# Patient Record
Sex: Female | Born: 1950
Health system: Southern US, Community
[De-identification: ages and names within clinical notes are randomized; demographics above are authoritative.]

## PROBLEM LIST (undated history)

## (undated) DIAGNOSIS — J449 Chronic obstructive pulmonary disease, unspecified: Secondary | ICD-10-CM

## (undated) DIAGNOSIS — I10 Essential (primary) hypertension: Secondary | ICD-10-CM

## (undated) DIAGNOSIS — K759 Inflammatory liver disease, unspecified: Secondary | ICD-10-CM

## (undated) DIAGNOSIS — Z860100 Personal history of colon polyps, unspecified: Secondary | ICD-10-CM

## (undated) DIAGNOSIS — Z9889 Other specified postprocedural states: Secondary | ICD-10-CM

## (undated) DIAGNOSIS — R112 Nausea with vomiting, unspecified: Secondary | ICD-10-CM

## (undated) DIAGNOSIS — E782 Mixed hyperlipidemia: Secondary | ICD-10-CM

## (undated) DIAGNOSIS — F419 Anxiety disorder, unspecified: Secondary | ICD-10-CM

## (undated) DIAGNOSIS — T7840XA Allergy, unspecified, initial encounter: Secondary | ICD-10-CM

## (undated) DIAGNOSIS — R3915 Urgency of urination: Secondary | ICD-10-CM

## (undated) DIAGNOSIS — Z9109 Other allergy status, other than to drugs and biological substances: Secondary | ICD-10-CM

## (undated) DIAGNOSIS — K589 Irritable bowel syndrome without diarrhea: Secondary | ICD-10-CM

## (undated) DIAGNOSIS — E119 Type 2 diabetes mellitus without complications: Secondary | ICD-10-CM

## (undated) DIAGNOSIS — M1990 Unspecified osteoarthritis, unspecified site: Secondary | ICD-10-CM

## (undated) DIAGNOSIS — E785 Hyperlipidemia, unspecified: Secondary | ICD-10-CM

## (undated) DIAGNOSIS — K219 Gastro-esophageal reflux disease without esophagitis: Secondary | ICD-10-CM

## (undated) HISTORY — PX: APPENDECTOMY: SHX54

## (undated) HISTORY — PX: DILATION AND CURETTAGE OF UTERUS: SHX78

## (undated) HISTORY — DX: Irritable bowel syndrome, unspecified: K58.9

## (undated) HISTORY — PX: PARTIAL HYSTERECTOMY: SHX80

## (undated) HISTORY — DX: Mixed hyperlipidemia: E78.2

## (undated) HISTORY — PX: COLONOSCOPY: SHX174

## (undated) HISTORY — DX: Personal history of colonic polyps: Z86.010

## (undated) HISTORY — DX: Gastro-esophageal reflux disease without esophagitis: K21.9

## (undated) HISTORY — DX: Personal history of colon polyps, unspecified: Z86.0100

## (undated) HISTORY — PX: BACK SURGERY: SHX140

## (undated) HISTORY — DX: Allergy, unspecified, initial encounter: T78.40XA

## (undated) HISTORY — PX: POLYPECTOMY: SHX149

---

## 1999-11-07 ENCOUNTER — Encounter: Payer: Self-pay | Admitting: Otolaryngology

## 1999-11-07 ENCOUNTER — Encounter: Admission: RE | Admit: 1999-11-07 | Discharge: 1999-11-07 | Payer: Self-pay | Admitting: Otolaryngology

## 2000-02-13 ENCOUNTER — Other Ambulatory Visit: Admission: RE | Admit: 2000-02-13 | Discharge: 2000-02-13 | Payer: Self-pay | Admitting: Gynecology

## 2000-10-09 ENCOUNTER — Encounter: Payer: Self-pay | Admitting: Internal Medicine

## 2000-10-09 ENCOUNTER — Encounter: Admission: RE | Admit: 2000-10-09 | Discharge: 2000-10-09 | Payer: Self-pay | Admitting: Internal Medicine

## 2001-01-23 ENCOUNTER — Ambulatory Visit (HOSPITAL_COMMUNITY): Admission: RE | Admit: 2001-01-23 | Discharge: 2001-01-23 | Payer: Self-pay | Admitting: Gastroenterology

## 2009-06-01 ENCOUNTER — Encounter (INDEPENDENT_AMBULATORY_CARE_PROVIDER_SITE_OTHER): Payer: Self-pay | Admitting: *Deleted

## 2009-06-01 ENCOUNTER — Other Ambulatory Visit: Admission: RE | Admit: 2009-06-01 | Discharge: 2009-06-01 | Payer: Self-pay | Admitting: Internal Medicine

## 2009-06-07 ENCOUNTER — Encounter: Admission: RE | Admit: 2009-06-07 | Discharge: 2009-06-07 | Payer: Self-pay | Admitting: Internal Medicine

## 2009-06-16 ENCOUNTER — Encounter: Admission: RE | Admit: 2009-06-16 | Discharge: 2009-06-16 | Payer: Self-pay | Admitting: Internal Medicine

## 2009-08-29 ENCOUNTER — Ambulatory Visit: Payer: Self-pay | Admitting: Gastroenterology

## 2010-01-27 ENCOUNTER — Encounter (INDEPENDENT_AMBULATORY_CARE_PROVIDER_SITE_OTHER): Payer: Self-pay | Admitting: *Deleted

## 2010-04-07 ENCOUNTER — Encounter (INDEPENDENT_AMBULATORY_CARE_PROVIDER_SITE_OTHER): Payer: Self-pay | Admitting: *Deleted

## 2010-05-30 ENCOUNTER — Encounter (INDEPENDENT_AMBULATORY_CARE_PROVIDER_SITE_OTHER): Payer: Self-pay | Admitting: *Deleted

## 2010-05-31 ENCOUNTER — Ambulatory Visit: Payer: Self-pay | Admitting: Gastroenterology

## 2010-06-12 ENCOUNTER — Ambulatory Visit: Payer: Self-pay | Admitting: Gastroenterology

## 2010-06-14 ENCOUNTER — Encounter: Payer: Self-pay | Admitting: Gastroenterology

## 2010-08-15 NOTE — Letter (Signed)
Summary: [facility] Instructions  [facility]  [address] [place], [place] [address]   Phone: [phone]  Fax: [phone]       [name]    29-Oct-1950    MRN: [id number]        Procedure Day Dorna Bloom:  Duanne Limerick 06/12/10     Arrival Time: 10:30am     Procedure Time: 11:30am     Location of Procedure:                    _X _  [facility] (4th Floor)                        PREPARATION FOR COLONOSCOPY WITH MOVIPREP   Starting 5 days prior to your procedure  [facility] 06/07/10  do not eat nuts, seeds, popcorn, corn, beans, peas,  salads, or any raw vegetables.  Do not take any fiber supplements (e.g. Metamucil, Citrucel, and Benefiber).  THE DAY BEFORE YOUR PROCEDURE         DATE: SUNDAY 06/11/10  1.  Drink clear liquids the entire day-NO SOLID FOOD  2.  Do not drink anything colored red or purple.  Avoid juices with pulp.  No orange juice.  3.  Drink at least 64 oz. (8 glasses) of fluid/clear liquids during the day to prevent dehydration and help the prep work efficiently.  CLEAR LIQUIDS INCLUDE: Water Jello Ice Popsicles Tea (sugar ok, no milk/cream) Powdered fruit flavored drinks Coffee (sugar ok, no milk/cream) Gatorade Juice: apple, white grape, white cranberry  Lemonade Clear bullion, consomm, broth Carbonated beverages (any kind) Strained chicken noodle soup Hard Candy                             4.  In the morning, mix first dose of MoviPrep solution:    Empty 1 Pouch A and 1 Pouch B into the disposable container    Add lukewarm drinking water to the top line of the container. Mix to dissolve    Refrigerate (mixed solution should be used within 24 hrs)  5.  Begin drinking the prep at 5:00 p.m. The MoviPrep container is divided by 4 marks.   Every 15 minutes drink the solution down to the next mark (approximately 8 oz) until the full liter is complete.   6.  Follow completed prep with 16 oz of clear liquid of your choice  (Nothing red or purple).  Continue to drink clear liquids until bedtime.  7.  Before going to bed, mix second dose of MoviPrep solution:    Empty 1 Pouch A and 1 Pouch B into the disposable container    Add lukewarm drinking water to the top line of the container. Mix to dissolve    Refrigerate  THE DAY OF YOUR PROCEDURE      DATE: MONDAY 06/12/10  Beginning at  6:30a.m. (5 hours before procedure):         1. Every 15 minutes, drink the solution down to the next mark (approx 8 oz) until the full liter is complete.  2. Follow completed prep with 16 oz. of clear liquid of your choice.    3. You may drink clear liquids until 9:30am  (2 HOURS BEFORE PROCEDURE).   MEDICATION INSTRUCTIONS  Unless otherwise instructed, you should take regular prescription medications with a small sip of water   as early as possible the morning of your  procedure.  Additional medication instructions: Hold blood pressure medicine the morning of procedure if it contains a diuretic.         OTHER INSTRUCTIONS  You will need a responsible adult at least 60 years of age to accompany you and drive you home.   This person must remain in the waiting room during your procedure.  Wear loose fitting clothing that is easily removed.  Leave jewelry and other valuables at home.  However, you may wish to bring a book to read or  an iPod/MP3 player to listen to music as you wait for your procedure to start.  Remove all body piercing jewelry and leave at home.  Total time from sign-in until discharge is approximately 2-3 hours.  You should go home directly after your procedure and rest.  You can resume normal activities the  day after your procedure.  The day of your procedure you should not:   Drive   Make legal decisions   Operate machinery   Drink alcohol   Return to work  You will receive specific instructions about eating, activities and medications before you leave.    The above  instructions have been reviewed and explained to me by  Wyona Almas RN  May 31, 2010 5:09 PM     I fully understand and can verbalize these instructions _____________________________ Date _________

## 2010-08-15 NOTE — Letter (Signed)
Summary: Pre Visit Letter Revised  [facility]  [address] [place], [place] [address]   Phone: [phone]  Fax: [phone]        04/07/2010 MRN: [id number] [facility] [address] [place], [place]  [address]             Procedure Date:  05-23-10   Welcome to the Gastroenterology Division at [facility].    You are scheduled to see a nurse for your pre-procedure visit on 05-09-10 at 10:00A.M. on the 3rd floor at [facility], [address]  We ask that you try to arrive at our office 15 minutes prior to your appointment time to allow for check-in.  Please take a minute to review the attached form.  If you answer "Yes" to one or more of the questions on the first page, we ask that you call the person listed at your earliest opportunity.  If you answer "No" to all of the questions, please complete the rest of the form and bring it to your appointment.    Your nurse visit will consist of discussing your medical and surgical history, your immediate family medical history, and your medications.   If you are unable to list all of your medications on the form, please bring the medication bottles to your appointment and we will list them.  We will need to be aware of both prescribed and over the counter drugs.  We will need to know exact dosage information as well.    Please be prepared to read and sign documents such as consent forms, a financial agreement, and acknowledgement forms.  If necessary, and with your consent, a friend or relative is welcome to sit-in on the nurse visit with you.  Please bring your insurance card so that we may make a copy of it.  If your insurance requires a referral to see a specialist, please bring your referral form from your primary care physician.  No co-pay is required for this nurse visit.     If you cannot keep your appointment, please call [phone] to cancel or reschedule prior to your appointment date.  This allows  [place] the opportunity to schedule an appointment for another patient in need of care.    Thank you for choosing [facility] for your medical needs.  We appreciate the opportunity to care for you.  Please visit [place] at our website  to learn more about our practice.  Sincerely, The Gastroenterology Division

## 2010-08-15 NOTE — Miscellaneous (Signed)
Summary: [facility] PV  Clinical Lists Changes   pt not sure if she is supposed to have colon now. She is self referral.  Last colonoscopy was 4 years ago in [facility].  According to pt  did not have polpys.  She will contact facility that performed last colonoscopy and have a report of procedure sent to PCP and have that office call to schedule colon if it is time for recall.

## 2010-08-15 NOTE — Miscellaneous (Signed)
Summary: [facility] Previsit/prep  Clinical Lists Changes  Medications: Added new medication of MOVIPREP 100 GM  SOLR (PEG-KCL-NACL-NASULF-NA ASC-C) As per prep instructions. - Signed Rx of MOVIPREP 100 GM  SOLR (PEG-KCL-NACL-NASULF-NA ASC-C) As per prep instructions.;  #1 x 0;  Signed;  Entered by: Wyona Almas RN;  Authorized by: Louis Meckel MD;  Method used: Electronically to [facility] [place] [facility] [address] [phone]*, [address], [place], [place]  [address], Ph: [phone], Fax: [phone] Observations: Added new observation of NKA: T (05/31/2010 16:16)    Prescriptions: MOVIPREP 100 GM  SOLR (PEG-KCL-NACL-NASULF-NA ASC-C) As per prep instructions.  #1 x 0   Entered by:   Wyona Almas RN   Authorized by:   Louis Meckel MD   Signed by:   Wyona Almas RN on 05/31/2010   Method used:   Electronically to        [facility] [address] [phone]* (retail)       [address]       [address]       [place], [place]  [address]       Ph: [phone]       Fax: [phone]   RxID:   [id number]

## 2010-08-15 NOTE — Procedures (Signed)
Summary: Colonoscopy  Patient: [name] Note: All result statuses are Final unless otherwise noted.  Tests: (1) Colonoscopy (COL)   COL Colonoscopy           DONE     [facility]     [address]     [place], [place]  [address]           OPERATIVE PROCEDURE REPORT           PATIENT:  [name], [name]  MR#:  [id number]     BIRTHDATE:  08/28/1950, 59 yrs. old  GENDER:  female           ENDOSCOPIST:  Barbette Hair. Arlyce Dice, MD     ASSISTANT:           PROCEDURE DATE:  06/12/2010     PROCEDURE:  Colonoscopy with snare polypectomy     ASA CLASS:  Class II     INDICATIONS:  Routine Risk Screening           MEDICATIONS:   Fentanyl 100 mcg IV, Versed 10 mg IV, Benadryl 12.5     mg IV           DESCRIPTION OF PROCEDURE:   After the risks benefits and     alternatives of the procedure were thoroughly explained, informed     consent was obtained.  Digital rectal exam was performed and     revealed no abnormalities.   The LB CF-[id number] endoscope     was introduced through the anus and advanced to the cecum, which     was identified by both the appendix and ileocecal valve, without     limitations.  The quality of the prep was good, using MoviPrep.     The instrument was then slowly withdrawn as the colon was fully     examined.     <<PROCEDUREIMAGES>>           FINDINGS:  Two polyps were found in the cecum (see image1 and     image4). 2 3mm sessile polyps - removed with cold polypectomy     snare and submitted to pathology  A sessile polyp was found in the     ascending colon. It was 3 mm in size. Polyp was snared without     cautery. Retrieval was successful (see image5). snare polyp  A     sessile polyp was found in the descending colon. It was 7 mm in     size. Polyp was snared without cautery. Retrieval was successful     (see image12). snare polyp  There were multiple polyps identified     and removed. in the sigmoid colon (see image16). Multiple (at     least  3) 1-61mm hyperplastic appearing polyps (normal overlying     mucosa) at 21-23cm from anus  This was otherwise a normal     examination of the colon (see image2, image8, image10, image15,     image18, and image19).   Retroflexed views in the rectum revealed     no abnormalities.    The scope was then withdrawn from the patient     and the procedure terminated.           COMPLICATIONS:  None           ENDOSCOPIC IMPRESSION:     1) Two polyps in the cecum     2) 3 mm sessile polyp in the ascending colon     3) 7  mm sessile polyp in the descending colon     4) Polyps, multiple hyperplastic in the sigmoid colon     5) Otherwise normal examination     RECOMMENDATIONS:     1) Colonoscopy     2) Sedation with MAC for future procedures           REPEAT EXAM:  In 3 year(s) for Colonoscopy (due to multiplicity of     polyps)           ______________________________     Barbette Hair. Arlyce Dice, MD           cc: Dr. Lucky Cowboy           n.     eSIGNED:   Barbette Hair. Karlissa Aron at 06/12/2010 12:29 PM           Page 2 of 3   [place], [place], [id number]  Note: An exclamation mark (!) indicates a result that was not dispersed into the flowsheet. Document Creation Date: 06/12/2010 12:30 PM _______________________________________________________________________  (1) Order result status: Final Collection or observation date-time: 06/12/2010 12:21 Requested date-time:  Receipt date-time:  Reported date-time:  Referring Physician:   Ordering Physician: Melvia Heaps [phone]) Specimen Source:  Source: Launa Grill Order Number: [id number] Lab site:   Appended Document: Colonoscopy     Procedures Next Due Date:    Colonoscopy: 05/2013

## 2010-08-15 NOTE — Letter (Signed)
Summary: Patient Notice- Polyp Results  [facility]  [address] [place], [place] [address]   Phone: [phone]  Fax: [phone]        June 14, 2010 MRN: [id number]    [facility] [address] [place], [place]  [address]    Dear Ms. Busbee,  I am pleased to inform you that the colon polyp(s) removed during your recent colonoscopy was (were) found to be benign (no cancer detected) upon pathologic examination.  I recommend you have a repeat colonoscopy examination in 3_ years to look for recurrent polyps, as having colon polyps increases your risk for having recurrent polyps or even colon cancer in the future.  Should you develop new or worsening symptoms of abdominal pain, bowel habit changes or bleeding from the rectum or bowels, please schedule an evaluation with either your primary care physician or with me.  Additional information/recommendations:  __ No further action with gastroenterology is needed at this time. Please      follow-up with your primary care physician for your other healthcare      needs.  __ Please call [phone] to schedule a return visit to review your      situation.  __ Please keep your follow-up visit as already scheduled.  _x_ Continue treatment plan as outlined the day of your exam.  Please call us if you are having persistent problems or have questions about your condition that have not been fully answered at this time.  Sincerely,  Louis Meckel MD  This letter has been electronically signed by your physician.  Appended Document: Patient Notice- Polyp Results Letter mailed

## 2010-08-15 NOTE — Letter (Signed)
Summary: [facility] Referral (unable to schedule) Notification  [facility]  [address] [place] [address] [place], [place] [address]   Phone: [phone]  Fax: [phone]      January 27, 2010 [name] [birth year] MRN: [id number]   [facility] [address] [place], [place]  [address]   Dear [name]:   Thank you for your kind referral of the above patient. We have attempted to schedule the recommended COLONOSCOPY but have been unable to schedule because:  _X_ The patient was not available by phone and/or has not returned our calls.  __ The patient declined to schedule the procedure at this time.  We appreciate the referral and hope that we will have the opportunity to treat this patient in the future.    Sincerely,   [facility]  Vania Rea. Jarold Motto M.D. Hedwig Morton. Juanda Chance M.D. Venita Lick. Russella Dar M.D. Wilhemina Bonito. Marina Goodell M.D. Barbette Hair. Arlyce Dice M.D. Iva Boop M.D. Cheron Every.D.

## 2010-11-09 ENCOUNTER — Other Ambulatory Visit (HOSPITAL_COMMUNITY): Payer: Self-pay | Admitting: Internal Medicine

## 2010-11-09 ENCOUNTER — Ambulatory Visit (HOSPITAL_COMMUNITY)
Admission: RE | Admit: 2010-11-09 | Discharge: 2010-11-09 | Disposition: A | Discharge status: Home / Self Care | Payer: 59 | Source: Ambulatory Visit | Attending: Internal Medicine | Admitting: Internal Medicine

## 2010-11-09 DIAGNOSIS — M549 Dorsalgia, unspecified: Secondary | ICD-10-CM

## 2010-11-09 DIAGNOSIS — M545 Low back pain, unspecified: Secondary | ICD-10-CM | POA: Insufficient documentation

## 2010-11-09 DIAGNOSIS — R209 Unspecified disturbances of skin sensation: Secondary | ICD-10-CM | POA: Insufficient documentation

## 2010-12-01 NOTE — Procedures (Signed)
[place]  Patient:    [name], [name]                     MRN: [id number] Proc. Date: 01/23/01 Adm. Date:  [id number] Attending:  Charna Elizabeth CC:         Velna Hatchet, M.D.   Procedure Report  REFERRING PHYSICIAN:  Velna Hatchet, M.D.  PROCEDURE PERFORMED:  Esophagogastroduodenoscopy.  ENDOSCOPIST:  Anselmo Rod, M.D.  INSTRUMENT USED:  Olympus video panendoscope.  INDICATION FOR PROCEDURE: Epigastric pain in a 60 year old female; rule out peptic ulcer disease, esophagitis, gastritis.  PREPROCEDURE PREPARATION:  Informed consent was procured from the patient. The patient was fasted for eight hours prior to the procedure.  PREPROCEDURE PHYSICAL:  VITAL SIGNS:  Stable.  NECK:  Supple.  CHEST:  Clear to auscultation.  S1, S2 regular.  No murmurs, rubs or gallops.  ABDOMEN:  Soft with normal bowel sounds.  DESCRIPTION OF PROCEDURE:  The patient was placed in the left lateral decubitus position and sedated with 70 mg of Demerol and 6 mg of Versed intravenously.  Once the patient was adequately sedated and maintained on low-flow oxygen and continuous cardiac monitoring, the Olympus video panendoscope was advanced through the mouthpiece, over the tongue, into the esophagus under direct vision.  The patients entire esophagus appeared normal, without evidence of ring, stricture, masses, lesions, esophagitis or Barretts mucosa.  The scope was then advanced in to the stomach.  There was mild, diffuse gastritis.  At the gastric mucosa no erosions, ulcerations, masses or polyps were seen.  The proximal small bowel appeared normal.  IMPRESSION: 1. Normal-appearing esophagus and proximal small bowel. 2. Moderate, diffuse gastritis.  RECOMMENDATIONS: DD:  01/23/01 TD:  01/24/01 Job: [id number]

## 2010-12-01 NOTE — Procedures (Signed)
[place]  Patient:    [name], RADZIEWICZ                     MRN: [id number] Proc. Date: 01/23/01 Adm. Date:  [id number] Attending:  Charna Elizabeth CC:         Velna Hatchet, M.D.   Procedure Report  DATE OF BIRTH:  07/29/1950  PROCEDURE PERFORMED:  Esophagogastroduodenoscopy.  ENDOSCOPIST:  Anselmo Rod, M.D.  INSTRUMENT USED:  Olympus video panendoscope.  INDICATIONS FOR PROCEDURE:  Epigastric pain in a 60 year old female, rule out peptic ulcer disease, esophagitis, gastritis, etc.  PREPROCEDURE PREPARATION:  Informed consent was procured from the patient. The patient was fasted for eight hours prior to the procedure.  PREPROCEDURE PHYSICAL:  VITAL SIGNS:  The patient had stable vital signs.  NECK:  Supple.  CHEST:  Clear to auscultation, S1 and S2 regular.  ABDOMEN:  Soft with normal bowel sounds.  DESCRIPTION OF PROCEDURE:  The patient was placed in the left lateral decubitus position and sedated with 75 mg of Demerol and 6 mg of Versed intravenously.  Once the patient was adequately sedated, maintained on low flow oxygen and continuous cardiac monitoring, the Olympus video panendoscope was advanced through the mouth piece over the tongue into the esophagus under direct vision.  The entire esophagus appeared normal without evidence of ring stricture, masses, lesions, esophagitis or Barretts mucosa.  The scope was then advanced into the stomach.  There was mild diffuse gastritis throughout the gastric mucosa.  The proximal small bowel appeared normal.  No ulcers, erosions, masses, or polyps were seen.  IMPRESSION: 1. Mild diffuse gastritis, no ulcers or masses seen. 2. Normal-appearing esophagus and proximal small bowel.  RECOMMENDATIONS: 1. Protonix is to be continued.  This has been called into her local pharmacy. 2. Avoid all nonsteroidals. 3. Outpatient follow up in the next 2-4 weeks. DD:  01/23/01 TD:   01/24/01 Job: [id number]

## 2011-05-01 ENCOUNTER — Ambulatory Visit (HOSPITAL_COMMUNITY)
Admission: RE | Admit: 2011-05-01 | Discharge: 2011-05-01 | Disposition: A | Discharge status: Home / Self Care | Payer: 59 | Source: Ambulatory Visit | Attending: Internal Medicine | Admitting: Internal Medicine

## 2011-05-01 ENCOUNTER — Other Ambulatory Visit (HOSPITAL_COMMUNITY): Payer: Self-pay | Admitting: Internal Medicine

## 2011-05-01 DIAGNOSIS — R079 Chest pain, unspecified: Secondary | ICD-10-CM | POA: Insufficient documentation

## 2011-05-01 DIAGNOSIS — J449 Chronic obstructive pulmonary disease, unspecified: Secondary | ICD-10-CM | POA: Insufficient documentation

## 2011-05-01 DIAGNOSIS — R05 Cough: Secondary | ICD-10-CM

## 2011-05-01 DIAGNOSIS — R059 Cough, unspecified: Secondary | ICD-10-CM | POA: Insufficient documentation

## 2011-05-01 DIAGNOSIS — F17200 Nicotine dependence, unspecified, uncomplicated: Secondary | ICD-10-CM | POA: Insufficient documentation

## 2011-05-01 DIAGNOSIS — I10 Essential (primary) hypertension: Secondary | ICD-10-CM | POA: Insufficient documentation

## 2011-05-01 DIAGNOSIS — J4489 Other specified chronic obstructive pulmonary disease: Secondary | ICD-10-CM | POA: Insufficient documentation

## 2012-01-15 ENCOUNTER — Other Ambulatory Visit: Payer: Self-pay | Admitting: Orthopedic Surgery

## 2012-01-22 ENCOUNTER — Other Ambulatory Visit: Payer: Self-pay | Admitting: Internal Medicine

## 2012-01-22 DIAGNOSIS — E782 Mixed hyperlipidemia: Secondary | ICD-10-CM

## 2012-01-22 DIAGNOSIS — I10 Essential (primary) hypertension: Secondary | ICD-10-CM

## 2012-01-23 ENCOUNTER — Encounter (HOSPITAL_COMMUNITY): Payer: Self-pay | Admitting: Respiratory Therapy

## 2012-01-25 ENCOUNTER — Ambulatory Visit
Admission: RE | Admit: 2012-01-25 | Discharge: 2012-01-25 | Disposition: A | Discharge status: Home / Self Care | Payer: 59 | Source: Ambulatory Visit | Attending: Internal Medicine | Admitting: Internal Medicine

## 2012-01-25 DIAGNOSIS — I10 Essential (primary) hypertension: Secondary | ICD-10-CM

## 2012-01-25 DIAGNOSIS — E782 Mixed hyperlipidemia: Secondary | ICD-10-CM

## 2012-02-01 ENCOUNTER — Encounter (HOSPITAL_COMMUNITY)
Admission: RE | Admit: 2012-02-01 | Discharge: 2012-02-01 | Disposition: A | Discharge status: Home / Self Care | Payer: 59 | Source: Ambulatory Visit | Attending: Orthopedic Surgery | Admitting: Orthopedic Surgery

## 2012-02-01 ENCOUNTER — Encounter (HOSPITAL_COMMUNITY): Payer: Self-pay

## 2012-02-01 HISTORY — DX: Hyperlipidemia, unspecified: E78.5

## 2012-02-01 HISTORY — DX: Anxiety disorder, unspecified: F41.9

## 2012-02-01 HISTORY — DX: Essential (primary) hypertension: I10

## 2012-02-01 HISTORY — DX: Unspecified osteoarthritis, unspecified site: M19.90

## 2012-02-01 LAB — CBC
HCT: 37 % (ref 36.0–46.0)
Hemoglobin: 12.8 g/dL (ref 12.0–15.0)
RBC: 4.73 MIL/uL (ref 3.87–5.11)
RDW: 14.3 % (ref 11.5–15.5)
WBC: 5.8 10*3/uL (ref 4.0–10.5)

## 2012-02-01 LAB — DIFFERENTIAL
Basophils Relative: 0 % (ref 0–1)
Eosinophils Absolute: 0.3 10*3/uL (ref 0.0–0.7)
Monocytes Relative: 8 % (ref 3–12)
Neutrophils Relative %: 43 % (ref 43–77)

## 2012-02-01 LAB — COMPREHENSIVE METABOLIC PANEL
Albumin: 4 g/dL (ref 3.5–5.2)
Alkaline Phosphatase: 71 U/L (ref 39–117)
BUN: 14 mg/dL (ref 6–23)
CO2: 29 mEq/L (ref 19–32)
Chloride: 100 mEq/L (ref 96–112)
Glucose, Bld: 83 mg/dL (ref 70–99)
Potassium: 4.2 mEq/L (ref 3.5–5.1)
Total Bilirubin: 0.3 mg/dL (ref 0.3–1.2)

## 2012-02-01 LAB — URINALYSIS, ROUTINE W REFLEX MICROSCOPIC
Leukocytes, UA: NEGATIVE
Nitrite: NEGATIVE
Specific Gravity, Urine: 1.013 (ref 1.005–1.030)
pH: 7 (ref 5.0–8.0)

## 2012-02-01 LAB — APTT: aPTT: 28 seconds (ref 24–37)

## 2012-02-01 LAB — ABO/RH: ABO/RH(D): O POS

## 2012-02-01 LAB — PROTIME-INR: INR: 0.94 (ref 0.00–1.49)

## 2012-02-01 NOTE — Pre-Procedure Instructions (Addendum)
20 [name]  02/01/2012   Your procedure is scheduled on:  02/06/2012 [facility]  Report to [facility] at 0630  AM.  Call this number if you have problems the morning of surgery: [phone]   Remember:   Do not eat food:After Midnight.  May have  liquids:until Midnight .    Take these medicines the morning of surgery with A SIP OF WATER: BISOPROLOL  WELLBUTRIN        STOP Aspirin, Vitamin D, Mobic, Multiple vitamins after today  Do not wear jewelry, make-up or nail polish.  Do not wear lotions, powders, or perfumes. You may wear deodorant.  Do not shave 48 hours prior to surgery. Men may shave face and neck.  Do not bring valuables to the hospital.  Contacts, dentures or bridgework may not be worn into surgery.  Leave suitcase in the car. After surgery it may be brought to your room.  For patients admitted to the hospital, checkout time is 11:00 AM the day of discharge.   Patients discharged the day of surgery will not be allowed to drive home.  Name and phone number of your driver:   Special Instructions: CHG Shower Use Special Wash: 1/2 bottle night before surgery and 1/2 bottle morning of surgery.   Please read over the following fact sheets that you were given: Pain Booklet, Coughing and Deep Breathing, Blood Transfusion Information, Lab Information, MRSA Information and Surgical Site Infection Prevention

## 2012-02-05 MED ORDER — CLINDAMYCIN PHOSPHATE 900 MG/50ML IV SOLN
900.0000 mg | INTRAVENOUS | Status: AC
  Administered 2012-02-06: 900 mg via INTRAVENOUS
  Filled 2012-02-05: qty 50

## 2012-02-06 ENCOUNTER — Encounter (HOSPITAL_COMMUNITY)
Admission: RE | Disposition: A | Discharge status: Home / Self Care | Payer: Self-pay | Source: Ambulatory Visit | Attending: Orthopedic Surgery

## 2012-02-06 ENCOUNTER — Inpatient Hospital Stay (HOSPITAL_COMMUNITY)
Admission: RE | Admit: 2012-02-06 | Discharge: 2012-02-08 | DRG: 460 | Disposition: A | Discharge status: Home / Self Care | Payer: 59 | Source: Ambulatory Visit | Attending: Orthopedic Surgery | Admitting: Orthopedic Surgery

## 2012-02-06 ENCOUNTER — Ambulatory Visit (HOSPITAL_COMMUNITY): Payer: 59

## 2012-02-06 ENCOUNTER — Encounter (HOSPITAL_COMMUNITY): Payer: Self-pay | Admitting: Anesthesiology

## 2012-02-06 ENCOUNTER — Ambulatory Visit (HOSPITAL_COMMUNITY): Payer: 59 | Admitting: Anesthesiology

## 2012-02-06 DIAGNOSIS — Z01812 Encounter for preprocedural laboratory examination: Secondary | ICD-10-CM

## 2012-02-06 DIAGNOSIS — F411 Generalized anxiety disorder: Secondary | ICD-10-CM | POA: Diagnosis present

## 2012-02-06 DIAGNOSIS — E785 Hyperlipidemia, unspecified: Secondary | ICD-10-CM | POA: Diagnosis present

## 2012-02-06 DIAGNOSIS — F17200 Nicotine dependence, unspecified, uncomplicated: Secondary | ICD-10-CM | POA: Diagnosis present

## 2012-02-06 DIAGNOSIS — Z9089 Acquired absence of other organs: Secondary | ICD-10-CM

## 2012-02-06 DIAGNOSIS — M5410 Radiculopathy, site unspecified: Secondary | ICD-10-CM

## 2012-02-06 DIAGNOSIS — I10 Essential (primary) hypertension: Secondary | ICD-10-CM | POA: Diagnosis present

## 2012-02-06 DIAGNOSIS — IMO0002 Reserved for concepts with insufficient information to code with codable children: Principal | ICD-10-CM | POA: Diagnosis present

## 2012-02-06 HISTORY — PX: LUMBAR FUSION: SHX111

## 2012-02-06 LAB — CBC
HCT: 38.5 % (ref 36.0–46.0)
Hemoglobin: 13.5 g/dL (ref 12.0–15.0)
MCH: 27.9 pg (ref 26.0–34.0)
MCHC: 35.1 g/dL (ref 30.0–36.0)
MCV: 79.5 fL (ref 78.0–100.0)
RDW: 15.4 % (ref 11.5–15.5)

## 2012-02-06 SURGERY — POSTERIOR LUMBAR FUSION 1 LEVEL
Anesthesia: General | Site: Spine Lumbar | Laterality: Right | Wound class: Clean

## 2012-02-06 MED ORDER — ACETAMINOPHEN 650 MG RE SUPP: 650.0000 mg | RECTAL | Status: DC | PRN

## 2012-02-06 MED ORDER — CEFAZOLIN SODIUM 1-5 GM-% IV SOLN
1.0000 g | Freq: Three times a day (TID) | INTRAVENOUS | Status: AC
  Administered 2012-02-06 – 2012-02-07 (×2): 1 g via INTRAVENOUS
  Filled 2012-02-06 (×2): qty 50

## 2012-02-06 MED ORDER — SODIUM CHLORIDE 0.9 % IV SOLN: 250.0000 mL | INTRAVENOUS | Status: DC

## 2012-02-06 MED ORDER — POVIDONE-IODINE 7.5 % EX SOLN: Freq: Once | CUTANEOUS | Status: DC

## 2012-02-06 MED ORDER — NALOXONE HCL 0.4 MG/ML IJ SOLN: 0.4000 mg | INTRAMUSCULAR | Status: DC | PRN

## 2012-02-06 MED ORDER — DIPHENHYDRAMINE HCL 12.5 MG/5ML PO ELIX: 12.5000 mg | ORAL_SOLUTION | Freq: Four times a day (QID) | ORAL | Status: DC | PRN

## 2012-02-06 MED ORDER — DIAZEPAM 5 MG PO TABS: 5.0000 mg | ORAL_TABLET | Freq: Four times a day (QID) | ORAL | Status: DC | PRN

## 2012-02-06 MED ORDER — HYDROMORPHONE HCL PF 1 MG/ML IJ SOLN: 0.2500 mg | INTRAMUSCULAR | Status: DC | PRN

## 2012-02-06 MED ORDER — MENTHOL 3 MG MT LOZG: 1.0000 | LOZENGE | OROMUCOSAL | Status: DC | PRN

## 2012-02-06 MED ORDER — SODIUM CHLORIDE 0.9 % IJ SOLN: 3.0000 mL | INTRAMUSCULAR | Status: DC | PRN

## 2012-02-06 MED ORDER — ROCURONIUM BROMIDE 100 MG/10ML IV SOLN
INTRAVENOUS | Status: DC | PRN
  Administered 2012-02-06: 5 mg via INTRAVENOUS
  Administered 2012-02-06: 10 mg via INTRAVENOUS
  Administered 2012-02-06: 15 mg via INTRAVENOUS
  Administered 2012-02-06: 50 mg via INTRAVENOUS

## 2012-02-06 MED ORDER — ENALAPRIL MALEATE 20 MG PO TABS
20.0000 mg | ORAL_TABLET | Freq: Every day | ORAL | Status: DC
  Administered 2012-02-08: 20 mg via ORAL
  Filled 2012-02-06 (×2): qty 1

## 2012-02-06 MED ORDER — ZOLPIDEM TARTRATE 5 MG PO TABS: 5.0000 mg | ORAL_TABLET | Freq: Every evening | ORAL | Status: DC | PRN

## 2012-02-06 MED ORDER — MORPHINE SULFATE (PF) 1 MG/ML IV SOLN
INTRAVENOUS | Status: DC
  Administered 2012-02-06: 16:00:00 via INTRAVENOUS
  Administered 2012-02-06: 4.5 mg via INTRAVENOUS
  Administered 2012-02-06: 1.5 mg via INTRAVENOUS
  Administered 2012-02-07: 2.4 mg via INTRAVENOUS
  Administered 2012-02-07: 3 mg via INTRAVENOUS
  Administered 2012-02-07: 06:00:00 via INTRAVENOUS
  Filled 2012-02-06: qty 25
  Filled 2012-02-06: qty 125

## 2012-02-06 MED ORDER — POTASSIUM CHLORIDE IN NACL 20-0.9 MEQ/L-% IV SOLN
INTRAVENOUS | Status: DC
  Administered 2012-02-06: 19:00:00 via INTRAVENOUS
  Filled 2012-02-06 (×6): qty 1000

## 2012-02-06 MED ORDER — LIDOCAINE HCL (CARDIAC) 20 MG/ML IV SOLN
INTRAVENOUS | Status: DC | PRN
  Administered 2012-02-06: 100 mg via INTRAVENOUS

## 2012-02-06 MED ORDER — 0.9 % SODIUM CHLORIDE (POUR BTL) OPTIME
TOPICAL | Status: DC | PRN
  Administered 2012-02-06: 1000 mL

## 2012-02-06 MED ORDER — LACTATED RINGERS IV SOLN
INTRAVENOUS | Status: DC | PRN
  Administered 2012-02-06 (×3): via INTRAVENOUS

## 2012-02-06 MED ORDER — PHENOL 1.4 % MT LIQD: 1.0000 | OROMUCOSAL | Status: DC | PRN

## 2012-02-06 MED ORDER — ONDANSETRON HCL 4 MG/2ML IJ SOLN: 4.0000 mg | Freq: Once | INTRAMUSCULAR | Status: DC | PRN

## 2012-02-06 MED ORDER — PROPOFOL 10 MG/ML IV EMUL
INTRAVENOUS | Status: DC | PRN
  Administered 2012-02-06: 120 mg via INTRAVENOUS

## 2012-02-06 MED ORDER — THROMBIN 20000 UNITS EX SOLR
CUTANEOUS | Status: AC
  Filled 2012-02-06: qty 20000

## 2012-02-06 MED ORDER — OXYCODONE-ACETAMINOPHEN 5-325 MG PO TABS
1.0000 | ORAL_TABLET | ORAL | Status: DC | PRN
  Administered 2012-02-07 (×3): 1 via ORAL
  Administered 2012-02-08 (×2): 2 via ORAL
  Filled 2012-02-06 (×2): qty 2
  Filled 2012-02-06 (×3): qty 1

## 2012-02-06 MED ORDER — ADULT MULTIVITAMIN W/MINERALS CH
1.0000 | ORAL_TABLET | Freq: Every day | ORAL | Status: DC
  Administered 2012-02-07 – 2012-02-08 (×2): 1 via ORAL
  Filled 2012-02-06 (×2): qty 1

## 2012-02-06 MED ORDER — EPHEDRINE SULFATE 50 MG/ML IJ SOLN
INTRAMUSCULAR | Status: DC | PRN
  Administered 2012-02-06 (×2): 10 mg via INTRAVENOUS

## 2012-02-06 MED ORDER — OXYCODONE HCL 10 MG PO TB12: 20.0000 mg | ORAL_TABLET | Freq: Two times a day (BID) | ORAL | Status: DC

## 2012-02-06 MED ORDER — DARIFENACIN HYDROBROMIDE ER 15 MG PO TB24
15.0000 mg | ORAL_TABLET | Freq: Every day | ORAL | Status: DC
  Administered 2012-02-06 – 2012-02-08 (×3): 15 mg via ORAL
  Filled 2012-02-06 (×3): qty 1

## 2012-02-06 MED ORDER — SODIUM CHLORIDE 0.9 % IJ SOLN
3.0000 mL | Freq: Two times a day (BID) | INTRAMUSCULAR | Status: DC
Start: 2012-02-06 — End: 2012-02-08

## 2012-02-06 MED ORDER — BUPIVACAINE-EPINEPHRINE PF 0.25-1:200000 % IJ SOLN
INTRAMUSCULAR | Status: DC | PRN
  Administered 2012-02-06: 7 mL

## 2012-02-06 MED ORDER — SODIUM CHLORIDE 0.9 % IJ SOLN: 9.0000 mL | INTRAMUSCULAR | Status: DC | PRN

## 2012-02-06 MED ORDER — DOCUSATE SODIUM 100 MG PO CAPS
100.0000 mg | ORAL_CAPSULE | Freq: Two times a day (BID) | ORAL | Status: DC
  Administered 2012-02-06 – 2012-02-08 (×4): 100 mg via ORAL
  Filled 2012-02-06 (×5): qty 1

## 2012-02-06 MED ORDER — MIDAZOLAM HCL 5 MG/5ML IJ SOLN
INTRAMUSCULAR | Status: DC | PRN
  Administered 2012-02-06: 2 mg via INTRAVENOUS

## 2012-02-06 MED ORDER — BUPROPION HCL ER (XL) 300 MG PO TB24
300.0000 mg | ORAL_TABLET | Freq: Every day | ORAL | Status: DC
  Administered 2012-02-07 – 2012-02-08 (×2): 300 mg via ORAL
  Filled 2012-02-06 (×2): qty 1

## 2012-02-06 MED ORDER — SENNA 8.6 MG PO TABS
1.0000 | ORAL_TABLET | Freq: Two times a day (BID) | ORAL | Status: DC
  Administered 2012-02-06 – 2012-02-08 (×4): 8.6 mg via ORAL
  Filled 2012-02-06 (×5): qty 1

## 2012-02-06 MED ORDER — DIPHENHYDRAMINE HCL 50 MG/ML IJ SOLN: 12.5000 mg | Freq: Four times a day (QID) | INTRAMUSCULAR | Status: DC | PRN

## 2012-02-06 MED ORDER — COLESEVELAM HCL 3.75 G PO PACK
3.7500 g | PACK | Freq: Every day | ORAL | Status: DC
  Administered 2012-02-07: 3.75 g via ORAL

## 2012-02-06 MED ORDER — BUPIVACAINE-EPINEPHRINE PF 0.25-1:200000 % IJ SOLN
INTRAMUSCULAR | Status: AC
  Filled 2012-02-06: qty 30

## 2012-02-06 MED ORDER — FENTANYL CITRATE 0.05 MG/ML IJ SOLN
INTRAMUSCULAR | Status: DC | PRN
  Administered 2012-02-06 (×3): 50 ug via INTRAVENOUS
  Administered 2012-02-06: 100 ug via INTRAVENOUS
  Administered 2012-02-06: 50 ug via INTRAVENOUS
  Administered 2012-02-06: 150 ug via INTRAVENOUS

## 2012-02-06 MED ORDER — ACETAMINOPHEN 325 MG PO TABS: 650.0000 mg | ORAL_TABLET | ORAL | Status: DC | PRN

## 2012-02-06 MED ORDER — SODIUM CHLORIDE 0.9 % IV SOLN
INTRAVENOUS | Status: DC | PRN
  Administered 2012-02-06: 12:00:00 via INTRAVENOUS

## 2012-02-06 MED ORDER — HYDROMORPHONE HCL PF 1 MG/ML IJ SOLN
INTRAMUSCULAR | Status: AC
  Filled 2012-02-06: qty 1

## 2012-02-06 MED ORDER — ONDANSETRON HCL 4 MG/2ML IJ SOLN
4.0000 mg | INTRAMUSCULAR | Status: DC | PRN
  Administered 2012-02-07: 4 mg via INTRAVENOUS
  Filled 2012-02-06: qty 2

## 2012-02-06 MED ORDER — PROPOFOL 10 MG/ML IV EMUL
INTRAVENOUS | Status: DC | PRN
  Administered 2012-02-06: 100 ug/kg/min via INTRAVENOUS

## 2012-02-06 MED ORDER — MORPHINE SULFATE 2 MG/ML IJ SOLN: 2.0000 mg | INTRAMUSCULAR | Status: DC | PRN

## 2012-02-06 MED ORDER — ALUM & MAG HYDROXIDE-SIMETH 200-200-20 MG/5ML PO SUSP
30.0000 mL | Freq: Four times a day (QID) | ORAL | Status: DC | PRN
  Administered 2012-02-07: 30 mL via ORAL
  Filled 2012-02-06: qty 30

## 2012-02-06 MED ORDER — BISOPROLOL FUMARATE 10 MG PO TABS
10.0000 mg | ORAL_TABLET | Freq: Every day | ORAL | Status: DC
  Administered 2012-02-08: 10 mg via ORAL
  Filled 2012-02-06 (×2): qty 1

## 2012-02-06 MED ORDER — DEXAMETHASONE SODIUM PHOSPHATE 4 MG/ML IJ SOLN
INTRAMUSCULAR | Status: DC | PRN
  Administered 2012-02-06: 10 mg via INTRAVENOUS

## 2012-02-06 MED ORDER — ONDANSETRON HCL 4 MG/2ML IJ SOLN
INTRAMUSCULAR | Status: DC | PRN
  Administered 2012-02-06: 4 mg via INTRAVENOUS

## 2012-02-06 MED ORDER — ONDANSETRON HCL 4 MG/2ML IJ SOLN
4.0000 mg | Freq: Four times a day (QID) | INTRAMUSCULAR | Status: DC | PRN
Start: 2012-02-06 — End: 2012-02-07

## 2012-02-06 MED ORDER — THROMBIN 20000 UNITS EX SOLR
CUTANEOUS | Status: AC
  Filled 2012-02-06: qty 40000

## 2012-02-06 MED ORDER — THROMBIN 20000 UNITS EX KIT
PACK | CUTANEOUS | Status: DC | PRN
  Administered 2012-02-06: 10:00:00 via TOPICAL

## 2012-02-06 MED ORDER — MORPHINE SULFATE (PF) 1 MG/ML IV SOLN
INTRAVENOUS | Status: AC
  Filled 2012-02-06: qty 25

## 2012-02-06 SURGICAL SUPPLY — 75 items
APL SKNCLS STERI-STRIP NONHPOA (GAUZE/BANDAGES/DRESSINGS) ×1
BENZOIN TINCTURE PRP APPL 2/3 (GAUZE/BANDAGES/DRESSINGS) ×2 IMPLANT
BLADE SURG ROTATE 9660 (MISCELLANEOUS) IMPLANT
BUR ROUND PRECISION 4.0 (BURR) ×2 IMPLANT
CAGE CONCORDE BULLET 9X8X27 (Cage) ×2 IMPLANT
CAGE SPNL PRLL BLT NOSE 27X9X8 (Cage) ×1 IMPLANT
CARTRIDGE OIL MAESTRO DRILL (MISCELLANEOUS) ×1 IMPLANT
CLOTH BEACON ORANGE TIMEOUT ST (SAFETY) ×2 IMPLANT
CLSR STERI-STRIP ANTIMIC 1/2X4 (GAUZE/BANDAGES/DRESSINGS) ×2 IMPLANT
CONT SPEC STER OR (MISCELLANEOUS) ×2 IMPLANT
CORDS BIPOLAR (ELECTRODE) ×2 IMPLANT
COVER SURGICAL LIGHT HANDLE (MISCELLANEOUS) ×2 IMPLANT
DIFFUSER DRILL AIR PNEUMATIC (MISCELLANEOUS) ×2 IMPLANT
DRAIN CHANNEL 15F RND FF W/TCR (WOUND CARE) ×2 IMPLANT
DRAPE C-ARM 42X72 X-RAY (DRAPES) ×2 IMPLANT
DRAPE ORTHO SPLIT 77X108 STRL (DRAPES) ×2
DRAPE POUCH INSTRU U-SHP 10X18 (DRAPES) ×2 IMPLANT
DRAPE SURG 17X23 STRL (DRAPES) ×6 IMPLANT
DRAPE SURG ORHT 6 SPLT 77X108 (DRAPES) ×1 IMPLANT
DURAPREP 26ML APPLICATOR (WOUND CARE) ×2 IMPLANT
ELECT BLADE 4.0 EZ CLEAN MEGAD (MISCELLANEOUS) ×2
ELECT CAUTERY BLADE 6.4 (BLADE) ×2 IMPLANT
ELECT REM PT RETURN 9FT ADLT (ELECTROSURGICAL) ×2
ELECTRODE BLDE 4.0 EZ CLN MEGD (MISCELLANEOUS) ×1 IMPLANT
ELECTRODE REM PT RTRN 9FT ADLT (ELECTROSURGICAL) ×1 IMPLANT
EVACUATOR SILICONE 100CC (DRAIN) ×2 IMPLANT
GAUZE SPONGE 4X4 16PLY XRAY LF (GAUZE/BANDAGES/DRESSINGS) ×8 IMPLANT
GLOVE BIO SURGEON STRL SZ8 (GLOVE) ×4 IMPLANT
GLOVE BIOGEL PI IND STRL 8 (GLOVE) ×1 IMPLANT
GLOVE BIOGEL PI INDICATOR 8 (GLOVE) ×1
GOWN PREVENTION PLUS XLARGE (GOWN DISPOSABLE) ×2 IMPLANT
GOWN STRL NON-REIN LRG LVL3 (GOWN DISPOSABLE) ×4 IMPLANT
IV CATH 14GX2 1/4 (CATHETERS) ×2 IMPLANT
KIT BASIN OR (CUSTOM PROCEDURE TRAY) ×2 IMPLANT
KIT POSITION SURG JACKSON T1 (MISCELLANEOUS) IMPLANT
KIT ROOM TURNOVER OR (KITS) ×2 IMPLANT
MARKER SKIN DUAL TIP RULER LAB (MISCELLANEOUS) ×2 IMPLANT
MIX DBX 20CC MTF (Putty) ×2 IMPLANT
NEEDLE BONE MARROW 8GX6 FENEST (NEEDLE) IMPLANT
NEEDLE HYPO 25GX1X1/2 BEV (NEEDLE) ×2 IMPLANT
NEEDLE SPNL 18GX3.5 QUINCKE PK (NEEDLE) ×4 IMPLANT
NS IRRIG 1000ML POUR BTL (IV SOLUTION) ×2 IMPLANT
OIL CARTRIDGE MAESTRO DRILL (MISCELLANEOUS) ×2
PACK LAMINECTOMY ORTHO (CUSTOM PROCEDURE TRAY) ×2 IMPLANT
PACK UNIVERSAL I (CUSTOM PROCEDURE TRAY) ×2 IMPLANT
PAD ARMBOARD 7.5X6 YLW CONV (MISCELLANEOUS) ×4 IMPLANT
PATTIES SURGICAL .5 X.5 (GAUZE/BANDAGES/DRESSINGS) ×6 IMPLANT
PATTIES SURGICAL .5 X1 (DISPOSABLE) ×2 IMPLANT
PATTIES SURGICAL .5X1.5 (GAUZE/BANDAGES/DRESSINGS) ×2 IMPLANT
ROD EXPEDIUM PREBENT 5.5X35MM (Rod) ×4 IMPLANT
SCREW POLYAXIAL 7X45MM (Screw) ×8 IMPLANT
SCREW SET SINGLE INNER (Screw) ×8 IMPLANT
SPONGE GAUZE 4X4 12PLY (GAUZE/BANDAGES/DRESSINGS) ×2 IMPLANT
SPONGE INTESTINAL PEANUT (DISPOSABLE) ×2 IMPLANT
SPONGE SURGIFOAM ABS GEL 100 (HEMOSTASIS) ×2 IMPLANT
STRIP CLOSURE SKIN 1/2X4 (GAUZE/BANDAGES/DRESSINGS) IMPLANT
SURGIFLO TRUKIT (HEMOSTASIS) ×4 IMPLANT
SUT BONE WAX W31G (SUTURE) ×2 IMPLANT
SUT MNCRL AB 3-0 PS2 18 (SUTURE) ×2 IMPLANT
SUT MNCRL AB 4-0 PS2 18 (SUTURE) ×2 IMPLANT
SUT VIC AB 0 CT1 18XCR BRD 8 (SUTURE) ×1 IMPLANT
SUT VIC AB 0 CT1 8-18 (SUTURE) ×1
SUT VIC AB 1 CT1 18XCR BRD 8 (SUTURE) ×2 IMPLANT
SUT VIC AB 1 CT1 8-18 (SUTURE) ×4
SUT VIC AB 2-0 CT2 18 VCP726D (SUTURE) ×2 IMPLANT
SYR 20CC LL (SYRINGE) ×2 IMPLANT
SYR BULB IRRIGATION 50ML (SYRINGE) ×2 IMPLANT
SYR CONTROL 10ML LL (SYRINGE) ×2 IMPLANT
TAPE CLOTH SURG 4X10 WHT LF (GAUZE/BANDAGES/DRESSINGS) ×2 IMPLANT
TAPE CLOTH SURG 6X10 WHT LF (GAUZE/BANDAGES/DRESSINGS) ×2 IMPLANT
TOWEL OR 17X24 6PK STRL BLUE (TOWEL DISPOSABLE) ×2 IMPLANT
TOWEL OR 17X26 10 PK STRL BLUE (TOWEL DISPOSABLE) ×2 IMPLANT
TRAY FOLEY CATH 14FR (SET/KITS/TRAYS/PACK) ×2 IMPLANT
WATER STERILE IRR 1000ML POUR (IV SOLUTION) IMPLANT
YANKAUER SUCT BULB TIP NO VENT (SUCTIONS) ×2 IMPLANT

## 2012-02-06 NOTE — Progress Notes (Signed)
PRBC initiated at this time at 75cc/hr, pt educated regarding transfusion, will cont to assess vital signs for any adverse reactions

## 2012-02-06 NOTE — Anesthesia Procedure Notes (Addendum)
Date/Time: 02/06/2012 8:46 AM Performed by: Jeani Hawking    Procedure Name: Intubation Date/Time: 02/06/2012 8:46 AM Performed by: Jeani Hawking Pre-anesthesia Checklist: Patient identified, Emergency Drugs available, Suction available, Patient being monitored and Timeout performed Patient Re-evaluated:Patient Re-evaluated prior to inductionOxygen Delivery Method: Circle system utilized Preoxygenation: Pre-oxygenation with 100% oxygen Intubation Type: IV induction Ventilation: Mask ventilation without difficulty Laryngoscope Size: Mac and 3 Grade View: Grade III Tube type: Oral Tube size: 7.0 mm Number of attempts: 2 (slightly anterior, large caps/crown on front teeth) Airway Equipment and Method: Stylet Placement Confirmation: ETT inserted through vocal cords under direct vision,  positive ETCO2,  CO2 detector and breath sounds checked- equal and bilateral Secured at: 22 cm Tube secured with: Tape Dental Injury: Teeth and Oropharynx as per pre-operative assessment

## 2012-02-06 NOTE — Transfer of Care (Signed)
Immediate Anesthesia Transfer of Care Note  Patient: [name]  Procedure(s) Performed: Procedure(s) (LRB): POSTERIOR LUMBAR FUSION 1 LEVEL (Right)  Patient Location: PACU  Anesthesia Type: General  Level of Consciousness: awake, alert  and oriented  Airway & Oxygen Therapy: Patient Spontanous Breathing and Patient connected to nasal cannula oxygen  Post-op Assessment: Report given to PACU RN and Post -op Vital signs reviewed and stable  Post vital signs: Reviewed and stable  Complications: No apparent anesthesia complications

## 2012-02-06 NOTE — Anesthesia Postprocedure Evaluation (Signed)
Anesthesia Post Note  Patient: [name]  Procedure(s) Performed: Procedure(s) (LRB): POSTERIOR LUMBAR FUSION 1 LEVEL (Right)  Anesthesia type: general  Patient location: PACU  Post pain: Pain level controlled  Post assessment: Patient's Cardiovascular Status Stable  Last Vitals:  Filed Vitals:   02/06/12 1500  BP: 119/56  Pulse: 68  Temp:   Resp: 18    Post vital signs: Reviewed and stable  Level of consciousness: sedated  Complications: No apparent anesthesia complications

## 2012-02-06 NOTE — Progress Notes (Signed)
Blood infusion administered,350 cc infused, pt tol/well, no s/s of adverse reactions, VSS, awaiting CBC as per md orders, will cont to assess.
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
## 2012-02-06 NOTE — H&P (Signed)
PREOPERATIVE H&P  Chief Complaint: right leg pain  HPI: [name] is a 61 y.o. female who presents with right leg pain  Past Medical History  Diagnosis Date  . Hypertension   . Hyperlipidemia   . Anxiety   . Arthritis    Past Surgical History  Procedure Date  . Appendectomy     removed as child  . Partial hysterectomy    History   Social History  . Marital Status: Married    Spouse Name: N/A    Number of Children: N/A  . Years of Education: N/A   Social History Main Topics  . Smoking status: Current Everyday Smoker -- 0.2 packs/day for 20 years  . Smokeless tobacco: Never Used  . Alcohol Use: Yes     either a glass of wine a night or 2 beers  . Drug Use: Yes    Special: Marijuana     uses to help with pain 2-3 times a week  . Sexually Active:    Other Topics Concern  . Not on file   Social History Narrative  . No narrative on file   No family history on file. No Known Allergies Prior to Admission medications   Medication Sig Start Date End Date Taking? Authorizing Provider  Ascorbic Acid (VITAMIN C) 1000 MG tablet Take 1,000 mg by mouth daily.   Yes Historical Provider, MD  aspirin 81 MG tablet Take 81 mg by mouth daily.   Yes Historical Provider, MD  bisoprolol (ZEBETA) 10 MG tablet Take 10 mg by mouth daily.   Yes Historical Provider, MD  buPROPion (WELLBUTRIN XL) 300 MG 24 hr tablet Take 300 mg by mouth daily.   Yes Historical Provider, MD  Cholecalciferol (VITAMIN D) 2000 UNITS tablet Take 2,000 Units by mouth daily.   Yes Historical Provider, MD  Colesevelam HCl [facility]) 3.75 G PACK Take 3.75 g by mouth daily.   Yes Historical Provider, MD  enalapril (VASOTEC) 20 MG tablet Take 20 mg by mouth daily.   Yes Historical Provider, MD  meloxicam (MOBIC) 15 MG tablet Take 15 mg by mouth daily.   Yes Historical Provider, MD  mirabegron ER (MYRBETRIQ) 25 MG TB24 Take 25 mg by mouth daily.   Yes Historical Provider, MD  Multiple Vitamin (MULTIVITAMIN WITH  MINERALS) TABS Take 1 tablet by mouth daily.   Yes Historical Provider, MD     All other systems have been reviewed and were otherwise negative with the exception of those mentioned in the HPI and as above.  Physical Exam: Filed Vitals:   02/06/12 0629  BP: 144/75  Pulse: 64  Temp: 98.1 F (36.7 C)  Resp: 18    General: Alert, no acute distress Cardiovascular: No pedal edema Respiratory: No cyanosis, no use of accessory musculature GI: No organomegaly, abdomen is soft and non-tender Skin: No lesions in the area of chief complaint Neurologic: Sensation intact distally Psychiatric: Patient is competent for consent with normal mood and affect Lymphatic: No axillary or cervical lymphadenopathy  MUSCULOSKELETAL: + TTP low back  Assessment/Plan: Low back pain with right leg pain Plan for Procedure(s): POSTERIOR LUMBAR FUSION 1 LEVEL   Emilee Hero, MD 02/06/2012 7:05 AM

## 2012-02-06 NOTE — Anesthesia Preprocedure Evaluation (Addendum)
Anesthesia Evaluation  Patient identified by MRN, date of birth, ID band Patient awake    Reviewed: Allergy & Precautions, H&P , NPO status , Patient's Chart, lab work & pertinent test results, reviewed documented beta blocker date and time   History of Anesthesia Complications Negative for: history of anesthetic complications  Airway Mallampati: II TM Distance: >3 FB Neck ROM: Full    Dental  (+) Caps, Teeth Intact and Dental Advisory Given,    Pulmonary Current Smoker,          Cardiovascular hypertension, Pt. on medications     Neuro/Psych Anxiety negative neurological ROS     GI/Hepatic negative GI ROS, Neg liver ROS,   Endo/Other  negative endocrine ROS  Renal/GU negative Renal ROS     Musculoskeletal negative musculoskeletal ROS (+)   Abdominal   Peds  Hematology negative hematology ROS (+)   Anesthesia Other Findings   Reproductive/Obstetrics negative OB ROS                          Anesthesia Physical Anesthesia Plan  ASA: II  Anesthesia Plan: General   Post-op Pain Management:    Induction: Intravenous  Airway Management Planned: Oral ETT  Additional Equipment:   Intra-op Plan:   Post-operative Plan: Extubation in OR  Informed Consent: I have reviewed the patients History and Physical, chart, labs and discussed the procedure including the risks, benefits and alternatives for the proposed anesthesia with the patient or authorized representative who has indicated his/her understanding and acceptance.     Plan Discussed with: CRNA and Surgeon  Anesthesia Plan Comments:         Anesthesia Quick Evaluation

## 2012-02-06 NOTE — Preoperative (Signed)
Beta Blockers   Reason not to administer Beta Blockers:Not Applicable 

## 2012-02-07 ENCOUNTER — Encounter (HOSPITAL_COMMUNITY): Payer: Self-pay | Admitting: General Practice

## 2012-02-07 LAB — CBC
Hemoglobin: 10.2 g/dL — ABNORMAL LOW (ref 12.0–15.0)
MCH: 27.3 pg (ref 26.0–34.0)
MCV: 79.1 fL (ref 78.0–100.0)
Platelets: 159 10*3/uL (ref 150–400)
RBC: 3.73 MIL/uL — ABNORMAL LOW (ref 3.87–5.11)
WBC: 8.6 10*3/uL (ref 4.0–10.5)

## 2012-02-07 MED ORDER — OXYCODONE HCL 10 MG PO TB12
10.0000 mg | ORAL_TABLET | Freq: Two times a day (BID) | ORAL | Status: DC
  Administered 2012-02-07 – 2012-02-08 (×3): 10 mg via ORAL
  Filled 2012-02-07 (×3): qty 1

## 2012-02-07 MED ORDER — FERROUS SULFATE 325 (65 FE) MG PO TABS
325.0000 mg | ORAL_TABLET | Freq: Three times a day (TID) | ORAL | Status: DC
  Administered 2012-02-07 – 2012-02-08 (×3): 325 mg via ORAL
  Filled 2012-02-07 (×7): qty 1

## 2012-02-07 MED FILL — Sodium Chloride Irrigation Soln 0.9%: Qty: 3000 | Status: AC

## 2012-02-07 MED FILL — Heparin Sodium (Porcine) Inj 1000 Unit/ML: INTRAMUSCULAR | Qty: 30 | Status: AC

## 2012-02-07 MED FILL — Sodium Chloride IV Soln 0.9%: INTRAVENOUS | Qty: 1000 | Status: AC

## 2012-02-07 NOTE — Progress Notes (Signed)
Patient reports minimal pain.  Leg pain resolved.  BP 96/45  Pulse 69  Temp 97.8 F (36.6 C) (Oral)  Resp 14  SpO2 97%  NVI Dressing CDI Pt looks very comfortable   Hg 10.2 today  POD #1 after L4/5 TLIF  - maintain drain for at least 24 more hours - will monitor BP - d/c PCA and foley - up with PT today - likely d/c home tomorrow vs. Saturday

## 2012-02-07 NOTE — Evaluation (Signed)
Occupational Therapy Evaluation Patient Details Name: [name] MRN: [id number] DOB: [birth year] Today's Date: 02/07/2012 Time: 4098-1191 OT Time Calculation (min): 34 min  OT Assessment / Plan / Recommendation Clinical Impression  Pt s/p L4-5 TLIF and presents with decreased I with ADL due to pain and weakness. Pt will benefit from skilled OT in the acute setting to maximize I with ADL and ADL mobility prior to d/c     OT Assessment  Patient needs continued OT Services    Follow Up Recommendations  No OT follow up;Supervision/Assistance - 24 hour    Barriers to Discharge      Equipment Recommendations  None recommended by OT    Recommendations for Other Services    Frequency  Min 2X/week    Precautions / Restrictions Precautions Precautions: Back;Fall Restrictions Weight Bearing Restrictions: No   Pertinent Vitals/Pain Pt with 0/10 pain supine in bed; 3/10 with activity in low back    ADL  Grooming: Performed;Wash/dry hands;Wash/dry face;Min guard Where Assessed - Grooming: Unsupported standing Upper Body Bathing: Simulated;Supervision/safety;Set up Where Assessed - Upper Body Bathing: Unsupported sitting Lower Body Bathing: Simulated;Minimal assistance Where Assessed - Lower Body Bathing: Unsupported sit to stand Lower Body Dressing: Simulated;Min guard Where Assessed - Lower Body Dressing: Unsupported sit to stand Toilet Transfer: Performed;Supervision/safety Toilet Transfer Method: Sit to Barista: Regular height toilet;Grab bars Toileting - Clothing Manipulation and Hygiene: Performed;Min guard Where Assessed - Engineer, mining and Hygiene: Sit on 3-in-1 or toilet Equipment Used: Gait belt;Rolling walker Transfers/Ambulation Related to ADLs: Pt Min Guard A with ambulation throughout room for safety; VC to maintain back precautions (esp no twisting) ADL Comments: Pt doing well- limited near end of session by nausea. Pt  able to cross bil ankles over knees to perform LB dsg    OT Diagnosis: Generalized weakness;Acute pain  OT Problem List: Decreased activity tolerance;Impaired balance (sitting and/or standing);Decreased knowledge of use of DME or AE;Pain;Decreased knowledge of precautions OT Treatment Interventions: Self-care/ADL training;DME and/or AE instruction;Therapeutic activities;Patient/family education;Balance training   OT Goals Acute Rehab OT Goals OT Goal Formulation: With patient Time For Goal Achievement: 02/14/12 Potential to Achieve Goals: Good ADL Goals Pt Will Perform Grooming: Independently;Standing at sink ADL Goal: Grooming - Progress: Goal set today Pt Will Perform Lower Body Bathing: with modified independence;Independently;Standing at sink ADL Goal: Lower Body Bathing - Progress: Goal set today Pt Will Perform Lower Body Dressing: with modified independence;Sit to stand from bed;Sit to stand from chair ADL Goal: Lower Body Dressing - Progress: Goal set today Pt Will Transfer to Toilet: with modified independence;Ambulation;with DME ADL Goal: Toilet Transfer - Progress: Goal set today Pt Will Perform Toileting - Hygiene: with modified independence;Standing at 3-in-1/toilet;Sitting on 3-in-1 or toilet ADL Goal: Toileting - Hygiene - Progress: Goal set today Pt Will Perform Tub/Shower Transfer: Tub transfer;with supervision;Ambulation ADL Goal: Tub/Shower Transfer - Progress: Goal set today Additional ADL Goal #1: Pt will verbalize/generalize 3/3 back precautions for use with functional activities ADL Goal: Additional Goal #1 - Progress: Goal set today  Visit Information  Last OT Received On: 02/07/12 Assistance Needed: +1    Subjective Data  Subjective: I'm starting to feel a little nauseous Patient Stated Goal: Return home   Prior Functioning  Vision/Perception  Home Living Lives With: Spouse Available Help at Discharge: Family;Available 24 hours/day Type of Home:  House Home Access: Stairs to enter [employer] of Steps: 2 Entrance Stairs-Rails: None Home Layout: Two level;Able to live on main level with bedroom/bathroom  Alternate Level Stairs-Number of Steps: 14 Alternate Level Stairs-Rails: Right Bathroom Shower/Tub: Tub/shower unit;Door [employer] Toilet: Handicapped height Bathroom Accessibility: Yes How Accessible: Accessible via walker Home Adaptive Equipment: Straight cane Prior Function Level of Independence: Independent Able to Take Stairs?: Reciprically Driving: Yes Vocation: Full time employment Comments: caregiver/companion Communication Communication: No difficulties Dominant Hand: Right      Cognition  Overall Cognitive Status: Appears within functional limits for tasks assessed/performed Arousal/Alertness: Awake/alert Orientation Level: Appears intact for tasks assessed Behavior During Session: [facility] for tasks performed    Extremity/Trunk Assessment Right Upper Extremity Assessment RUE ROM/Strength/Tone: WFL for tasks assessed RUE Sensation: WFL - Light Touch RUE Coordination: WFL - gross/fine motor Left Upper Extremity Assessment LUE ROM/Strength/Tone: WFL for tasks assessed LUE Sensation: WFL - Light Touch LUE Coordination: WFL - gross/fine motor   Mobility Bed Mobility Bed Mobility: Left Sidelying to Sit;Rolling Left;Sitting - Scoot to Edge of Bed Rolling Left: 5: Supervision;With rail Left Sidelying to Sit: 5: Supervision;With rails Sitting - Scoot to Edge of Bed: 5: Supervision Details for Bed Mobility Assistance: VC for sequencing to maintain back precautions Transfers Transfers: Sit to Stand;Stand to Sit Sit to Stand: 5: Supervision;From bed;From toilet;With armrests Stand to Sit: 5: Supervision;With armrests;To chair/3-in-1;To toilet Details for Transfer Assistance: VC for hand placement and supervision for safety   Exercise    Balance    End of Session OT - End of Session Equipment Utilized  During Treatment: Gait belt Activity Tolerance:  (limited by nausea) Patient left: in chair;with call bell/phone within reach Nurse Communication: Mobility status  GO     [name] 02/07/2012, 8:54 AM

## 2012-02-07 NOTE — Evaluation (Signed)
Physical Therapy Evaluation Patient Details Name: [name] MRN: [id number] DOB: April 30, 1951 Today's Date: 02/07/2012 Time: 4098-1191 PT Time Calculation (min): 29 min  PT Assessment / Plan / Recommendation Clinical Impression  Pt is 61 y/o female admitted for s/p lumbar fusion.  Pt moving well and will benefit from acute PT services to improve overall mobility.      PT Assessment  Patient needs continued PT services    Follow Up Recommendations  Home health PT    Barriers to Discharge        Equipment Recommendations  Rolling walker with 5" wheels    Recommendations for Other Services     Frequency Min 5X/week    Precautions / Restrictions Precautions Precautions: Back;Fall Restrictions Weight Bearing Restrictions: No   Pertinent Vitals/Pain 4/10 back pain      Mobility  Bed Mobility Bed Mobility: Left Sidelying to Sit;Rolling Left;Sitting - Scoot to Edge of Bed Rolling Left: 5: Supervision;With rail Left Sidelying to Sit: 5: Supervision;With rails Sitting - Scoot to Edge of Bed: 5: Supervision Details for Bed Mobility Assistance: VC for sequencing to maintain back precautions Transfers Transfers: Sit to Stand;Stand to Sit Sit to Stand: From bed;From toilet;With armrests;4: Min guard Stand to Sit: With armrests;To chair/3-in-1;To toilet;4: Min guard Details for Transfer Assistance: VC for hand placement and supervision for safety Ambulation/Gait Ambulation/Gait Assistance: 4: Min guard Ambulation Distance (Feet): 125 Feet Assistive device: Rolling walker Ambulation/Gait Assistance Details: Minguard for safety with cues for proper RW placement.  Max cues to prevent twisting during ambulation.  Noticeable right foot drop during ambulation. Gait Pattern: Step-through pattern;Decreased stride length;Right foot flat;Shuffle;Antalgic    Exercises     PT Diagnosis: Difficulty walking;Generalized weakness;Acute pain;Abnormality of gait  PT Problem List: Decreased  activity tolerance;Decreased mobility;Decreased knowledge of use of DME;Pain PT Treatment Interventions: DME instruction;Gait training;Stair training;Functional mobility training;Therapeutic activities;Therapeutic exercise;Patient/family education   PT Goals Acute Rehab PT Goals PT Goal Formulation: With patient Time For Goal Achievement: 02/14/12 Potential to Achieve Goals: Good Pt will Roll Supine to Left Side: with modified independence PT Goal: Rolling Supine to Left Side - Progress: Goal set today Pt will go Sit to Supine/Side: with modified independence PT Goal: Sit to Supine/Side - Progress: Goal set today Pt will go Sit to Stand: with modified independence PT Goal: Sit to Stand - Progress: Goal set today Pt will go Stand to Sit: with modified independence PT Goal: Stand to Sit - Progress: Goal set today Pt will Ambulate: >150 feet;with modified independence;with rolling walker PT Goal: Ambulate - Progress: Goal set today Pt will Go Up / Down Stairs: 1-2 stairs;with min assist;with least restrictive assistive device PT Goal: Up/Down Stairs - Progress: Goal set today Additional Goals Additional Goal #1: Pt able to recall and adhere to 3/3 back precautions. PT Goal: Additional Goal #1 - Progress: Goal set today  Visit Information  Last PT Received On: 02/07/12 Assistance Needed: +1    Subjective Data  Subjective: "I'm feeling much better than this morning." Patient Stated Goal: To go home.   Prior Functioning  Home Living Lives With: Spouse Available Help at Discharge: Family;Available 24 hours/day Type of Home: House Home Access: Stairs to enter [employer] of Steps: 2 Entrance Stairs-Rails: None Home Layout: Two level;Able to live on main level with bedroom/bathroom Alternate Level Stairs-Number of Steps: 14 Alternate Level Stairs-Rails: Right Bathroom Shower/Tub: Tub/shower unit;Door [employer] Toilet: Handicapped height Bathroom Accessibility: Yes How  Accessible: Accessible via walker Home Adaptive Equipment: Straight cane Prior  Function Level of Independence: Independent Able to Take Stairs?: Reciprically Driving: Yes Vocation: Full time employment Communication Communication: No difficulties Dominant Hand: Right    Cognition  Overall Cognitive Status: Appears within functional limits for tasks assessed/performed Arousal/Alertness: Awake/alert Orientation Level: Appears intact for tasks assessed Behavior During Session: [facility] for tasks performed    Extremity/Trunk Assessment Right Upper Extremity Assessment RUE ROM/Strength/Tone: WFL for tasks assessed RUE Sensation: WFL - Light Touch RUE Coordination: WFL - gross/fine motor Left Upper Extremity Assessment LUE ROM/Strength/Tone: WFL for tasks assessed LUE Sensation: WFL - Light Touch LUE Coordination: WFL - gross/fine motor Right Lower Extremity Assessment RLE ROM/Strength/Tone: Deficits RLE ROM/Strength/Tone Deficits: Overall Strength WFL however noticeable foot drop with ambulation. RLE Sensation: WFL - Light Touch Left Lower Extremity Assessment LLE ROM/Strength/Tone: Within functional levels LLE Sensation: WFL - Light Touch   Balance    End of Session PT - End of Session Equipment Utilized During Treatment: Gait belt Activity Tolerance: Patient tolerated treatment well Patient left: in chair;with call bell/phone within reach Nurse Communication: Mobility status  GP     Aleem Elza 02/07/2012, 12:01 PM Jake Shark, PT DPT [phone]

## 2012-02-08 MED ORDER — OXYCODONE HCL 10 MG PO TB12: 10.0000 mg | ORAL_TABLET | Freq: Two times a day (BID) | ORAL | Status: DC

## 2012-02-08 NOTE — Progress Notes (Addendum)
Pt repts that preop she was having low back pain with R leg pain, tingling and numbness. Pt repts that preop s/sx are "gone" postop. Neuro check is negative. Plan is to d/c to home today per MD order.

## 2012-02-08 NOTE — Progress Notes (Signed)
Physical Therapy Treatment Patient Details Name: [name] MRN: [id number] DOB: 1950/08/26 Today's Date: 02/08/2012 Time: 0803-0820 PT Time Calculation (min): 17 min  PT Assessment / Plan / Recommendation Comments on Treatment Session  Pt continues to show improvement and able to improve overall mobility.  Pt able to perform stair negotiation.    Follow Up Recommendations  No PT follow up    Barriers to Discharge        Equipment Recommendations  Rolling walker with 5" wheels    Recommendations for Other Services    Frequency Min 5X/week   Plan Discharge plan remains appropriate;Frequency remains appropriate    Precautions / Restrictions Precautions Precautions: Back;Fall Precaution Comments: Reviewed back precautions and handout Restrictions Weight Bearing Restrictions: No   Pertinent Vitals/Pain 3/10 back pain    Mobility  Transfers Transfers: Sit to Stand;Stand to Sit Sit to Stand: 6: Modified independent (Device/Increase time) Stand to Sit: 6: Modified independent (Device/Increase time) Ambulation/Gait Ambulation/Gait Assistance: 5: Supervision Ambulation Distance (Feet): 125 Feet Assistive device: Rolling walker;None (Ambulated 50' without RW) Ambulation/Gait Assistance Details: Pt moving well but needs cues to prevent twisting at times Stairs: Yes Stairs Assistance: 4: Min assist Stairs Assistance Details (indicate cue type and reason): HHA to ascend/descend to two stairs with no rails.  Max cues for step sequence. Stair Management Technique: No rails;Backwards Number of Stairs: 2     Exercises     PT Diagnosis:    PT Problem List:   PT Treatment Interventions:     PT Goals Acute Rehab PT Goals PT Goal Formulation: With patient Time For Goal Achievement: 02/14/12 Potential to Achieve Goals: Good Pt will go Sit to Stand: with modified independence PT Goal: Sit to Stand - Progress: Met Pt will go Stand to Sit: with modified independence PT Goal:  Stand to Sit - Progress: Met Pt will Ambulate: >150 feet;with modified independence;with rolling walker PT Goal: Ambulate - Progress: Progressing toward goal Pt will Go Up / Down Stairs: 1-2 stairs;with min assist;with least restrictive assistive device PT Goal: Up/Down Stairs - Progress: Met Additional Goals Additional Goal #1: Pt able to recall and adhere to 3/3 back precautions. PT Goal: Additional Goal #1 - Progress: Progressing toward goal  Visit Information  Last PT Received On: 02/08/12 Assistance Needed: +1    Subjective Data  Subjective: "I'm feeling good and ready to go home." Patient Stated Goal: To go home.   Cognition  Overall Cognitive Status: Appears within functional limits for tasks assessed/performed Arousal/Alertness: Awake/alert Orientation Level: Appears intact for tasks assessed Behavior During Session: [facility] for tasks performed    Balance     End of Session PT - End of Session Equipment Utilized During Treatment: Gait belt Activity Tolerance: Patient tolerated treatment well Patient left: in chair;with call bell/phone within reach (Pt sitting EOB waiting for RN for dressing change) Nurse Communication: Mobility status   GP     Renee Erb 02/08/2012, 8:47 AM  Jake Shark, PT DPT [phone]

## 2012-02-08 NOTE — Progress Notes (Signed)
Occupational Therapy Treatment Patient Details Name: APPHIA CROPLEY MRN: [id number] DOB: July 23, 1950 Today's Date: 02/08/2012 Time: 1191-4782 OT Time Calculation (min): 19 min  OT Assessment / Plan / Recommendation Comments on Treatment Session Pt progressing well. Educated on AE.      Follow Up Recommendations  No OT follow up;Supervision/Assistance - 24 hour    Barriers to Discharge       Equipment Recommendations  Rolling walker with 5" wheels    Recommendations for Other Services    Frequency     Plan Discharge plan remains appropriate    Precautions / Restrictions Precautions Precautions: Back;Fall Precaution Comments: Pt able to verbalize 1/3 back precautions, but able to generalize other 2 in session. Re-educated on all back precautions. No order for back brace, but pt states she was given one in office and has it at home Restrictions Weight Bearing Restrictions: No   Pertinent Vitals/Pain Pt reports 3/10 back pain; pre-medicated    ADL  Grooming: Performed;Modified independent Where Assessed - Grooming: Unsupported standing Upper Body Bathing: Simulated;Supervision/safety Where Assessed - Upper Body Bathing: Unsupported standing Lower Body Bathing: Simulated;Minimal assistance Where Assessed - Lower Body Bathing: Unsupported standing Lower Body Dressing: Simulated;Supervision/safety Where Assessed - Lower Body Dressing: Unsupported sit to stand Toilet Transfer: Performed;Modified independent Toilet Transfer Method: Sit to Barista: Regular height toilet;Grab bars Toileting - Clothing Manipulation and Hygiene: Simulated;Minimal assistance Where Assessed - Engineer, mining and Hygiene: Sit to stand from 3-in-1 or toilet Tub/Shower Transfer: Engineer, manufacturing Method: Science writer: Walk in shower (and tub/shower) Equipment Used: Gait belt;Rolling  walker Transfers/Ambulation Related to ADLs: Pt Mod I with RW ambulation  ADL Comments: Pt progressing well    OT Diagnosis:    OT Problem List:   OT Treatment Interventions:     OT Goals ADL Goals ADL Goal: Grooming - Progress: Progressing toward goals ADL Goal: Lower Body Dressing - Progress: Met ADL Goal: Statistician - Progress: Met ADL Goal: Toileting - Hygiene - Progress: Progressing toward goals ADL Goal: Tub/Shower Transfer - Progress: Met ADL Goal: Additional Goal #1 - Progress: Progressing toward goals  Visit Information  Last OT Received On: 02/08/12 Assistance Needed: +1    Subjective Data      Prior Functioning       Cognition  Overall Cognitive Status: Appears within functional limits for tasks assessed/performed Arousal/Alertness: Awake/alert Orientation Level: Appears intact for tasks assessed Behavior During Session: [facility] for tasks performed    Mobility Transfers Sit to Stand: 6: Modified independent (Device/Increase time);With armrests;From chair/3-in-1 Stand to Sit: 6: Modified independent (Device/Increase time);With armrests;To chair/3-in-1   Exercises    Balance    End of Session OT - End of Session Equipment Utilized During Treatment: Gait belt Activity Tolerance: Patient tolerated treatment well Patient left:  (with PT in gym)  GO     Danielle Mink 02/08/2012, 10:11 AM

## 2012-02-08 NOTE — Progress Notes (Signed)
Patient reports minimal pain. Leg pain resolved.   BP 148/64  Pulse 71  Temp 97.9 F (36.6 C) (Oral)  Resp 18  SpO2 98%  NVI  Dressing CDI  Pt looks very comfortable  Hg 10.2 yesterday  POD #2 after L4/5 TLIF  - d/c drain   - up with PT/OT  - likely d/c home today

## 2012-02-10 LAB — TYPE AND SCREEN

## 2012-02-14 NOTE — Discharge Summary (Signed)
NAMESHAMYAH, [name]:  [id number]  MEDICAL RECORD NO.:  [id number]  LOCATION:  [id number]                        FACILITY:  [facility]  PHYSICIAN:  Estill Bamberg, MD      DATE OF BIRTH:  [birth year]  DATE OF ADMISSION:  02/06/2012 DATE OF DISCHARGE:  02/08/2012                              DISCHARGE SUMMARY   ADMISSION DIAGNOSIS:  Large extruded L4-5 disk fragment causing severe lateral recess compression on the right side and severe right-sided L4- L5 radiculopathy.  DISCHARGE DIAGNOSIS:  Large extruded L4-5 disk fragment causing severe lateral recess compression on the right side and severe right-sided L4- L5 radiculopathy.  In addition a complex right-sided epidural venous plexus, status post decompression and fusion.  ADMISSION HISTORY:  Briefly, Ms. Holster is an extremely pleasant 61- year-old female who I have been following for severe pain in the right leg.  An MRI was notable for a complex mass involving the lateral recess on the right side.  The patient fulfilled multiple forms of conservative care and a decision was ultimately made to bring the patient to surgery to go forward with a right-sided L4-5 decompression and transforaminal lumbar interbody fusion.  HOSPITAL COURSE:  On February 06, 2012, the patient was brought to surgery and general endotracheal anesthesia was administered.  The patient tolerated the procedure well and was transferred to recovery in stable condition.  Of note, there was a complex significant venous plexus identified on the lateral recess.  This was very difficult to control. The patient did ultimately lose 1200 mL of blood throughout the procedure.  However, control was ultimately obtained and the procedure did proceed.  Deep drain was placed.  The patient was evaluated by me in the morning of postoperative day #1 and in the morning of postoperative day #2.  The drainage to this did decrease over time.  On the morning  of postoperative day #2, the drain was discontinued.  The patient's pain was well controlled.  On the morning of postoperative day #2, the patient was uneventfully discharged home.  Of note, the patient was progressively mobilized throughout her hospital stay.  It was on the morning of postoperative day #2, it was felt that her pain was well controlled, and the patient was amenable for discharge.  DISCHARGE INSTRUCTIONS:  The patient will take Percocet for pain and Valium for spasms.  She will adhere to back precautions at all times. She will follow up with me in approximately 2 weeks after procedure.     Estill Bamberg, MD    MD/MEDQ  D:  02/13/2012  T:  02/13/2012  Job:  [id number]

## 2012-05-26 ENCOUNTER — Other Ambulatory Visit: Payer: Self-pay | Admitting: Orthopedic Surgery

## 2012-05-28 ENCOUNTER — Encounter (HOSPITAL_COMMUNITY)
Admission: RE | Admit: 2012-05-28 | Discharge: 2012-05-28 | Disposition: A | Discharge status: Home / Self Care | Payer: 59 | Source: Ambulatory Visit | Attending: Orthopedic Surgery | Admitting: Orthopedic Surgery

## 2012-05-28 ENCOUNTER — Encounter (HOSPITAL_COMMUNITY): Payer: Self-pay

## 2012-05-28 ENCOUNTER — Ambulatory Visit (HOSPITAL_COMMUNITY)
Admission: RE | Admit: 2012-05-28 | Discharge: 2012-05-28 | Disposition: A | Discharge status: Home / Self Care | Payer: 59 | Source: Ambulatory Visit | Attending: Orthopedic Surgery | Admitting: Orthopedic Surgery

## 2012-05-28 DIAGNOSIS — I10 Essential (primary) hypertension: Secondary | ICD-10-CM | POA: Insufficient documentation

## 2012-05-28 DIAGNOSIS — Z87891 Personal history of nicotine dependence: Secondary | ICD-10-CM | POA: Insufficient documentation

## 2012-05-28 DIAGNOSIS — Z01811 Encounter for preprocedural respiratory examination: Secondary | ICD-10-CM | POA: Insufficient documentation

## 2012-05-28 DIAGNOSIS — E119 Type 2 diabetes mellitus without complications: Secondary | ICD-10-CM | POA: Insufficient documentation

## 2012-05-28 HISTORY — DX: Type 2 diabetes mellitus without complications: E11.9

## 2012-05-28 LAB — URINALYSIS, ROUTINE W REFLEX MICROSCOPIC
Glucose, UA: NEGATIVE mg/dL
Hgb urine dipstick: NEGATIVE
Leukocytes, UA: NEGATIVE
Protein, ur: NEGATIVE mg/dL
Specific Gravity, Urine: 1.005 (ref 1.005–1.030)
pH: 6 (ref 5.0–8.0)

## 2012-05-28 LAB — CBC WITH DIFFERENTIAL/PLATELET
Basophils Absolute: 0 10*3/uL (ref 0.0–0.1)
Basophils Relative: 0 % (ref 0–1)
Eosinophils Absolute: 0.2 10*3/uL (ref 0.0–0.7)
HCT: 36.8 % (ref 36.0–46.0)
MCH: 26.2 pg (ref 26.0–34.0)
MCHC: 34.2 g/dL (ref 30.0–36.0)
Monocytes Absolute: 0.6 10*3/uL (ref 0.1–1.0)
Neutro Abs: 3.2 10*3/uL (ref 1.7–7.7)
RDW: 15.8 % — ABNORMAL HIGH (ref 11.5–15.5)

## 2012-05-28 LAB — SURGICAL PCR SCREEN
MRSA, PCR: NEGATIVE
Staphylococcus aureus: NEGATIVE

## 2012-05-28 LAB — BASIC METABOLIC PANEL
BUN: 9 mg/dL (ref 6–23)
Chloride: 99 mEq/L (ref 96–112)
Creatinine, Ser: 0.61 mg/dL (ref 0.50–1.10)
GFR calc Af Amer: >90 mL/min (ref >90)
GFR calc non Af Amer: >90 mL/min (ref >90)
Glucose, Bld: 82 mg/dL (ref 70–99)

## 2012-05-28 LAB — TYPE AND SCREEN: ABO/RH(D): O POS

## 2012-05-28 MED ORDER — CHLORHEXIDINE GLUCONATE 4 % EX LIQD: 60.0000 mL | Freq: Once | CUTANEOUS | Status: DC

## 2012-05-28 NOTE — Pre-Procedure Instructions (Signed)
20 [name]  05/28/2012   Your procedure is scheduled on:  Monday June 02, 2012  Report to [facility] at 10:30 AM.  Call this number if you have problems the morning of surgery: [phone]   Remember:   Do not eat food or drink After Midnight.      Take these medicines the morning of surgery with A SIP OF WATER: albuterol (bring day of surgery), bisoprolol (Ziac), wellbutrin, hydrocodone   Do not wear jewelry, make-up or nail polish.  Do not wear lotions, powders, or perfumes.  Do not shave 48 hours prior to surgery. Men may shave face and neck.  Do not bring valuables to the hospital.  Contacts, dentures or bridgework may not be worn into surgery.  Leave suitcase in the car. After surgery it may be brought to your room.  For patients admitted to the hospital, checkout time is 11:00 AM the day of discharge.   Patients discharged the day of surgery will not be allowed to drive home.  Name and phone number of your driver:family / friend  Special Instructions: Incentive Spirometry - Practice and bring it with you on the day of surgery. Shower using CHG 2 nights before surgery and the night before surgery.  If you shower the day of surgery use CHG.  Use special wash - you have one bottle of CHG for all showers.  You should use approximately 1/3 of the bottle for each shower.   Please read over the following fact sheets that you were given: Pain Booklet, Coughing and Deep Breathing, Blood Transfusion Information, Total Joint Packet, MRSA Information and Surgical Site Infection Prevention

## 2012-05-29 NOTE — H&P (Signed)
TOTAL HIP ADMISSION H&P  Patient is admitted for right total hip arthroplasty.  Subjective:  Chief Complaint: right hip pain  HPI: [name], 61 y.o. female, has a history of pain and functional disability in the right hip(s) due to arthritis and patient has failed non-surgical conservative treatments for greater than 12 weeks to include NSAID's and/or analgesics, use of assistive devices and activity modification.  Onset of symptoms was gradual starting 5 years ago with gradually worsening course since that time.The patient noted no past surgery on the right hip(s).  Patient currently rates pain in the right hip at 7 out of 10 with activity. Patient has night pain, worsening of pain with activity and weight bearing and pain that interfers with activities of daily living. Patient has evidence of subchondral cysts, periarticular osteophytes and joint space narrowing by imaging studies. This condition presents safety issues increasing the risk of falls.  There is no current active infection.  There are no active problems to display for this patient.  Past Medical History  Diagnosis Date  . Hypertension   . Hyperlipidemia   . Anxiety   . Diabetes mellitus without complication     controlled by diet  . Arthritis     osteoarthritis    Past Surgical History  Procedure Date  . Appendectomy     removed as child  . Partial hysterectomy   . Lumbar fusion 02/06/2012    L 4  L5  . Back surgery     lumbar fusion  . Dilation and curettage of uterus     No prescriptions prior to admission   No Known Allergies  History  Substance Use Topics  . Smoking status: Current Every Day Smoker -- 0.2 packs/day for 20 years  . Smokeless tobacco: Never Used  . Alcohol Use: Yes     Comment: either a glass of wine a night or 2 beers    No family history on file.   Review of Systems  Constitutional: Negative.   HENT: Negative.   Eyes: Negative.   Respiratory: Negative.   Cardiovascular:  Negative.   Gastrointestinal: Negative.   Musculoskeletal: Negative.   Skin: Negative.   Neurological: Negative.   Psychiatric/Behavioral: Negative.     Objective:  Physical Exam  Constitutional: She is oriented to person, place, and time. She appears well-developed and well-nourished.  HENT:  Head: Normocephalic.  Eyes: Pupils are equal, round, and reactive to light.  Cardiovascular: Normal heart sounds.   Respiratory: Breath sounds normal.  GI: Bowel sounds are normal.  Musculoskeletal:       Right hip: She exhibits decreased range of motion and tenderness.  Neurological: She is alert and oriented to person, place, and time.  Psychiatric: She has a normal mood and affect.    Vital signs in last 24 hours:    Labs:   Estimated Body mass index is 28.48 kg/(m^2) as calculated from the following:   Height as of 02/01/12: 5\' 5" (1.651 m).   Weight as of 02/01/12: 171 lb 2 oz(77.622 kg).   Imaging Review Plain radiographs demonstrate severe degenerative joint disease of the bilateral hip(s). The bone quality appears to be adequate for age and reported activity level.  Assessment/Plan:  End stage arthritis, right hip(s)  The patient history, physical examination, clinical judgement of the provider and imaging studies are consistent with end stage degenerative joint disease of the right hip(s) and total hip arthroplasty is deemed medically necessary. The treatment options including medical management, injection therapy, arthroscopy  and arthroplasty were discussed at length. The risks and benefits of total hip arthroplasty were presented and reviewed. The risks due to aseptic loosening, infection, stiffness, dislocation/subluxation,  thromboembolic complications and other imponderables were discussed.  The patient acknowledged the explanation, agreed to proceed with the plan and consent was signed. Patient is being admitted for inpatient treatment for surgery, pain control, PT, OT,  prophylactic antibiotics, VTE prophylaxis, progressive ambulation and ADL's and discharge planning.The patient is planning to be discharged home with home health services

## 2012-05-29 NOTE — Consult Note (Signed)
Patient is a 61 year old female scheduled for right hip arthroplasty by Dr. Turner Daniels on 06/02/2012. She is status post right L4-5 fusion on 02/06/2012. Other history includes smoking, hypertension, hyperlipidemia, diet controlled diabetes mellitus type 2, anxiety, arthritis.  PCP is Dr. Lucky Cowboy.  EKG on 02/01/2012 showed normal sinus rhythm with first-degree A-V block.  Chest x-ray on 05/28/2012 showed stable hyperaeration with borderline cardiomegaly, no active lung disease.  Labs noted.  If no significant change in patient's status then anticipate she can proceed as planned.  Shonna Chock, PA-C

## 2012-05-30 NOTE — Progress Notes (Signed)
Notified of time change to be here at 0945 Monday for surgery.

## 2012-06-01 MED ORDER — CEFAZOLIN SODIUM-DEXTROSE 2-3 GM-% IV SOLR
2.0000 g | INTRAVENOUS | Status: AC
  Administered 2012-06-02: 2 g via INTRAVENOUS
  Filled 2012-06-01: qty 50

## 2012-06-01 MED ORDER — DEXTROSE-NACL 5-0.45 % IV SOLN: INTRAVENOUS | Status: DC

## 2012-06-02 ENCOUNTER — Encounter (HOSPITAL_COMMUNITY): Payer: Self-pay | Admitting: Vascular Surgery

## 2012-06-02 ENCOUNTER — Encounter (HOSPITAL_COMMUNITY): Payer: Self-pay | Admitting: *Deleted

## 2012-06-02 ENCOUNTER — Encounter (HOSPITAL_COMMUNITY)
Admission: RE | Disposition: A | Discharge status: Home Health | Payer: Self-pay | Source: Ambulatory Visit | Attending: Orthopedic Surgery

## 2012-06-02 ENCOUNTER — Inpatient Hospital Stay (HOSPITAL_COMMUNITY): Payer: 59

## 2012-06-02 ENCOUNTER — Inpatient Hospital Stay (HOSPITAL_COMMUNITY)
Admission: RE | Admit: 2012-06-02 | Discharge: 2012-06-04 | DRG: 470 | Disposition: A | Discharge status: Home Health | Payer: 59 | Source: Ambulatory Visit | Attending: Orthopedic Surgery | Admitting: Orthopedic Surgery

## 2012-06-02 ENCOUNTER — Ambulatory Visit (HOSPITAL_COMMUNITY): Payer: 59 | Admitting: Vascular Surgery

## 2012-06-02 DIAGNOSIS — Z23 Encounter for immunization: Secondary | ICD-10-CM

## 2012-06-02 DIAGNOSIS — M1611 Unilateral primary osteoarthritis, right hip: Secondary | ICD-10-CM

## 2012-06-02 DIAGNOSIS — E119 Type 2 diabetes mellitus without complications: Secondary | ICD-10-CM | POA: Diagnosis present

## 2012-06-02 DIAGNOSIS — I10 Essential (primary) hypertension: Secondary | ICD-10-CM | POA: Diagnosis present

## 2012-06-02 DIAGNOSIS — M169 Osteoarthritis of hip, unspecified: Principal | ICD-10-CM | POA: Diagnosis present

## 2012-06-02 DIAGNOSIS — F17200 Nicotine dependence, unspecified, uncomplicated: Secondary | ICD-10-CM | POA: Diagnosis present

## 2012-06-02 DIAGNOSIS — F411 Generalized anxiety disorder: Secondary | ICD-10-CM | POA: Diagnosis present

## 2012-06-02 DIAGNOSIS — M1610 Unilateral primary osteoarthritis, unspecified hip: Principal | ICD-10-CM | POA: Diagnosis present

## 2012-06-02 DIAGNOSIS — E785 Hyperlipidemia, unspecified: Secondary | ICD-10-CM | POA: Diagnosis present

## 2012-06-02 HISTORY — PX: TOTAL HIP ARTHROPLASTY: SHX124

## 2012-06-02 SURGERY — ARTHROPLASTY, HIP, TOTAL,POSTERIOR APPROACH
Anesthesia: General | Site: Hip | Laterality: Right | Wound class: Clean

## 2012-06-02 MED ORDER — PROPOFOL 10 MG/ML IV BOLUS
INTRAVENOUS | Status: DC | PRN
  Administered 2012-06-02: 200 mg via INTRAVENOUS

## 2012-06-02 MED ORDER — OXYCODONE HCL 5 MG PO TABS
5.0000 mg | ORAL_TABLET | ORAL | Status: DC | PRN
  Administered 2012-06-02 – 2012-06-04 (×10): 10 mg via ORAL
  Filled 2012-06-02 (×9): qty 2

## 2012-06-02 MED ORDER — SODIUM CHLORIDE 0.9 % IR SOLN
Status: DC | PRN
  Administered 2012-06-02: 1000 mL

## 2012-06-02 MED ORDER — MENTHOL 3 MG MT LOZG: 1.0000 | LOZENGE | OROMUCOSAL | Status: DC | PRN

## 2012-06-02 MED ORDER — BISOPROLOL-HYDROCHLOROTHIAZIDE 10-6.25 MG PO TABS: 1.0000 | ORAL_TABLET | Freq: Every day | ORAL | Status: DC

## 2012-06-02 MED ORDER — DARIFENACIN HYDROBROMIDE ER 15 MG PO TB24
15.0000 mg | ORAL_TABLET | Freq: Every day | ORAL | Status: DC
  Administered 2012-06-02 – 2012-06-04 (×3): 15 mg via ORAL
  Filled 2012-06-02 (×3): qty 1

## 2012-06-02 MED ORDER — ONDANSETRON HCL 4 MG/2ML IJ SOLN
4.0000 mg | Freq: Four times a day (QID) | INTRAMUSCULAR | Status: DC | PRN
  Administered 2012-06-03: 4 mg via INTRAVENOUS
  Filled 2012-06-02: qty 2

## 2012-06-02 MED ORDER — MIDAZOLAM HCL 2 MG/2ML IJ SOLN: 0.5000 mg | Freq: Once | INTRAMUSCULAR | Status: DC | PRN

## 2012-06-02 MED ORDER — KCL IN DEXTROSE-NACL 20-5-0.45 MEQ/L-%-% IV SOLN
INTRAVENOUS | Status: DC
  Administered 2012-06-02 (×2): via INTRAVENOUS
  Filled 2012-06-02 (×7): qty 1000

## 2012-06-02 MED ORDER — MIDAZOLAM HCL 5 MG/5ML IJ SOLN
INTRAMUSCULAR | Status: DC | PRN
  Administered 2012-06-02: 2 mg via INTRAVENOUS

## 2012-06-02 MED ORDER — ACETAMINOPHEN 650 MG RE SUPP: 650.0000 mg | Freq: Four times a day (QID) | RECTAL | Status: DC | PRN

## 2012-06-02 MED ORDER — ACETAMINOPHEN 10 MG/ML IV SOLN
INTRAVENOUS | Status: DC | PRN
  Administered 2012-06-02: 1000 mg via INTRAVENOUS

## 2012-06-02 MED ORDER — CYCLOBENZAPRINE HCL 10 MG PO TABS
10.0000 mg | ORAL_TABLET | Freq: Two times a day (BID) | ORAL | Status: DC | PRN
  Administered 2012-06-03: 10 mg via ORAL
  Filled 2012-06-02: qty 1

## 2012-06-02 MED ORDER — METOCLOPRAMIDE HCL 10 MG PO TABS: 5.0000 mg | ORAL_TABLET | Freq: Three times a day (TID) | ORAL | Status: DC | PRN

## 2012-06-02 MED ORDER — HYDROMORPHONE HCL PF 1 MG/ML IJ SOLN
INTRAMUSCULAR | Status: AC
  Filled 2012-06-02: qty 1

## 2012-06-02 MED ORDER — PROMETHAZINE HCL 25 MG/ML IJ SOLN: 6.2500 mg | INTRAMUSCULAR | Status: DC | PRN

## 2012-06-02 MED ORDER — HYDROMORPHONE HCL PF 1 MG/ML IJ SOLN
0.2500 mg | INTRAMUSCULAR | Status: DC | PRN
  Administered 2012-06-02 (×2): 0.5 mg via INTRAVENOUS

## 2012-06-02 MED ORDER — FLEET ENEMA 7-19 GM/118ML RE ENEM: 1.0000 | ENEMA | Freq: Once | RECTAL | Status: AC | PRN

## 2012-06-02 MED ORDER — ROCURONIUM BROMIDE 100 MG/10ML IV SOLN
INTRAVENOUS | Status: DC | PRN
  Administered 2012-06-02: 50 mg via INTRAVENOUS

## 2012-06-02 MED ORDER — ACETAMINOPHEN 10 MG/ML IV SOLN
INTRAVENOUS | Status: AC
  Filled 2012-06-02: qty 100

## 2012-06-02 MED ORDER — KCL IN DEXTROSE-NACL 20-5-0.45 MEQ/L-%-% IV SOLN
INTRAVENOUS | Status: AC
  Filled 2012-06-02: qty 1000

## 2012-06-02 MED ORDER — ASPIRIN EC 325 MG PO TBEC
325.0000 mg | DELAYED_RELEASE_TABLET | Freq: Two times a day (BID) | ORAL | Status: DC
  Administered 2012-06-02 – 2012-06-04 (×4): 325 mg via ORAL
  Filled 2012-06-02 (×5): qty 1

## 2012-06-02 MED ORDER — ENALAPRIL MALEATE 20 MG PO TABS
20.0000 mg | ORAL_TABLET | Freq: Every day | ORAL | Status: DC
  Administered 2012-06-03 – 2012-06-04 (×2): 20 mg via ORAL
  Filled 2012-06-02 (×2): qty 1

## 2012-06-02 MED ORDER — METOCLOPRAMIDE HCL 5 MG/ML IJ SOLN: 5.0000 mg | Freq: Three times a day (TID) | INTRAMUSCULAR | Status: DC | PRN

## 2012-06-02 MED ORDER — COLESEVELAM HCL 3.75 G PO PACK
3.7500 g | PACK | Freq: Every day | ORAL | Status: DC
  Filled 2012-06-02 (×2): qty 1

## 2012-06-02 MED ORDER — ONDANSETRON HCL 4 MG PO TABS: 4.0000 mg | ORAL_TABLET | Freq: Four times a day (QID) | ORAL | Status: DC | PRN

## 2012-06-02 MED ORDER — HYDROMORPHONE HCL PF 1 MG/ML IJ SOLN
0.5000 mg | INTRAMUSCULAR | Status: DC | PRN
  Administered 2012-06-02: 0.5 mg via INTRAVENOUS

## 2012-06-02 MED ORDER — COLESEVELAM HCL 3.75 G PO PACK: 3.7500 g | PACK | Freq: Every day | ORAL | Status: DC

## 2012-06-02 MED ORDER — ACETAMINOPHEN 10 MG/ML IV SOLN
1000.0000 mg | Freq: Four times a day (QID) | INTRAVENOUS | Status: AC
  Administered 2012-06-02 – 2012-06-03 (×4): 1000 mg via INTRAVENOUS
  Filled 2012-06-02 (×4): qty 100

## 2012-06-02 MED ORDER — CELECOXIB 200 MG PO CAPS
200.0000 mg | ORAL_CAPSULE | Freq: Two times a day (BID) | ORAL | Status: DC
  Administered 2012-06-02 – 2012-06-04 (×4): 200 mg via ORAL
  Filled 2012-06-02 (×5): qty 1

## 2012-06-02 MED ORDER — BISACODYL 5 MG PO TBEC: 5.0000 mg | DELAYED_RELEASE_TABLET | Freq: Every day | ORAL | Status: DC | PRN

## 2012-06-02 MED ORDER — BUPIVACAINE-EPINEPHRINE PF 0.25-1:200000 % IJ SOLN
INTRAMUSCULAR | Status: AC
  Filled 2012-06-02: qty 30

## 2012-06-02 MED ORDER — DIPHENHYDRAMINE HCL 12.5 MG/5ML PO ELIX: 12.5000 mg | ORAL_SOLUTION | ORAL | Status: DC | PRN

## 2012-06-02 MED ORDER — LACTATED RINGERS IV SOLN
INTRAVENOUS | Status: DC | PRN
  Administered 2012-06-02 (×3): via INTRAVENOUS

## 2012-06-02 MED ORDER — GLYCOPYRROLATE 0.2 MG/ML IJ SOLN
INTRAMUSCULAR | Status: DC | PRN
  Administered 2012-06-02: .6 mg via INTRAVENOUS

## 2012-06-02 MED ORDER — ZOLPIDEM TARTRATE 5 MG PO TABS: 5.0000 mg | ORAL_TABLET | Freq: Every evening | ORAL | Status: DC | PRN

## 2012-06-02 MED ORDER — OXYCODONE HCL 5 MG PO TABS: 5.0000 mg | ORAL_TABLET | Freq: Once | ORAL | Status: DC | PRN

## 2012-06-02 MED ORDER — FENTANYL CITRATE 0.05 MG/ML IJ SOLN
INTRAMUSCULAR | Status: DC | PRN
  Administered 2012-06-02 (×2): 25 ug via INTRAVENOUS
  Administered 2012-06-02 (×2): 50 ug via INTRAVENOUS
  Administered 2012-06-02: 100 ug via INTRAVENOUS
  Administered 2012-06-02: 200 ug via INTRAVENOUS
  Administered 2012-06-02 (×2): 50 ug via INTRAVENOUS
  Administered 2012-06-02 (×2): 100 ug via INTRAVENOUS

## 2012-06-02 MED ORDER — ONDANSETRON HCL 4 MG/2ML IJ SOLN
INTRAMUSCULAR | Status: DC | PRN
  Administered 2012-06-02: 4 mg via INTRAVENOUS

## 2012-06-02 MED ORDER — VITAMIN D 50 MCG (2000 UT) PO TABS: 2000.0000 [IU] | ORAL_TABLET | Freq: Every day | ORAL | Status: DC

## 2012-06-02 MED ORDER — COLESEVELAM HCL 625 MG PO TABS
3750.0000 mg | ORAL_TABLET | Freq: Every day | ORAL | Status: DC
  Filled 2012-06-02: qty 6

## 2012-06-02 MED ORDER — PHENOL 1.4 % MT LIQD: 1.0000 | OROMUCOSAL | Status: DC | PRN

## 2012-06-02 MED ORDER — VITAMIN D3 25 MCG (1000 UNIT) PO TABS
2000.0000 [IU] | ORAL_TABLET | Freq: Every day | ORAL | Status: DC
  Administered 2012-06-02 – 2012-06-04 (×3): 2000 [IU] via ORAL
  Filled 2012-06-02 (×3): qty 2

## 2012-06-02 MED ORDER — MEPERIDINE HCL 25 MG/ML IJ SOLN: 6.2500 mg | INTRAMUSCULAR | Status: DC | PRN

## 2012-06-02 MED ORDER — BUPROPION HCL ER (XL) 300 MG PO TB24
300.0000 mg | ORAL_TABLET | Freq: Every day | ORAL | Status: DC
  Administered 2012-06-02 – 2012-06-04 (×3): 300 mg via ORAL
  Filled 2012-06-02 (×3): qty 1

## 2012-06-02 MED ORDER — HYDROMORPHONE HCL PF 1 MG/ML IJ SOLN
1.0000 mg | INTRAMUSCULAR | Status: DC | PRN
  Administered 2012-06-03 (×2): 1 mg via INTRAVENOUS
  Filled 2012-06-02 (×2): qty 1

## 2012-06-02 MED ORDER — LACTATED RINGERS IV SOLN
INTRAVENOUS | Status: DC
  Administered 2012-06-02: 11:00:00 via INTRAVENOUS

## 2012-06-02 MED ORDER — ACETAMINOPHEN 325 MG PO TABS
650.0000 mg | ORAL_TABLET | Freq: Four times a day (QID) | ORAL | Status: DC | PRN
  Filled 2012-06-02: qty 2

## 2012-06-02 MED ORDER — BISOPROLOL-HYDROCHLOROTHIAZIDE 10-6.25 MG PO TABS
1.0000 | ORAL_TABLET | Freq: Every day | ORAL | Status: DC
  Filled 2012-06-02 (×2): qty 1

## 2012-06-02 MED ORDER — BUPIVACAINE-EPINEPHRINE PF 0.25-1:200000 % IJ SOLN
INTRAMUSCULAR | Status: DC | PRN
  Administered 2012-06-02: 20 mL

## 2012-06-02 MED ORDER — OXYCODONE HCL 5 MG/5ML PO SOLN: 5.0000 mg | Freq: Once | ORAL | Status: DC | PRN

## 2012-06-02 MED ORDER — NEOSTIGMINE METHYLSULFATE 1 MG/ML IJ SOLN
INTRAMUSCULAR | Status: DC | PRN
  Administered 2012-06-02: 5 mg via INTRAVENOUS

## 2012-06-02 MED ORDER — ARTIFICIAL TEARS OP OINT
TOPICAL_OINTMENT | OPHTHALMIC | Status: DC | PRN
  Administered 2012-06-02: 1 via OPHTHALMIC

## 2012-06-02 MED ORDER — ALUM & MAG HYDROXIDE-SIMETH 200-200-20 MG/5ML PO SUSP
30.0000 mL | ORAL | Status: DC | PRN
  Administered 2012-06-03: 30 mL via ORAL
  Filled 2012-06-02: qty 30

## 2012-06-02 MED ORDER — EPHEDRINE SULFATE 50 MG/ML IJ SOLN
INTRAMUSCULAR | Status: DC | PRN
  Administered 2012-06-02: 10 mg via INTRAVENOUS

## 2012-06-02 MED ORDER — ALBUTEROL SULFATE HFA 108 (90 BASE) MCG/ACT IN AERS
2.0000 | INHALATION_SPRAY | Freq: Four times a day (QID) | RESPIRATORY_TRACT | Status: DC | PRN
Start: 2012-06-02 — End: 2012-06-04
  Filled 2012-06-02: qty 6.7

## 2012-06-02 MED ORDER — OXYCODONE HCL 5 MG PO TABS
ORAL_TABLET | ORAL | Status: AC
  Filled 2012-06-02: qty 2

## 2012-06-02 MED ORDER — MAGNESIUM HYDROXIDE 400 MG/5ML PO SUSP: 30.0000 mL | Freq: Every day | ORAL | Status: DC | PRN

## 2012-06-02 MED ORDER — LIDOCAINE HCL (CARDIAC) 20 MG/ML IV SOLN
INTRAVENOUS | Status: DC | PRN
  Administered 2012-06-02: 60 mg via INTRAVENOUS

## 2012-06-02 MED ORDER — CELECOXIB 200 MG PO CAPS
ORAL_CAPSULE | ORAL | Status: AC
  Filled 2012-06-02: qty 1

## 2012-06-02 SURGICAL SUPPLY — 53 items
BLADE SAW SAG 73X25 THK (BLADE) ×1
BLADE SAW SGTL 18X1.27X75 (BLADE) IMPLANT
BLADE SAW SGTL 73X25 THK (BLADE) ×1 IMPLANT
BLADE SAW SGTL MED 73X18.5 STR (BLADE) IMPLANT
BRUSH FEMORAL CANAL (MISCELLANEOUS) IMPLANT
CLOTH BEACON ORANGE TIMEOUT ST (SAFETY) ×2 IMPLANT
COVER BACK TABLE 24X17X13 BIG (DRAPES) IMPLANT
COVER SURGICAL LIGHT HANDLE (MISCELLANEOUS) ×4 IMPLANT
DRAPE ORTHO SPLIT 77X108 STRL (DRAPES) ×2
DRAPE PROXIMA HALF (DRAPES) ×2 IMPLANT
DRAPE SURG ORHT 6 SPLT 77X108 (DRAPES) ×1 IMPLANT
DRAPE U-SHAPE 47X51 STRL (DRAPES) ×2 IMPLANT
DRILL BIT 7/64X5 (BIT) ×2 IMPLANT
DRSG MEPILEX BORDER 4X12 (GAUZE/BANDAGES/DRESSINGS) ×2 IMPLANT
DRSG MEPILEX BORDER 4X8 (GAUZE/BANDAGES/DRESSINGS) ×2 IMPLANT
DURAPREP 26ML APPLICATOR (WOUND CARE) ×2 IMPLANT
ELECT BLADE 4.0 EZ CLEAN MEGAD (MISCELLANEOUS)
ELECT REM PT RETURN 9FT ADLT (ELECTROSURGICAL) ×2
ELECTRODE BLDE 4.0 EZ CLN MEGD (MISCELLANEOUS) IMPLANT
ELECTRODE REM PT RTRN 9FT ADLT (ELECTROSURGICAL) ×1 IMPLANT
GAUZE XEROFORM 1X8 LF (GAUZE/BANDAGES/DRESSINGS) ×2 IMPLANT
GLOVE BIO SURGEON STRL SZ7 (GLOVE) ×2 IMPLANT
GLOVE BIO SURGEON STRL SZ7.5 (GLOVE) ×2 IMPLANT
GLOVE BIOGEL PI IND STRL 7.0 (GLOVE) ×1 IMPLANT
GLOVE BIOGEL PI IND STRL 8 (GLOVE) ×1 IMPLANT
GLOVE BIOGEL PI INDICATOR 7.0 (GLOVE) ×1
GLOVE BIOGEL PI INDICATOR 8 (GLOVE) ×1
GOWN PREVENTION PLUS XLARGE (GOWN DISPOSABLE) ×2 IMPLANT
GOWN STRL NON-REIN LRG LVL3 (GOWN DISPOSABLE) ×4 IMPLANT
HANDPIECE INTERPULSE COAX TIP (DISPOSABLE)
HOOD PEEL AWAY FACE SHEILD DIS (HOOD) ×4 IMPLANT
KIT BASIN OR (CUSTOM PROCEDURE TRAY) ×2 IMPLANT
KIT ROOM TURNOVER OR (KITS) ×2 IMPLANT
MANIFOLD NEPTUNE II (INSTRUMENTS) ×2 IMPLANT
NEEDLE 22X1 1/2 (OR ONLY) (NEEDLE) ×2 IMPLANT
NS IRRIG 1000ML POUR BTL (IV SOLUTION) ×2 IMPLANT
PACK TOTAL JOINT (CUSTOM PROCEDURE TRAY) ×2 IMPLANT
PAD ARMBOARD 7.5X6 YLW CONV (MISCELLANEOUS) ×4 IMPLANT
PASSER SUT SWANSON 36MM LOOP (INSTRUMENTS) ×2 IMPLANT
PRESSURIZER FEMORAL UNIV (MISCELLANEOUS) IMPLANT
SET HNDPC FAN SPRY TIP SCT (DISPOSABLE) IMPLANT
SUT ETHIBOND 2 V 37 (SUTURE) ×2 IMPLANT
SUT ETHILON 3 0 FSL (SUTURE) ×2 IMPLANT
SUT VIC AB 0 CTB1 27 (SUTURE) ×2 IMPLANT
SUT VIC AB 1 CTX 36 (SUTURE) ×2
SUT VIC AB 1 CTX36XBRD ANBCTR (SUTURE) ×1 IMPLANT
SUT VIC AB 2-0 CTB1 (SUTURE) ×2 IMPLANT
SYR CONTROL 10ML LL (SYRINGE) ×2 IMPLANT
TOWEL OR 17X24 6PK STRL BLUE (TOWEL DISPOSABLE) ×2 IMPLANT
TOWEL OR 17X26 10 PK STRL BLUE (TOWEL DISPOSABLE) ×2 IMPLANT
TOWER CARTRIDGE SMART MIX (DISPOSABLE) IMPLANT
TRAY FOLEY CATH 14FR (SET/KITS/TRAYS/PACK) IMPLANT
WATER STERILE IRR 1000ML POUR (IV SOLUTION) ×8 IMPLANT

## 2012-06-02 NOTE — Preoperative (Signed)
Beta Blockers   Reason not to administer Beta Blockers:beta blocker taken this am

## 2012-06-02 NOTE — Anesthesia Preprocedure Evaluation (Addendum)
Anesthesia Evaluation  Patient identified by MRN, date of birth, ID band Patient awake    Reviewed: Allergy & Precautions, H&P , NPO status , Patient's Chart, lab work & pertinent test results, reviewed documented beta blocker date and time   History of Anesthesia Complications Negative for: history of anesthetic complications  Airway Mallampati: II TM Distance: >3 FB Neck ROM: Full    Dental  (+) Caps and Dental Advisory Given   Pulmonary Current Smoker,  breath sounds clear to auscultation  Pulmonary exam normal       Cardiovascular hypertension, Pt. on medications and Pt. on home beta blockers Rhythm:Regular Rate:Normal     Neuro/Psych PSYCHIATRIC DISORDERS Anxiety Chronic back pain: narcotics daily    GI/Hepatic negative GI ROS, Neg liver ROS,   Endo/Other  diabetes (diet management only, glu 82), Well Controlled  Renal/GU negative Renal ROS     Musculoskeletal  (+) Arthritis -, Osteoarthritis,    Abdominal (+) + obese,   Peds  Hematology negative hematology ROS (+)   Anesthesia Other Findings   Reproductive/Obstetrics                           Anesthesia Physical Anesthesia Plan  ASA: II  Anesthesia Plan: General   Post-op Pain Management:    Induction: Intravenous  Airway Management Planned: Oral ETT  Additional Equipment:   Intra-op Plan:   Post-operative Plan: Extubation in OR  Informed Consent: I have reviewed the patients History and Physical, chart, labs and discussed the procedure including the risks, benefits and alternatives for the proposed anesthesia with the patient or authorized representative who has indicated his/her understanding and acceptance.   Dental advisory given  Plan Discussed with: CRNA and Surgeon  Anesthesia Plan Comments: (Plan routine monitors, GETA)        Anesthesia Quick Evaluation

## 2012-06-02 NOTE — Transfer of Care (Signed)
Immediate Anesthesia Transfer of Care Note  Patient: [name]  Procedure(s) Performed: Procedure(s) (LRB) with comments: TOTAL HIP ARTHROPLASTY (Right)  Patient Location: PACU  Anesthesia Type:General  Level of Consciousness: sedated and patient cooperative  Airway & Oxygen Therapy: Patient Spontanous Breathing and Patient connected to face mask oxygen  Post-op Assessment: Report given to PACU RN, Post -op Vital signs reviewed and stable and Patient moving all extremities X 4  Post vital signs: Reviewed and stable  Complications: No apparent anesthesia complications

## 2012-06-02 NOTE — Anesthesia Postprocedure Evaluation (Signed)
Anesthesia Post Note  Patient: [name]  Procedure(s) Performed: Procedure(s) (LRB): TOTAL HIP ARTHROPLASTY (Right)  Anesthesia type: general  Patient location: PACU  Post pain: Pain level controlled  Post assessment: Patient's Cardiovascular Status Stable  Last Vitals:  Filed Vitals:   06/02/12 1612  BP: 155/64  Pulse:   Temp:   Resp:     Post vital signs: Reviewed and stable  Level of consciousness: sedated  Complications: No apparent anesthesia complications

## 2012-06-02 NOTE — Op Note (Signed)
OPERATIVE REPORT    DATE OF PROCEDURE:  06/02/2012       PREOPERATIVE DIAGNOSIS:  OSTEOARTHRITIS RIGHT HIP                                                          POSTOPERATIVE DIAGNOSIS:  OSTEOARTHRITIS RIGHT HIP                                                           PROCEDURE:  R total hip arthroplasty using a 52 mm DePuy Pinnacle  Cup, [employer], 10-degree polyethylene liner index superior  and posterior, a +3 36 mm ceramic head, a 18x13x160x36 SROM stem, 18DL Cone   SURGEON: Ming Mcmannis J    ASSISTANT:   Mauricia Area, PA-C  (present throughout entire procedure and necessary for timely completion of the procedure)   ANESTHESIA: General BLOOD LOSS: 300 FLUID REPLACEMENT: 1500 crystalloid DRAINS: Foley Catheter URINE OUTPUT: 300cc COMPLICATIONS: none    INDICATIONS FOR PROCEDURE: A 61 y.o. year-old With  OSTEOARTHRITIS RIGHT HIP   for 3 years, x-rays show bone-on-bone arthritic changes. Despite conservative measures with observation, anti-inflammatory medicine, narcotics, use of a cane, has severe unremitting pain and can ambulate only a few blocks before resting.  Patient desires elective R total hip arthroplasty to decrease pain and increase function. The risks, benefits, and alternatives were discussed at length including but not limited to the risks of infection, bleeding, nerve injury, stiffness, blood clots, the need for revision surgery, cardiopulmonary complications, among others, and they were willing to proceed.y have been discussed. Questions answered.     PROCEDURE IN DETAIL: The patient was identified by armband,  received preoperative IV antibiotics in the holding area at [facility], taken to the operating room , appropriate anesthetic monitors  were attached and general endotracheal anesthesia induced. Foley catheter was inserted. Pt was rolled into the L lateral decubitus position and fixed there with a Stulberg Mark II pelvic clamp and the  R lower extremity was then prepped and draped  in the usual sterile fashion from the ankle to the hemipelvis. A time-out  procedure was performed. The skin along the lateral hip and thigh  infiltrated with 10 mL of 0.5% Marcaine and epinephrine solution. We  then made a posterolateral approach to the hip. With a #10 blade, 14 cm  incision through skin and subcutaneous tissue down to the level of the  IT band. Small bleeders were identified and cauterized. IT band cut in  line with skin incision exposing the greater trochanter. A Cobra retractor was placed between the gluteus minimus and the superior hip joint capsule, and a spiked Cobra between the quadratus femoris and the inferior hip joint capsule. This isolated the short  external rotators and piriformis tendons. These were tagged with a #2 Ethibond  suture and cut off their insertion on the intertrochanteric crest. The posterior  capsule was then developed into an acetabular-based flap from Posterior Superior off of the acetabulum out over the femoral neck and back posterior inferior to the acetabular rim. This flap was tagged with two #2 Ethibond sutures and retracted protecting  the sciatic nerve. This exposed the arthritic femoral head and osteophytes. The hip was then flexed and internally rotated, dislocating the femoral head and a standard neck cut performed 1 fingerbreadth above the lesser trochanter.  A spiked Cobra was placed in the cotyloid notch and a Hohmann retractor was then used to lever the femur anteriorly off of the anterior pelvic column. A posterior-inferior wing retractor was placed at the junction of the acetabulum and the ischium completing the acetabular exposure.We then removed the peripheral osteophytes and labrum from the acetabulum. We then reamed the acetabulum up to 51 mm with basket reamers obtaining good coverage in all quadrants, irrigated out with normal  saline solution and hammered into place a 52 mm pinnacle cup  in 45  degrees of abduction and about 20 degrees of anteversion. More  peripheral osteophytes removed and a trial 10-degree liner placed with the  index superior-posterior. The hip was then flexed and internally rotated exposing the  proximal femur, which was entered with the initiating reamer followed by  the axial reamers up to a 13.5 mm full depth and 14mm partial depth. We then conically reamed to 18DL to the correct depth for a 42 base neck. The calcar was milled to 18DL. A trial cone and stem was inserted in the 25 degrees anteversion, with a +0 36mm trial head. Trial reduction was then performed and excellent stability was noted with at 90 of flexion with 75 of internal rotation and then full extension with maximal external rotation. The hip could not be dislocated in full extension, but felt slightly tight when he attempted to flex the knee. We then inserted a -336 mm trial head and. the knee could easily flex  to about 120 degrees. We also stretched the abductors at this point,  because of the preexisting adductor contractures. All trial components  were then removed. The acetabulum was irrigated out with normal saline  solution. A titanium Apex [facility] was then screwed into place  followed by a 10-degree polyethylene liner index superior-posterior. On  the femoral side a 18DL ZTT1 cone was hammered into place, followed by a 18x13x160x36 SROM stem in 25 degrees of anteversion. At this point, a +336 mm ceramic head was  hammered on the stem, getting the equivalent of a nk -3 36 mm head with a 42 base neck. The hip was reduced. We checked our stability  one more time and found to be excellent. The wound was once again  thoroughly irrigated out with normal saline solution pulse lavage. The  capsular flap and short external rotators were repaired back to the  intertrochanteric crest through drill holes with a #2 Ethibond suture.  The IT band was closed with running 1 Vicryl suture. The  subcutaneous  tissue with 0 and 2-0 undyed Vicryl suture and the skin with running  interlocking 3-0 nylon suture. Dressing of Xeroform and Mepilex was  then applied. The patient was then unclamped, rolled supine, awaken extubated and taken to recovery room without difficulty in stable condition.   Gean Birchwood J 06/02/2012, 2:25 PM

## 2012-06-02 NOTE — Interval H&P Note (Signed)
History and Physical Interval Note:  06/02/2012 12:51 PM  [name]  has presented today for surgery, with the diagnosis of OSTEOARTHRITIS RIGHT HIP  The various methods of treatment have been discussed with the patient and family. After consideration of risks, benefits and other options for treatment, the patient has consented to  Procedure(s) (LRB) with comments: TOTAL HIP ARTHROPLASTY (Right) as a surgical intervention .  The patient's history has been reviewed, patient examined, no change in status, stable for surgery.  I have reviewed the patient's chart and labs.  Questions were answered to the patient's satisfaction.     Nestor Lewandowsky

## 2012-06-02 NOTE — Progress Notes (Signed)
Orthopedic Tech Progress Note Patient Details:  [name] 05-15-1951 [id number] Applied overhead frame and trapeze bar to patient bed. Patient ID: [name], female   DOB: 1950-12-31, 61 y.o.   MRN: [id number]   Jennye Moccasin 06/02/2012, 8:11 PM

## 2012-06-03 ENCOUNTER — Encounter (HOSPITAL_COMMUNITY): Payer: Self-pay | Admitting: Orthopedic Surgery

## 2012-06-03 LAB — BASIC METABOLIC PANEL
BUN: 8 mg/dL (ref 6–23)
GFR calc non Af Amer: >90 mL/min (ref >90)
Glucose, Bld: 130 mg/dL — ABNORMAL HIGH (ref 70–99)
Potassium: 4.5 mEq/L (ref 3.5–5.1)

## 2012-06-03 LAB — CBC
HCT: 30.9 % — ABNORMAL LOW (ref 36.0–46.0)
Hemoglobin: 10.7 g/dL — ABNORMAL LOW (ref 12.0–15.0)
MCHC: 34.6 g/dL (ref 30.0–36.0)
MCV: 76.9 fL — ABNORMAL LOW (ref 78.0–100.0)

## 2012-06-03 MED ORDER — COLESEVELAM HCL 625 MG PO TABS
312.5000 mg | ORAL_TABLET | Freq: Every day | ORAL | Status: DC
  Administered 2012-06-04: 312.5 mg via ORAL
  Filled 2012-06-03 (×5): qty 0.5

## 2012-06-03 NOTE — Progress Notes (Signed)
Physical Therapy Treatment Patient Details Name: [name] MRN: [id number] DOB: [birth year] Today's Date: 06/03/2012 Time: 4098-1191 PT Time Calculation (min): 26 min  PT Assessment / Plan / Recommendation Comments on Treatment Session  Pt with better pain control this pm. Required cues to state 3/3 hip precautions and cues during mobility to adhere to precautions. Anticipate will be ready for stair training in a.m.     Follow Up Recommendations  Home health PT;Supervision for mobility/OOB     Does the patient have the potential to tolerate intense rehabilitation     Barriers to Discharge        Equipment Recommendations  None recommended by PT    Recommendations for Other Services OT consult  Frequency 7X/week   Plan Discharge plan remains appropriate;Frequency remains appropriate    Precautions / Restrictions Precautions Precautions: Posterior Hip Restrictions RLE Weight Bearing: Weight bearing as tolerated   Pertinent Vitals/Pain 5/10 at hip pre-activity; 9/10 at hip after activity; RN in to provide pain meds    Mobility  Bed Mobility Bed Mobility: Supine to Sit;Sitting - Scoot to [employer] of Bed;Sit to Supine;Scooting to [facility] Supine to Sit: 5: Supervision;With rails;HOB elevated (HOB 15) Sitting - Scoot to Edge of Bed: 5: Supervision Sit to Supine: 4: Min assist;HOB flat Scooting to [facility]: 5: Supervision;With trapeze Details for Bed Mobility Assistance: vc for technique to maintain hip precautions; assist to raise leg onto bed and instruction not to cross Lt foot under Rt to assist (as pt was attempting to do) Transfers Transfers: Sit to Stand;Stand to Sit Sit to Stand: 4: Min guard;With upper extremity assist;From bed Stand to Sit: 4: Min guard;With upper extremity assist;To bed Details for Transfer Assistance: vc for safe use of RW and to maintain hip precautions Ambulation/Gait Ambulation/Gait Assistance: 4: Min guard Ambulation Distance (Feet): 80  Feet Assistive device: Rolling walker Ambulation/Gait Assistance Details: vc for proper, safe use of RW and to incr weight bearing on RLE Gait Pattern: Step-through pattern;Decreased step length - right;Decreased stance time - right;Decreased hip/knee flexion - right;Antalgic    Exercises Total Joint Exercises Ankle Circles/Pumps: AROM;Both;10 reps;Supine Quad Sets: AROM;Right;10 reps;Supine Heel Slides: AROM;AAROM;Right;10 reps;Supine   PT Diagnosis:    PT Problem List:   PT Treatment Interventions:     PT Goals Acute Rehab PT Goals Pt will go Supine/Side to Sit: with HOB 0 degrees;with supervision PT Goal: Supine/Side to Sit - Progress: Progressing toward goal Pt will go Sit to Supine/Side: with supervision;with HOB 0 degrees PT Goal: Sit to Supine/Side - Progress: Progressing toward goal Pt will go Sit to Stand: with upper extremity assist;with supervision PT Goal: Sit to Stand - Progress: Progressing toward goal Pt will go Stand to Sit: with upper extremity assist;with supervision PT Goal: Stand to Sit - Progress: Progressing toward goal Pt will Ambulate: >150 feet;with supervision;with least restrictive assistive device PT Goal: Ambulate - Progress: Progressing toward goal Pt will Perform Home Exercise Program: with supervision, verbal cues required/provided PT Goal: Perform Home Exercise Program - Progress: Progressing toward goal Additional Goals Additional Goal #1: Pt will verbalize and adhere to posterior hip precautions PT Goal: Additional Goal #1 - Progress: Progressing toward goal  Visit Information  Last PT Received On: 06/03/12 Assistance Needed: +1    Subjective Data  Subjective: Reports she had a muscle relaxer and feels much better Patient Stated Goal: decr pain and return home in "next couple of days"   Cognition  Overall Cognitive Status: Appears within functional limits for tasks assessed/performed  Arousal/Alertness: Awake/alert Orientation Level:  Oriented X4 / Intact Behavior During Session: WFL for tasks performed    Balance     End of Session PT - End of Session Equipment Utilized During Treatment: Gait belt Activity Tolerance: Patient tolerated treatment well Patient left: in bed;with call bell/phone within reach;with family/visitor present   GP     Lummie Montijo 06/03/2012, 3:08 PM Pager [phone]

## 2012-06-03 NOTE — Progress Notes (Signed)
Patient ID: [name], female   DOB: 1951/01/28, 61 y.o.   MRN: [id number] PATIENT ID: [name]  MRN: [id number]  DOB/AGE:  Aug 28, 1950 / 61 y.o.  61 y.o. 1 Day Post-Op Procedure(s) (LRB): TOTAL HIP ARTHROPLASTY (Right)    PROGRESS NOTE Subjective: Patient is alert, oriented,x2 Nausea, X1 Vomiting, yes passing gas, no Bowel Movement. Taking PO sips. Denies SOB, Chest or Calf Pain. Using Incentive Spirometer, PAS in place. Ambulate WBAT today Patient reports pain as 9 on 0-10 scale  .    Objective: Vital signs in last 24 hours: Filed Vitals:   06/02/12 1700 06/02/12 2000 06/02/12 2342 06/03/12 0659  BP: 145/82  142/46 145/62  Pulse: 78  65 60  Temp: 98.2 F (36.8 C)  97.9 F (36.6 C) 98.9 F (37.2 C)  TempSrc:      Resp: 14 16 18 18   SpO2: 95% 98% 98% 99%      Intake/Output from previous day: I/O last 3 completed shifts: In: 2750 [I.V.:2750] Out: 650 [Urine:500; Blood:150]   Intake/Output this shift:     LABORATORY DATA:  Basename 06/03/12 0555  WBC 5.7  HGB 10.7*  HCT 30.9*  PLT 211  NA --  K --  CL --  CO2 --  BUN --  CREATININE --  GLUCOSE --  GLUCAP --  INR --  CALCIUM --    Examination: Neurologically intact ABD soft Neurovascular intact Sensation intact distally Intact pulses distally Dorsiflexion/Plantar flexion intact Incision: moderate drainage No cellulitis present Compartment soft, dressing changed yesterday XR AP&Lat of hip shows well placed\fixed THA  Assessment:   61 y.o. 1 Day Post-Op Procedure(s) (LRB): TOTAL HIP ARTHROPLASTY (Right) ADDITIONAL DIAGNOSIS:    Plan: PT/OT WBAT, THA  posterior precautions  DVT Prophylaxis: SCDx72 hrs, ASA 325 mg BID x 2 weeks  DISCHARGE PLAN: Home  DISCHARGE NEEDS: HHPT, HHRN, CPM, Walker and 3-in-1 comode seat

## 2012-06-03 NOTE — Progress Notes (Signed)
CARE MANAGEMENT NOTE 06/03/2012  Patient:  [name], [name]   Account Number:  [id number]  Date Initiated:  06/03/2012  Documentation initiated by:  Vance Peper  Subjective/Objective Assessment:   61 yr old female s/p right total hip arthroplasty.     Action/Plan:   CM spoke with patient concerning home health and DME needs. Preoperatively setup with [facility], no changes. Patient has rolling walker, states she has elevated toilet and doesnt need 3in1.   Anticipated DC Date:  06/05/2012   Anticipated DC Plan:  HOME W HOME HEALTH SERVICES      DC Planning Services  CM consult      [facility] Choice  HOME HEALTH   Choice offered to / List presented to:  C-1 Patient        HH arranged  HH-2 PT      [facility] agency  [facility]   Status of service:  Completed, signed off Medicare Important Message given?   (If response is "NO", the following Medicare IM given date fields will be blank) Date Medicare IM given:   Date Additional Medicare IM given:    Discharge Disposition:  HOME W HOME HEALTH SERVICES  Per UR Regulation:    If discussed at Long Length of Stay Meetings, dates discussed:    Comments:

## 2012-06-03 NOTE — Evaluation (Signed)
Physical Therapy Evaluation Patient Details Name: [name] MRN: [id number] DOB: 10/10/1950 Today's Date: 06/03/2012 Time: 2440-1027 PT Time Calculation (min): 31 min  PT Assessment / Plan / Recommendation Clinical Impression  61 yo s/p Rt THR with dependencies in mobility due to pain and adhering to hip precautions. Pt will benefit from PT to incr safety and independence with mobility priror to d/c home.    PT Assessment  Patient needs continued PT services    Follow Up Recommendations  Home health PT;Supervision for mobility/OOB    Does the patient have the potential to tolerate intense rehabilitation      Barriers to Discharge None      Equipment Recommendations  None recommended by PT    Recommendations for Other Services OT consult   Frequency 7X/week    Precautions / Restrictions Precautions Precautions: Posterior Hip Restrictions Weight Bearing Restrictions: Yes RLE Weight Bearing: Weight bearing as tolerated   Pertinent Vitals/Pain 10/10 Rt hip with movement, although states it feels better with getting up and walking.      Mobility  Bed Mobility Bed Mobility: Supine to Sit;Sitting - Scoot to Edge of Bed Supine to Sit: 4: Min assist;HOB flat Sitting - Scoot to [place] of Bed: 5: Supervision Details for Bed Mobility Assistance: vc for technique to maintain precautions; assist to raise torso Transfers Transfers: Sit to Stand;Stand to Sit Sit to Stand: 4: Min assist;With upper extremity assist;From bed Stand to Sit: 4: Min assist;With upper extremity assist;With armrests;To chair/3-in-1 Details for Transfer Assistance: assist and cues to maintain hip precautions Ambulation/Gait Ambulation/Gait Assistance: 4: Min guard Ambulation Distance (Feet): 80 Feet Assistive device: Rolling walker Ambulation/Gait Assistance Details: vc for sequencing and to maintain hip precautions when turning Gait Pattern: Step-to pattern;Decreased hip/knee flexion -  right;Decreased weight shift to right;Right foot flat;Antalgic    Shoulder Instructions     Exercises Total Joint Exercises Ankle Circles/Pumps: AROM;Both;20 reps;Seated Quad Sets: AROM;Right;10 reps;Supine Heel Slides: AROM;AAROM;Right;10 reps;Supine   PT Diagnosis: Difficulty walking;Acute pain  PT Problem List: Decreased range of motion;Decreased mobility;Decreased knowledge of use of DME;Decreased knowledge of precautions;Pain PT Treatment Interventions: DME instruction;Gait training;Stair training;Functional mobility training;Therapeutic activities;Therapeutic exercise;Patient/family education   PT Goals Acute Rehab PT Goals PT Goal Formulation: With patient Time For Goal Achievement: 06/06/12 Potential to Achieve Goals: Good Pt will go Supine/Side to Sit: with HOB 0 degrees;with supervision PT Goal: Supine/Side to Sit - Progress: Goal set today Pt will go Sit to Supine/Side: with supervision;with HOB 0 degrees PT Goal: Sit to Supine/Side - Progress: Goal set today Pt will go Sit to Stand: with upper extremity assist;with supervision PT Goal: Sit to Stand - Progress: Goal set today Pt will go Stand to Sit: with upper extremity assist;with supervision PT Goal: Stand to Sit - Progress: Goal set today Pt will Ambulate: >150 feet;with supervision;with least restrictive assistive device PT Goal: Ambulate - Progress: Goal set today Pt will Go Up / Down Stairs: 1-2 stairs;with min assist;with least restrictive assistive device PT Goal: Up/Down Stairs - Progress: Goal set today Pt will Perform Home Exercise Program: with supervision, verbal cues required/provided PT Goal: Perform Home Exercise Program - Progress: Goal set today Additional Goals Additional Goal #1: Pt will verbalize and adhere to posterior hip precautions PT Goal: Additional Goal #1 - Progress: Goal set today  Visit Information  Last PT Received On: 06/03/12 Assistance Needed: +1    Subjective Data  Subjective:  Pt reports she was hurting too much to get up with PT yesterday (  06/02/12) Patient Stated Goal: decr pain and return home in "next couple of days"   Prior Functioning  Home Living Lives With: Spouse;Other (Comment) (brother) Available Help at Discharge: Family;Available 24 hours/day Type of Home: House Home Access: Stairs to enter [employer] of Steps: 2 Entrance Stairs-Rails: None Home Layout: Two level;Able to live on main level with bedroom/bathroom Bathroom Shower/Tub: Tub/shower unit;Door [employer] Toilet: Standard Bathroom Accessibility: Yes How Accessible: Accessible via walker Home Adaptive Equipment: Walker - rolling;Straight cane Prior Function Level of Independence: Independent with assistive device(s) Able to Take Stairs?: Yes Driving: Yes Vocation: Other (comment) (home first 6 months of year caring for husband-now better) Communication Communication: No difficulties Dominant Hand: Right    Cognition  Overall Cognitive Status: Appears within functional limits for tasks assessed/performed Arousal/Alertness: Awake/alert Orientation Level: Oriented X4 / Intact Behavior During Session: [facility] for tasks performed    Extremity/Trunk Assessment Right Lower Extremity Assessment RLE ROM/Strength/Tone: Deficits;Due to pain;Due to precautions RLE ROM/Strength/Tone Deficits: AAROM supine within hip precautions limitations; grossly 3-/5 due to pain RLE Sensation: WFL - Light Touch RLE Coordination: WFL - gross motor Left Lower Extremity Assessment LLE ROM/Strength/Tone: WFL for tasks assessed LLE Sensation: WFL - Light Touch LLE Coordination: WFL - gross motor Trunk Assessment Trunk Assessment: Normal   Balance    End of Session PT - End of Session Equipment Utilized During Treatment: Gait belt Activity Tolerance: Patient tolerated treatment well Patient left: in chair;with call bell/phone within reach Nurse Communication: Mobility status  GP      [name] 06/03/2012, 11:12 AM  Pager [phone]

## 2012-06-03 NOTE — Progress Notes (Signed)
UR COMPLETED  

## 2012-06-04 LAB — CBC
HCT: 29.9 % — ABNORMAL LOW (ref 36.0–46.0)
Hemoglobin: 10.2 g/dL — ABNORMAL LOW (ref 12.0–15.0)
MCH: 26.1 pg (ref 26.0–34.0)
MCHC: 34.1 g/dL (ref 30.0–36.0)

## 2012-06-04 MED ORDER — ASPIRIN 325 MG PO TBEC: 325.0000 mg | DELAYED_RELEASE_TABLET | Freq: Two times a day (BID) | ORAL | Status: DC

## 2012-06-04 MED ORDER — OXYCODONE-ACETAMINOPHEN 7.5-325 MG PO TABS: 1.0000 | ORAL_TABLET | ORAL | Status: DC | PRN

## 2012-06-04 NOTE — Discharge Summary (Signed)
Patient ID: [name] MRN: [id number] DOB/AGE: 61-Oct-1952 61 y.o.  Admit date: 06/02/2012 Discharge date: 06/04/2012  Admission Diagnoses:  Active Problems:  * No active hospital problems. *    Discharge Diagnoses:  Same  Past Medical History  Diagnosis Date  . Hypertension   . Hyperlipidemia   . Anxiety   . Diabetes mellitus without complication     controlled by diet  . Arthritis     osteoarthritis    Surgeries: Procedure(s): TOTAL HIP ARTHROPLASTY on 06/02/2012   Consultants:    Discharged Condition: Improved  Hospital Course: [name] is an 61 y.o. female who was admitted 06/02/2012 for operative treatment of<principal problem not specified>. Patient has severe unremitting pain that affects sleep, daily activities, and work/hobbies. After pre-op clearance the patient was taken to the operating room on 06/02/2012 and underwent  Procedure(s): TOTAL HIP ARTHROPLASTY.    Patient was given perioperative antibiotics: Anti-infectives     Start     Dose/Rate Route Frequency Ordered Stop   06/01/12 1526   ceFAZolin (ANCEF) IVPB 2 g/50 mL premix        2 g 100 mL/hr over 30 Minutes Intravenous 60 min pre-op 06/01/12 1526 06/02/12 1310           Patient was given sequential compression devices, early ambulation, and chemoprophylaxis to prevent DVT.  Patient benefited maximally from hospital stay and there were no complications.    Recent vital signs: Patient Vitals for the past 24 hrs:  BP Temp Pulse Resp SpO2  06/04/12 0550 110/55 mmHg 98.3 F (36.8 C) 78  18  94 %  06/06/2012 2121 119/47 mmHg 99 F (37.2 C) 84  16  97 %  2012/06/06 1400 141/66 mmHg 98.1 F (36.7 C) 73  17  100 %     Recent laboratory studies:  Basename 06/04/12 0420 06/06/12 0555  WBC 8.1 5.7  HGB 10.2* 10.7*  HCT 29.9* 30.9*  PLT 218 211  NA -- 140  K -- 4.5  CL -- 104  CO2 -- 28  BUN -- 8  CREATININE -- 0.51  GLUCOSE -- 130*  INR -- --  CALCIUM -- 8.9      Discharge Medications:     Medication List     As of 06/04/2012  8:04 AM    STOP taking these medications         HYDROcodone-acetaminophen 5-325 MG per tablet   Commonly known as: NORCO/VICODIN      TAKE these medications         albuterol 108 (90 BASE) MCG/ACT inhaler   Commonly known as: PROVENTIL HFA;VENTOLIN HFA   Inhale 2 puffs into the lungs every 6 (six) hours as needed. For shortness of breath      aspirin 325 MG EC tablet   Take 1 tablet (325 mg total) by mouth 2 (two) times daily.      bisoprolol-hydrochlorothiazide 10-6.25 MG per tablet   Commonly known as: ZIAC   Take 1 tablet by mouth daily.      buPROPion 300 MG 24 hr tablet   Commonly known as: WELLBUTRIN XL   Take 300 mg by mouth daily.      cyclobenzaprine 10 MG tablet   Commonly known as: FLEXERIL   Take 10 mg by mouth 2 (two) times daily as needed.      enalapril 20 MG tablet   Commonly known as: VASOTEC   Take 20 mg by mouth daily.  oxyCODONE-acetaminophen 7.5-325 MG per tablet   Commonly known as: PERCOCET   Take 1-2 tablets by mouth every 4 (four) hours as needed for pain.      solifenacin 10 MG tablet   Commonly known as: VESICARE   Take 10 mg by mouth daily.      vitamin C 1000 MG tablet   Take 1,000 mg by mouth daily.      Vitamin D 2000 UNITS tablet   Take 2,000 Units by mouth daily.      WELCHOL 3.75 G Pack   Generic drug: Colesevelam HCl   Take 3.75 g by mouth daily.        Diagnostic Studies: Dg Chest 2 View  05/28/2012  *RADIOLOGY REPORT*  Clinical Data: Hypertension, diabetes, smoking history, for right hip replacement  CHEST - 2 VIEW  Comparison: Chest x-ray of 05/01/2011  Findings: The lungs remain clear but somewhat hyperaerated. Mediastinal contours are stable.  The heart is borderline enlarged. There are mild degenerative changes in the lower thoracic spine.  IMPRESSION: Stable chest x-ray with hyperaeration and borderline cardiomegaly. No active lung  disease.   Original Report Authenticated By: Dwyane Dee, M.D.    Dg Pelvis Portable  06/02/2012  *RADIOLOGY REPORT*  Clinical Data: Status post right total hip arthroplasty.  PORTABLE PELVIS  Comparison: Cross-table lateral view from the same day.  Findings: The patient is now status post right total hip arthroplasty.  The acetabular and femoral components are well seated.  The tip is located.  Gas and fluid in the joint are within normal limits.  Moderate degenerative changes are noted in the left hip.  IMPRESSION:  1.  Status post right total hip arthroplasty without radiographic evidence for complication. 2.  Moderate degenerative changes in the left hip.   Original Report Authenticated By: Marin Roberts, M.D.    Dg Hip Portable 1 View Right  06/02/2012  *RADIOLOGY REPORT*  Clinical Data: Postop hip arthroplasty.  PORTABLE RIGHT HIP - 1 VIEW  Comparison: None.  Findings: Single cross-table lateral view of a right hip arthroplasty is submitted.  Femoral stem is well seated. Subcutaneous air and fluid are noted.  IMPRESSION: Right total hip arthroplasty.  Femoral stem appears well seated.   Original Report Authenticated By: Leanna Battles, M.D.     Disposition: 01-Home or Self Care      Discharge Orders    Future Orders Please Complete By Expires   Increase activity slowly      Walker       May shower / Bathe      Driving Restrictions      Comments:   No driving for 2 weeks.   Change dressing (specify)      Comments:   Dressing change as needed.   Call MD for:  temperature >100.4      Call MD for:  severe uncontrolled pain      Call MD for:  redness, tenderness, or signs of infection (pain, swelling, redness, odor or green/yellow discharge around incision site)      Discharge instructions      Comments:   F/U with Dr. Turner Daniels as scheduled.         SignedHazle Nordmann. 06/04/2012, 8:04 AM

## 2012-06-04 NOTE — Progress Notes (Signed)
Pt discharged to home accompanied by friend. Discharge instructions and rx given and explained and patient stated understanding. IV removed. Pt left unit in stable condition via wheelchair.

## 2012-06-04 NOTE — Progress Notes (Signed)
Physical Therapy Treatment Patient Details Name: [name] MRN: [id number] DOB: 1951/04/12 Today's Date: 06/04/2012 Time: 0810-0857 PT Time Calculation (min): 47 min  PT Assessment / Plan / Recommendation Comments on Treatment Session  Reviewed car transfers with pt    Follow Up Recommendations  Home health PT;Supervision for mobility/OOB     Does the patient have the potential to tolerate intense rehabilitation     Barriers to Discharge        Equipment Recommendations  Tub/shower seat    Recommendations for Other Services OT consult  Frequency 7X/week   Plan Discharge plan remains appropriate;Frequency remains appropriate    Precautions / Restrictions Precautions Precautions: Posterior Hip Precaution Comments: Pt recalls 2/3 THP without cues Restrictions Weight Bearing Restrictions: Yes RLE Weight Bearing: Weight bearing as tolerated   Pertinent Vitals/Pain 3/10; premedicated,     Mobility  Bed Mobility Bed Mobility: Not assessed Supine to Sit: 5: Supervision;With rails;HOB elevated Sitting - Scoot to Edge of Bed: 5: Supervision Details for Bed Mobility Assistance: vc for technique to maintain hip precautions; assist to raise leg onto bed and instruction not to cross Lt foot under Rt to assist (as pt was attempting to do) Transfers Transfers: Sit to Stand;Stand to Sit Sit to Stand: 5: Supervision Stand to Sit: 5: Supervision Details for Transfer Assistance: wfl Ambulation/Gait Ambulation/Gait Assistance: 4: Min guard;5: Supervision Ambulation Distance (Feet): 150 Feet Assistive device: Rolling walker Ambulation/Gait Assistance Details: cues for posture, position from RW and ER on R Gait Pattern: Step-to pattern;Step-through pattern Stairs: Yes Stairs Assistance: 4: Min assist Stair Management Technique: One rail Right;Step to pattern;Forwards;With cane Number of Stairs: 3  (twice)    Exercises Total Joint Exercises Ankle Circles/Pumps:  AROM;Both;Supine;15 reps Quad Sets: AROM;Right;10 reps;Supine Gluteal Sets: AROM;Both;10 reps;Supine Heel Slides: AROM;AAROM;Right;10 reps;Supine Hip ABduction/ADduction: AROM;Right;10 reps;Supine   PT Diagnosis:    PT Problem List:   PT Treatment Interventions:     PT Goals Acute Rehab PT Goals PT Goal Formulation: With patient Time For Goal Achievement: 06/06/12 Potential to Achieve Goals: Good Pt will go Supine/Side to Sit: with HOB 0 degrees;with supervision PT Goal: Supine/Side to Sit - Progress: Progressing toward goal Pt will go Sit to Supine/Side: with supervision;with HOB 0 degrees PT Goal: Sit to Supine/Side - Progress: Progressing toward goal Pt will go Sit to Stand: with upper extremity assist;with supervision PT Goal: Sit to Stand - Progress: Met Pt will go Stand to Sit: with upper extremity assist;with supervision PT Goal: Stand to Sit - Progress: Met Pt will Ambulate: >150 feet;with supervision;with least restrictive assistive device PT Goal: Ambulate - Progress: Met Pt will Go Up / Down Stairs: 1-2 stairs;with min assist;with least restrictive assistive device PT Goal: Up/Down Stairs - Progress: Met Pt will Perform Home Exercise Program: with supervision, verbal cues required/provided PT Goal: Perform Home Exercise Program - Progress: Progressing toward goal  Visit Information  Last PT Received On: 06/04/12 Assistance Needed: +1    Subjective Data  Subjective: I'm ready, I'm going home today Patient Stated Goal: decr pain and return home in "next couple of days"   Cognition  Overall Cognitive Status: Appears within functional limits for tasks assessed/performed Arousal/Alertness: Awake/alert Orientation Level: Oriented X4 / Intact Behavior During Session: [facility] for tasks performed    Balance     End of Session PT - End of Session Equipment Utilized During Treatment: Gait belt Activity Tolerance: Patient tolerated treatment well Patient left: in  chair;with call bell/phone within reach Nurse Communication: Mobility status  GP     [name] 06/04/2012, 11:57 AM

## 2012-06-04 NOTE — Evaluation (Signed)
Occupational Therapy Evaluation Patient Details Name: [name] MRN: [id number] DOB: 1951-03-18 Today's Date: 06/04/2012 Time: 2841-3244 OT Time Calculation (min): 22 min  OT Assessment / Plan / Recommendation Clinical Impression  61 yo female s/p Rt THA that does not require acute OT but could benefit from [facility] at d/c with tub seat. OT to sign off    OT Assessment  Patient does not need any further OT services    Follow Up Recommendations  Home health OT    Barriers to Discharge      Equipment Recommendations  Tub/shower seat    Recommendations for Other Services    Frequency       Precautions / Restrictions Precautions Precautions: Posterior Hip Restrictions RLE Weight Bearing: Weight bearing as tolerated   Pertinent Vitals/Pain Minimal pain No medication needs at this time per pt    ADL  Lower Body Dressing: Modified independent Where Assessed - Lower Body Dressing: Unsupported sit to stand (AE required, pt plans pt purchase hip kit in gift shop) Toilet Transfer: Supervision/safety Toilet Transfer Method: Sit to Barista: Raised toilet seat with arms (or 3-in-1 over toilet) Tub/Shower Transfer: Minimal assistance Tub/Shower Transfer Method: Science writer: Counsellor Used: Gait belt;Rolling walker Transfers/Ambulation Related to ADLs: Pt ambulating at supervision level from room to ortho gym ADL Comments: pt educated on hip precautions with adls. Pt demonstrated and educated on AE for LB adls. pt plans to purchase AE. pt educated on tub transfer with tub shower doors. Pt could benefit from shower seat. Pt will have brother assistance for shower transfers. Pt educated on water temperature and hand placement for safety    OT Diagnosis:    OT Problem List:   OT Treatment Interventions:     OT Goals    Visit Information  Last OT Received On: 06/04/12 Assistance Needed: +1    Subjective  Data  Subjective: "I could just have them take down the doors"- pt response to tub transfer simulating glass sliding doors Patient Stated Goal: to return home today with brother assistance   Prior Functioning     Home Living Lives With: Spouse;Other (Comment) Available Help at Discharge: Family;Available 24 hours/day Type of Home: House Home Access: Stairs to enter [employer] of Steps: 2 Entrance Stairs-Rails: None Home Layout: Two level;Able to live on main level with bedroom/bathroom Bathroom Shower/Tub: Tub/shower unit;Door [employer] Toilet: Standard Bathroom Accessibility: Yes How Accessible: Accessible via walker Home Adaptive Equipment: Walker - rolling;Straight cane Prior Function Level of Independence: Independent with assistive device(s) Able to Take Stairs?: Yes Driving: Yes Vocation: Other (comment) Communication Communication: No difficulties Dominant Hand: Right         Vision/Perception     Cognition  Overall Cognitive Status: Appears within functional limits for tasks assessed/performed Arousal/Alertness: Awake/alert Orientation Level: Oriented X4 / Intact Behavior During Session: [facility] for tasks performed    Extremity/Trunk Assessment Right Upper Extremity Assessment RUE ROM/Strength/Tone: Within functional levels Left Upper Extremity Assessment LUE ROM/Strength/Tone: Within functional levels     Mobility Bed Mobility Bed Mobility: Not assessed Transfers Sit to Stand: 5: Supervision Stand to Sit: 5: Supervision Details for Transfer Assistance: wfl     Shoulder Instructions     Exercise     Balance     End of Session OT - End of Session Activity Tolerance: Patient tolerated treatment well Patient left: in chair;with call bell/phone within reach Nurse Communication: Mobility status;Precautions  GO     Sherri Carina  Solomon 06/04/2012, 9:56 AM Pager: [phone]

## 2012-06-04 NOTE — Progress Notes (Signed)
PATIENT ID: [name]  MRN: [id number]  DOB/AGE:  Apr 25, 1951 / 61 y.o.  2 Days Post-Op Procedure(s) (LRB): TOTAL HIP ARTHROPLASTY (Right)    PROGRESS NOTE Subjective: Patient is alert, oriented,no Nausea, no Vomiting, yes passing gas, no Bowel Movement. Taking PO well. Denies SOB, Chest or Calf Pain. Using Incentive Spirometer, PAS in place. Ambulating well with PT. Patient reports pain as moderate  .    Objective: Vital signs in last 24 hours: Filed Vitals:   06/03/12 0659 06/03/12 1400 06/03/12 2121 06/04/12 0550  BP: 145/62 141/66 119/47 110/55  Pulse: 60 73 84 78  Temp: 98.9 F (37.2 C) 98.1 F (36.7 C) 99 F (37.2 C) 98.3 F (36.8 C)  TempSrc:      Resp: 18 17 16 18   SpO2: 99% 100% 97% 94%      Intake/Output from previous day: I/O last 3 completed shifts: In: 2010 [P.O.:510; I.V.:1500] Out: -    Intake/Output this shift:     LABORATORY DATA:  Basename 06/04/12 0420 06/03/12 0555  WBC 8.1 5.7  HGB 10.2* 10.7*  HCT 29.9* 30.9*  PLT 218 211  NA -- 140  K -- 4.5  CL -- 104  CO2 -- 28  BUN -- 8  CREATININE -- 0.51  GLUCOSE -- 130*  GLUCAP -- --  INR -- --  CALCIUM -- 8.9    Examination: Neurologically intact ABD soft Neurovascular intact Sensation intact distally Intact pulses distally Dorsiflexion/Plantar flexion intact Incision: dressing C/D/I} XR AP&Lat of hip shows well placed\fixed THA  Assessment:   2 Days Post-Op Procedure(s) (LRB): TOTAL HIP ARTHROPLASTY (Right) ADDITIONAL DIAGNOSIS:  none  Plan: PT/OT WBAT, THA  posterior precautions  DVT Prophylaxis: SCDx72 hrs, ASA 325 mg BID x 2 weeks  DISCHARGE PLAN: Home today  DISCHARGE NEEDS: HHPT, HHRN, Walker and 3-in-1 comode seat

## 2012-11-24 ENCOUNTER — Other Ambulatory Visit: Payer: Self-pay | Admitting: Orthopedic Surgery

## 2012-12-05 NOTE — Pre-Procedure Instructions (Signed)
[name]  12/05/2012   Your procedure is scheduled on:  Monday December 15, 2012.  Report to [facility] at 10:45 AM.  Call this number if you have problems the morning of surgery: [phone]   Remember:   Do not eat food or drink liquids after midnight.   Take these medicines the morning of surgery with A SIP OF WATER: Albuterol inhaler if needed for shortness of breath, Bisoprolol (Ziac), Bupropion (Wellbutrin), Oxycodone (Percocet) if needed for pain, and Oxybutynin   Do not wear jewelry, make-up or nail polish.  Do not wear lotions, powders, or perfumes.   Do not shave 48 hours prior to surgery.   Do not bring valuables to the hospital.  Contacts, dentures or bridgework may not be worn into surgery.  Leave suitcase in the car. After surgery it may be brought to your room.  For patients admitted to the hospital, checkout time is 11:00 AM the day of discharge.   Patients discharged the day of surgery will not be allowed to drive home.  Name and phone number of your driver: Family/Friend  Special Instructions: Shower using CHG 2 nights before surgery and the night before surgery.  If you shower the day of surgery use CHG.  Use special wash - you have one bottle of CHG for all showers.  You should use approximately 1/3 of the bottle for each shower.   Please read over the following fact sheets that you were given: Pain Booklet, Coughing and Deep Breathing, Blood Transfusion Information, MRSA Information and Surgical Site Infection Prevention

## 2012-12-09 ENCOUNTER — Encounter (HOSPITAL_COMMUNITY)
Admission: RE | Admit: 2012-12-09 | Discharge: 2012-12-09 | Disposition: A | Discharge status: Home / Self Care | Payer: 59 | Source: Ambulatory Visit | Attending: Orthopedic Surgery | Admitting: Orthopedic Surgery

## 2012-12-09 ENCOUNTER — Encounter (HOSPITAL_COMMUNITY): Payer: Self-pay

## 2012-12-09 ENCOUNTER — Encounter (HOSPITAL_COMMUNITY): Payer: Self-pay | Admitting: Pharmacy Technician

## 2012-12-09 DIAGNOSIS — Z01812 Encounter for preprocedural laboratory examination: Secondary | ICD-10-CM | POA: Insufficient documentation

## 2012-12-09 DIAGNOSIS — Z01818 Encounter for other preprocedural examination: Secondary | ICD-10-CM | POA: Insufficient documentation

## 2012-12-09 HISTORY — DX: Nausea with vomiting, unspecified: R11.2

## 2012-12-09 HISTORY — DX: Urgency of urination: R39.15

## 2012-12-09 HISTORY — DX: Nausea with vomiting, unspecified: Z98.890

## 2012-12-09 HISTORY — DX: Other allergy status, other than to drugs and biological substances: Z91.09

## 2012-12-09 LAB — URINALYSIS, ROUTINE W REFLEX MICROSCOPIC
Bilirubin Urine: NEGATIVE
Ketones, ur: NEGATIVE mg/dL
Leukocytes, UA: NEGATIVE
Nitrite: NEGATIVE
Specific Gravity, Urine: 1.013 (ref 1.005–1.030)
Urobilinogen, UA: 1 mg/dL (ref 0.0–1.0)
pH: 6 (ref 5.0–8.0)

## 2012-12-09 LAB — CBC WITH DIFFERENTIAL/PLATELET
Eosinophils Absolute: 0.2 10*3/uL (ref 0.0–0.7)
Eosinophils Relative: 3 % (ref 0–5)
HCT: 37.9 % (ref 36.0–46.0)
Hemoglobin: 13.6 g/dL (ref 12.0–15.0)
Lymphocytes Relative: 31 % (ref 12–46)
Lymphs Abs: 2.3 10*3/uL (ref 0.7–4.0)
MCH: 27.3 pg (ref 26.0–34.0)
MCV: 76 fL — ABNORMAL LOW (ref 78.0–100.0)
Monocytes Relative: 6 % (ref 3–12)
RBC: 4.99 MIL/uL (ref 3.87–5.11)
WBC: 7.6 10*3/uL (ref 4.0–10.5)

## 2012-12-09 LAB — COMPREHENSIVE METABOLIC PANEL
AST: 15 U/L (ref 0–37)
Albumin: 3.8 g/dL (ref 3.5–5.2)
BUN: 19 mg/dL (ref 6–23)
Chloride: 98 mEq/L (ref 96–112)
Creatinine, Ser: 0.83 mg/dL (ref 0.50–1.10)
Potassium: 4.5 mEq/L (ref 3.5–5.1)
Total Protein: 7.5 g/dL (ref 6.0–8.3)

## 2012-12-09 LAB — TYPE AND SCREEN: Antibody Screen: NEGATIVE

## 2012-12-09 LAB — PROTIME-INR: Prothrombin Time: 11.9 seconds (ref 11.6–15.2)

## 2012-12-12 NOTE — H&P (Signed)
TOTAL KNEE ADMISSION H&P  Patient is being admitted for right total knee arthroplasty.  Subjective:  Chief Complaint:right knee pain.  HPI: [name], 62 y.o. female, has a history of pain and functional disability in the right knee due to arthritis and has failed non-surgical conservative treatments for greater than 12 weeks to includeNSAID's and/or analgesics, corticosteriod injections and use of assistive devices.  Onset of symptoms was gradual, starting 2 years ago with rapidlly worsening course since that time. The patient noted no past surgery on the right knee(s).  Patient currently rates pain in the right knee(s) at 8 out of 10 with activity. Patient has night pain and worsening of pain with activity and weight bearing.  Patient has evidence of subchondral cysts, periarticular osteophytes and joint space narrowing by imaging studies. There is no active infection.  There are no active problems to display for this patient.  Past Medical History  Diagnosis Date  . Hypertension   . Hyperlipidemia   . Anxiety   . Diabetes mellitus without complication     controlled by diet  . Arthritis     osteoarthritis  . PONV (postoperative nausea and vomiting)   . Environmental allergies   . Urgency of urination     Takes oxybutynin; under control at this time    Past Surgical History  Procedure Laterality Date  . Appendectomy      removed as child  . Partial hysterectomy    . Lumbar fusion  02/06/2012    L 4  L5  . Back surgery      lumbar fusion  . Dilation and curettage of uterus    . Total hip arthroplasty  06/02/2012    Procedure: TOTAL HIP ARTHROPLASTY;  Surgeon: Nestor Lewandowsky, MD;  Location: [facility] OR;  Service: Orthopedics;  Laterality: Right;  . Colonoscopy      No prescriptions prior to admission   No Known Allergies  History  Substance Use Topics  . Smoking status: Current Every Day Smoker -- 0.50 packs/day for 30 years  . Smokeless tobacco: Never Used  . Alcohol  Use: Yes     Comment: either a glass of wine a night or 2 beers    No family history on file.   Review of Systems  Constitutional: Negative.   HENT: Negative.   Eyes: Negative.   Respiratory: Negative.   Cardiovascular: Negative.   Gastrointestinal: Negative.   Genitourinary: Negative.   Musculoskeletal: Positive for back pain and joint pain.  Skin: Negative.   Neurological: Negative.   Endo/Heme/Allergies: Negative.   Psychiatric/Behavioral: Negative.     Objective:  Physical Exam  Constitutional: She is oriented to person, place, and time. She appears well-developed and well-nourished.  HENT:  Head: Normocephalic.  Eyes: Pupils are equal, round, and reactive to light.  Cardiovascular: Intact distal pulses.   Respiratory: Effort normal.  Musculoskeletal:       Right knee: She exhibits decreased range of motion and deformity. Tenderness found. Lateral joint line tenderness noted.  Neurological: She is alert and oriented to person, place, and time.    Vital signs in last 24 hours:    Labs:   Estimated body mass index is 28.99 kg/(m^2) as calculated from the following:   Height as of 05/28/12: 5\' 5"  (1.651 m).   Weight as of 05/28/12: 79.017 kg (174 lb 3.2 oz).   Imaging Review Plain radiographs demonstrate severe degenerative joint disease of the right knee(s). The overall alignment ismild valgus. The bone quality  appears to be adequate for age and reported activity level.  Assessment/Plan:  End stage arthritis, right knee   The patient history, physical examination, clinical judgment of the provider and imaging studies are consistent with end stage degenerative joint disease of the right knee(s) and total knee arthroplasty is deemed medically necessary. The treatment options including medical management, injection therapy arthroscopy and arthroplasty were discussed at length. The risks and benefits of total knee arthroplasty were presented and reviewed. The risks  due to aseptic loosening, infection, stiffness, patella tracking problems, thromboembolic complications and other imponderables were discussed. The patient acknowledged the explanation, agreed to proceed with the plan and consent was signed. Patient is being admitted for inpatient treatment for surgery, pain control, PT, OT, prophylactic antibiotics, VTE prophylaxis, progressive ambulation and ADL's and discharge planning. The patient is planning to be discharged home with home health services

## 2012-12-12 NOTE — Progress Notes (Signed)
Pt. Called concerning time change for surgery,to arrive at 0900.  Pt.voices understanding.

## 2012-12-14 MED ORDER — CEFAZOLIN SODIUM-DEXTROSE 2-3 GM-% IV SOLR
2.0000 g | INTRAVENOUS | Status: AC
  Administered 2012-12-15: 2 g via INTRAVENOUS
  Filled 2012-12-14: qty 50

## 2012-12-15 ENCOUNTER — Inpatient Hospital Stay (HOSPITAL_COMMUNITY)
Admission: RE | Admit: 2012-12-15 | Discharge: 2012-12-16 | DRG: 470 | Disposition: A | Discharge status: Home / Self Care | Payer: 59 | Source: Ambulatory Visit | Attending: Orthopedic Surgery | Admitting: Orthopedic Surgery

## 2012-12-15 ENCOUNTER — Encounter (HOSPITAL_COMMUNITY): Payer: Self-pay | Admitting: *Deleted

## 2012-12-15 ENCOUNTER — Encounter (HOSPITAL_COMMUNITY): Payer: Self-pay | Admitting: Anesthesiology

## 2012-12-15 ENCOUNTER — Encounter (HOSPITAL_COMMUNITY)
Admission: RE | Disposition: A | Discharge status: Home / Self Care | Payer: Self-pay | Source: Ambulatory Visit | Attending: Orthopedic Surgery

## 2012-12-15 ENCOUNTER — Inpatient Hospital Stay (HOSPITAL_COMMUNITY): Payer: 59 | Admitting: Anesthesiology

## 2012-12-15 DIAGNOSIS — F411 Generalized anxiety disorder: Secondary | ICD-10-CM | POA: Diagnosis present

## 2012-12-15 DIAGNOSIS — M1710 Unilateral primary osteoarthritis, unspecified knee: Principal | ICD-10-CM | POA: Diagnosis present

## 2012-12-15 DIAGNOSIS — I10 Essential (primary) hypertension: Secondary | ICD-10-CM | POA: Diagnosis present

## 2012-12-15 DIAGNOSIS — E785 Hyperlipidemia, unspecified: Secondary | ICD-10-CM | POA: Diagnosis present

## 2012-12-15 DIAGNOSIS — IMO0002 Reserved for concepts with insufficient information to code with codable children: Secondary | ICD-10-CM

## 2012-12-15 DIAGNOSIS — Z7982 Long term (current) use of aspirin: Secondary | ICD-10-CM

## 2012-12-15 DIAGNOSIS — E119 Type 2 diabetes mellitus without complications: Secondary | ICD-10-CM | POA: Diagnosis present

## 2012-12-15 DIAGNOSIS — Z96649 Presence of unspecified artificial hip joint: Secondary | ICD-10-CM

## 2012-12-15 DIAGNOSIS — F17200 Nicotine dependence, unspecified, uncomplicated: Secondary | ICD-10-CM | POA: Diagnosis present

## 2012-12-15 DIAGNOSIS — M1711 Unilateral primary osteoarthritis, right knee: Secondary | ICD-10-CM

## 2012-12-15 HISTORY — PX: TOTAL KNEE ARTHROPLASTY: SHX125

## 2012-12-15 LAB — GLUCOSE, CAPILLARY: Glucose-Capillary: 98 mg/dL (ref 70–99)

## 2012-12-15 SURGERY — ARTHROPLASTY, KNEE, TOTAL
Anesthesia: General | Site: Knee | Laterality: Right | Wound class: Clean

## 2012-12-15 MED ORDER — ENALAPRIL MALEATE 20 MG PO TABS
20.0000 mg | ORAL_TABLET | Freq: Every day | ORAL | Status: DC
Start: 2012-12-15 — End: 2012-12-16
  Administered 2012-12-15 – 2012-12-16 (×2): 20 mg via ORAL
  Filled 2012-12-15 (×2): qty 1

## 2012-12-15 MED ORDER — LIDOCAINE HCL (CARDIAC) 20 MG/ML IV SOLN
INTRAVENOUS | Status: DC | PRN
  Administered 2012-12-15: 70 mg via INTRAVENOUS

## 2012-12-15 MED ORDER — HYDROMORPHONE HCL PF 1 MG/ML IJ SOLN
1.0000 mg | INTRAMUSCULAR | Status: DC | PRN
  Administered 2012-12-15: 1 mg via INTRAVENOUS
  Filled 2012-12-15: qty 1

## 2012-12-15 MED ORDER — CEFUROXIME SODIUM 1.5 G IJ SOLR
INTRAMUSCULAR | Status: DC | PRN
  Administered 2012-12-15: 1.5 g

## 2012-12-15 MED ORDER — ALBUTEROL SULFATE HFA 108 (90 BASE) MCG/ACT IN AERS
2.0000 | INHALATION_SPRAY | Freq: Four times a day (QID) | RESPIRATORY_TRACT | Status: DC | PRN
  Filled 2012-12-15: qty 6.7

## 2012-12-15 MED ORDER — DIPHENHYDRAMINE HCL 12.5 MG/5ML PO ELIX: 12.5000 mg | ORAL_SOLUTION | ORAL | Status: DC | PRN

## 2012-12-15 MED ORDER — BUPIVACAINE HCL (PF) 0.5 % IJ SOLN
INTRAMUSCULAR | Status: AC
  Filled 2012-12-15: qty 30

## 2012-12-15 MED ORDER — COLESEVELAM HCL 3.75 G PO PACK: 3.7500 g | PACK | Freq: Every day | ORAL | Status: DC

## 2012-12-15 MED ORDER — OXYCODONE-ACETAMINOPHEN 7.5-325 MG PO TABS: 1.0000 | ORAL_TABLET | ORAL | Status: DC | PRN

## 2012-12-15 MED ORDER — METOCLOPRAMIDE HCL 5 MG/ML IJ SOLN: 5.0000 mg | Freq: Three times a day (TID) | INTRAMUSCULAR | Status: DC | PRN

## 2012-12-15 MED ORDER — HYDROMORPHONE HCL PF 1 MG/ML IJ SOLN
0.2500 mg | INTRAMUSCULAR | Status: DC | PRN
  Administered 2012-12-15: 0.5 mg via INTRAVENOUS

## 2012-12-15 MED ORDER — VITAMIN C 500 MG PO TABS: 1000.0000 mg | ORAL_TABLET | Freq: Every day | ORAL | Status: DC

## 2012-12-15 MED ORDER — OXYCODONE HCL 5 MG PO TABS
5.0000 mg | ORAL_TABLET | ORAL | Status: DC | PRN
  Administered 2012-12-15 – 2012-12-16 (×2): 10 mg via ORAL
  Filled 2012-12-15 (×2): qty 2

## 2012-12-15 MED ORDER — MEPERIDINE HCL 25 MG/ML IJ SOLN: 6.2500 mg | INTRAMUSCULAR | Status: DC | PRN

## 2012-12-15 MED ORDER — OXYCODONE-ACETAMINOPHEN 5-325 MG PO TABS
1.5000 | ORAL_TABLET | ORAL | Status: DC | PRN
  Administered 2012-12-16 (×2): 2 via ORAL
  Filled 2012-12-15 (×2): qty 2

## 2012-12-15 MED ORDER — ONDANSETRON HCL 4 MG/2ML IJ SOLN
INTRAMUSCULAR | Status: DC | PRN
  Administered 2012-12-15: 4 mg via INTRAVENOUS

## 2012-12-15 MED ORDER — METOCLOPRAMIDE HCL 5 MG PO TABS
5.0000 mg | ORAL_TABLET | Freq: Three times a day (TID) | ORAL | Status: DC | PRN
  Filled 2012-12-15: qty 2

## 2012-12-15 MED ORDER — PHENOL 1.4 % MT LIQD: 1.0000 | OROMUCOSAL | Status: DC | PRN

## 2012-12-15 MED ORDER — PROPOFOL 10 MG/ML IV BOLUS
INTRAVENOUS | Status: DC | PRN
  Administered 2012-12-15: 150 mg via INTRAVENOUS

## 2012-12-15 MED ORDER — VITAMIN C 500 MG PO TABS
1000.0000 mg | ORAL_TABLET | Freq: Every day | ORAL | Status: DC
  Administered 2012-12-15: 1000 mg via ORAL
  Filled 2012-12-15 (×2): qty 2

## 2012-12-15 MED ORDER — NEOSTIGMINE METHYLSULFATE 1 MG/ML IJ SOLN
INTRAMUSCULAR | Status: DC | PRN
  Administered 2012-12-15: 3 mg via INTRAVENOUS

## 2012-12-15 MED ORDER — CEFUROXIME SODIUM 1.5 G IJ SOLR
INTRAMUSCULAR | Status: AC
  Filled 2012-12-15: qty 1.5

## 2012-12-15 MED ORDER — MIDAZOLAM HCL 5 MG/5ML IJ SOLN
INTRAMUSCULAR | Status: DC | PRN
  Administered 2012-12-15: 2 mg via INTRAVENOUS

## 2012-12-15 MED ORDER — SODIUM CHLORIDE 0.9 % IR SOLN
Status: DC | PRN
  Administered 2012-12-15: 3000 mL

## 2012-12-15 MED ORDER — OXYCODONE HCL 5 MG PO TABS: 5.0000 mg | ORAL_TABLET | Freq: Once | ORAL | Status: DC | PRN

## 2012-12-15 MED ORDER — METHOCARBAMOL 500 MG PO TABS: 500.0000 mg | ORAL_TABLET | Freq: Four times a day (QID) | ORAL | Status: DC | PRN

## 2012-12-15 MED ORDER — ACETAMINOPHEN 325 MG PO TABS
650.0000 mg | ORAL_TABLET | Freq: Four times a day (QID) | ORAL | Status: DC | PRN
  Administered 2012-12-15: 650 mg via ORAL
  Filled 2012-12-15: qty 2

## 2012-12-15 MED ORDER — BISACODYL 5 MG PO TBEC: 5.0000 mg | DELAYED_RELEASE_TABLET | Freq: Every day | ORAL | Status: DC | PRN

## 2012-12-15 MED ORDER — KCL IN DEXTROSE-NACL 20-5-0.45 MEQ/L-%-% IV SOLN
INTRAVENOUS | Status: DC
  Administered 2012-12-16: 02:00:00 via INTRAVENOUS
  Filled 2012-12-15 (×4): qty 1000

## 2012-12-15 MED ORDER — BUPROPION HCL ER (XL) 300 MG PO TB24
300.0000 mg | ORAL_TABLET | Freq: Every day | ORAL | Status: DC
  Administered 2012-12-15 – 2012-12-16 (×2): 300 mg via ORAL
  Filled 2012-12-15 (×3): qty 1

## 2012-12-15 MED ORDER — SODIUM CHLORIDE 0.9 % IJ SOLN
INTRAMUSCULAR | Status: DC | PRN
  Administered 2012-12-15: 12:00:00

## 2012-12-15 MED ORDER — VITAMIN D3 25 MCG (1000 UNIT) PO TABS
2000.0000 [IU] | ORAL_TABLET | Freq: Every day | ORAL | Status: DC
  Administered 2012-12-15 – 2012-12-16 (×2): 2000 [IU] via ORAL
  Filled 2012-12-15 (×2): qty 2

## 2012-12-15 MED ORDER — LACTATED RINGERS IV SOLN
INTRAVENOUS | Status: DC
  Administered 2012-12-15: 10:00:00 via INTRAVENOUS

## 2012-12-15 MED ORDER — CYCLOBENZAPRINE HCL 10 MG PO TABS: 10.0000 mg | ORAL_TABLET | Freq: Two times a day (BID) | ORAL | Status: DC | PRN

## 2012-12-15 MED ORDER — BISOPROLOL-HYDROCHLOROTHIAZIDE 10-6.25 MG PO TABS
1.0000 | ORAL_TABLET | Freq: Every day | ORAL | Status: DC
  Administered 2012-12-15 – 2012-12-16 (×2): 1 via ORAL
  Filled 2012-12-15 (×2): qty 1

## 2012-12-15 MED ORDER — HYDROMORPHONE HCL PF 1 MG/ML IJ SOLN
INTRAMUSCULAR | Status: DC | PRN
  Administered 2012-12-15 (×2): 0.5 mg via INTRAVENOUS

## 2012-12-15 MED ORDER — CHLORHEXIDINE GLUCONATE 4 % EX LIQD: 60.0000 mL | Freq: Once | CUTANEOUS | Status: DC

## 2012-12-15 MED ORDER — ACETAMINOPHEN 650 MG RE SUPP: 650.0000 mg | Freq: Four times a day (QID) | RECTAL | Status: DC | PRN

## 2012-12-15 MED ORDER — MAGNESIUM HYDROXIDE 400 MG/5ML PO SUSP: 30.0000 mL | Freq: Every day | ORAL | Status: DC | PRN

## 2012-12-15 MED ORDER — ONDANSETRON HCL 4 MG PO TABS: 4.0000 mg | ORAL_TABLET | Freq: Four times a day (QID) | ORAL | Status: DC | PRN

## 2012-12-15 MED ORDER — ALUM & MAG HYDROXIDE-SIMETH 200-200-20 MG/5ML PO SUSP: 30.0000 mL | ORAL | Status: DC | PRN

## 2012-12-15 MED ORDER — CELECOXIB 200 MG PO CAPS
200.0000 mg | ORAL_CAPSULE | Freq: Two times a day (BID) | ORAL | Status: DC
  Administered 2012-12-15 – 2012-12-16 (×2): 200 mg via ORAL
  Filled 2012-12-15 (×3): qty 1

## 2012-12-15 MED ORDER — ASPIRIN EC 325 MG PO TBEC
325.0000 mg | DELAYED_RELEASE_TABLET | Freq: Two times a day (BID) | ORAL | Status: DC
  Administered 2012-12-15 – 2012-12-16 (×2): 325 mg via ORAL
  Filled 2012-12-15 (×3): qty 1

## 2012-12-15 MED ORDER — OXYBUTYNIN CHLORIDE 5 MG PO TABS
2.5000 mg | ORAL_TABLET | Freq: Every day | ORAL | Status: DC
  Administered 2012-12-15: 2.5 mg via ORAL
  Administered 2012-12-15: 0.5 mg via ORAL
  Administered 2012-12-16: 2.5 mg via ORAL
  Filled 2012-12-15 (×2): qty 0.5

## 2012-12-15 MED ORDER — LACTATED RINGERS IV SOLN
INTRAVENOUS | Status: DC | PRN
  Administered 2012-12-15 (×2): via INTRAVENOUS

## 2012-12-15 MED ORDER — COLESEVELAM HCL 3.75 G PO PACK
3.7500 g | PACK | Freq: Every day | ORAL | Status: DC
  Administered 2012-12-16: 3.8 g via ORAL
  Filled 2012-12-15 (×2): qty 1

## 2012-12-15 MED ORDER — PHENYLEPHRINE HCL 10 MG/ML IJ SOLN
INTRAMUSCULAR | Status: DC | PRN
  Administered 2012-12-15 (×2): 80 ug via INTRAVENOUS

## 2012-12-15 MED ORDER — FENTANYL CITRATE 0.05 MG/ML IJ SOLN
INTRAMUSCULAR | Status: DC | PRN
  Administered 2012-12-15: 50 ug via INTRAVENOUS
  Administered 2012-12-15: 150 ug via INTRAVENOUS
  Administered 2012-12-15: 50 ug via INTRAVENOUS

## 2012-12-15 MED ORDER — GLYCOPYRROLATE 0.2 MG/ML IJ SOLN
INTRAMUSCULAR | Status: DC | PRN
  Administered 2012-12-15: 0.4 mg via INTRAVENOUS

## 2012-12-15 MED ORDER — DEXTROSE-NACL 5-0.45 % IV SOLN: INTRAVENOUS | Status: DC

## 2012-12-15 MED ORDER — METHOCARBAMOL 100 MG/ML IJ SOLN
500.0000 mg | Freq: Four times a day (QID) | INTRAVENOUS | Status: DC | PRN
  Filled 2012-12-15: qty 5

## 2012-12-15 MED ORDER — DEXAMETHASONE SODIUM PHOSPHATE 4 MG/ML IJ SOLN
INTRAMUSCULAR | Status: DC | PRN
  Administered 2012-12-15: 8 mg via INTRAVENOUS

## 2012-12-15 MED ORDER — BUPIVACAINE LIPOSOME 1.3 % IJ SUSP
20.0000 mL | INTRAMUSCULAR | Status: DC
  Filled 2012-12-15: qty 20

## 2012-12-15 MED ORDER — ROCURONIUM BROMIDE 100 MG/10ML IV SOLN
INTRAVENOUS | Status: DC | PRN
  Administered 2012-12-15: 40 mg via INTRAVENOUS

## 2012-12-15 MED ORDER — OXYCODONE HCL 5 MG/5ML PO SOLN: 5.0000 mg | Freq: Once | ORAL | Status: DC | PRN

## 2012-12-15 MED ORDER — HYDROMORPHONE HCL PF 1 MG/ML IJ SOLN
INTRAMUSCULAR | Status: AC
  Filled 2012-12-15: qty 1

## 2012-12-15 MED ORDER — ONDANSETRON HCL 4 MG/2ML IJ SOLN: 4.0000 mg | Freq: Four times a day (QID) | INTRAMUSCULAR | Status: DC | PRN

## 2012-12-15 MED ORDER — PROMETHAZINE HCL 25 MG/ML IJ SOLN: 6.2500 mg | INTRAMUSCULAR | Status: DC | PRN

## 2012-12-15 MED ORDER — MENTHOL 3 MG MT LOZG: 1.0000 | LOZENGE | OROMUCOSAL | Status: DC | PRN

## 2012-12-15 MED ORDER — VITAMIN D 50 MCG (2000 UT) PO TABS: 2000.0000 [IU] | ORAL_TABLET | Freq: Every day | ORAL | Status: DC

## 2012-12-15 MED ORDER — FLEET ENEMA 7-19 GM/118ML RE ENEM: 1.0000 | ENEMA | Freq: Once | RECTAL | Status: AC | PRN

## 2012-12-15 SURGICAL SUPPLY — 60 items
BANDAGE ELASTIC 6 VELCRO ST LF (GAUZE/BANDAGES/DRESSINGS) ×2 IMPLANT
BANDAGE ESMARK 6X9 LF (GAUZE/BANDAGES/DRESSINGS) ×1 IMPLANT
BLADE SAG 18X100X1.27 (BLADE) ×2 IMPLANT
BLADE SAW SGTL 13X75X1.27 (BLADE) ×2 IMPLANT
BLADE SURG ROTATE 9660 (MISCELLANEOUS) IMPLANT
BNDG CMPR 9X6 STRL LF SNTH (GAUZE/BANDAGES/DRESSINGS) ×1
BNDG CMPR MED 10X6 ELC LF (GAUZE/BANDAGES/DRESSINGS) ×1
BNDG ELASTIC 6X10 VLCR STRL LF (GAUZE/BANDAGES/DRESSINGS) ×2 IMPLANT
BNDG ESMARK 6X9 LF (GAUZE/BANDAGES/DRESSINGS) ×2
BOWL SMART MIX CTS (DISPOSABLE) ×2 IMPLANT
CEMENT HV SMART SET (Cement) ×4 IMPLANT
CLOTH BEACON ORANGE TIMEOUT ST (SAFETY) ×2 IMPLANT
COVER SURGICAL LIGHT HANDLE (MISCELLANEOUS) ×2 IMPLANT
CUFF TOURNIQUET SINGLE 34IN LL (TOURNIQUET CUFF) ×2 IMPLANT
CUFF TOURNIQUET SINGLE 44IN (TOURNIQUET CUFF) IMPLANT
DRAPE EXTREMITY T 121X128X90 (DRAPE) ×2 IMPLANT
DRAPE U-SHAPE 47X51 STRL (DRAPES) ×2 IMPLANT
DRSG PAD ABDOMINAL 8X10 ST (GAUZE/BANDAGES/DRESSINGS) ×2 IMPLANT
DURAPREP 26ML APPLICATOR (WOUND CARE) ×2 IMPLANT
ELECT REM PT RETURN 9FT ADLT (ELECTROSURGICAL) ×2
ELECTRODE REM PT RTRN 9FT ADLT (ELECTROSURGICAL) ×1 IMPLANT
EVACUATOR 1/8 PVC DRAIN (DRAIN) ×2 IMPLANT
GAUZE XEROFORM 1X8 LF (GAUZE/BANDAGES/DRESSINGS) ×2 IMPLANT
GLOVE BIO SURGEON STRL SZ 6.5 (GLOVE) ×2 IMPLANT
GLOVE BIO SURGEON STRL SZ7 (GLOVE) ×4 IMPLANT
GLOVE BIO SURGEON STRL SZ7.5 (GLOVE) ×2 IMPLANT
GLOVE BIOGEL PI IND STRL 6.5 (GLOVE) ×1 IMPLANT
GLOVE BIOGEL PI IND STRL 7.0 (GLOVE) ×1 IMPLANT
GLOVE BIOGEL PI IND STRL 8 (GLOVE) ×1 IMPLANT
GLOVE BIOGEL PI INDICATOR 6.5 (GLOVE) ×1
GLOVE BIOGEL PI INDICATOR 7.0 (GLOVE) ×1
GLOVE BIOGEL PI INDICATOR 8 (GLOVE) ×1
GLOVE SURG SS PI 7.0 STRL IVOR (GLOVE) ×6 IMPLANT
GOWN PREVENTION PLUS XLARGE (GOWN DISPOSABLE) ×2 IMPLANT
GOWN STRL NON-REIN LRG LVL3 (GOWN DISPOSABLE) ×4 IMPLANT
HANDPIECE INTERPULSE COAX TIP (DISPOSABLE) ×2
HOOD PEEL AWAY FACE SHEILD DIS (HOOD) ×6 IMPLANT
KIT BASIN OR (CUSTOM PROCEDURE TRAY) ×2 IMPLANT
KIT ROOM TURNOVER OR (KITS) ×2 IMPLANT
MANIFOLD NEPTUNE II (INSTRUMENTS) ×2 IMPLANT
NS IRRIG 1000ML POUR BTL (IV SOLUTION) ×2 IMPLANT
PACK TOTAL JOINT (CUSTOM PROCEDURE TRAY) ×2 IMPLANT
PAD ARMBOARD 7.5X6 YLW CONV (MISCELLANEOUS) ×4 IMPLANT
PAD CAST 4YDX4 CTTN HI CHSV (CAST SUPPLIES) ×1 IMPLANT
PADDING CAST COTTON 4X4 STRL (CAST SUPPLIES) ×2
PADDING CAST COTTON 6X4 STRL (CAST SUPPLIES) ×2 IMPLANT
SET HNDPC FAN SPRY TIP SCT (DISPOSABLE) ×1 IMPLANT
SPONGE GAUZE 4X4 12PLY (GAUZE/BANDAGES/DRESSINGS) ×2 IMPLANT
STAPLER VISISTAT 35W (STAPLE) ×2 IMPLANT
SUCTION FRAZIER TIP 10 FR DISP (SUCTIONS) ×2 IMPLANT
SUT VIC AB 0 CTX 36 (SUTURE) ×2
SUT VIC AB 0 CTX36XBRD ANTBCTR (SUTURE) ×1 IMPLANT
SUT VIC AB 1 CTX 36 (SUTURE) ×1
SUT VIC AB 1 CTX36XBRD ANBCTR (SUTURE) ×1 IMPLANT
SUT VIC AB 2-0 CT1 27 (SUTURE) ×2
SUT VIC AB 2-0 CT1 TAPERPNT 27 (SUTURE) ×1 IMPLANT
TOWEL OR 17X24 6PK STRL BLUE (TOWEL DISPOSABLE) ×2 IMPLANT
TOWEL OR 17X26 10 PK STRL BLUE (TOWEL DISPOSABLE) ×2 IMPLANT
TRAY FOLEY CATH 14FR (SET/KITS/TRAYS/PACK) ×2 IMPLANT
WATER STERILE IRR 1000ML POUR (IV SOLUTION) ×4 IMPLANT

## 2012-12-15 NOTE — Interval H&P Note (Signed)
History and Physical Interval Note:  12/15/2012 9:44 AM  [name]  has presented today for surgery, with the diagnosis of RIGHT KNEE OSTEOARTHRITIS  The various methods of treatment have been discussed with the patient and family. After consideration of risks, benefits and other options for treatment, the patient has consented to  Procedure(s): TOTAL KNEE ARTHROPLASTY (Right) as a surgical intervention .  The patient's history has been reviewed, patient examined, no change in status, stable for surgery.  I have reviewed the patient's chart and labs.  Questions were answered to the patient's satisfaction.     Nestor Lewandowsky

## 2012-12-15 NOTE — Progress Notes (Signed)
Orthopedic Tech Progress Note Patient Details:  [name] 1951-04-09 [id number] Applied CPM to RLE.  Left Footsie Roll with pt.'s nurse. CPM Right Knee CPM Right Knee: On Right Knee Flexion (Degrees): 60 Right Knee Extension (Degrees): 0   [name] 12/15/2012, 2:15 PM

## 2012-12-15 NOTE — Anesthesia Procedure Notes (Signed)
Procedure Name: Intubation Date/Time: 12/15/2012 11:08 AM Performed by: Orlinda Blalock, Forney Kleinpeter L Pre-anesthesia Checklist: Patient identified, Emergency Drugs available, Suction available, Patient being monitored and Timeout performed Patient Re-evaluated:Patient Re-evaluated prior to inductionOxygen Delivery Method: Circle system utilized Preoxygenation: Pre-oxygenation with 100% oxygen Intubation Type: IV induction Ventilation: Mask ventilation without difficulty Laryngoscope Size: Mac and 3 Grade View: Grade III Tube type: Oral Tube size: 7.0 mm Number of attempts: 2 Airway Equipment and Method: Stylet Placement Confirmation: ETT inserted through vocal cords under direct vision,  positive ETCO2 and breath sounds checked- equal and bilateral Secured at: 20 cm Tube secured with: Tape Dental Injury: Teeth and Oropharynx as per pre-operative assessment  Difficulty Due To: Difficulty was unanticipated Future Recommendations: Recommend- induction with short-acting agent, and alternative techniques readily available Comments: Pt anterior with large epiglottis grade 3 view on second DL

## 2012-12-15 NOTE — Anesthesia Preprocedure Evaluation (Signed)
Anesthesia Evaluation  Patient identified by MRN, date of birth, ID band Patient awake    Reviewed: Allergy & Precautions, H&P , NPO status , Patient's Chart, lab work & pertinent test results  Airway Mallampati: II  Neck ROM: Full    Dental   Pulmonary  breath sounds clear to auscultation        Cardiovascular hypertension, Rhythm:Regular Rate:Normal     Neuro/Psych    GI/Hepatic   Endo/Other  diabetes  Renal/GU      Musculoskeletal  (+) Arthritis -,   Abdominal   Peds  Hematology   Anesthesia Other Findings   Reproductive/Obstetrics                           Anesthesia Physical Anesthesia Plan  ASA: II  Anesthesia Plan: General   Post-op Pain Management:    Induction:   Airway Management Planned: Oral ETT  Additional Equipment:   Intra-op Plan:   Post-operative Plan: Extubation in OR  Informed Consent: I have reviewed the patients History and Physical, chart, labs and discussed the procedure including the risks, benefits and alternatives for the proposed anesthesia with the patient or authorized representative who has indicated his/her understanding and acceptance.   Dental advisory given  Plan Discussed with: CRNA and Surgeon  Anesthesia Plan Comments:         Anesthesia Quick Evaluation

## 2012-12-15 NOTE — Op Note (Signed)
PATIENT ID:      [name]  MRN:     [id number] DOB/AGE:    [birth year] / 62 y.o.       OPERATIVE REPORT    DATE OF PROCEDURE:  12/15/2012       PREOPERATIVE DIAGNOSIS:   RIGHT KNEE OSTEOARTHRITIS, Valgus     Estimated body mass index is 28.99 kg/(m^2) as calculated from the following:   Height as of 12/09/12: 5\' 5"  (1.651 m).   Weight as of 05/28/12: 79.017 kg (174 lb 3.2 oz).                                                        POSTOPERATIVE DIAGNOSIS:   Right knee Osteoarthritis, Valgus                                                                    PROCEDURE:  Procedure(s): TOTAL KNEE ARTHROPLASTY Using Depuy Sigma RP implants #4Nr Femur, #3Tibia, 10mm sigma RP bearing, 38 Patella     SURGEON: Donald Memoli J    ASSISTANT:   Shirl Harris PA-C   (Present and scrubbed throughout the case, critical for assistance with exposure, retraction, instrumentation, and closure.)         ANESTHESIA: GET , Exparel, Marcaine  DRAINS: foley, 2 medium hemovac in knee   TOURNIQUET TIME:   COMPLICATIONS:  None     SPECIMENS: None   INDICATIONS FOR PROCEDURE: The patient has  RIGHT KNEE OSTEOARTHRITIS, varus deformities, XR shows bone on bone arthritis. Patient has failed all conservative measures including anti-inflammatory medicines, narcotics, attempts at  exercise and weight loss, cortisone injections and viscosupplementation.  Risks and benefits of surgery have been discussed, questions answered.   DESCRIPTION OF PROCEDURE: The patient identified by armband, received  IV antibiotics, in the holding area at [facility]. Patient taken to the operating room, appropriate anesthetic  monitors were attached, and general endotracheal anesthesia induced with  the patient in supine position, Foley catheter was inserted. Tourniquet  applied high to the operative thigh. Lateral post and foot positioner  applied to the table, the lower extremity was then prepped and draped  in  usual sterile fashion from the ankle to the tourniquet. Time-out procedure was performed. The limb was wrapped with an Esmarch bandage and the tourniquet inflated to 350 mmHg. We began the operation by making the anterior midline incision starting at handbreadth above the patella going over the patella 1 cm medial to and  4 cm distal to the tibial tubercle. Small bleeders in the skin and the  subcutaneous tissue identified and cauterized. Transverse retinaculum was incised and reflected medially and a medial parapatellar arthrotomy was accomplished. the patella was everted and theprepatellar fat pad resected. The superficial medial collateral  ligament was then elevated from anterior to posterior along the proximal  flare of the tibia and anterior half of the menisci resected. The knee was hyperflexed exposing bone on bone arthritis. Peripheral and notch osteophytes as well as the cruciate ligaments were then resected. We continued to  work our way around posteriorly  along the proximal tibia, and externally  rotated the tibia subluxing it out from underneath the femur. A McHale  retractor was placed through the notch and a lateral Hohmann retractor  placed, and we then drilled through the proximal tibia in line with the  axis of the tibia followed by an intramedullary guide rod and 2-degree  posterior slope cutting guide. The tibial cutting guide was pinned into place  allowing resection of 8 mm of bone medially and about 8 mm of bone  laterally because of her varus deformity. Satisfied with the tibial resection, we then  entered the distal femur 2 mm anterior to the PCL origin with the  intramedullary guide rod and applied the distal femoral cutting guide  set at 11mm, with 5 degrees of valgus. This was pinned along the  epicondylar axis. At this point, the distal femoral cut was accomplished without difficulty. We then sized for a #4N femoral component and pinned the guide in 0 degrees of external  rotation.The chamfer cutting guide was pinned into place. The anterior, posterior, and chamfer cuts were accomplished without difficulty followed by  the Sigma RP box cutting guide and the box cut. We also removed posterior osteophytes from the posterior femoral condyles. At this  time, the knee was brought into full extension. We checked our  extension and flexion gaps and found them symmetric at 10mm.  The patella thickness measured at 25 mm. We set the cutting guide at 15 and removed the posterior 10 mm  of the patella, sized for a 38 button and drilled the lollipop. The knee  was then once again hyperflexed exposing the proximal tibia. We sized for a #3 tibial base plate, applied the smokestack and the conical reamer followed by the the Delta fin keel punch. We then hammered into place the Sigma RP trial femoral component, inserted a 10-mm trial bearing, trial patellar button, and took the knee through range of motion from 0-130 degrees. No thumb pressure was required for patellar  tracking. At this point, all trial components were removed, a double batch of DePuy HV cement with 1500 mg of Zinacef was mixed and applied to all bony metallic mating surfaces except for the posterior condyles of the femur itself. In order, we  hammered into place the tibial tray and removed excess cement, the femoral component and removed excess cement, a 10-mm Sigma RP bearing  was inserted, and the knee brought to full extension with compression.  The patellar button was clamped into place, and excess cement  removed. While the cement cured the wound was irrigated out with normal saline solution pulse lavage, and medium Hemovac drains were placed from an anterolateral  approach. Ligament stability and patellar tracking were checked and found to be excellent. The parapatellar arthrotomy was closed with  running #1 Vicryl suture. The subcutaneous tissue with 0 and 2-0 undyed  Vicryl suture, and the skin with skin  staples. A dressing of Xeroform,  4 x 4, dressing sponges, Webril, and Ace wrap applied. The patient  awakened, extubated, and taken to recovery room without difficulty.   Nestor Lewandowsky 12/15/2012, 12:36 PM

## 2012-12-15 NOTE — Transfer of Care (Signed)
Immediate Anesthesia Transfer of Care Note  Patient: [name]  Procedure(s) Performed: Procedure(s): TOTAL KNEE ARTHROPLASTY (Right)  Patient Location: PACU  Anesthesia Type:General  Level of Consciousness: awake, alert , oriented and patient cooperative  Airway & Oxygen Therapy: Patient Spontanous Breathing and Patient connected to nasal cannula oxygen  Post-op Assessment: Report given to PACU RN, Post -op Vital signs reviewed and stable and Patient moving all extremities  Post vital signs: Reviewed and stable  Complications: No apparent anesthesia complications

## 2012-12-15 NOTE — Anesthesia Postprocedure Evaluation (Signed)
  Anesthesia Post-op Note  Patient: [name]  Procedure(s) Performed: Procedure(s): TOTAL KNEE ARTHROPLASTY (Right)  Patient Location: PACU  Anesthesia Type:General  Level of Consciousness: awake, alert  and oriented  Airway and Oxygen Therapy: Patient Spontanous Breathing and Patient connected to nasal cannula oxygen  Post-op Pain: mild  Post-op Assessment: Post-op Vital signs reviewed, Patient's Cardiovascular Status Stable, Respiratory Function Stable, Patent Airway and No signs of Nausea or vomiting  Post-op Vital Signs: Reviewed and stable  Complications: No apparent anesthesia complications

## 2012-12-15 NOTE — Evaluation (Signed)
Physical Therapy Evaluation Patient Details Name: [name] MRN: [id number] DOB: 10/15/1950 Today's Date: 12/15/2012 Time: 9562-1308 PT Time Calculation (min): 28 min  PT Assessment / Plan / Recommendation Clinical Impression  Patient is a 62 yo female s/p Rt TKA.  Will benefit from acute PT to maximize independence prior to return home.  Recommend HHPT at discharge.    PT Assessment  Patient needs continued PT services    Follow Up Recommendations  Home health PT;Supervision/Assistance - 24 hour    Does the patient have the potential to tolerate intense rehabilitation      Barriers to Discharge None      Equipment Recommendations  None recommended by PT    Recommendations for Other Services     Frequency 7X/week    Precautions / Restrictions Precautions Precautions: Knee Precaution Booklet Issued: Yes (comment) Precaution Comments: Reviewed precautions with patient Restrictions Weight Bearing Restrictions: Yes RLE Weight Bearing: Weight bearing as tolerated   Pertinent Vitals/Pain       Mobility  Bed Mobility Bed Mobility: Supine to Sit;Sitting - Scoot to Edge of Bed Supine to Sit: 4: Min assist Sitting - Scoot to Edge of Bed: 4: Min guard Details for Bed Mobility Assistance: Verbal cues for technique. Transfers Transfers: Sit to Stand;Stand to [employer] Transfers Sit to Stand: 4: Min assist;With upper extremity assist;From bed Stand to Sit: 4: Min assist;With upper extremity assist;With armrests;To chair/3-in-1 Stand Pivot Transfers: 4: Min assist Details for Transfer Assistance: Verbal cues for hand placement and technique. Able to take a few steps to pivot to chair.  Ambulation/Gait Ambulation/Gait Assistance: Not tested (comment)    Exercises Total Joint Exercises Ankle Circles/Pumps: AROM;Both;10 reps;Seated   PT Diagnosis: Difficulty walking;Acute pain  PT Problem List: Decreased strength;Decreased range of motion;Decreased activity  tolerance;Decreased mobility;Decreased knowledge of use of DME;Decreased knowledge of precautions;Pain PT Treatment Interventions: DME instruction;Gait training;Stair training;Functional mobility training;Therapeutic exercise;Patient/family education   PT Goals Acute Rehab PT Goals PT Goal Formulation: With patient Time For Goal Achievement: 12/22/12 Potential to Achieve Goals: Good Pt will go Supine/Side to Sit: Independently;with HOB 0 degrees PT Goal: Supine/Side to Sit - Progress: Goal set today Pt will go Sit to Supine/Side: Independently;with HOB 0 degrees PT Goal: Sit to Supine/Side - Progress: Goal set today Pt will go Sit to Stand: with supervision;with upper extremity assist PT Goal: Sit to Stand - Progress: Goal set today Pt will Ambulate: 51 - 150 feet;with supervision;with rolling walker PT Goal: Ambulate - Progress: Goal set today Pt will Go Up / Down Stairs: 1-2 stairs;with supervision;with least restrictive assistive device PT Goal: Up/Down Stairs - Progress: Goal set today Pt will Perform Home Exercise Program: with supervision, verbal cues required/provided PT Goal: Perform Home Exercise Program - Progress: Goal set today  Visit Information  Last PT Received On: 12/15/12 Assistance Needed: +1    Subjective Data  Subjective: "I want to get up and move around" Patient Stated Goal: To go home tomorrow   Prior Functioning  Home Living Lives With: Spouse;Other (Comment) (Brother) Available Help at Discharge: Family;Available 24 hours/day Type of Home: House Home Access: Stairs to enter [employer] of Steps: 2 Entrance Stairs-Rails: None Home Layout: Two level;Able to live on main level with bedroom/bathroom Bathroom Shower/Tub: Tub/shower unit;Curtain Bathroom Toilet: Standard Bathroom Accessibility: Yes How Accessible: Accessible via walker Home Adaptive Equipment: Walker - rolling;Shower chair with back Prior Function Level of Independence:  Independent Able to Take Stairs?: Yes Driving: Yes Vocation: Retired Musician: No difficulties  Cognition  Cognition Arousal/Alertness: Awake/alert Behavior During Therapy: WFL for tasks assessed/performed Overall Cognitive Status: Within Functional Limits for tasks assessed    Extremity/Trunk Assessment Right Upper Extremity Assessment RUE ROM/Strength/Tone: WFL for tasks assessed RUE Sensation: WFL - Light Touch Left Upper Extremity Assessment LUE ROM/Strength/Tone: WFL for tasks assessed LUE Sensation: WFL - Light Touch Right Lower Extremity Assessment RLE ROM/Strength/Tone: Deficits;Unable to fully assess;Due to pain RLE ROM/Strength/Tone Deficits: Able to move LE against gravity Left Lower Extremity Assessment LLE ROM/Strength/Tone: WFL for tasks assessed LLE Sensation: WFL - Light Touch Trunk Assessment Trunk Assessment: Normal   Balance Balance Balance Assessed: Yes Static Sitting Balance Static Sitting - Balance Support: No upper extremity supported;Feet supported Static Sitting - Level of Assistance: 7: Independent Static Sitting - Comment/# of Minutes: 8  End of Session PT - End of Session Equipment Utilized During Treatment: Gait belt Activity Tolerance: Patient tolerated treatment well Patient left: in chair;with call bell/phone within reach;with family/visitor present Nurse Communication: Mobility status CPM Right Knee CPM Right Knee: Off  GP     Vena Austria 12/15/2012, 5:44 PM  Durenda Hurt. Renaldo Fiddler, [facility] Acute Rehab Services Pager [phone]

## 2012-12-16 ENCOUNTER — Encounter (HOSPITAL_COMMUNITY): Payer: Self-pay | Admitting: Orthopedic Surgery

## 2012-12-16 LAB — BASIC METABOLIC PANEL
Calcium: 9.2 mg/dL (ref 8.4–10.5)
Creatinine, Ser: 0.65 mg/dL (ref 0.50–1.10)
GFR calc Af Amer: >90 mL/min (ref >90)
GFR calc non Af Amer: >90 mL/min (ref >90)

## 2012-12-16 LAB — CBC
MCH: 26.3 pg (ref 26.0–34.0)
MCV: 76.1 fL — ABNORMAL LOW (ref 78.0–100.0)
Platelets: 215 10*3/uL (ref 150–400)
RDW: 14 % (ref 11.5–15.5)

## 2012-12-16 NOTE — Progress Notes (Signed)
Patient ID: [name], female   DOB: [birth year], 62 y.o.   MRN: [id number] PATIENT ID: [name]  MRN: [id number]  DOB/AGE:  Dec 02, 1950 / 62 y.o.  1 Day Post-Op Procedure(s) (LRB): TOTAL KNEE ARTHROPLASTY (Right)    PROGRESS NOTE Subjective: Patient is alert, oriented, no Nausea, no Vomiting, yes passing gas, no Bowel Movement. Taking PO well, had vomiting x1 yesterday right after surgery but feels good this morning.. Denies SOB, Chest or Calf Pain. Using Incentive Spirometer, PAS in place. Ambulate a few steps with PT yesterday afternoon on date of surgery., CPM 0-50. Patient reports pain as 3 on 0-10 scale  .    Objective: Vital signs in last 24 hours: Filed Vitals:   12/15/12 1709 12/15/12 1900 12/15/12 2203 12/16/12 0200  BP: 165/66 166/70 143/83 130/66  Pulse: 66  80 77  Temp: 98.5 F (36.9 C)  97.9 F (36.6 C) 98.8 F (37.1 C)  TempSrc:      Resp: 14  18 16   SpO2: 97%  97% 94%      Intake/Output from previous day: I/O last 3 completed shifts: In: 1850 [I.V.:1850] Out: 625 [Urine:550; Drains:75]   Intake/Output this shift: Total I/O In: -  Out: 500 [Urine:250; Drains:250]   LABORATORY DATA:  Recent Labs  12/15/12 0920 12/16/12 0514  WBC  --  6.8  HGB  --  9.8*  HCT  --  28.4*  PLT  --  215  NA  --  136  K  --  4.0  CL  --  99  CO2  --  28  BUN  --  12  CREATININE  --  0.65  GLUCOSE  --  147*  GLUCAP 98  --   CALCIUM  --  9.2    Examination: Neurologically intact ABD soft Neurovascular intact Sensation intact distally Intact pulses distally Dorsiflexion/Plantar flexion intact Incision: no drainage No cellulitis present Compartment soft} Blood and plasma separated in drain indicating minimal recent drainage, drain pulled without difficulty.  Assessment:   1 Day Post-Op Procedure(s) (LRB): TOTAL KNEE ARTHROPLASTY (Right) ADDITIONAL DIAGNOSIS:  Hypertension  Plan: PT/OT WBAT, CPM 5/hrs day until ROM 0-90 degrees, then D/C CPM DVT  Prophylaxis:  SCDx72hrs, ASA 325 mg BID x 2 weeks DISCHARGE PLAN: Home, patient would like to try to go home today if she passes physical therapy and is aware of the goals. Prescriptions are on the chart. DISCHARGE NEEDS: HHPT, HHRN, CPM, Walker and 3-in-1 comode seat     Almina Schul J 12/16/2012, 6:31 AM

## 2012-12-16 NOTE — Evaluation (Signed)
Occupational Therapy Evaluation Patient Details Name: [name] MRN: [id number] DOB: Mar 22, 1951 Today's Date: 12/16/2012 Time: 2130-8657 OT Time Calculation (min): 11 min  OT Assessment / Plan / Recommendation Clinical Impression    Patient is a 62 yo female s/p Rt TKA. Practiced tub transfer and pt at [facility] assist for LB dressing. Feel pt is safe to d/c home with 24/7 assist from family.      OT Assessment  Patient does not need any further OT services    Follow Up Recommendations  No OT follow up;Supervision/Assistance - 24 hour    Barriers to Discharge      Equipment Recommendations  None recommended by OT    Recommendations for Other Services    Frequency       Precautions / Restrictions Precautions Precautions: Knee Precaution Booklet Issued: Yes (comment) Precaution Comments: Reviewed precautions with patient Restrictions Weight Bearing Restrictions: Yes RLE Weight Bearing: Weight bearing as tolerated   Pertinent Vitals/Pain 4/10 knee pain in footsie roll. Repositioned.     ADL  Eating/Feeding: Independent Where Assessed - Eating/Feeding: Chair Grooming: Set up Where Assessed - Grooming: Unsupported sitting Upper Body Bathing: Set up Where Assessed - Upper Body Bathing: Unsupported sitting Lower Body Bathing: Min guard Where Assessed - Lower Body Bathing: Supported sit to stand Upper Body Dressing: Set up Where Assessed - Upper Body Dressing: Unsupported sitting Lower Body Dressing: Min guard Where Assessed - Lower Body Dressing: Supported sit to Pharmacist, hospital: Buyer, retail Method: Sit to Barista: Other (comment) (from recliner chair) Tub/Shower Transfer: Performed;Min guard Tub/Shower Transfer Method: Science writer: Information systems manager with back Equipment Used: Gait belt;Rolling walker Transfers/Ambulation Related to ADLs: [place] for ambulation. Supervision for  transfers ADL Comments: Pt practiced tub transfer with shower chair at [place]ingu[place] assist. Pt able to don/doff sock while sitting in chair. Pt at minguard assist for LB dressing with sit to stand transfer.     OT Diagnosis:    OT Problem List:   OT Treatment Interventions:     OT Goals    Visit Information  Last OT Received On: 12/16/12 Assistance Needed: +1    Subjective Data      Prior Functioning     Home Living Lives With: Spouse;Other (Comment) (brother) Available Help at Discharge: Family;Available 24 hours/day Type of Home: House Home Access: Stairs to enter [employer] of Steps: 2 Entrance Stairs-Rails: None Home Layout: Two level;Able to live on main level with bedroom/bathroom Bathroom Shower/Tub: Tub/shower unit;Curtain Bathroom Toilet: Standard Bathroom Accessibility: Yes How Accessible: Accessible via walker Home Adaptive Equipment: Walker - rolling;Shower chair with back Prior Function Level of Independence: Independent Able to Take Stairs?: Yes Driving: Yes Vocation: Retired Musician: No difficulties         Vision/Perception     Copywriter, advertising Arousal/Alertness: Awake/alert Behavior During Therapy: WFL for tasks assessed/performed Overall Cognitive Status: Within Functional Limits for tasks assessed    Extremity/Trunk Assessment Right Upper Extremity Assessment RUE ROM/Strength/Tone: WFL for tasks assessed Left Upper Extremity Assessment LUE ROM/Strength/Tone: WFL for tasks assessed     Mobility Bed Mobility Bed Mobility: Not assessed Transfers Transfers: Sit to Stand;Stand to Sit Sit to Stand: 5: Supervision;From chair/3-in-1 Stand to Sit: 5: Supervision;To chair/3-in-1 Details for Transfer Assistance: supervision for safety        Balance     End of Session OT - End of Session Equipment Utilized During Treatment: Gait belt Activity Tolerance: Patient tolerated treatment well Patient left:  in  chair;with call bell/phone within reach  [employer] OTR/L [phone] 12/16/2012, 1:14 PM

## 2012-12-16 NOTE — Progress Notes (Signed)
Physical Therapy Treatment Patient Details Name: CHAYE MISCH MRN: [id number] DOB: [birth year] Today's Date: 12/16/2012 Time: 8657-8469 PT Time Calculation (min): 31 min  PT Assessment / Plan / Recommendation Comments on Treatment Session  Pt progressing well and able to increase ambulation distance.  Pt performed stair negotiation and hangout given.  Pt safe to d/c home from PT standpoint and continue to follow up with HHPT.    Follow Up Recommendations  Home health PT;Supervision/Assistance - 24 hour     Does the patient have the potential to tolerate intense rehabilitation     Barriers to Discharge        Equipment Recommendations  None recommended by PT    Recommendations for Other Services    Frequency 7X/week   Plan Discharge plan remains appropriate;Frequency remains appropriate    Precautions / Restrictions Precautions Precautions: Knee Precaution Booklet Issued: Yes (comment) Precaution Comments: Reviewed precautions with patient Restrictions Weight Bearing Restrictions: Yes RLE Weight Bearing: Weight bearing as tolerated   Pertinent Vitals/Pain 3/10 right knee pain    Mobility  Bed Mobility Bed Mobility: Not assessed Supine to Sit: 5: Supervision;HOB elevated Details for Bed Mobility Assistance: supervision for safety Transfers Transfers: Sit to Stand;Stand to Sit Sit to Stand: 5: Supervision;From bed Stand to Sit: 5: Supervision;To chair/3-in-1 Details for Transfer Assistance: supervision for safety with cues for hand placement Ambulation/Gait Ambulation/Gait Assistance: 4: Min guard Ambulation Distance (Feet): 150 Feet Assistive device: Rolling walker Ambulation/Gait Assistance Details: Cues for step sequence and encourage step through gait with upright posture. Gait Pattern: Step-to pattern;Decreased step length - right;Shuffle;Antalgic Gait velocity:   Stairs: Yes Stairs Assistance: 4: Min assist Stairs Assistance Details (indicate cue type and  reason): (A) to manage RW with cues for RW placement Stair Management Technique: Backwards;With walker Number of Stairs: 3    Exercises Total Joint Exercises Ankle Circles/Pumps: AROM;Both;10 reps;Seated Quad Sets: AAROM;Strengthening;Right;10 reps Heel Slides: AAROM;Strengthening;Right;10 reps;Seated   PT Diagnosis:    PT Problem List:   PT Treatment Interventions:     PT Goals Acute Rehab PT Goals PT Goal Formulation: With patient Time For Goal Achievement: 12/22/12 Potential to Achieve Goals: Good Pt will go Supine/Side to Sit: Independently;with HOB 0 degrees PT Goal: Supine/Side to Sit - Progress: Progressing toward goal Pt will go Sit to Supine/Side: Independently;with HOB 0 degrees PT Goal: Sit to Supine/Side - Progress: Progressing toward goal Pt will go Sit to Stand: with supervision;with upper extremity assist PT Goal: Sit to Stand - Progress: Met Pt will Ambulate: 51 - 150 feet;with supervision;with rolling walker PT Goal: Ambulate - Progress: Progressing toward goal Pt will Go Up / Down Stairs: 1-2 stairs;with supervision;with least restrictive assistive device PT Goal: Up/Down Stairs - Progress: Progressing toward goal Pt will Perform Home Exercise Program: with supervision, verbal cues required/provided PT Goal: Perform Home Exercise Program - Progress: Progressing toward goal  Visit Information  Last PT Received On: 12/16/12 Assistance Needed: +1    Subjective Data  Subjective: "I'm planning on going home today." Patient Stated Goal: To go home   Cognition  Cognition Arousal/Alertness: Awake/alert Behavior During Therapy: WFL for tasks assessed/performed Overall Cognitive Status: Within Functional Limits for tasks assessed    Balance     End of Session PT - End of Session Equipment Utilized During Treatment: Gait belt Activity Tolerance: Patient tolerated treatment well Patient left: in chair;with call bell/phone within reach;with family/visitor  present Nurse Communication: Mobility status   GP     Tashonda Pinkus 12/16/2012, 1:07 PM Toniann Fail  [place], [place] DPT [phone]

## 2012-12-16 NOTE — Care Management Note (Signed)
CARE MANAGEMENT NOTE 12/16/2012  Patient:  [name], [name]   Account Number:  [id number]  Date Initiated:  12/16/2012  Documentation initiated by:  Vance Peper  Subjective/Objective Assessment:   62 yr old female s/p right total knee arthroplasty.     Action/Plan:   CM spoke with patient concerning home health and DME needs at discharge. Patient preoperatively setup with [facility], no changes. Has rolling walke, CPM to be delivered to patient's home.   Anticipated DC Date:  12/17/2012   Anticipated DC Plan:  HOME W HOME HEALTH SERVICES      DC Planning Services  CM consult      [facility] Choice  HOME HEALTH  DURABLE MEDICAL EQUIPMENT   Choice offered to / List presented to:  C-1 Patient   DME arranged  CPM      DME agency  [employer]     HH arranged  HH-2 PT      HH agency  [facility]   Status of service:  Completed, signed off Medicare Important Message given?   (If response is "NO", the following Medicare IM given date fields will be blank) Date Medicare IM given:   Date Additional Medicare IM given:    Discharge Disposition:  HOME W HOME HEALTH SERVICES  Per UR Regulation:    If discussed at Long Length of Stay Meetings, dates discussed:    Comments:

## 2012-12-17 NOTE — Discharge Summary (Signed)
Patient ID: [name] MRN: [id number] DOB/AGE: 11-02-1950 62 y.o.  Admit date: 12/15/2012 Discharge date: 12/16/12  Admission Diagnoses:  Principal Problem:   Osteoarthritis of right knee   Discharge Diagnoses:  Same  Past Medical History  Diagnosis Date  . Hypertension   . Hyperlipidemia   . Anxiety   . Diabetes mellitus without complication     controlled by diet  . Arthritis     osteoarthritis  . PONV (postoperative nausea and vomiting)   . Environmental allergies   . Urgency of urination     Takes oxybutynin; under control at this time    Surgeries: Procedure(s): TOTAL KNEE ARTHROPLASTY on 12/15/2012   Consultants:    Discharged Condition: Improved  Hospital Course: [name] is an 62 y.o. female who was admitted 12/15/2012 for operative treatment ofOsteoarthritis of right knee. Patient has severe unremitting pain that affects sleep, daily activities, and work/hobbies. After pre-op clearance the patient was taken to the operating room on 12/15/2012 and underwent  Procedure(s): TOTAL KNEE ARTHROPLASTY.    Patient was given perioperative antibiotics: Anti-infectives   Start     Dose/Rate Route Frequency Ordered Stop   12/15/12 1152  cefUROXime (ZINACEF) injection  Status:  Discontinued       As needed 12/15/12 1152 12/15/12 1315   12/15/12 0600  ceFAZolin (ANCEF) IVPB 2 g/50 mL premix     2 g 100 mL/hr over 30 Minutes Intravenous On call to O.R. 12/14/12 1345 12/15/12 1115       Patient was given sequential compression devices, early ambulation, and chemoprophylaxis to prevent DVT.  Patient benefited maximally from hospital stay and there were no complications.    Recent vital signs: Patient Vitals for the past 24 hrs:  BP  12/16/12 1117 130/66 mmHg     Recent laboratory studies:  Recent Labs  12/16/12 0514  WBC 6.8  HGB 9.8*  HCT 28.4*  PLT 215  NA 136  K 4.0  CL 99  CO2 28  BUN 12  CREATININE 0.65  GLUCOSE 147*  CALCIUM 9.2      Discharge Medications:     Medication List    TAKE these medications       albuterol 108 (90 BASE) MCG/ACT inhaler  Commonly known as:  PROVENTIL HFA;VENTOLIN HFA  Inhale 2 puffs into the lungs every 6 (six) hours as needed. For shortness of breath     aspirin 325 MG EC tablet  Take 1 tablet (325 mg total) by mouth 2 (two) times daily.     bisoprolol-hydrochlorothiazide 10-6.25 MG per tablet  Commonly known as:  ZIAC  Take 1 tablet by mouth daily.     buPROPion 300 MG 24 hr tablet  Commonly known as:  WELLBUTRIN XL  Take 300 mg by mouth daily.     cyclobenzaprine 10 MG tablet  Commonly known as:  FLEXERIL  Take 10 mg by mouth 2 (two) times daily as needed.     enalapril 20 MG tablet  Commonly known as:  VASOTEC  Take 20 mg by mouth daily.     oxybutynin 5 MG tablet  Commonly known as:  DITROPAN  Take 2.5 mg by mouth daily.     oxyCODONE-acetaminophen 7.5-325 MG per tablet  Commonly known as:  PERCOCET  Take 1-2 tablets by mouth every 4 (four) hours as needed for pain.     vitamin C 1000 MG tablet  Take 1,000 mg by mouth daily.     Vitamin D 2000 UNITS  tablet  Take 2,000 Units by mouth daily.     WELCHOL 3.75 G Pack  Generic drug:  Colesevelam HCl  Take 3.75 g by mouth daily.        Diagnostic Studies: No results found.  Disposition: 01-Home or Self Care      Discharge Orders   Future Orders Complete By Expires     Call MD for:  redness, tenderness, or signs of infection (pain, swelling, redness, odor or green/yellow discharge around incision site)  As directed     Call MD for:  severe uncontrolled pain  As directed     Call MD for:  temperature >100.4  As directed     Change dressing (specify)  As directed     Comments:      Dressing change as needed.    Discharge instructions  As directed     Comments:      Follow up with Dr. Turner Daniels as scheduled (post-op day #14)    Driving Restrictions  As directed     Comments:      No driving for 2  weeks.    Increase activity slowly  As directed     May shower / Bathe  As directed     Walker   As directed           Signed: Jodine Muchmore M. 12/17/2012, 9:13 AM

## 2013-03-15 IMAGING — RF DG LUMBAR SPINE 2-3V
1 series · 2 of 2 positions shown · non-contrast
Comparison: MRI lumbar spine 12/05/2010.

CLINICAL DATA: Back pain

DG C-ARM 1-60 MIN,LUMBAR SPINE - 2-3 VIEW

[Series 1: run · 2 of 2 slices shown]
[im 1/2]
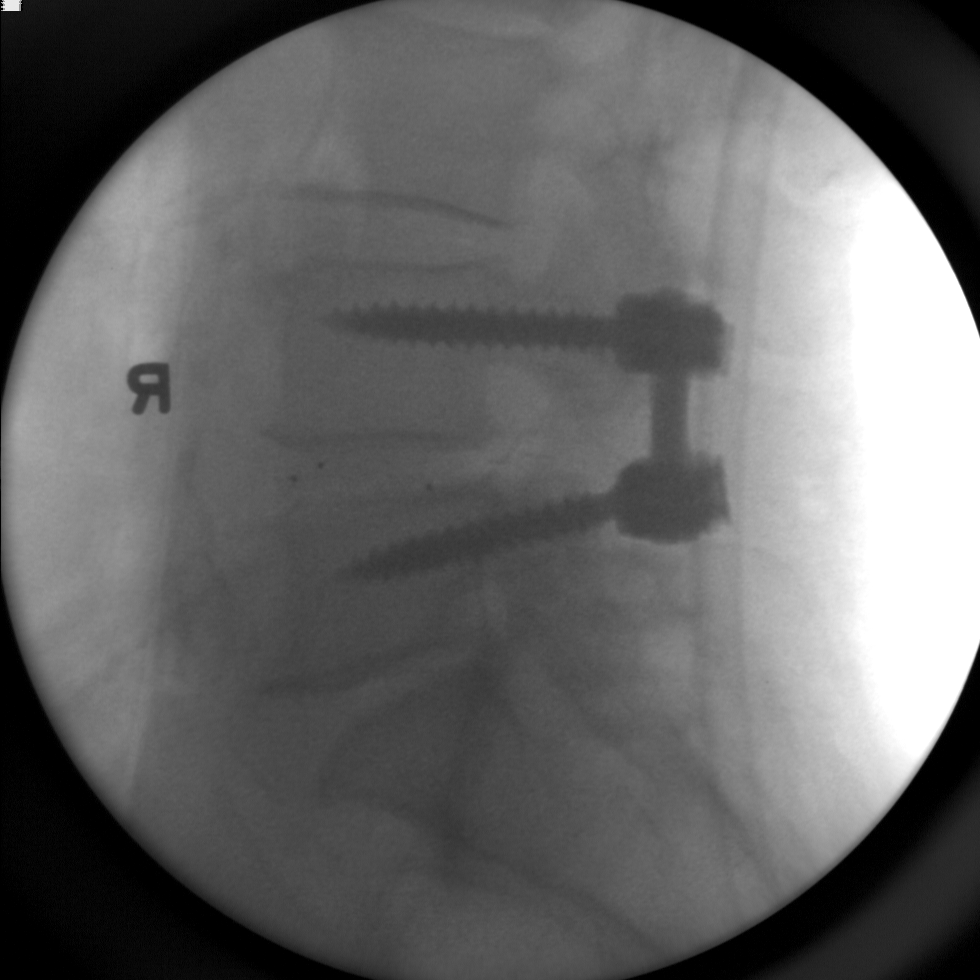
[im 2/2]
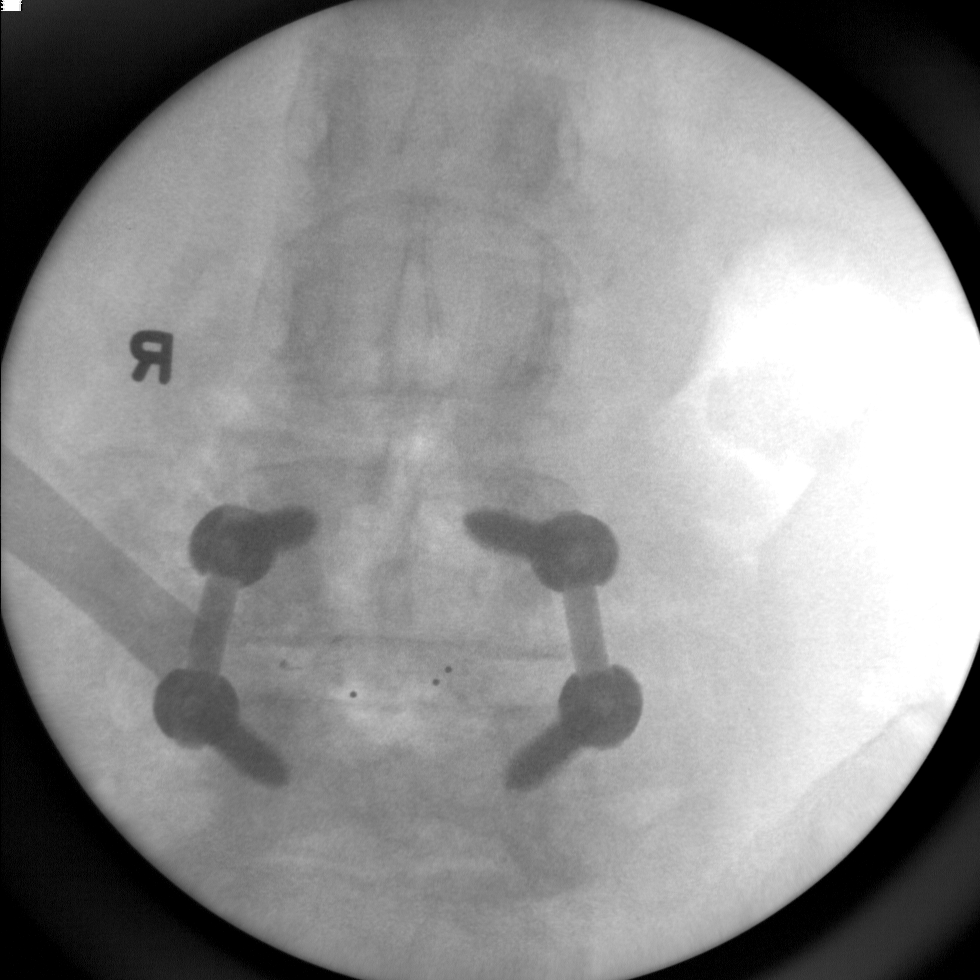

[2 of 2 positions shown; findings below may reference images not displayed]

FINDINGS: Status post posterolateral and interbody L4-5 fusion.  No
adverse features.
IMPRESSION: As above.

## 2013-03-15 IMAGING — CR DG LUMBAR SPINE 1V
1 series · 1 of 1 positions shown · non-contrast
Comparison: MRI 12/05/2010

CLINICAL DATA: L4-5 fusion

LUMBAR SPINE - 1 VIEW

[lateral]
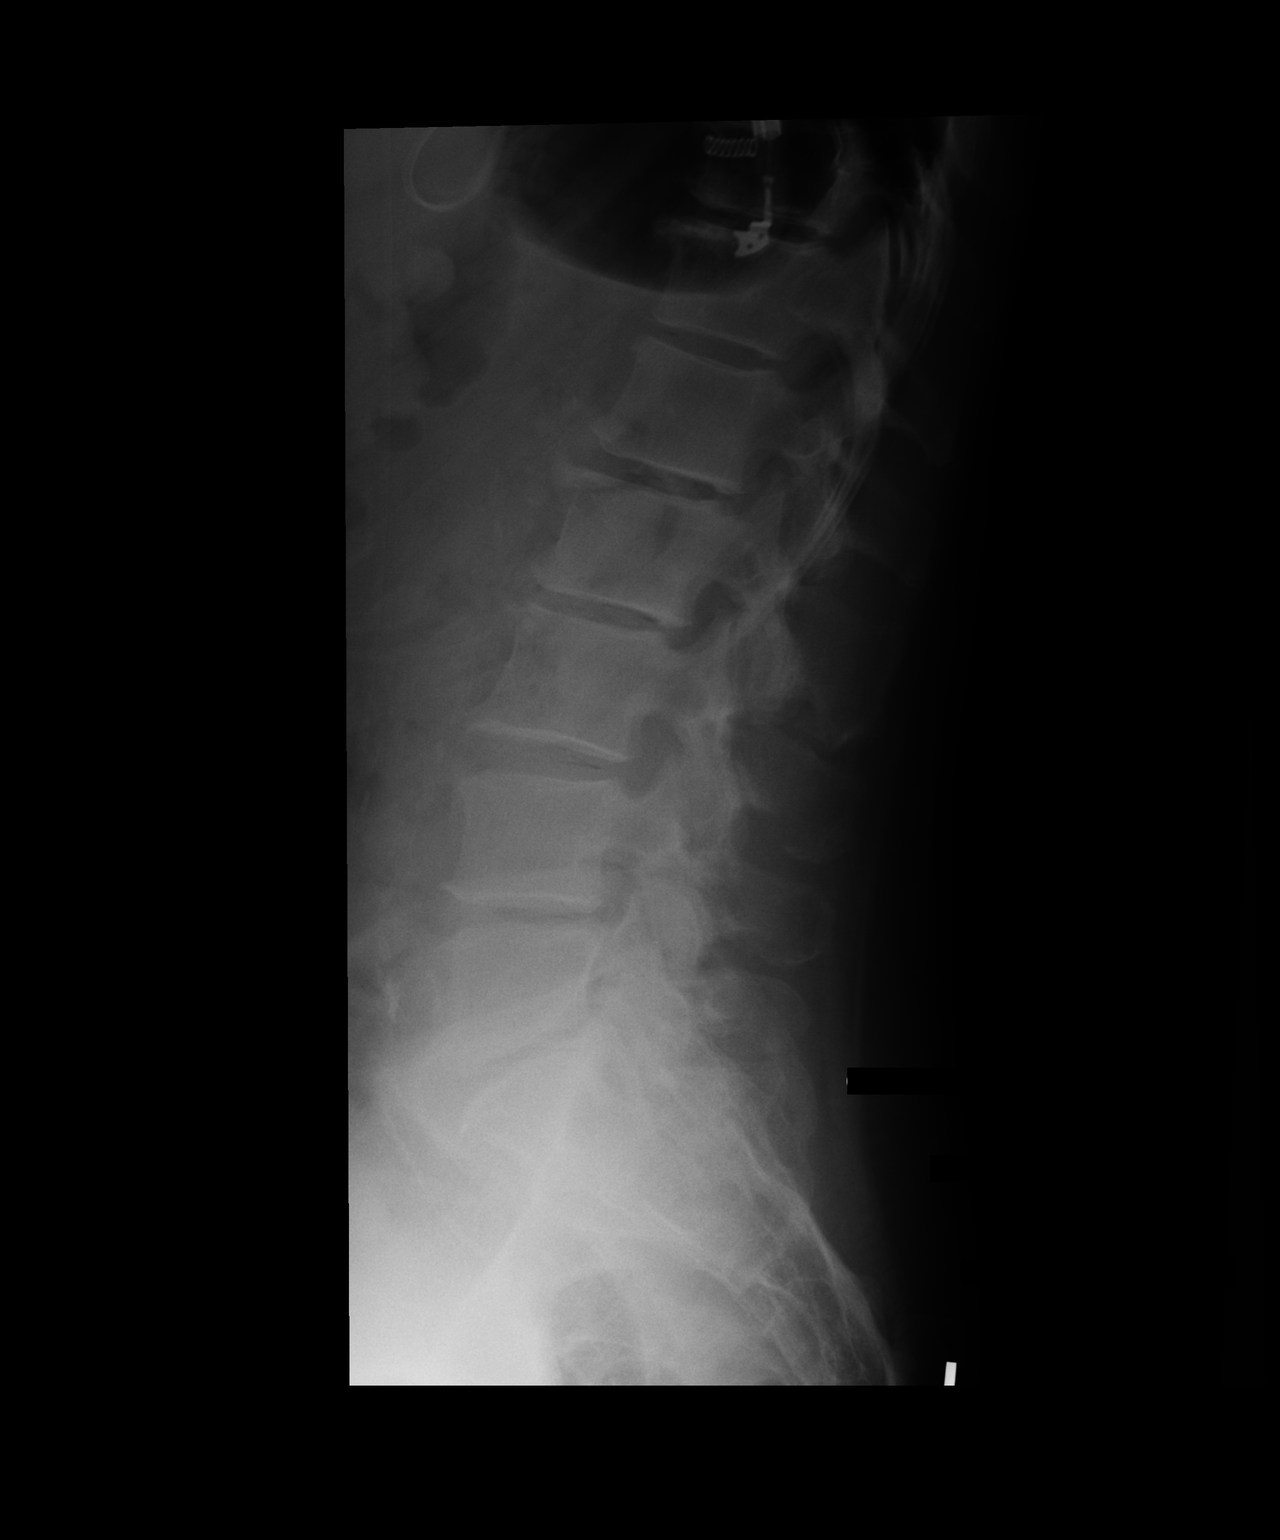

[1 of 1 positions shown; findings below may reference images not displayed]

FINDINGS: Single view shows five lumbar-type vertebral bodies with
disc space narrowing throughout the lumbar region most pronounced
L4-5 and L5-S1.  Levels are numbered for operative correlation.
IMPRESSION: Operative localization film.  Lumbar degenerative disc disease.
Levels marked for correlation.

## 2013-03-26 ENCOUNTER — Encounter: Payer: Self-pay | Admitting: Gastroenterology

## 2013-05-06 ENCOUNTER — Other Ambulatory Visit: Payer: Self-pay | Admitting: Otolaryngology

## 2013-05-06 DIAGNOSIS — R1310 Dysphagia, unspecified: Secondary | ICD-10-CM

## 2013-05-06 DIAGNOSIS — K219 Gastro-esophageal reflux disease without esophagitis: Secondary | ICD-10-CM

## 2013-05-18 ENCOUNTER — Ambulatory Visit
Admission: RE | Admit: 2013-05-18 | Discharge: 2013-05-18 | Disposition: A | Discharge status: Home / Self Care | Payer: 59 | Source: Ambulatory Visit | Attending: Otolaryngology | Admitting: Otolaryngology

## 2013-05-18 DIAGNOSIS — R1310 Dysphagia, unspecified: Secondary | ICD-10-CM

## 2013-05-18 DIAGNOSIS — K219 Gastro-esophageal reflux disease without esophagitis: Secondary | ICD-10-CM

## 2013-06-15 ENCOUNTER — Encounter: Payer: Self-pay | Admitting: Internal Medicine

## 2013-06-15 ENCOUNTER — Encounter (INDEPENDENT_AMBULATORY_CARE_PROVIDER_SITE_OTHER): Payer: 59 | Admitting: Internal Medicine

## 2013-06-15 DIAGNOSIS — Z1212 Encounter for screening for malignant neoplasm of rectum: Secondary | ICD-10-CM

## 2013-06-15 DIAGNOSIS — I10 Essential (primary) hypertension: Secondary | ICD-10-CM

## 2013-06-15 DIAGNOSIS — E559 Vitamin D deficiency, unspecified: Secondary | ICD-10-CM | POA: Insufficient documentation

## 2013-06-15 DIAGNOSIS — E782 Mixed hyperlipidemia: Secondary | ICD-10-CM | POA: Insufficient documentation

## 2013-06-15 HISTORY — DX: Mixed hyperlipidemia: E78.2

## 2013-06-15 NOTE — Progress Notes (Signed)
Patient ID: [name], female   DOB: 29-Aug-1950, 62 y.o.   MRN: [id number]   This encounter was created in error - please disregard.

## 2013-06-15 NOTE — Patient Instructions (Signed)
Continue diet & medications same as discussed.   Further disposition pending lab results.     Hypertension As your heart beats, it forces blood through your arteries. This force is your blood pressure. If the pressure is too high, it is called hypertension (HTN) or high blood pressure. HTN is dangerous because you may have it and not know it. High blood pressure may mean that your heart has to work harder to pump blood. Your arteries may be narrow or stiff. The extra work puts you at risk for heart disease, stroke, and other problems.  Blood pressure consists of two numbers, a higher number over a lower, 110/72, for example. It is stated as "110 over 72." The ideal is below 120 for the top number (systolic) and under 80 for the bottom (diastolic). Write down your blood pressure today. You should pay close attention to your blood pressure if you have certain conditions such as:  Heart failure.  Prior heart attack.  Diabetes  Chronic kidney disease.  Prior stroke.  Multiple risk factors for heart disease. To see if you have HTN, your blood pressure should be measured while you are seated with your arm held at the level of the heart. It should be measured at least twice. A one-time elevated blood pressure reading (especially in the Emergency Department) does not mean that you need treatment. There may be conditions in which the blood pressure is different between your right and left arms. It is important to see your caregiver soon for a recheck. Most people have essential hypertension which means that there is not a specific cause. This type of high blood pressure may be lowered by changing lifestyle factors such as:  Stress.  Smoking.  Lack of exercise.  Excessive weight.  Drug/tobacco/alcohol use.  Eating less salt. Most people do not have symptoms from high blood pressure until it has caused damage to the body. Effective treatment can often prevent, delay or reduce that  damage. TREATMENT  When a cause has been identified, treatment for high blood pressure is directed at the cause. There are a large number of medications to treat HTN. These fall into several categories, and your caregiver will help you select the medicines that are best for you. Medications may have side effects. You should review side effects with your caregiver. If your blood pressure stays high after you have made lifestyle changes or started on medicines,   Your medication(s) may need to be changed.  Other problems may need to be addressed.  Be certain you understand your prescriptions, and know how and when to take your medicine.  Be sure to follow up with your caregiver within the time frame advised (usually within two weeks) to have your blood pressure rechecked and to review your medications.  If you are taking more than one medicine to lower your blood pressure, make sure you know how and at what times they should be taken. Taking two medicines at the same time can result in blood pressure that is too low. SEEK IMMEDIATE MEDICAL CARE IF:  You develop a severe headache, blurred or changing vision, or confusion.  You have unusual weakness or numbness, or a faint feeling.  You have severe chest or abdominal pain, vomiting, or breathing problems. MAKE SURE YOU:   Understand these instructions.  Will watch your condition.  Will get help right away if you are not doing well or get worse. Document Released: 07/02/2005 Document Revised: 09/24/2011 Document Reviewed: 02/20/2008 [employer] Patient Information  2014 [place], [place].  Insulin Resistance Blood sugar (glucose) levels are controlled by a hormone called insulin. Insulin is made by your pancreas. When your blood glucose goes up, insulin is released into your blood. Insulin is required for your body to function normally. However, your body can become resistant to your own insulin or to insulin given to treat diabetes. In either  case, insulin resistance can lead to serious problems. These problems include:  Type 2 diabetes.  Heart disease.  High blood pressure.  Stroke.  Polycystic ovary syndrome.  Fatty liver. CAUSES  Insulin resistance can develop for many different reasons. It is more likely to happen in people with these conditions or characteristics:  Obesity.  Inactivity.  Pregnancy.  High blood pressure.  Stress.  Steroid use.  Infection or severe illness.  Increased levels of cholesterol and triglycerides. SYMPTOMS  There are no symptoms. You may have symptoms related to the various complications of insulin resistance.  DIAGNOSIS  Several different things can make your caregiver suspect you have insulin resistance. These include:  High blood glucose (hyperglycemia).  Abnormal cholesterol levels.  High uric acid levels.  Changes related to blood pressure.  Changes related to inflammation. Insulin resistance can be determined with blood tests. An elevated insulin level when you have not eaten might suggest resistance. Other more complicated tests are sometimes necessary. TREATMENT  Lifestyle changes are the most important treatment for insulin resistance.   If you are overweight and you have insulin resistance, you can improve your insulin sensitivity by losing weight.  Moderate exercise for 30 40 minutes, 4 days a week, can improve insulin sensitivity. Some medicines can also help improve your insulin sensitivity. Your caregiver can discuss these with you if they are appropriate.  HOME CARE INSTRUCTIONS   Do not smoke.  Keep your weight at a healthy level.  Get exercise.  If you have diabetes, follow your caregiver's directions.  If you have high blood pressure, follow your caregiver's directions.  Only take prescription medicines for pain, fever, or discomfort as directed by your caregiver. SEEK MEDICAL CARE IF:   You are diabetic and you are having problems keeping  your blood glucose levels at target range.  You are having episodes of low blood glucose (hypoglycemia).  You feel you might be having side effects from your medicines.  You have symptoms of an illness that is not improving after 3 4 days.  You have a sore or wound that is not healing.  You notice a change in vision or a new problem with your vision. SEEK IMMEDIATE MEDICAL CARE IF:   Your blood glucose goes below 70, especially if you have confusion, lightheadedness, or other symptoms with it.  Your blood glucose is very high (as advised by your caregiver) twice in a row.  You pass out.  You have chest pain or trouble breathing.  You have a sudden, severe headache.  You have sudden weakness in one arm or one leg.  You have sudden difficulty speaking or swallowing.  You develop vomiting or diarrhea that is getting worse or not improving after 1 day. Document Released: 08/21/2005 Document Revised: 01/01/2012 Document Reviewed: 12/11/2012 [facility] Patient Information 2014 [place], [place].   Cholesterol Cholesterol is a white, waxy, fat-like protein needed by your body in small amounts. The liver makes all the cholesterol you need. It is carried from the liver by the blood through the blood vessels. Deposits (plaque) may build up on blood vessel walls. This makes the arteries narrower  and stiffer. Plaque increases the risk for heart attack and stroke. You cannot feel your cholesterol level even if it is very high. The only way to know is by a blood test to check your lipid (fats) levels. Once you know your cholesterol levels, you should keep a record of the test results. Work with your caregiver to to keep your levels in the desired range. WHAT THE RESULTS MEAN:  Total cholesterol is a rough measure of all the cholesterol in your blood.  LDL is the so-called bad cholesterol. This is the type that deposits cholesterol in the walls of the arteries. You want this level to be  low.  HDL is the good cholesterol because it cleans the arteries and carries the LDL away. You want this level to be high.  Triglycerides are fat that the body can either burn for energy or store. High levels are closely linked to heart disease. DESIRED LEVELS:  Total cholesterol below 200.  LDL below 100 for people at risk, below 70 for very high risk.  HDL above 50 is good, above 60 is best.  Triglycerides below 150. HOW TO LOWER YOUR CHOLESTEROL:  Diet.  Choose fish or white meat chicken and [place], roasted or baked. Limit fatty cuts of red meat, fried foods, and processed meats, such as sausage and lunch meat.  Eat lots of fresh fruits and vegetables. Choose whole grains, beans, pasta, potatoes and cereals.  Use only small amounts of olive, corn or canola oils. Avoid butter, mayonnaise, shortening or palm kernel oils. Avoid foods with trans-fats.  Use skim/nonfat milk and low-fat/nonfat yogurt and cheeses. Avoid whole milk, cream, ice cream, egg yolks and cheeses. Healthy desserts include angel food cake, ginger snaps, animal crackers, hard candy, popsicles, and low-fat/nonfat frozen yogurt. Avoid pastries, cakes, pies and cookies.  Exercise.  A regular program helps decrease LDL and raises HDL.  Helps with weight control.  Do things that increase your activity level like gardening, walking, or taking the stairs.  Medication.  May be prescribed by your caregiver to help lowering cholesterol and the risk for heart disease.  You may need medicine even if your levels are normal if you have several risk factors. HOME CARE INSTRUCTIONS   Follow your diet and exercise programs as suggested by your caregiver.  Take medications as directed.  Have blood work done when your caregiver feels it is necessary. MAKE SURE YOU:   Understand these instructions.  Will watch your condition.  Will get help right away if you are not doing well or get worse. Document Released:  03/27/2001 Document Revised: 09/24/2011 Document Reviewed: 09/17/2007 [facility] Patient Information 2014 [place], [place].    Vitamin D Deficiency Vitamin D is an important vitamin that your body needs. Having too little of it in your body is called a deficiency. A very bad deficiency can make your bones soft and can cause a condition called rickets.  Vitamin D is important to your body for different reasons, such as:   It helps your body absorb 2 minerals called calcium and phosphorus.  It helps make your bones healthy.  It may prevent some diseases, such as diabetes and multiple sclerosis.  It helps your muscles and heart. You can get vitamin D in several ways. It is a natural part of some foods. The vitamin is also added to some dairy products and cereals. Some people take vitamin D supplements. Also, your body makes vitamin D when you are in the sun. It changes the sun's  rays into a form of the vitamin that your body can use. CAUSES   Not eating enough foods that contain vitamin D.  Not getting enough sunlight.  Having certain digestive system diseases that make it hard to absorb vitamin D. These diseases include Crohn's disease, chronic pancreatitis, and cystic fibrosis.  Having a surgery in which part of the stomach or small intestine is removed.  Being obese. Fat cells pull vitamin D out of your blood. That means that obese people may not have enough vitamin D left in their blood and in other body tissues.  Having chronic kidney or liver disease. RISK FACTORS Risk factors are things that make you more likely to develop a vitamin D deficiency. They include:  Being older.  Not being able to get outside very much.  Living in a nursing home.  Having had broken bones.  Having weak or thin bones (osteoporosis).  Having a disease or condition that changes how your body absorbs vitamin D.  Having dark skin.  Some medicines such as seizure medicines or steroids.  Being  overweight or obese. SYMPTOMS Mild cases of vitamin D deficiency may not have any symptoms. If you have a very bad case, symptoms may include:  Bone pain.  Muscle pain.  Falling often.  Broken bones caused by a minor injury, due to osteoporosis. DIAGNOSIS A blood test is the best way to tell if you have a vitamin D deficiency. TREATMENT Vitamin D deficiency can be treated in different ways. Treatment for vitamin D deficiency depends on what is causing it. Options include:  Taking vitamin D supplements.  Taking a calcium supplement. Your caregiver will suggest what dose is best for you. HOME CARE INSTRUCTIONS  Take any supplements that your caregiver prescribes. Follow the directions carefully. Take only the suggested amount.  Have your blood tested 2 months after you start taking supplements.  Eat foods that contain vitamin D. Healthy choices include:  Fortified dairy products, cereals, or juices. Fortified means vitamin D has been added to the food. Check the label on the package to be sure.  Fatty fish like salmon or trout.  Eggs.  Oysters.  Do not use a tanning bed.  Keep your weight at a healthy level. Lose weight if you need to.  Keep all follow-up appointments. Your caregiver will need to perform blood tests to make sure your vitamin D deficiency is going away. SEEK MEDICAL CARE IF:  You have any questions about your treatment.  You continue to have symptoms of vitamin D deficiency.  You have nausea or vomiting.  You are constipated.  You feel confused.  You have severe abdominal or back pain. MAKE SURE YOU:  Understand these instructions.  Will watch your condition.  Will get help right away if you are not doing well or get worse. Document Released: 09/24/2011 Document Revised: 10/27/2012 Document Reviewed: 09/24/2011 [facility] Patient Information 2014 [place], [place].

## 2013-06-16 ENCOUNTER — Encounter: Payer: Self-pay | Admitting: Internal Medicine

## 2013-07-10 IMAGING — CR DG HIP 1V PORT*R*
1 series · 1 of 1 positions shown · non-contrast
Comparison: None.

CLINICAL DATA: Postop hip arthroplasty.

PORTABLE RIGHT HIP - 1 VIEW

[lateral]
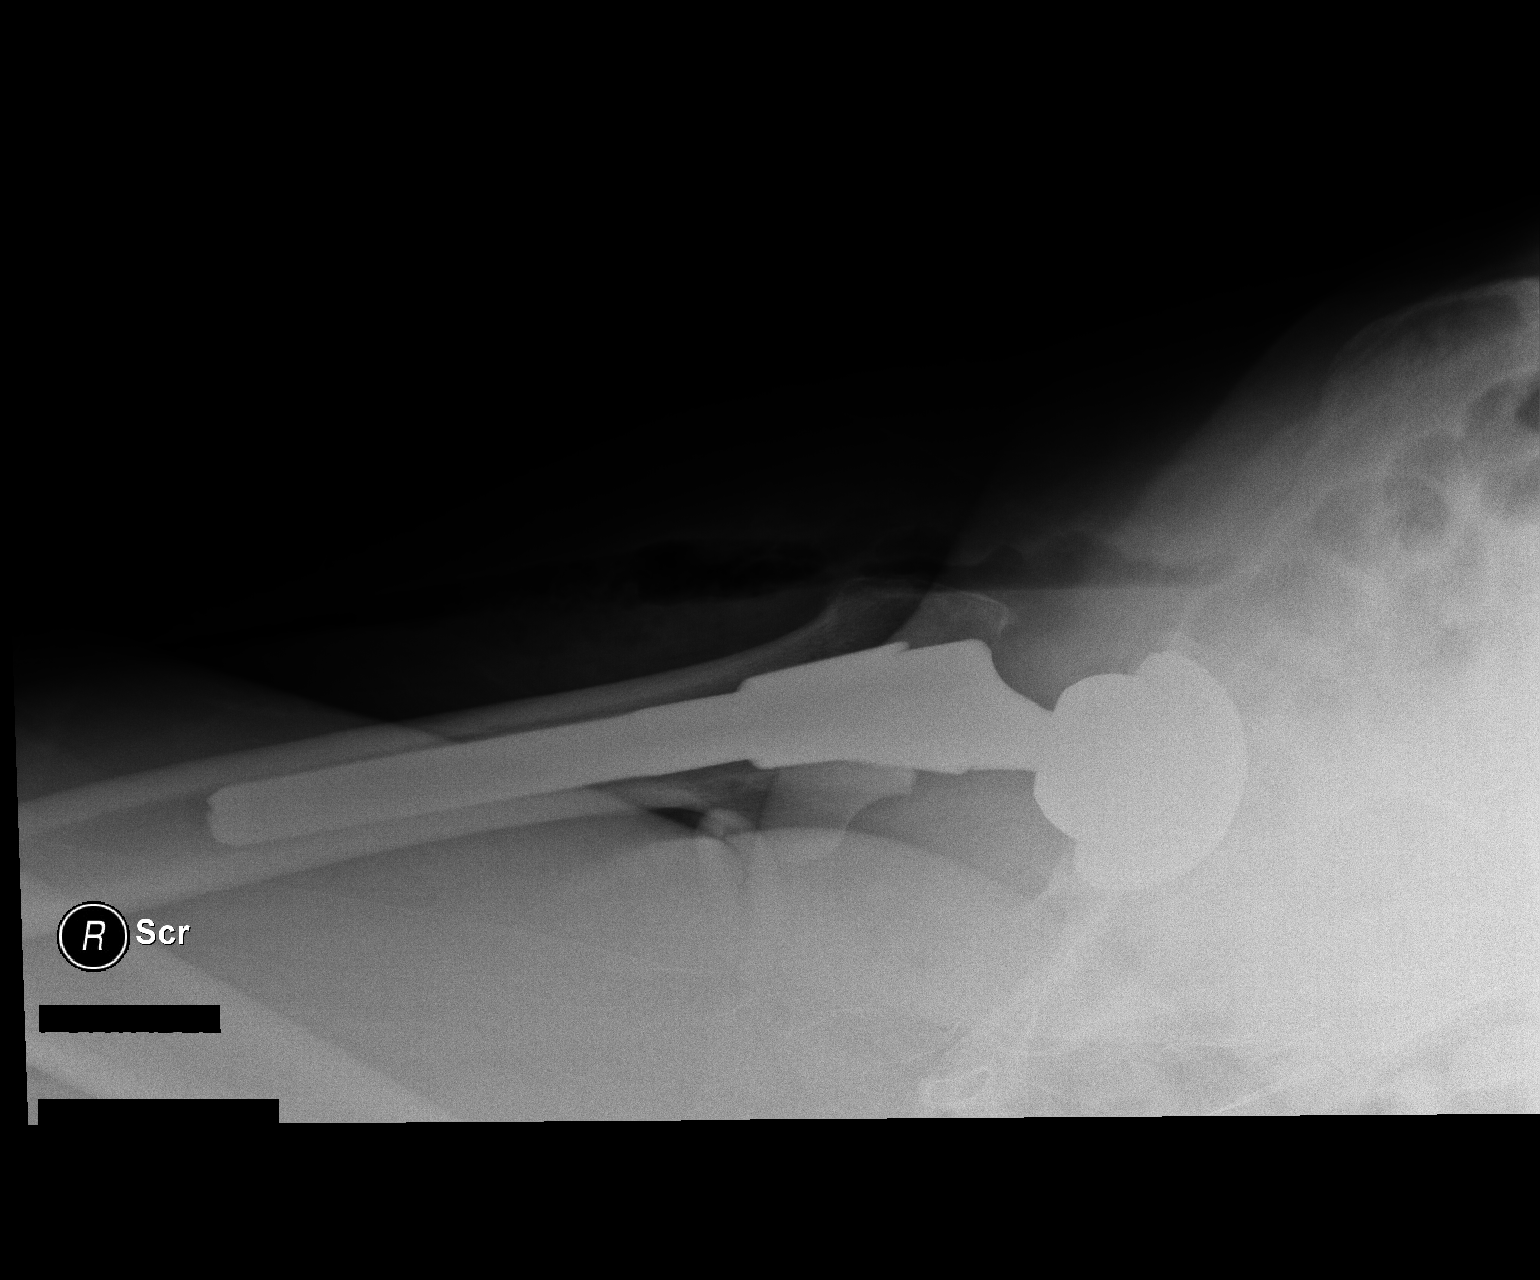

[1 of 1 positions shown; findings below may reference images not displayed]

FINDINGS: Single cross-table lateral view of a right hip
arthroplasty is submitted.  Femoral stem is well seated.
Subcutaneous air and fluid are noted.
IMPRESSION: Right total hip arthroplasty.  Femoral stem appears well seated.

## 2013-09-08 ENCOUNTER — Other Ambulatory Visit: Payer: Self-pay | Admitting: Internal Medicine

## 2013-11-05 ENCOUNTER — Encounter: Payer: Self-pay | Admitting: Gastroenterology

## 2013-12-01 ENCOUNTER — Encounter: Payer: Self-pay | Admitting: Gastroenterology

## 2014-01-25 ENCOUNTER — Encounter: Payer: Self-pay | Admitting: *Deleted

## 2014-01-25 ENCOUNTER — Ambulatory Visit (AMBULATORY_SURGERY_CENTER): Payer: 59 | Admitting: *Deleted

## 2014-01-25 VITALS — Ht 65.5 in | Wt 179.0 lb

## 2014-01-25 DIAGNOSIS — Z86010 Personal history of colonic polyps: Secondary | ICD-10-CM

## 2014-01-25 MED ORDER — NA SULFATE-K SULFATE-MG SULF 17.5-3.13-1.6 GM/177ML PO SOLN: 1.0000 | Freq: Once | ORAL | Status: DC

## 2014-01-25 NOTE — Progress Notes (Signed)
No egg or soy allergy. No anesthesia problems.  No home O2.  No diet meds.  

## 2014-02-08 ENCOUNTER — Ambulatory Visit (AMBULATORY_SURGERY_CENTER): Payer: 59 | Admitting: Gastroenterology

## 2014-02-08 ENCOUNTER — Encounter: Payer: Self-pay | Admitting: Gastroenterology

## 2014-02-08 VITALS — BP 146/64 | HR 51 | Temp 97.1°F | Resp 16 | Ht 65.0 in | Wt 179.0 lb

## 2014-02-08 DIAGNOSIS — D126 Benign neoplasm of colon, unspecified: Secondary | ICD-10-CM

## 2014-02-08 DIAGNOSIS — Z86010 Personal history of colonic polyps: Secondary | ICD-10-CM

## 2014-02-08 DIAGNOSIS — K6389 Other specified diseases of intestine: Secondary | ICD-10-CM

## 2014-02-08 DIAGNOSIS — K633 Ulcer of intestine: Secondary | ICD-10-CM

## 2014-02-08 DIAGNOSIS — K648 Other hemorrhoids: Secondary | ICD-10-CM

## 2014-02-08 MED ORDER — SODIUM CHLORIDE 0.9 % IV SOLN: 500.0000 mL | INTRAVENOUS | Status: DC

## 2014-02-08 NOTE — Patient Instructions (Signed)

## 2014-02-08 NOTE — Op Note (Signed)
[place]  [address] [place], [address]   COLONOSCOPY PROCEDURE REPORT  PATIENT: [name], [name]  MR#: [id number] BIRTHDATE: 23-Apr-1951 , 41  yrs. old GENDER: Female ENDOSCOPIST: Inda Castle, MD REFERRED HC:WCBJSEG Melford Aase, M.D. PROCEDURE DATE:  02/08/2014 PROCEDURE:   Colonoscopy with biopsy and Colonoscopy with snare polypectomy First Screening Colonoscopy - Avg.  risk and is 50 yrs.  old or older - No.  Prior Negative Screening - Now for repeat screening. N/A  History of Adenoma - Now for follow-up colonoscopy & has been > or = to 3 yrs.  Yes hx of adenoma.  Has been 3 or more years since last colonoscopy.  Polyps Removed Today? Yes. ASA CLASS:   Class II INDICATIONS:Patient's personal history of colon polyps 2010 MEDICATIONS: MAC sedation, administered by CRNA and propofol (Diprivan) 400mg  IV  DESCRIPTION OF PROCEDURE:   After the risks benefits and alternatives of the procedure were thoroughly explained, informed consent was obtained.  A digital rectal exam revealed no abnormalities of the rectum.   The [id number]  endoscope was introduced through the anus and advanced to the cecum, which was identified by both the appendix and ileocecal valve. No adverse events experienced.   The quality of the prep was Suprep good  The instrument was then slowly withdrawn as the colon was fully examined.      COLON FINDINGS: Asuperficial ulcer was found at the splenic flexure. There was mild surrounding inflammatory changes.  Biopsies were taken.  A sessile polyp measuring 6 mm in size was found in the distal descending colon.  A polypectomy was performed using snare cautery.  The resection was complete and the polyp tissue was completely retrieved.   Internal hemorrhoids were found.   The colon was otherwise normal.  There was no diverticulosis, inflammation, polyps or cancers unless previously stated. Retroflexed views revealed no  abnormalities. The time to cecum=7 minutes 04 seconds.  Withdrawal time=16 minutes 49 seconds.  The scope was withdrawn and the procedure completed. COMPLICATIONS: There were no complications.  ENDOSCOPIC IMPRESSION: 1.   Ulcer at the splenic flexure 2.   Sessile polyp measuring 6 mm in size was found in the distal descending colon; polypectomy was performed using snare cautery 3.   Internal hemorrhoids 4.   The colon was otherwise normal  RECOMMENDATIONS: If the polyp(s) removed today are proven to be adenomatous (pre-cancerous) polyps, you will need a repeat colonoscopy in 5 years.  Otherwise you should continue to follow colorectal cancer screening guidelines for "routine risk" patients with colonoscopy in 10 years.  You will receive a letter within 1-2 weeks with the results of your biopsy as well as final recommendations.  Please call my office if you have not received a letter after 3 weeks.   eSigned:  Inda Castle, MD 02/08/2014 10:24 AM   cc:   PATIENT NAME:  [name], [name] MR#: [id number]

## 2014-02-08 NOTE — Progress Notes (Signed)
Pt drank 8 oz of water at 730 and smoke marijuana yesterday.  Josh, CRNA made aware

## 2014-02-08 NOTE — Progress Notes (Signed)
Called to room to assist during endoscopic procedure.  Patient ID and intended procedure confirmed with present staff. Received instructions for my participation in the procedure from the performing physician.  

## 2014-02-08 NOTE — Progress Notes (Signed)
Report to PACU, RN, vss, BBS= Clear.  

## 2014-02-09 ENCOUNTER — Telehealth: Payer: Self-pay | Admitting: *Deleted

## 2014-02-09 NOTE — Telephone Encounter (Signed)
  Follow up Call-  Call back number 02/08/2014  Post procedure Call Back phone  # [phone]  Permission to leave phone message Yes     Patient questions:  Do you have a fever, pain , or abdominal swelling? No. Pain Score  0 *  Have you tolerated food without any problems? Yes.    Have you been able to return to your normal activities? Yes.    Do you have any questions about your discharge instructions: Diet   No. Medications  No. Follow up visit  No.  Do you have questions or concerns about your Care? No.  Actions: * If pain score is 4 or above: No action needed, pain <4.

## 2014-02-11 ENCOUNTER — Encounter: Payer: Self-pay | Admitting: Gastroenterology

## 2014-02-25 ENCOUNTER — Ambulatory Visit (INDEPENDENT_AMBULATORY_CARE_PROVIDER_SITE_OTHER): Payer: 59 | Admitting: Podiatry

## 2014-02-25 ENCOUNTER — Ambulatory Visit (INDEPENDENT_AMBULATORY_CARE_PROVIDER_SITE_OTHER): Payer: 59

## 2014-02-25 ENCOUNTER — Other Ambulatory Visit: Payer: Self-pay | Admitting: Podiatry

## 2014-02-25 ENCOUNTER — Encounter: Payer: Self-pay | Admitting: Podiatry

## 2014-02-25 VITALS — BP 152/78 | HR 49 | Resp 15 | Ht 65.0 in | Wt 170.0 lb

## 2014-02-25 DIAGNOSIS — M7750 Other enthesopathy of unspecified foot: Secondary | ICD-10-CM

## 2014-02-25 DIAGNOSIS — M21619 Bunion of unspecified foot: Secondary | ICD-10-CM

## 2014-02-25 DIAGNOSIS — M2010 Hallux valgus (acquired), unspecified foot: Secondary | ICD-10-CM

## 2014-02-25 MED ORDER — DICLOFENAC SODIUM 75 MG PO TBEC: 75.0000 mg | DELAYED_RELEASE_TABLET | Freq: Two times a day (BID) | ORAL | Status: DC

## 2014-02-25 NOTE — Progress Notes (Signed)
Subjective:     Patient ID: [name], female   DOB: [birth year], 63 y.o.   MRN: [id number]  HPI patient presents with structural bunion deformity of both feet that get red with callus formation on the medial side of the big toe left and also is concerned that she's had swelling in her left lower leg left lateral ankle that did have a ultrasound done negative for indications of blood clot   Review of Systems  All other systems reviewed and are negative.      Objective:   Physical Exam  Nursing note and vitals reviewed. Constitutional: She is oriented to person, place, and time.  Cardiovascular: Intact distal pulses.   Musculoskeletal: Normal range of motion.  Neurological: She is oriented to person, place, and time.  Skin: Skin is warm and dry.   neurovascular status intact with muscle strength adequate range of motion within normal limits and hyperostosis medial aspect first metatarsal head both feet with redness and moderate deviation of the left big toe with keratotic lesion on the left big toe secondary to structure that's occurring associated with bunion. Negative for pitting edema of the left ankle or lower leg and negative Homans sign was noted     Assessment:     Moderate structural bunion deformity which may cause some change in gait which may be related to her other problems or those problems may be separate with no organic indication that I could see    Plan:     H&P and x-rays reviewed. Discussed structural bunion deformity and recommended correction at one point in future depending on schedule and advised on its relationship to the keratotic lesion formation. If swelling were to get worse or persist I'm going to see her back at this point I do not see organic cause

## 2014-02-25 NOTE — Progress Notes (Signed)
   Subjective:    Patient ID: [name], female    DOB: [birth year], 63 y.o.   MRN: [id number]  HPI Comments: Pt complains of left 1st MPJ pain with callous to the medial area, worsens with long period of standing and enclosed shoes.  Pt states had left leg swelling 1 week ago, screened at urgent care negative for DVT per pt.     Review of Systems  Cardiovascular: Positive for leg swelling.  Allergic/Immunologic: Positive for environmental allergies.       Trees, grasses  All other systems reviewed and are negative.      Objective:   Physical Exam        Assessment & Plan:

## 2014-03-20 DIAGNOSIS — Z79899 Other long term (current) drug therapy: Secondary | ICD-10-CM | POA: Insufficient documentation

## 2014-03-20 DIAGNOSIS — R7309 Other abnormal glucose: Secondary | ICD-10-CM | POA: Insufficient documentation

## 2014-03-20 NOTE — Patient Instructions (Signed)

## 2014-03-20 NOTE — Progress Notes (Signed)
Patient ID: [name], female   DOB: 27-Nov-1950, 63 y.o.   MRN: [id number]   This very nice 63 y.o.married [place] female presents for 3 month follow up with Hypertension, Hyperlipidemia, Pre-Diabetes and Vitamin D Deficiency.    Patient is treated for HTN & BP has been controlled at home. Today's BP: 122/60 mmHg. Patient denies any cardiac type chest pain, palpitations, dyspnea/orthopnea/PND, claudication, or dependent edema. She doe s report being occasionally sl light- headed with rapid standing.   Hyperlipidemia is controlled to goal with diet. Last Lipids were Chol 176, TG 121, HDL 74, and LDL 78 - in Oct 2014 - all at goal.   Also, the patient has history of PreDiabetes and patient denies any symptoms of reactive hypoglycemia, diabetic polys, paresthesias or visual blurring.  Last A1c was  6.0% in Oct 2014   Further, Patient has history of Vitamin D Deficiency and patient supplements vitamin D without any suspected side-effects. Last vitamin D was  59 in Oct 2014.   Medication List   albuterol 108 (90 BASE) MCG/ACT inhaler  Commonly known as:  PROVENTIL HFA;VENTOLIN HFA  Inhale 2 puffs into the lungs every 6 (six) hours as needed. For shortness of breath     ALEVE PO  Take by mouth.     aspirin 81 MG tablet  Take 81 mg by mouth daily.     bisoprolol-hydrochlorothiazide 10-6.25 MG per tablet  Commonly known as:  ZIAC  Take 1 tablet by mouth daily.     clonazePAM 0.5 MG tablet  Commonly known as:  KLONOPIN  TAKE 1/2-1 TABLET BY MOUTH AT BEDTIME FOR SLEEP     cyclobenzaprine 10 MG tablet  Commonly known as:  FLEXERIL  Take 10 mg by mouth 2 (two) times daily as needed.     enalapril 20 MG tablet  Commonly known as:  VASOTEC  Take 20 mg by mouth daily.     FISH OIL PO  Take by mouth.     FLONASE NA  Place into the nose.     MULTI COMPLETE PO  Take 1 capsule by mouth daily.     omeprazole 40 MG capsule  Commonly known as:  PRILOSEC  Take 40 mg by mouth  daily.     Vitamin D 2000 UNITS tablet  Take 2,000 Units by mouth daily.     No Known Allergies  PMHx:   Past Medical History  Diagnosis Date  . Hypertension   . Hyperlipidemia   . Anxiety   . Diabetes mellitus without complication     controlled by diet  . Arthritis     osteoarthritis  . PONV (postoperative nausea and vomiting)   . Environmental allergies   . Urgency of urination     Takes oxybutynin; under control at this time  . Allergy     enviromental   FHx:    Reviewed / unchanged SHx:    Reviewed / unchanged  Systems Review:  Constitutional: Denies fever, chills, wt changes, headaches, insomnia, fatigue, night sweats, change in appetite. Eyes: Denies redness, blurred vision, diplopia, discharge, itchy, watery eyes.  ENT: Denies discharge, congestion, post nasal drip, epistaxis, sore throat, earache, hearing loss, dental pain, tinnitus, vertigo, sinus pain, snoring.  CV: Denies chest pain, palpitations, irregular heartbeat, syncope, dyspnea, diaphoresis, orthopnea, PND, claudication or edema. Respiratory: denies cough, dyspnea, DOE, pleurisy, hoarseness, laryngitis, wheezing.  Gastrointestinal: Denies dysphagia, odynophagia, heartburn, reflux, water brash, abdominal pain or cramps, nausea, vomiting, bloating, diarrhea, constipation, hematemesis, melena, hematochezia  or hemorrhoids. Genitourinary: Denies dysuria, frequency, urgency, nocturia, hesitancy, discharge, hematuria or flank pain. Musculoskeletal: Denies arthralgias, myalgias, stiffness, jt. swelling, pain, limping or strain/sprain.  Skin: Denies pruritus, rash, hives, warts, acne, eczema or change in skin lesion(s). Neuro: No weakness, tremor, incoordination, spasms, paresthesia or pain. Psychiatric: Denies confusion, memory loss or sensory loss. Does report occasionally is forgetful. Occasional anxiety.  Endo: Denies change in weight, skin or hair change.  Heme/Lymph: No excessive bleeding, bruising or  enlarged lymph nodes.  Exam:  BP 122/60  Pulse 56  Temp 97.5 F   Resp 16  Ht 5' 5.25"   Wt 175 lb 6.4 oz   BMI 28.98   Appears well nourished and in no distress. Eyes: PERRLA, EOMs, conjunctiva no swelling or erythema. Sinuses: No frontal/maxillary tenderness ENT/Mouth: EAC's clear, TM's nl w/o erythema, bulging. Nares clear w/o erythema, swelling, exudates. Oropharynx clear without erythema or exudates. Oral hygiene is good. Tongue normal, non obstructing. Hearing intact.  Neck: Supple. Thyroid nl. Car 2+/2+ without bruits, nodes or JVD. Chest: Respirations nl with BS clear & equal w/o rales, rhonchi, wheezing or stridor.  Cor: Heart sounds normal w/ regular rate and rhythm without sig. murmurs, gallops, clicks, or rubs. Peripheral pulses normal and equal  without edema.  Abdomen: Soft & bowel sounds normal. Non-tender w/o guarding, rebound, hernias, masses, or organomegaly.  Lymphatics: Unremarkable.  Musculoskeletal: Full ROM all peripheral extremities, joint stability, 5/5 strength, and normal gait.  Skin: Warm, dry without exposed rashes, lesions or ecchymosis apparent.  Neuro: Cranial nerves intact, reflexes equal bilaterally. Sensory-motor testing grossly intact. Tendon reflexes grossly intact.  Pysch: Alert & oriented x 3.  Insight and judgement nl & appropriate. No ideations.   Assessment and Plan:  1. Hypertension - Continue monitor blood pressure at home. Continue diet/meds same.  2. Hyperlipidemia - Continue diet/meds, exercise,& lifestyle modifications. Continue monitor periodic cholesterol/liver & renal functions   3. Pre-Diabetes - Continue diet, exercise, lifestyle modifications. Monitor appropriate labs.    4. Vitamin D Deficiency - Continue supplementation.  Recommended regular exercise, BP monitoring, weight control, and discussed med and SE's. Recommended labs to assess and monitor clinical status. Further disposition pending results of labs.

## 2014-03-23 ENCOUNTER — Ambulatory Visit (INDEPENDENT_AMBULATORY_CARE_PROVIDER_SITE_OTHER): Payer: 59 | Admitting: Internal Medicine

## 2014-03-23 ENCOUNTER — Encounter: Payer: Self-pay | Admitting: Internal Medicine

## 2014-03-23 VITALS — BP 122/60 | HR 56 | Temp 97.5°F | Resp 16 | Ht 65.25 in | Wt 175.4 lb

## 2014-03-23 DIAGNOSIS — E559 Vitamin D deficiency, unspecified: Secondary | ICD-10-CM

## 2014-03-23 DIAGNOSIS — Z79899 Other long term (current) drug therapy: Secondary | ICD-10-CM

## 2014-03-23 DIAGNOSIS — R7309 Other abnormal glucose: Secondary | ICD-10-CM

## 2014-03-23 DIAGNOSIS — E782 Mixed hyperlipidemia: Secondary | ICD-10-CM

## 2014-03-23 DIAGNOSIS — I10 Essential (primary) hypertension: Secondary | ICD-10-CM

## 2014-03-23 LAB — CBC WITH DIFFERENTIAL/PLATELET
Basophils Absolute: 0.1 10*3/uL (ref 0.0–0.1)
Basophils Relative: 1 % (ref 0–1)
Eosinophils Absolute: 0.2 10*3/uL (ref 0.0–0.7)
Eosinophils Relative: 3 % (ref 0–5)
HCT: 37.8 % (ref 36.0–46.0)
HEMOGLOBIN: 13.1 g/dL (ref 12.0–15.0)
LYMPHS PCT: 43 % (ref 12–46)
Lymphs Abs: 2.6 10*3/uL (ref 0.7–4.0)
MCH: 26.2 pg (ref 26.0–34.0)
MCHC: 34.7 g/dL (ref 30.0–36.0)
MCV: 75.6 fL — ABNORMAL LOW (ref 78.0–100.0)
MONO ABS: 0.5 10*3/uL (ref 0.1–1.0)
MONOS PCT: 8 % (ref 3–12)
NEUTROS PCT: 45 % (ref 43–77)
Neutro Abs: 2.7 10*3/uL (ref 1.7–7.7)
Platelets: 292 10*3/uL (ref 150–400)
RBC: 5 MIL/uL (ref 3.87–5.11)
RDW: 15.5 % (ref 11.5–15.5)
WBC: 6 10*3/uL (ref 4.0–10.5)

## 2014-03-23 LAB — HEMOGLOBIN A1C
HEMOGLOBIN A1C: 6 % — AB (ref <5.7)
Mean Plasma Glucose: 126 mg/dL — ABNORMAL HIGH (ref <117)

## 2014-03-24 LAB — HEPATIC FUNCTION PANEL
ALK PHOS: 74 U/L (ref 39–117)
ALT: 8 U/L (ref 0–35)
AST: 17 U/L (ref 0–37)
Albumin: 4 g/dL (ref 3.5–5.2)
BILIRUBIN INDIRECT: 0.3 mg/dL (ref 0.2–1.2)
Bilirubin, Direct: 0.1 mg/dL (ref 0.0–0.3)
TOTAL PROTEIN: 6.4 g/dL (ref 6.0–8.3)
Total Bilirubin: 0.4 mg/dL (ref 0.2–1.2)

## 2014-03-24 LAB — LIPID PANEL
Cholesterol: 236 mg/dL — ABNORMAL HIGH (ref 0–200)
HDL: 70 mg/dL (ref >39)
LDL Cholesterol: 149 mg/dL — ABNORMAL HIGH (ref 0–99)
Total CHOL/HDL Ratio: 3.4 Ratio
Triglycerides: 86 mg/dL (ref <150)
VLDL: 17 mg/dL (ref 0–40)

## 2014-03-24 LAB — VITAMIN D 25 HYDROXY (VIT D DEFICIENCY, FRACTURES): Vit D, 25-Hydroxy: 68 ng/mL (ref 30–89)

## 2014-03-24 LAB — TSH: TSH: 0.442 u[IU]/mL (ref 0.350–4.500)

## 2014-03-24 LAB — BASIC METABOLIC PANEL WITH GFR
BUN: 17 mg/dL (ref 6–23)
CHLORIDE: 102 meq/L (ref 96–112)
CO2: 28 mEq/L (ref 19–32)
Calcium: 9.5 mg/dL (ref 8.4–10.5)
Creat: 0.71 mg/dL (ref 0.50–1.10)
GFR, Est African American: >89 mL/min
GFR, Est Non African American: >89 mL/min
Glucose, Bld: 99 mg/dL (ref 70–99)
Potassium: 4 mEq/L (ref 3.5–5.3)
SODIUM: 138 meq/L (ref 135–145)

## 2014-03-24 LAB — INSULIN, FASTING: Insulin fasting, serum: 5.1 u[IU]/mL (ref 2.0–19.6)

## 2014-03-24 LAB — MAGNESIUM: Magnesium: 1.6 mg/dL (ref 1.5–2.5)

## 2014-04-08 ENCOUNTER — Other Ambulatory Visit: Payer: Self-pay | Admitting: Internal Medicine

## 2014-04-29 ENCOUNTER — Encounter: Payer: Self-pay | Admitting: Internal Medicine

## 2014-06-22 ENCOUNTER — Encounter: Payer: Self-pay | Admitting: Internal Medicine

## 2014-06-22 ENCOUNTER — Ambulatory Visit: Payer: Self-pay | Admitting: Physician Assistant

## 2014-06-29 ENCOUNTER — Ambulatory Visit (INDEPENDENT_AMBULATORY_CARE_PROVIDER_SITE_OTHER): Payer: 59 | Admitting: Physician Assistant

## 2014-06-29 ENCOUNTER — Encounter: Payer: Self-pay | Admitting: Physician Assistant

## 2014-06-29 VITALS — BP 128/78 | HR 60 | Temp 97.7°F | Resp 16 | Wt 165.0 lb

## 2014-06-29 DIAGNOSIS — R7303 Prediabetes: Secondary | ICD-10-CM

## 2014-06-29 DIAGNOSIS — R7309 Other abnormal glucose: Secondary | ICD-10-CM

## 2014-06-29 DIAGNOSIS — N3000 Acute cystitis without hematuria: Secondary | ICD-10-CM

## 2014-06-29 DIAGNOSIS — E782 Mixed hyperlipidemia: Secondary | ICD-10-CM

## 2014-06-29 DIAGNOSIS — E559 Vitamin D deficiency, unspecified: Secondary | ICD-10-CM

## 2014-06-29 DIAGNOSIS — Z79899 Other long term (current) drug therapy: Secondary | ICD-10-CM

## 2014-06-29 DIAGNOSIS — I10 Essential (primary) hypertension: Secondary | ICD-10-CM

## 2014-06-29 LAB — CBC WITH DIFFERENTIAL/PLATELET
BASOS ABS: 0 10*3/uL (ref 0.0–0.1)
Basophils Relative: 0 % (ref 0–1)
EOS ABS: 0.1 10*3/uL (ref 0.0–0.7)
EOS PCT: 2 % (ref 0–5)
HCT: 40 % (ref 36.0–46.0)
Hemoglobin: 13.3 g/dL (ref 12.0–15.0)
LYMPHS ABS: 2.8 10*3/uL (ref 0.7–4.0)
Lymphocytes Relative: 38 % (ref 12–46)
MCH: 26.5 pg (ref 26.0–34.0)
MCHC: 33.3 g/dL (ref 30.0–36.0)
MCV: 79.8 fL (ref 78.0–100.0)
MPV: 10.4 fL (ref 9.4–12.4)
Monocytes Absolute: 0.5 10*3/uL (ref 0.1–1.0)
Monocytes Relative: 7 % (ref 3–12)
Neutro Abs: 3.9 10*3/uL (ref 1.7–7.7)
Neutrophils Relative %: 53 % (ref 43–77)
PLATELETS: 253 10*3/uL (ref 150–400)
RBC: 5.01 MIL/uL (ref 3.87–5.11)
RDW: 15.3 % (ref 11.5–15.5)
WBC: 7.3 10*3/uL (ref 4.0–10.5)

## 2014-06-29 MED ORDER — CIPROFLOXACIN HCL 500 MG PO TABS: 500.0000 mg | ORAL_TABLET | Freq: Two times a day (BID) | ORAL | Status: AC

## 2014-06-29 NOTE — Progress Notes (Signed)
Assessment and Plan:  Hypertension: Continue medication, monitor blood pressure at home. Continue DASH diet.  Reminder to go to the ER if any CP, SOB, nausea, dizziness, severe HA, changes vision/speech, left arm numbness and tingling, and jaw pain. Cholesterol: Continue diet and exercise. Check cholesterol.  Pre-diabetes-Continue diet and exercise. Check A1C Vitamin D Def- check level and continue medications.  UTI?- versus OAB/vaginal dryness- will check UA, C&S. Will start cipro now due to pain  Continue diet and meds as discussed. Further disposition pending results of labs.  HPI 63 y.o. female  presents for 3 month follow up with hypertension, hyperlipidemia, prediabetes and vitamin D. Her blood pressure has been controlled at home, today their BP is BP: 128/78 mmHg She does not workout. She denies chest pain, shortness of breath, dizziness.  She is not on cholesterol medication and denies myalgias. Her cholesterol is not at goal. The cholesterol last visit was:   Lab Results  Component Value Date   CHOL 236* 03/23/2014   HDL 70 03/23/2014   LDLCALC 149* 03/23/2014   TRIG 86 03/23/2014   CHOLHDL 3.4 03/23/2014   She has been working on diet and exercise for prediabetes, she has increased her veggies and fruit, and denies paresthesia of the feet, polydipsia, polyuria and visual disturbances. Last A1C in the office was:  Lab Results  Component Value Date   HGBA1C 6.0* 03/23/2014   Patient is on Vitamin D supplement.   Lab Results  Component Value Date   VD25OH 68 03/23/2014     She states she has has frequency for 6-8 weeks, nocturia x1-2, worse with hearing running water, feels full but only small amount, then this past week she has had dysuria, left flank pain, and worsening odor. She has increased water and started cranberry juice, no fever, chills, She does have vaginal dryness. She denies vaginal discharge.  BMI is Body mass index is 27.26 kg/(m^2)., she is working on diet  and exercise. Wt Readings from Last 3 Encounters:  06/29/14 165 lb (74.844 kg)  03/23/14 175 lb 6.4 oz (79.561 kg)  02/25/14 170 lb (77.111 kg)    Current Medications:  Current Outpatient Prescriptions on File Prior to Visit  Medication Sig Dispense Refill  . albuterol (PROVENTIL HFA;VENTOLIN HFA) 108 (90 BASE) MCG/ACT inhaler Inhale 2 puffs into the lungs every 6 (six) hours as needed. For shortness of breath    . aspirin 81 MG tablet Take 81 mg by mouth daily.    . bisoprolol-hydrochlorothiazide (ZIAC) 10-6.25 MG per tablet TAKE 1 TABLET BY MOUTH EVERY MORNING FOR BLOOD PRESSURE 90 tablet PRN  . Cholecalciferol (VITAMIN D) 2000 UNITS tablet Take 2,000 Units by mouth daily.    . clonazePAM (KLONOPIN) 0.5 MG tablet TAKE 1/2-1 TABLET BY MOUTH AT BEDTIME FOR SLEEP 30 tablet 0  . cyclobenzaprine (FLEXERIL) 10 MG tablet Take 10 mg by mouth 2 (two) times daily as needed.    . enalapril (VASOTEC) 20 MG tablet Take 20 mg by mouth daily.    . Fluticasone Propionate (FLONASE NA) Place into the nose.    . Multiple Vitamins-Minerals (MULTI COMPLETE PO) Take 1 capsule by mouth daily.    . Naproxen Sodium (ALEVE PO) Take by mouth.    . Omega-3 Fatty Acids (FISH OIL PO) Take by mouth.    Marland Kitchen omeprazole (PRILOSEC) 40 MG capsule Take 40 mg by mouth daily.     No current facility-administered medications on file prior to visit.   Medical History:  Past Medical  History  Diagnosis Date  . Hypertension   . Hyperlipidemia   . Anxiety   . Diabetes mellitus without complication     controlled by diet  . Arthritis     osteoarthritis  . PONV (postoperative nausea and vomiting)   . Environmental allergies   . Urgency of urination     Takes oxybutynin; under control at this time  . Allergy     enviromental   Allergies: No Known Allergies   Review of Systems:  Review of Systems  Constitutional: Negative.  Negative for fever, chills, malaise/fatigue and diaphoresis.  HENT: Positive for congestion.  Negative for ear discharge, ear pain, hearing loss, nosebleeds, sore throat and tinnitus.   Eyes: Negative.   Respiratory: Negative.  Negative for stridor.   Cardiovascular: Negative.   Gastrointestinal: Negative.   Genitourinary: Positive for dysuria, urgency, frequency and flank pain (left). Negative for hematuria.  Musculoskeletal: Negative.   Skin: Negative.   Neurological: Negative.   Endo/Heme/Allergies: Negative.   Psychiatric/Behavioral: Negative.     Family history- Review and unchanged Social history- Review and unchanged Physical Exam: BP 128/78 mmHg  Pulse 60  Temp(Src) 97.7 F (36.5 C)  Resp 16  Wt 165 lb (74.844 kg) Wt Readings from Last 3 Encounters:  06/29/14 165 lb (74.844 kg)  03/23/14 175 lb 6.4 oz (79.561 kg)  02/25/14 170 lb (77.111 kg)   General Appearance: Well nourished, in no apparent distress. Eyes: PERRLA, EOMs, conjunctiva no swelling or erythema Sinuses: No Frontal/maxillary tenderness ENT/Mouth: Ext aud canals clear, TMs without erythema, bulging. No erythema, swelling, or exudate on post pharynx.  Tonsils not swollen or erythematous. Hearing normal.  Neck: Supple, thyroid normal.  Respiratory: Respiratory effort normal, BS equal bilaterally without rales, rhonchi, wheezing or stridor.  Cardio: RRR with no MRGs. Brisk peripheral pulses without edema.  Abdomen: No CVA or suprapubic tenderness. Soft, + BS.  Non tender, no guarding, rebound, hernias, masses. Lymphatics: Non tender without lymphadenopathy.  Musculoskeletal: Full ROM, 5/5 strength, normal gait.  Skin: Warm, dry without rashes, lesions, ecchymosis.  Neuro: Cranial nerves intact. Normal muscle tone, no cerebellar symptoms.  Psych: Awake and oriented X 3, normal affect, Insight and Judgment appropriate.    Vicie Mutters, PA-C 10:13 AM [facility]

## 2014-06-29 NOTE — Patient Instructions (Signed)
Bad carbs also include fruit juice, alcohol, and sweet tea. These are empty calories that do not signal to your brain that you are full.   Please remember the good carbs are still carbs which convert into sugar. So please measure them out no more than 1/2-1 cup of rice, oatmeal, pasta, and beans  Veggies are however free foods! Pile them on.   Not all fruit is created equal. Please see the list below, the fruit at the bottom is higher in sugars than the fruit at the top. Please avoid all dried fruits.    Urinary Tract Infection Urinary tract infections (UTIs) can develop anywhere along your urinary tract. Your urinary tract is your body's drainage system for removing wastes and extra water. Your urinary tract includes two kidneys, two ureters, a bladder, and a urethra. Your kidneys are a pair of bean-shaped organs. Each kidney is about the size of your fist. They are located below your ribs, one on each side of your spine. CAUSES Infections are caused by microbes, which are microscopic organisms, including fungi, viruses, and bacteria. These organisms are so small that they can only be seen through a microscope. Bacteria are the microbes that most commonly cause UTIs. SYMPTOMS  Symptoms of UTIs may vary by age and gender of the patient and by the location of the infection. Symptoms in young women typically include a frequent and intense urge to urinate and a painful, burning feeling in the bladder or urethra during urination. Older women and men are more likely to be tired, shaky, and weak and have muscle aches and abdominal pain. A fever may mean the infection is in your kidneys. Other symptoms of a kidney infection include pain in your back or sides below the ribs, nausea, and vomiting. DIAGNOSIS To diagnose a UTI, your caregiver will ask you about your symptoms. Your caregiver also will ask to provide a urine sample. The urine sample will be tested for bacteria and white blood cells. White  blood cells are made by your body to help fight infection. TREATMENT  Typically, UTIs can be treated with medication. Because most UTIs are caused by a bacterial infection, they usually can be treated with the use of antibiotics. The choice of antibiotic and length of treatment depend on your symptoms and the type of bacteria causing your infection. HOME CARE INSTRUCTIONS  If you were prescribed antibiotics, take them exactly as your caregiver instructs you. Finish the medication even if you feel better after you have only taken some of the medication.  Drink enough water and fluids to keep your urine clear or pale yellow.  Avoid caffeine, tea, and carbonated beverages. They tend to irritate your bladder.  Empty your bladder often. Avoid holding urine for long periods of time.  Empty your bladder before and after sexual intercourse.  After a bowel movement, women should cleanse from front to back. Use each tissue only once. SEEK MEDICAL CARE IF:   You have back pain.  You develop a fever.  Your symptoms do not begin to resolve within 3 days. SEEK IMMEDIATE MEDICAL CARE IF:   You have severe back pain or lower abdominal pain.  You develop chills.  You have nausea or vomiting.  You have continued burning or discomfort with urination. MAKE SURE YOU:   Understand these instructions.  Will watch your condition.  Will get help right away if you are not doing well or get worse. Document Released: 04/11/2005 Document Revised: 01/01/2012 Document Reviewed: 08/10/2011  [employer] Patient Information 2015 [employer]. This information is not intended to replace advice given to you by your health care provider. Make sure you discuss any questions you have with your health care provider.  

## 2014-06-30 LAB — HEPATIC FUNCTION PANEL
ALBUMIN: 4.2 g/dL (ref 3.5–5.2)
ALT: 11 U/L (ref 0–35)
AST: 16 U/L (ref 0–37)
Alkaline Phosphatase: 81 U/L (ref 39–117)
Bilirubin, Direct: 0.1 mg/dL (ref 0.0–0.3)
Indirect Bilirubin: 0.5 mg/dL (ref 0.2–1.2)
TOTAL PROTEIN: 6.9 g/dL (ref 6.0–8.3)
Total Bilirubin: 0.6 mg/dL (ref 0.2–1.2)

## 2014-06-30 LAB — BASIC METABOLIC PANEL WITH GFR
BUN: 15 mg/dL (ref 6–23)
CALCIUM: 9.8 mg/dL (ref 8.4–10.5)
CO2: 30 meq/L (ref 19–32)
CREATININE: 0.64 mg/dL (ref 0.50–1.10)
Chloride: 101 mEq/L (ref 96–112)
GFR, Est African American: >89 mL/min
GFR, Est Non African American: >89 mL/min
Glucose, Bld: 98 mg/dL (ref 70–99)
Potassium: 4.4 mEq/L (ref 3.5–5.3)
Sodium: 138 mEq/L (ref 135–145)

## 2014-06-30 LAB — LIPID PANEL
CHOLESTEROL: 251 mg/dL — AB (ref 0–200)
HDL: 76 mg/dL (ref >39)
LDL Cholesterol: 158 mg/dL — ABNORMAL HIGH (ref 0–99)
Total CHOL/HDL Ratio: 3.3 Ratio
Triglycerides: 85 mg/dL (ref <150)
VLDL: 17 mg/dL (ref 0–40)

## 2014-06-30 LAB — MAGNESIUM: Magnesium: 1.8 mg/dL (ref 1.5–2.5)

## 2014-06-30 LAB — URINALYSIS, ROUTINE W REFLEX MICROSCOPIC
Bilirubin Urine: NEGATIVE
Glucose, UA: NEGATIVE mg/dL
Hgb urine dipstick: NEGATIVE
KETONES UR: NEGATIVE mg/dL
Leukocytes, UA: NEGATIVE
Nitrite: NEGATIVE
PH: 7 (ref 5.0–8.0)
Protein, ur: NEGATIVE mg/dL
Specific Gravity, Urine: 1.012 (ref 1.005–1.030)
Urobilinogen, UA: 0.2 mg/dL (ref 0.0–1.0)

## 2014-06-30 LAB — TSH: TSH: 0.639 u[IU]/mL (ref 0.350–4.500)

## 2014-06-30 LAB — HEMOGLOBIN A1C
HEMOGLOBIN A1C: 5.8 % — AB (ref <5.7)
MEAN PLASMA GLUCOSE: 120 mg/dL — AB (ref <117)

## 2014-06-30 LAB — INSULIN, FASTING: Insulin fasting, serum: 2 u[IU]/mL (ref 2.0–19.6)

## 2014-06-30 LAB — VITAMIN D 25 HYDROXY (VIT D DEFICIENCY, FRACTURES): Vit D, 25-Hydroxy: 48 ng/mL (ref 30–100)

## 2014-07-01 LAB — URINE CULTURE: Colony Count: 15000

## 2014-07-26 ENCOUNTER — Encounter: Payer: Self-pay | Admitting: Podiatry

## 2014-07-26 ENCOUNTER — Ambulatory Visit (INDEPENDENT_AMBULATORY_CARE_PROVIDER_SITE_OTHER): Payer: 59 | Admitting: Podiatry

## 2014-07-26 VITALS — BP 158/73 | HR 58 | Resp 16

## 2014-07-26 DIAGNOSIS — B351 Tinea unguium: Secondary | ICD-10-CM

## 2014-07-26 DIAGNOSIS — M79673 Pain in unspecified foot: Secondary | ICD-10-CM

## 2014-07-26 DIAGNOSIS — L600 Ingrowing nail: Secondary | ICD-10-CM

## 2014-07-26 NOTE — Patient Instructions (Signed)

## 2014-07-26 NOTE — Progress Notes (Signed)
Subjective:     Patient ID: [name], female   DOB: [birth year], 64 y.o.   MRN: [id number]  HPI patient presents with a painful ingrown toenail deformity left hallux medial border and nail disease 1-5 both feet that she cannot take care of herself   Review of Systems     Objective:   Physical Exam Neurovascular status intact with incurvated hallux nail left hallux medial border that's painful when pressed and thick yellow brittle nailbeds 1-5 both feet that are painful    Assessment:     Ingrown toenail deformity left hallux medial border that's painful and mycotic nail infection 1-5 of both feet    Plan:     Reviewed both conditions and discussed and explained removal of the nail corner left. Patient wants to have this done understanding the risk associated with this and today I infiltrated 60 mg Xylocaine Marcaine mixture remove the medial border and exposed matrix applying phenol 3 applications 30 seconds followed by alcohol lavaged and then debrided remaining nails with no bleeding noted area instructed on soaks and reappoint

## 2014-07-27 ENCOUNTER — Telehealth: Payer: Self-pay | Admitting: *Deleted

## 2014-07-27 NOTE — Telephone Encounter (Signed)
Pt states she had a toenail removed yesterday and it is painful, the Tylenol is not helping.  Pt also asked how long would she be out of a enclosed shoe, and out of work.  I informed pt she should begin the cleanses and cover with Neosporin bandaid, I encouraged pt to use OTC Ibuprofen as directed on the package, if she had no allergy or other health problem with the medication.  Pt states she had no problem with the Ibuprofen and agreed to begin as instructed.  I told pt to wear a loose fitting shoe and she should be able to work.

## 2014-09-07 ENCOUNTER — Other Ambulatory Visit: Payer: Self-pay | Admitting: Internal Medicine

## 2014-09-21 ENCOUNTER — Encounter: Payer: Self-pay | Admitting: Internal Medicine

## 2014-09-21 ENCOUNTER — Ambulatory Visit (INDEPENDENT_AMBULATORY_CARE_PROVIDER_SITE_OTHER): Payer: 59 | Admitting: Internal Medicine

## 2014-09-21 VITALS — BP 140/72 | HR 56 | Temp 97.7°F | Resp 16 | Ht 65.25 in | Wt 161.7 lb

## 2014-09-21 DIAGNOSIS — R7309 Other abnormal glucose: Secondary | ICD-10-CM

## 2014-09-21 DIAGNOSIS — I10 Essential (primary) hypertension: Secondary | ICD-10-CM

## 2014-09-21 DIAGNOSIS — R5383 Other fatigue: Secondary | ICD-10-CM

## 2014-09-21 DIAGNOSIS — E559 Vitamin D deficiency, unspecified: Secondary | ICD-10-CM

## 2014-09-21 DIAGNOSIS — Z111 Encounter for screening for respiratory tuberculosis: Secondary | ICD-10-CM

## 2014-09-21 DIAGNOSIS — Z1212 Encounter for screening for malignant neoplasm of rectum: Secondary | ICD-10-CM

## 2014-09-21 DIAGNOSIS — E782 Mixed hyperlipidemia: Secondary | ICD-10-CM

## 2014-09-21 DIAGNOSIS — Z79899 Other long term (current) drug therapy: Secondary | ICD-10-CM

## 2014-09-21 DIAGNOSIS — R7303 Prediabetes: Secondary | ICD-10-CM

## 2014-09-21 LAB — CBC WITH DIFFERENTIAL/PLATELET
Basophils Absolute: 0 10*3/uL (ref 0.0–0.1)
Basophils Relative: 0 % (ref 0–1)
EOS PCT: 4 % (ref 0–5)
Eosinophils Absolute: 0.3 10*3/uL (ref 0.0–0.7)
HCT: 40.3 % (ref 36.0–46.0)
Hemoglobin: 13.7 g/dL (ref 12.0–15.0)
Lymphocytes Relative: 43 % (ref 12–46)
Lymphs Abs: 3.2 10*3/uL (ref 0.7–4.0)
MCH: 27.1 pg (ref 26.0–34.0)
MCHC: 34 g/dL (ref 30.0–36.0)
MCV: 79.8 fL (ref 78.0–100.0)
MPV: 10.3 fL (ref 8.6–12.4)
Monocytes Absolute: 0.4 10*3/uL (ref 0.1–1.0)
Monocytes Relative: 6 % (ref 3–12)
Neutro Abs: 3.5 10*3/uL (ref 1.7–7.7)
Neutrophils Relative %: 47 % (ref 43–77)
Platelets: 239 10*3/uL (ref 150–400)
RBC: 5.05 MIL/uL (ref 3.87–5.11)
RDW: 15.7 % — ABNORMAL HIGH (ref 11.5–15.5)
WBC: 7.4 10*3/uL (ref 4.0–10.5)

## 2014-09-21 LAB — HEMOGLOBIN A1C
HEMOGLOBIN A1C: 5.8 % — AB (ref <5.7)
MEAN PLASMA GLUCOSE: 120 mg/dL — AB (ref <117)

## 2014-09-21 NOTE — Progress Notes (Signed)
Patient ID: [name], female   DOB: 11/20/1950, 64 y.o.   MRN: [id number]  Annual Comprehensive Examination  This very nice 64 y.o. married [place] female presents for complete physical.  Patient has been followed for HTN, T2_NIDDM  Prediabetes, Hyperlipidemia, and Vitamin D Deficiency.    HTN predates since 76. Patient's BP has been controlled at home and patient denies any cardiac symptoms as chest pain, palpitations, shortness of breath, dizziness or ankle swelling.  Home BP's are usually in the 120's / 70's. Today's BP: 140/72 mmHg    Patient's hyperlipidemia is controlled with diet and medications. Patient denies myalgias or other medication SE's. Last lipids were not at goal Chol 251; HDL 76; LDL  158*; Triglycerides 85 on 06/29/2014:    Patient is screened for  prediabetes and patient denies reactive hypoglycemic symptoms, visual blurring, diabetic polys, or paresthesias. Last A1c was  5.8% on 06/29/2014.   Finally, patient has history of Vitamin D Deficiency and last Vitamin D was  48 on 06/29/2014.  Medication Sig  . aspirin 81 MG tablet Take 81 mg by mouth daily.  . bisoprolol-hctz (ZIAC) 10-6.25 MG per tablet TAKE 1 TABLET BY MOUTH EVERY MORNING FOR BLOOD PRESSURE  . Cholecalciferol (VITAMIN D) 2000 UNITS tablet Take 2,000 Units by mouth daily.  . clonazePAM (KLONOPIN) 0.5 MG tablet TAKE 1/2-1 TABLET BY MOUTH AT BEDTIME FOR SLEEP  . Cyanocobalamin (B-12 PO) Take by mouth daily.  . cyclobenzaprine (FLEXERIL) 10 MG tablet Take 10 mg by mouth 2 (two) times daily as needed.  . enalapril (VASOTEC) 20 MG tablet TAKE 1 TABLET BY MOUTH EVERY DAY  . Fluticasone Propionate (FLONASE NA) Place into the nose.  . Multiple Vitamins-Minerals   Take 1 capsule by mouth daily.  . Naproxen Sodium (ALEVE PO) Take by mouth.  . Omega-3 Fatty Acids (FISH OIL PO) Take by mouth.  Marland Kitchen omeprazole (PRILOSEC) 40 MG capsule Take 40 mg by mouth daily.   No Known Allergies   Past Medical History   Diagnosis Date  . Hypertension   . Hyperlipidemia   . Anxiety   . Diabetes mellitus without complication     controlled by diet  . Arthritis     osteoarthritis  . PONV (postoperative nausea and vomiting)   . Environmental allergies   . Urgency of urination     Takes oxybutynin; under control at this time  . Allergy     enviromental   Health Maintenance  Topic Date Due  . HIV Screening  09/30/1965  . PAP SMEAR  09/30/1968  . ZOSTAVAX  10/01/2010  . MAMMOGRAM  06/17/2011  . INFLUENZA VACCINE  02/13/2014  . TETANUS/TDAP  02/06/2021  . COLONOSCOPY  02/09/2024   Immunization History  Administered Date(s) Administered  . PPD Test 09/21/2014  . Pneumococcal Polysaccharide-23 02/07/2011  . Tdap 02/07/2011   Past Surgical History  Procedure Laterality Date  . Appendectomy      removed as child  . Partial hysterectomy    . Lumbar fusion  02/06/2012    L 4  L5  . Back surgery      lumbar fusion  . Dilation and curettage of uterus    . Total hip arthroplasty  06/02/2012    Procedure: TOTAL HIP ARTHROPLASTY;  Surgeon: Kerin Salen, MD;  Location: [facility];  Service: Orthopedics;  Laterality: Right;  . Colonoscopy    . Total knee arthroplasty Right 12/15/2012    Procedure: TOTAL KNEE ARTHROPLASTY;  Surgeon: Kerin Salen, MD;  Location: [facility];  Service: Orthopedics;  Laterality: Right;   Family History  Problem Relation Age of Onset  . Diabetes Mother   . Colon cancer Mother   . Hypertension Father   . Colon cancer Sister   . Lung cancer Brother   . Hypertension Brother   . Hypertension Brother   . Esophageal cancer Neg Hx   . Rectal cancer Neg Hx   . Stomach cancer Neg Hx    History  Substance Use Topics  . Smoking status: Current Every Day Smoker -- 0.50 packs/day for 30 years  . Smokeless tobacco: Never Used  . Alcohol Use: Yes     Comment: either a glass of wine a night or 2 beers    ROS Constitutional: Denies fever, chills, weight loss/gain, headaches,  insomnia, fatigue, night sweats, and change in appetite. Eyes: Denies redness, blurred vision, diplopia, discharge, itchy, watery eyes.  ENT: Denies discharge, congestion, post nasal drip, epistaxis, sore throat, earache, hearing loss, dental pain, Tinnitus, Vertigo, Sinus pain, snoring.  Cardio: Denies chest pain, palpitations, irregular heartbeat, syncope, dyspnea, diaphoresis, orthopnea, PND, claudication, edema Respiratory: denies cough, dyspnea, DOE, pleurisy, hoarseness, laryngitis, wheezing.  Gastrointestinal: Denies dysphagia, heartburn, reflux, water brash, pain, cramps, nausea, vomiting, bloating, diarrhea, constipation, hematemesis, melena, hematochezia, jaundice, hemorrhoids Genitourinary: Denies dysuria, frequency, urgency, nocturia, hesitancy, discharge, hematuria, flank pain Breast: Breast lumps, nipple discharge, bleeding.  Musculoskeletal: Denies arthralgia, myalgia, stiffness, Jt. Swelling, pain, limp, and strain/sprain. Denies falls. Skin: Denies puritis, rash, hives, warts, acne, eczema, changing in skin lesion Neuro: No weakness, tremor, incoordination, spasms, paresthesia, pain Psychiatric: Denies confusion, memory loss, sensory loss. Denies Depression. Endocrine: Denies change in weight, skin, hair change, nocturia, and paresthesia, diabetic polys, visual blurring, hyper / hypo glycemic episodes.  Heme/Lymph: No excessive bleeding, bruising, enlarged lymph nodes.  Physical Exam  BP 140/72   Pulse 56  Temp 97.7 F Resp 16  Ht 5' 5.25"   Wt 161 lb 11.2 oz     BMI 26.71  General Appearance: Well nourished and in no apparent distress. Eyes: PERRLA, EOMs, conjunctiva no swelling or erythema, normal fundi and vessels. Sinuses: No frontal/maxillary tenderness ENT/Mouth: EACs patent / TMs  nl. Nares clear without erythema, swelling, mucoid exudates. Oral hygiene is good. No erythema, swelling, or exudate. Tongue normal, non-obstructing. Tonsils not swollen or erythematous.  Hearing normal.  Neck: Supple, thyroid normal. No bruits, nodes or JVD. Respiratory: Respiratory effort normal.  BS equal and clear bilateral without rales, rhonci, wheezing or stridor. Cardio: Heart sounds are normal with regular rate and rhythm and no murmurs, rubs or gallops. Peripheral pulses are normal and equal bilaterally without edema. No aortic or femoral bruits. Chest: symmetric with normal excursions and percussion. Breasts: Symmetric, without lumps, nipple discharge, retractions, or fibrocystic changes.  Abdomen: Flat, soft, with bowl sounds. Nontender, no guarding, rebound, hernias, masses, or organomegaly.  Lymphatics: Non tender without lymphadenopathy.  Genitourinary:  Musculoskeletal: Full ROM all peripheral extremities, joint stability, 5/5 strength, and normal gait. Skin: Warm and dry without rashes, lesions, cyanosis, clubbing or  ecchymosis.  Neuro: Cranial nerves intact, reflexes equal bilaterally. Normal muscle tone, no cerebellar symptoms. Sensation intact.  Pysch: Awake and oriented X 3, normal affect, Insight and Judgment appropriate.   Assessment and Plan  1. Essential hypertension  - TSH - Microalbumin / creatinine urine ratio - EKG 12-Lead - [place], RETROPERITNL ABD,  LTD  2. Mixed hyperlipidemia  - Lipid panel  3. Prediabetes  - Hemoglobin A1c - Insulin, fasting  4.  Vitamin D deficiency  - Vit D  25 hydroxy  5. Other fatigue  - Vitamin B12 - Iron and TIBC  6. Screening for rectal cancer  - POC Hemoccult Bld/Stl   7. Medication management  - CBC with Differential/Platelet - BASIC METABOLIC PANEL WITH GFR - Hepatic function panel - Magnesium - Urine Microscopic  8. Screening examination for pulmonary tuberculosis  - PPD   Continue prudent diet as discussed, weight control, BP monitoring, regular exercise, and medications. Discussed med's effects and SE's. Screening labs and tests as requested with regular follow-up as recommended.

## 2014-09-21 NOTE — Patient Instructions (Signed)
Recommend the book "The END of DIETING" by Dr Excell Seltzer   & the book "The END of DIABETES " by Dr Excell Seltzer  At [email or web address] - get book & Audio CD's      Being diabetic has a  300% increased risk for heart attack, stroke, cancer, and alzheimer- type vascular dementia. It is very important that you work harder with diet by avoiding all foods that are white. Avoid white rice (brown & wild rice is OK), white potatoes (sweetpotatoes in moderation is OK), White bread or wheat bread or anything made out of white flour like bagels, donuts, rolls, buns, biscuits, cakes, pastries, cookies, pizza crust, and pasta (made from white flour & egg whites) - vegetarian pasta or spinach or wheat pasta is OK. Multigrain breads like Arnold's or [employer], or multigrain sandwich thins or flatbreads.  Diet, exercise and weight loss can reverse and cure diabetes in the early stages.  Diet, exercise and weight loss is very important in the control and prevention of complications of diabetes which affects every system in your body, ie. Brain - dementia/stroke, eyes - glaucoma/blindness, heart - heart attack/heart failure, kidneys - dialysis, stomach - gastric paralysis, intestines - malabsorption, nerves - severe painful neuritis, circulation - gangrene & loss of a leg(s), and finally cancer and Alzheimers.    I recommend avoid fried & greasy foods,  sweets/candy, white rice (brown or wild rice or Quinoa is OK), white potatoes (sweet potatoes are OK) - anything made from white flour - bagels, doughnuts, rolls, buns, biscuits,white and wheat breads, pizza crust and traditional pasta made of white flour & egg white(vegetarian pasta or spinach or wheat pasta is OK).  Multi-grain bread is OK - like multi-grain flat bread or sandwich thins. Avoid alcohol in excess. Exercise is also important.    Eat all the vegetables you want - avoid meat, especially red meat and dairy - especially cheese.  Cheese is the most  concentrated form of trans-fats which is the worst thing to clog up our arteries. Veggie cheese is OK which can be found in the fresh produce section at [employer] or [employer] or [employer]  Preventive Care for Adults A healthy lifestyle and preventive care can promote health and wellness. Preventive health guidelines for women include the following key practices.  A routine yearly physical is a good way to check with your health care provider about your health and preventive screening. It is a chance to share any concerns and updates on your health and to receive a thorough exam.  Visit your dentist for a routine exam and preventive care every 6 months. Brush your teeth twice a day and floss once a day. Good oral hygiene prevents tooth decay and gum disease.  The frequency of eye exams is based on your age, health, family medical history, use of contact lenses, and other factors. Follow your health care provider's recommendations for frequency of eye exams.  Eat a healthy diet. Foods like vegetables, fruits, whole grains, low-fat dairy products, and lean protein foods contain the nutrients you need without too many calories. Decrease your intake of foods high in solid fats, added sugars, and salt. Eat the right amount of calories for you.Get information about a proper diet from your health care provider, if necessary.  Regular physical exercise is one of the most important things you can do for your health. Most adults should get at least 150 minutes of moderate-intensity exercise (any activity that increases your heart rate and  causes you to sweat) each week. In addition, most adults need muscle-strengthening exercises on 2 or more days a week.  Maintain a healthy weight. The body mass index (BMI) is a screening tool to identify possible weight problems. It provides an estimate of body fat based on height and weight. Your health care provider can find your BMI and can help you achieve or  maintain a healthy weight.For adults 20 years and older:  A BMI below 18.5 is considered underweight.  A BMI of 18.5 to 24.9 is normal.  A BMI of 25 to 29.9 is considered overweight.  A BMI of 30 and above is considered obese.  Maintain normal blood lipids and cholesterol levels by exercising and minimizing your intake of saturated fat. Eat a balanced diet with plenty of fruit and vegetables. Blood tests for lipids and cholesterol should begin at age 57 and be repeated every 5 years. If your lipid or cholesterol levels are high, you are over 50, or you are at high risk for heart disease, you may need your cholesterol levels checked more frequently.Ongoing high lipid and cholesterol levels should be treated with medicines if diet and exercise are not working.  If you smoke, find out from your health care provider how to quit. If you do not use tobacco, do not start.  Lung cancer screening is recommended for adults aged 52-80 years who are at high risk for developing lung cancer because of a history of smoking. A yearly low-dose CT scan of the lungs is recommended for people who have at least a 30-pack-year history of smoking and are a current smoker or have quit within the past 15 years. A pack year of smoking is smoking an average of 1 pack of cigarettes a day for 1 year (for example: 1 pack a day for 30 years or 2 packs a day for 15 years). Yearly screening should continue until the smoker has stopped smoking for at least 15 years. Yearly screening should be stopped for people who develop a health problem that would prevent them from having lung cancer treatment.  High blood pressure causes heart disease and increases the risk of stroke. Your blood pressure should be checked at least every 1 to 2 years. Ongoing high blood pressure should be treated with medicines if weight loss and exercise do not work.  If you are 43-25 years old, ask your health care provider if you should take aspirin to  prevent strokes.  Diabetes screening involves taking a blood sample to check your fasting blood sugar level. This should be done once every 3 years, after age 36, if you are within normal weight and without risk factors for diabetes. Testing should be considered at a younger age or be carried out more frequently if you are overweight and have at least 1 risk factor for diabetes.  Breast cancer screening is essential preventive care for women. You should practice "breast self-awareness." This means understanding the normal appearance and feel of your breasts and may include breast self-examination. Any changes detected, no matter how small, should be reported to a health care provider. Women in their 73s and 30s should have a clinical breast exam (CBE) by a health care provider as part of a regular health exam every 1 to 3 years. After age 12, women should have a CBE every year. Starting at age 75, women should consider having a mammogram (breast X-ray test) every year. Women who have a family history of breast cancer should talk to  their health care provider about genetic screening. Women at a high risk of breast cancer should talk to their health care providers about having an MRI and a mammogram every year.  Breast cancer gene (BRCA)-related cancer risk assessment is recommended for women who have family members with BRCA-related cancers. BRCA-related cancers include breast, ovarian, tubal, and peritoneal cancers. Having family members with these cancers may be associated with an increased risk for harmful changes (mutations) in the breast cancer genes BRCA1 and BRCA2. Results of the assessment will determine the need for genetic counseling and BRCA1 and BRCA2 testing.  Routine pelvic exams to screen for cancer are no longer recommended for nonpregnant women who are considered low risk for cancer of the pelvic organs (ovaries, uterus, and vagina) and who do not have symptoms. Ask your health care provider  if a screening pelvic exam is right for you.  If you have had past treatment for cervical cancer or a condition that could lead to cancer, you need Pap tests and screening for cancer for at least 20 years after your treatment. If Pap tests have been discontinued, your risk factors (such as having a new sexual partner) need to be reassessed to determine if screening should be resumed. Some women have medical problems that increase the chance of getting cervical cancer. In these cases, your health care provider may recommend more frequent screening and Pap tests.  Colorectal cancer can be detected and often prevented. Most routine colorectal cancer screening begins at the age of 63 years and continues through age 41 years. However, your health care provider may recommend screening at an earlier age if you have risk factors for colon cancer. On a yearly basis, your health care provider may provide home test kits to check for hidden blood in the stool. Use of a small camera at the end of a tube, to directly examine the colon (sigmoidoscopy or colonoscopy), can detect the earliest forms of colorectal cancer. Talk to your health care provider about this at age 57, when routine screening begins. Direct exam of the colon should be repeated every 5-10 years through age 65 years, unless early forms of pre-cancerous polyps or small growths are found.  Hepatitis C blood testing is recommended for all people born from [birth year] through 1965 and any individual with known risks for hepatitis C.  Pra  Osteoporosis is a disease in which the bones lose minerals and strength with aging. This can result in serious bone fractures or breaks. The risk of osteoporosis can be identified using a bone density scan. Women ages 78 years and over and women at risk for fractures or osteoporosis should discuss screening with their health care providers. Ask your health care provider whether you should take a calcium supplement or vitamin D  to reduce the rate of osteoporosis.  Menopause can be associated with physical symptoms and risks. Hormone replacement therapy is available to decrease symptoms and risks. You should talk to your health care provider about whether hormone replacement therapy is right for you.  Use sunscreen. Apply sunscreen liberally and repeatedly throughout the day. You should seek shade when your shadow is shorter than you. Protect yourself by wearing long sleeves, pants, a wide-brimmed hat, and sunglasses year round, whenever you are outdoors.  Once a month, do a whole body skin exam, using a mirror to look at the skin on your back. Tell your health care provider of new moles, moles that have irregular borders, moles that are larger than a pencil  eraser, or moles that have changed in shape or color.  Stay current with required vaccines (immunizations).  Influenza vaccine. All adults should be immunized every year.  Tetanus, diphtheria, and acellular pertussis (Td, Tdap) vaccine. Pregnant women should receive 1 dose of Tdap vaccine during each pregnancy. The dose should be obtained regardless of the length of time since the last dose. Immunization is preferred during the 27th-36th week of gestation. An adult who has not previously received Tdap or who does not know her vaccine status should receive 1 dose of Tdap. This initial dose should be followed by tetanus and diphtheria toxoids (Td) booster doses every 10 years. Adults with an unknown or incomplete history of completing a 3-dose immunization series with Td-containing vaccines should begin or complete a primary immunization series including a Tdap dose. Adults should receive a Td booster every 10 years.  Varicella vaccine. An adult without evidence of immunity to varicella should receive 2 doses or a second dose if she has previously received 1 dose. Pregnant females who do not have evidence of immunity should receive the first dose after pregnancy. This first  dose should be obtained before leaving the health care facility. The second dose should be obtained 4-8 weeks after the first dose.  Human papillomavirus (HPV) vaccine. Females aged 13-26 years who have not received the vaccine previously should obtain the 3-dose series. The vaccine is not recommended for use in pregnant females. However, pregnancy testing is not needed before receiving a dose. If a female is found to be pregnant after receiving a dose, no treatment is needed. In that case, the remaining doses should be delayed until after the pregnancy. Immunization is recommended for any person with an immunocompromised condition through the age of 24 years if she did not get any or all doses earlier. During the 3-dose series, the second dose should be obtained 4-8 weeks after the first dose. The third dose should be obtained 24 weeks after the first dose and 16 weeks after the second dose.  Zoster vaccine. One dose is recommended for adults aged 52 years or older unless certain conditions are present.  Measles, mumps, and rubella (MMR) vaccine. Adults born before [birth year] generally are considered immune to measles and mumps. Adults born in [birth year] or later should have 1 or more doses of MMR vaccine unless there is a contraindication to the vaccine or there is laboratory evidence of immunity to each of the three diseases. A routine second dose of MMR vaccine should be obtained at least 28 days after the first dose for students attending postsecondary schools, health care workers, or international travelers. People who received inactivated measles vaccine or an unknown type of measles vaccine during 1963-1967 should receive 2 doses of MMR vaccine. People who received inactivated mumps vaccine or an unknown type of mumps vaccine before 1979 and are at high risk for mumps infection should consider immunization with 2 doses of MMR vaccine. For females of childbearing age, rubella immunity should be determined. If there  is no evidence of immunity, females who are not pregnant should be vaccinated. If there is no evidence of immunity, females who are pregnant should delay immunization until after pregnancy. Unvaccinated health care workers born before [birth year] who lack laboratory evidence of measles, mumps, or rubella immunity or laboratory confirmation of disease should consider measles and mumps immunization with 2 doses of MMR vaccine or rubella immunization with 1 dose of MMR vaccine.  Pneumococcal 13-valent conjugate (PCV13) vaccine. When indicated, a person who  is uncertain of her immunization history and has no record of immunization should receive the PCV13 vaccine. An adult aged 68 years or older who has certain medical conditions and has not been previously immunized should receive 1 dose of PCV13 vaccine. This PCV13 should be followed with a dose of pneumococcal polysaccharide (PPSV23) vaccine. The PPSV23 vaccine dose should be obtained at least 8 weeks after the dose of PCV13 vaccine. An adult aged 60 years or older who has certain medical conditions and previously received 1 or more doses of PPSV23 vaccine should receive 1 dose of PCV13. The PCV13 vaccine dose should be obtained 1 or more years after the last PPSV23 vaccine dose.    Pneumococcal polysaccharide (PPSV23) vaccine. When PCV13 is also indicated, PCV13 should be obtained first. All adults aged 42 years and older should be immunized. An adult younger than age 80 years who has certain medical conditions should be immunized. Any person who resides in a nursing home or long-term care facility should be immunized. An adult smoker should be immunized. People with an immunocompromised condition and certain other conditions should receive both PCV13 and PPSV23 vaccines. People with human immunodeficiency virus (HIV) infection should be immunized as soon as possible after diagnosis. Immunization during chemotherapy or radiation therapy should be avoided. Routine use  of PPSV23 vaccine is not recommended for American Indians, [place] Natives, or people younger than 65 years unless there are medical conditions that require PPSV23 vaccine. When indicated, people who have unknown immunization and have no record of immunization should receive PPSV23 vaccine. One-time revaccination 5 years after the first dose of PPSV23 is recommended for people aged 19-64 years who have chronic kidney failure, nephrotic syndrome, asplenia, or immunocompromised conditions. People who received 1-2 doses of PPSV23 before age 70 years should receive another dose of PPSV23 vaccine at age 68 years or later if at least 5 years have passed since the previous dose. Doses of PPSV23 are not needed for people immunized with PPSV23 at or after age 38 years.  Preventive Services / Frequency   Ages 9 to 79 years  Blood pressure check.  Lipid and cholesterol check.  Lung cancer screening. / Every year if you are aged 51-80 years and have a 30-pack-year history of smoking and currently smoke or have quit within the past 15 years. Yearly screening is stopped once you have quit smoking for at least 15 years or develop a health problem that would prevent you from having lung cancer treatment.  Clinical breast exam.** / Every year after age 32 years.  BRCA-related cancer risk assessment.** / For women who have family members with a BRCA-related cancer (breast, ovarian, tubal, or peritoneal cancers).  Mammogram.** / Every year beginning at age 21 years and continuing for as long as you are in good health. Consult with your health care provider.  Pap test.** / Every 3 years starting at age 56 years through age 72 or 3 years with a history of 3 consecutive normal Pap tests.  HPV screening.** / Every 3 years from ages 39 years through ages 22 to 61 years with a history of 3 consecutive normal Pap tests.  Fecal occult blood test (FOBT) of stool. / Every year beginning at age 18 years and continuing  until age 70 years. You may not need to do this test if you get a colonoscopy every 10 years.  Flexible sigmoidoscopy or colonoscopy.** / Every 5 years for a flexible sigmoidoscopy or every 10 years for a colonoscopy beginning  at age 78 years and continuing until age 65 years.  Hepatitis C blood test.** / For all people born from [birth year] through 1965 and any individual with known risks for hepatitis C.  Skin self-exam. / Monthly.  Influenza vaccine. / Every year.  Tetanus, diphtheria, and acellular pertussis (Tdap/Td) vaccine.** / Consult your health care provider. Pregnant women should receive 1 dose of Tdap vaccine during each pregnancy. 1 dose of Td every 10 years.  Varicella vaccine.** / Consult your health care provider. Pregnant females who do not have evidence of immunity should receive the first dose after pregnancy.  Zoster vaccine.** / 1 dose for adults aged 64 years or older.  Pneumococcal 13-valent conjugate (PCV13) vaccine.** / Consult your health care provider.  Pneumococcal polysaccharide (PPSV23) vaccine.** / 1 to 2 doses if you smoke cigarettes or if you have certain conditions.  Meningococcal vaccine.** / Consult your health care provider.  Hepatitis A vaccine.** / Consult your health care provider.  Hepatitis B vaccine.** / Consult your health care provider. Screening for abdominal aortic aneurysm (AAA)  by ultrasound is recommended for people over 50 who have history of high blood pressure or who are current or former smokers.

## 2014-09-22 LAB — HEPATIC FUNCTION PANEL
ALK PHOS: 72 U/L (ref 39–117)
ALT: 10 U/L (ref 0–35)
AST: 18 U/L (ref 0–37)
Albumin: 4.2 g/dL (ref 3.5–5.2)
BILIRUBIN DIRECT: 0.1 mg/dL (ref 0.0–0.3)
Indirect Bilirubin: 0.5 mg/dL (ref 0.2–1.2)
Total Bilirubin: 0.6 mg/dL (ref 0.2–1.2)
Total Protein: 6.6 g/dL (ref 6.0–8.3)

## 2014-09-22 LAB — BASIC METABOLIC PANEL WITH GFR
BUN: 15 mg/dL (ref 6–23)
CO2: 30 mEq/L (ref 19–32)
Calcium: 9.3 mg/dL (ref 8.4–10.5)
Chloride: 102 mEq/L (ref 96–112)
Creat: 0.71 mg/dL (ref 0.50–1.10)
GFR, Est Non African American: >89 mL/min
GLUCOSE: 79 mg/dL (ref 70–99)
POTASSIUM: 4.2 meq/L (ref 3.5–5.3)
SODIUM: 139 meq/L (ref 135–145)

## 2014-09-22 LAB — INSULIN, FASTING: Insulin fasting, serum: 2.3 u[IU]/mL (ref 2.0–19.6)

## 2014-09-22 LAB — MAGNESIUM: Magnesium: 1.7 mg/dL (ref 1.5–2.5)

## 2014-09-22 LAB — LIPID PANEL
CHOL/HDL RATIO: 3.8 ratio
CHOLESTEROL: 237 mg/dL — AB (ref 0–200)
HDL: 62 mg/dL (ref >=46)
LDL Cholesterol: 148 mg/dL — ABNORMAL HIGH (ref 0–99)
Triglycerides: 133 mg/dL (ref <150)
VLDL: 27 mg/dL (ref 0–40)

## 2014-09-22 LAB — IRON AND TIBC
%SAT: 27 % (ref 20–55)
Iron: 96 ug/dL (ref 42–145)
TIBC: 359 ug/dL (ref 250–470)
UIBC: 263 ug/dL (ref 125–400)

## 2014-09-22 LAB — MICROALBUMIN / CREATININE URINE RATIO
Creatinine, Urine: 72.5 mg/dL
MICROALB UR: 0.7 mg/dL (ref <2.0)
Microalb Creat Ratio: 9.7 mg/g (ref 0.0–30.0)

## 2014-09-22 LAB — URINALYSIS, MICROSCOPIC ONLY
Bacteria, UA: NONE SEEN
Casts: NONE SEEN
Crystals: NONE SEEN
Squamous Epithelial / LPF: NONE SEEN

## 2014-09-22 LAB — TSH: TSH: 0.538 u[IU]/mL (ref 0.350–4.500)

## 2014-09-22 LAB — VITAMIN D 25 HYDROXY (VIT D DEFICIENCY, FRACTURES): VIT D 25 HYDROXY: 54 ng/mL (ref 30–100)

## 2014-09-22 LAB — VITAMIN B12: VITAMIN B 12: 928 pg/mL — AB (ref 211–911)

## 2014-09-26 ENCOUNTER — Other Ambulatory Visit: Payer: Self-pay | Admitting: Internal Medicine

## 2014-09-26 MED ORDER — ATORVASTATIN CALCIUM 80 MG PO TABS: ORAL_TABLET | ORAL | Status: DC

## 2014-10-06 ENCOUNTER — Ambulatory Visit (INDEPENDENT_AMBULATORY_CARE_PROVIDER_SITE_OTHER): Payer: 59 | Admitting: Internal Medicine

## 2014-10-06 ENCOUNTER — Encounter: Payer: Self-pay | Admitting: Internal Medicine

## 2014-10-06 ENCOUNTER — Other Ambulatory Visit: Payer: Self-pay | Admitting: Internal Medicine

## 2014-10-06 VITALS — BP 140/68 | HR 58 | Temp 98.0°F | Resp 16 | Ht 65.25 in | Wt 165.0 lb

## 2014-10-06 DIAGNOSIS — M25552 Pain in left hip: Secondary | ICD-10-CM

## 2014-10-06 MED ORDER — MELOXICAM 15 MG PO TABS: 15.0000 mg | ORAL_TABLET | Freq: Every day | ORAL | Status: DC

## 2014-10-06 MED ORDER — TRAMADOL HCL 50 MG PO TABS: 50.0000 mg | ORAL_TABLET | Freq: Four times a day (QID) | ORAL | Status: DC | PRN

## 2014-10-06 NOTE — Patient Instructions (Signed)
Hip Pain Your hip is the joint between your upper legs and your lower pelvis. The bones, cartilage, tendons, and muscles of your hip joint perform a lot of work each day supporting your body weight and allowing you to move around. Hip pain can range from a minor ache to severe pain in one or both of your hips. Pain may be felt on the inside of the hip joint near the groin, or the outside near the buttocks and upper thigh. You may have swelling or stiffness as well.  HOME CARE INSTRUCTIONS   Take medicines only as directed by your health care provider.  Apply ice to the injured area:  Put ice in a plastic bag.  Place a towel between your skin and the bag.  Leave the ice on for 15-20 minutes at a time, 3-4 times a day.  Keep your leg raised (elevated) when possible to lessen swelling.  Avoid activities that cause pain.  Follow specific exercises as directed by your health care provider.  Sleep with a pillow between your legs on your most comfortable side.  Record how often you have hip pain, the location of the pain, and what it feels like. SEEK MEDICAL CARE IF:   You are unable to put weight on your leg.  Your hip is red or swollen or very tender to touch.  Your pain or swelling continues or worsens after 1 week.  You have increasing difficulty walking.  You have a fever. SEEK IMMEDIATE MEDICAL CARE IF:   You have fallen.  You have a sudden increase in pain and swelling in your hip. MAKE SURE YOU:   Understand these instructions.  Will watch your condition.  Will get help right away if you are not doing well or get worse. Document Released: 12/20/2009 Document Revised: 11/16/2013 Document Reviewed: 02/26/2013 [employer] Patient Information 2015 [employer]. This information is not intended to replace advice given to you by your health care provider. Make sure you discuss any questions you have with your health care provider.  

## 2014-10-06 NOTE — Progress Notes (Signed)
   Subjective:    Patient ID: [name], female    DOB: [birth year], 64 y.o.   MRN: [id number]  Hip Pain  Pertinent negatives include no numbness.   Patient presents to the office for evaluation of left hip pain.  She reports that she intermittently experiences some hip pain but for the past 2 days the pain has been constant and is located more in her anterior groin.  Her pain is a 10/10 pain.  She does have hip replacement currently on the right and the right knee.  She had a Psychologist, sport and exercise from [place] who did her knee and her hip.  She has been taking aleve at home with little relief.  Her pain almost brought her to tears yesterday.   Review of Systems  Constitutional: Negative for fever, chills and fatigue.  Gastrointestinal: Negative for nausea and vomiting.  Musculoskeletal: Positive for joint swelling, arthralgias and gait problem.  Skin: Negative for color change and rash.  Neurological: Negative for numbness.       Objective:   Physical Exam  Constitutional: She is oriented to person, place, and time. She appears well-developed and well-nourished. No distress.  HENT:  Head: Normocephalic and atraumatic.  Mouth/Throat: Oropharynx is clear and moist. No oropharyngeal exudate.  Eyes: Conjunctivae and EOM are normal. Pupils are equal, round, and reactive to light. No scleral icterus.  Neck: Normal range of motion. Neck supple. No JVD present. No thyromegaly present.  Cardiovascular: Normal rate, regular rhythm, normal heart sounds and intact distal pulses.  Exam reveals no gallop and no friction rub.   No murmur heard. Pulmonary/Chest: Effort normal and breath sounds normal. No respiratory distress. She has no wheezes. She has no rales. She exhibits no tenderness.  Abdominal: Soft. Bowel sounds are normal.  Musculoskeletal:       Left hip: She exhibits decreased range of motion, tenderness and bony tenderness. She exhibits normal strength, no swelling, no crepitus, no  deformity and no laceration.  Irritability with internal and external rotation.  No TTP over the greater trochanteric bursa.  Normal ROM in all fields.  5/5 strength with flexion, extension, circumduction, abduction and adduction.    Lymphadenopathy:    She has no cervical adenopathy.  Neurological: She is alert and oriented to person, place, and time.  Skin: Skin is warm and dry. She is not diaphoretic.  Psychiatric: She has a normal mood and affect. Her behavior is normal. Judgment and thought content normal.  Nursing note and vitals reviewed.         Assessment & Plan:    1. Left hip pain Exam consistent with arthritis.  Patient requesting to be placed on endotalac and avoid steroids.  Do not feel imaging warranted at this time as she will likely get this done by ortho.  Kidney function reviewed and is normal.  Will start on Mobic 15 mg daily instead of endotalac as this is only a once daily medication.  Will give ultram for severe pain.  If no better in 2 weeks will refer to ortho.

## 2014-11-09 LAB — TB SKIN TEST
INDURATION: 0 mm
TB Skin Test: NEGATIVE

## 2014-12-30 ENCOUNTER — Other Ambulatory Visit: Payer: Self-pay | Admitting: Gynecology

## 2014-12-31 LAB — CYTOLOGY - PAP

## 2015-02-23 ENCOUNTER — Other Ambulatory Visit: Payer: Self-pay | Admitting: Internal Medicine

## 2015-03-22 ENCOUNTER — Other Ambulatory Visit: Payer: Self-pay | Admitting: Internal Medicine

## 2015-04-29 ENCOUNTER — Other Ambulatory Visit: Payer: Self-pay | Admitting: *Deleted

## 2015-04-29 DIAGNOSIS — Z1212 Encounter for screening for malignant neoplasm of rectum: Secondary | ICD-10-CM

## 2015-04-29 LAB — POC HEMOCCULT BLD/STL (HOME/3-CARD/SCREEN)
FECAL OCCULT BLD: NEGATIVE
FECAL OCCULT BLD: NEGATIVE
FECAL OCCULT BLD: NEGATIVE

## 2015-05-06 ENCOUNTER — Other Ambulatory Visit: Payer: Self-pay | Admitting: Internal Medicine

## 2015-05-06 ENCOUNTER — Encounter: Payer: Self-pay | Admitting: Internal Medicine

## 2015-05-06 ENCOUNTER — Ambulatory Visit (INDEPENDENT_AMBULATORY_CARE_PROVIDER_SITE_OTHER): Payer: 59 | Admitting: Internal Medicine

## 2015-05-06 VITALS — BP 142/80 | HR 56 | Temp 98.5°F | Resp 16 | Ht 65.25 in | Wt 161.0 lb

## 2015-05-06 DIAGNOSIS — E559 Vitamin D deficiency, unspecified: Secondary | ICD-10-CM

## 2015-05-06 DIAGNOSIS — Z79899 Other long term (current) drug therapy: Secondary | ICD-10-CM | POA: Diagnosis not present

## 2015-05-06 DIAGNOSIS — R7303 Prediabetes: Secondary | ICD-10-CM | POA: Diagnosis not present

## 2015-05-06 DIAGNOSIS — I10 Essential (primary) hypertension: Secondary | ICD-10-CM

## 2015-05-06 DIAGNOSIS — E782 Mixed hyperlipidemia: Secondary | ICD-10-CM | POA: Diagnosis not present

## 2015-05-06 MED ORDER — MONTELUKAST SODIUM 10 MG PO TABS
10.0000 mg | ORAL_TABLET | Freq: Every day | ORAL | Status: DC
Start: 2015-05-06 — End: 2015-12-15

## 2015-05-06 MED ORDER — TRAMADOL HCL 50 MG PO TABS: 50.0000 mg | ORAL_TABLET | Freq: Four times a day (QID) | ORAL | Status: DC | PRN

## 2015-05-06 NOTE — Progress Notes (Signed)
Patient ID: [name], female   DOB: May 23, 1951, 64 y.o.   MRN: [id number]  Assessment and Plan:  Hypertension:  -Continue medication,  -monitor blood pressure at home.  -Continue DASH diet.   -Reminder to go to the ER if any CP, SOB, nausea, dizziness, severe HA, changes vision/speech, left arm numbness and tingling, and jaw pain.  Cholesterol: -Continue diet and exercise.  -Check cholesterol.   Pre-diabetes: -Continue diet and exercise.  -Check A1C  Vitamin D Def: -check level -continue medications.   Continue diet and meds as discussed. Further disposition pending results of labs.  HPI 64 y.o. female  presents for 3 month follow up with hypertension, hyperlipidemia, prediabetes and vitamin D.   Her blood pressure has been controlled at home, today their BP is BP: (!) 142/80 mmHg.   She does workout. She denies chest pain, shortness of breath, dizziness.   She is not on cholesterol medication and denies myalgias. Her cholesterol is not at goal. The cholesterol last visit was:   Lab Results  Component Value Date   CHOL 237* 09/21/2014   HDL 62 09/21/2014   LDLCALC 148* 09/21/2014   TRIG 133 09/21/2014   CHOLHDL 3.8 09/21/2014  She has tried three different statins and cannot tolerate them secondary to muscle cramps.  She would like to try doing diet and exercise if no better than she will have to start zetia or welchol.     She has been working on diet and exercise for prediabetes, and denies foot ulcerations, hyperglycemia, hypoglycemia , increased appetite, nausea, paresthesia of the feet, polydipsia, polyuria, visual disturbances, vomiting and weight loss. Last A1C in the office was:  Lab Results  Component Value Date   HGBA1C 5.8* 09/21/2014    Patient is on Vitamin D supplement.  Lab Results  Component Value Date   VD25OH 40 09/21/2014      She is eating much more high fiber foods and less sugars.  She also reports that she is cutting out fats too.     Current Medications:  Current Outpatient Prescriptions on File Prior to Visit  Medication Sig Dispense Refill  . albuterol (PROVENTIL HFA;VENTOLIN HFA) 108 (90 BASE) MCG/ACT inhaler Inhale 2 puffs into the lungs every 6 (six) hours as needed. For shortness of breath    . aspirin 81 MG tablet Take 81 mg by mouth daily.    Marland Kitchen atorvastatin (LIPITOR) 80 MG tablet Take 1/2 to 1 tablet daily or as directed for cholesterol 30 tablet 11  . bisoprolol-hydrochlorothiazide (ZIAC) 10-6.25 MG per tablet TAKE 1 TABLET BY MOUTH EVERY MORNING FOR BLOOD PRESSURE 90 tablet PRN  . Cholecalciferol (VITAMIN D) 2000 UNITS tablet Take 2,000 Units by mouth daily.    . clonazePAM (KLONOPIN) 0.5 MG tablet TAKE 1/2-1 TABLET BY MOUTH AT BEDTIME FOR SLEEP 30 tablet 0  . Cyanocobalamin (B-12 PO) Take by mouth daily.    . cyclobenzaprine (FLEXERIL) 10 MG tablet Take 10 mg by mouth 2 (two) times daily as needed.    . enalapril (VASOTEC) 20 MG tablet TAKE 1 TABLET BY MOUTH EVERY DAY 90 tablet 2  . Fluticasone Propionate (FLONASE NA) Place into the nose.    . meloxicam (MOBIC) 15 MG tablet TAKE 1 TABLET BY MOUTH DAILY 90 tablet 2  . Multiple Vitamins-Minerals (MULTI COMPLETE PO) Take 1 capsule by mouth daily.    . Naproxen Sodium (ALEVE PO) Take by mouth.    . Omega-3 Fatty Acids (FISH OIL PO) Take by mouth.    Marland Kitchen  omeprazole (PRILOSEC) 40 MG capsule Take 40 mg by mouth daily.    . traMADol (ULTRAM) 50 MG tablet TAKE 1 TABLET BY MOUTH EVERY 6 HOURS AS NEEDED 30 tablet 0   No current facility-administered medications on file prior to visit.    Medical History:  Past Medical History  Diagnosis Date  . Hypertension   . Hyperlipidemia   . Anxiety   . Diabetes mellitus without complication ([place])     controlled by diet  . Arthritis     osteoarthritis  . PONV (postoperative nausea and vomiting)   . Environmental allergies   . Urgency of urination     Takes oxybutynin; under control at this time  . Allergy      enviromental    Allergies: No Known Allergies   Review of Systems:  Review of Systems  Constitutional: Negative for fever, chills and malaise/fatigue.  HENT: Negative for congestion, ear pain and sore throat.   Eyes: Negative.   Respiratory: Negative for cough, shortness of breath and wheezing.   Cardiovascular: Negative for chest pain, palpitations and leg swelling.  Gastrointestinal: Negative for heartburn, diarrhea, constipation, blood in stool and melena.  Genitourinary: Positive for frequency. Negative for dysuria, urgency and hematuria.  Skin: Negative.   Neurological: Negative for dizziness, sensory change, loss of consciousness and headaches.  Psychiatric/Behavioral: Negative for depression. The patient is not nervous/anxious and does not have insomnia.     Family history- Review and unchanged  Social history- Review and unchanged  Physical Exam: BP 142/80 mmHg  Pulse 56  Temp(Src) 98.5 F (36.9 C)  Resp 16  Ht 5' 5.25" (1.657 m)  Wt 161 lb (73.029 kg)  BMI 26.60 kg/m2 Wt Readings from Last 3 Encounters:  05/06/15 161 lb (73.029 kg)  10/06/14 165 lb (74.844 kg)  09/21/14 161 lb 11.2 oz (73.347 kg)    General Appearance: Well nourished well developed, in no apparent distress. Eyes: PERRLA, EOMs, conjunctiva no swelling or erythema ENT/Mouth: Ear canals normal without obstruction, swelling, erythma, discharge.  TMs normal bilaterally.  Oropharynx moist, clear, without exudate, or postoropharyngeal swelling. Neck: Supple, thyroid normal,no cervical adenopathy  Respiratory: Respiratory effort normal, Breath sounds clear A&P without rhonchi, wheeze, or rale.  No retractions, no accessory usage. Cardio: RRR with no MRGs. Brisk peripheral pulses without edema.  Abdomen: Soft, + BS,  Non tender, no guarding, rebound, hernias, masses. Musculoskeletal: Full ROM, 5/5 strength, Normal gait Skin: Warm, dry without rashes, lesions, ecchymosis.  Neuro: Awake and oriented X 3,  Cranial nerves intact. Normal muscle tone, no cerebellar symptoms. Psych: Normal affect, Insight and Judgment appropriate.    Starlyn Skeans, PA-C 10:54 AM [facility]

## 2015-05-09 ENCOUNTER — Ambulatory Visit: Payer: Self-pay | Admitting: Internal Medicine

## 2015-06-30 ENCOUNTER — Ambulatory Visit (INDEPENDENT_AMBULATORY_CARE_PROVIDER_SITE_OTHER): Payer: 59 | Admitting: Internal Medicine

## 2015-06-30 ENCOUNTER — Encounter: Payer: Self-pay | Admitting: Internal Medicine

## 2015-06-30 VITALS — BP 166/92 | HR 74 | Temp 98.2°F | Resp 16 | Ht 65.25 in | Wt 157.0 lb

## 2015-06-30 DIAGNOSIS — R438 Other disturbances of smell and taste: Secondary | ICD-10-CM | POA: Diagnosis not present

## 2015-06-30 DIAGNOSIS — J320 Chronic maxillary sinusitis: Secondary | ICD-10-CM

## 2015-06-30 MED ORDER — AMOXICILLIN-POT CLAVULANATE 875-125 MG PO TABS: 1.0000 | ORAL_TABLET | Freq: Two times a day (BID) | ORAL | Status: DC

## 2015-06-30 NOTE — Patient Instructions (Addendum)
Use saline gel in your nose to help moisturize your nose.  Please rinse sinuses out with nasal saline  Please use augmentin and take it until it is all the way gone.    Please keep an eye on blood pressure.  IF it is consistently greater than 160/90 call office.

## 2015-06-30 NOTE — Progress Notes (Signed)
   Subjective:    Patient ID: [name], female    DOB: 1950-12-25, 64 y.o.   MRN: [id number]  HPI  Patient reports that for the past month she has been having a lot of dental work done.  She reports that she has been tasting blood for the past month.  She reports that she has been using her sinus medication and she has gone to see her Dentist and got a clean bill of dental health yesterday.  She reports that she hasn't changed any medications recently.    She reports BP has been running high at home.  Around 150/70+ at home.  She has been taking both the enalapril and the ziac.       Review of Systems  Constitutional: Negative for fever, chills, diaphoresis and unexpected weight change.  HENT: Positive for congestion, facial swelling and postnasal drip. Negative for dental problem, mouth sores, nosebleeds and sinus pressure.   Respiratory: Negative for cough, choking, chest tightness, shortness of breath and wheezing.   Gastrointestinal: Negative for nausea and vomiting.       Objective:   Physical Exam  Constitutional: She is oriented to person, place, and time. She appears well-developed and well-nourished. No distress.  HENT:  Head: Normocephalic.  Mouth/Throat: Oropharynx is clear and moist. No oropharyngeal exudate.  Eyes: Conjunctivae are normal. No scleral icterus.  Neck: Normal range of motion. Neck supple. No JVD present. No thyromegaly present.  Cardiovascular: Normal rate, regular rhythm, normal heart sounds and intact distal pulses.  Exam reveals no gallop and no friction rub.   No murmur heard. Pulmonary/Chest: Effort normal and breath sounds normal. No respiratory distress. She has no wheezes. She has no rales. She exhibits no tenderness.  Abdominal: Soft. Bowel sounds are normal. She exhibits no distension and no mass. There is no tenderness. There is no rebound and no guarding.  Musculoskeletal: Normal range of motion.  Lymphadenopathy:    She has no cervical  adenopathy.  Neurological: She is alert and oriented to person, place, and time.  Skin: Skin is warm and dry. She is not diaphoretic.  Psychiatric: She has a normal mood and affect. Her behavior is normal. Judgment and thought content normal.  Nursing note and vitals reviewed.   Filed Vitals:   06/30/15 1000  BP: 166/92  Pulse: 74  Temp: 98.2 F (36.8 C)  Resp: 16         Assessment & Plan:    1. Metallic taste -previous nose bleed -may be due to old blood drainage from dry sinuses -nasal saline -saline gel  2. Chronic maxillary sinusitis -augmentin -will cover for dental infection too

## 2015-08-09 ENCOUNTER — Encounter: Payer: Self-pay | Admitting: Internal Medicine

## 2015-08-09 ENCOUNTER — Ambulatory Visit (INDEPENDENT_AMBULATORY_CARE_PROVIDER_SITE_OTHER): Payer: 59 | Admitting: Internal Medicine

## 2015-08-09 VITALS — BP 124/66 | HR 60 | Temp 97.6°F | Resp 16 | Ht 65.25 in | Wt 158.4 lb

## 2015-08-09 DIAGNOSIS — I10 Essential (primary) hypertension: Secondary | ICD-10-CM | POA: Diagnosis not present

## 2015-08-09 DIAGNOSIS — E559 Vitamin D deficiency, unspecified: Secondary | ICD-10-CM

## 2015-08-09 DIAGNOSIS — R945 Abnormal results of liver function studies: Secondary | ICD-10-CM

## 2015-08-09 DIAGNOSIS — Z79899 Other long term (current) drug therapy: Secondary | ICD-10-CM

## 2015-08-09 DIAGNOSIS — R7989 Other specified abnormal findings of blood chemistry: Secondary | ICD-10-CM | POA: Diagnosis not present

## 2015-08-09 DIAGNOSIS — E782 Mixed hyperlipidemia: Secondary | ICD-10-CM

## 2015-08-09 DIAGNOSIS — R7303 Prediabetes: Secondary | ICD-10-CM

## 2015-08-09 LAB — CBC WITH DIFFERENTIAL/PLATELET
BASOS PCT: 1 % (ref 0–1)
Basophils Absolute: 0.1 10*3/uL (ref 0.0–0.1)
EOS ABS: 0.1 10*3/uL (ref 0.0–0.7)
Eosinophils Relative: 2 % (ref 0–5)
HCT: 38.6 % (ref 36.0–46.0)
Hemoglobin: 13.1 g/dL (ref 12.0–15.0)
Lymphocytes Relative: 45 % (ref 12–46)
Lymphs Abs: 2.5 10*3/uL (ref 0.7–4.0)
MCH: 26.8 pg (ref 26.0–34.0)
MCHC: 33.9 g/dL (ref 30.0–36.0)
MCV: 78.9 fL (ref 78.0–100.0)
MONOS PCT: 6 % (ref 3–12)
MPV: 10.4 fL (ref 8.6–12.4)
Monocytes Absolute: 0.3 10*3/uL (ref 0.1–1.0)
NEUTROS PCT: 46 % (ref 43–77)
Neutro Abs: 2.6 10*3/uL (ref 1.7–7.7)
PLATELETS: 204 10*3/uL (ref 150–400)
RBC: 4.89 MIL/uL (ref 3.87–5.11)
RDW: 15.7 % — AB (ref 11.5–15.5)
WBC: 5.6 10*3/uL (ref 4.0–10.5)

## 2015-08-09 LAB — BASIC METABOLIC PANEL WITH GFR
BUN: 14 mg/dL (ref 7–25)
CALCIUM: 9.2 mg/dL (ref 8.6–10.4)
CO2: 26 mmol/L (ref 20–31)
CREATININE: 0.73 mg/dL (ref 0.50–0.99)
Chloride: 103 mmol/L (ref 98–110)
GFR, Est Non African American: 87 mL/min (ref >=60)
Glucose, Bld: 100 mg/dL — ABNORMAL HIGH (ref 65–99)
Potassium: 4.1 mmol/L (ref 3.5–5.3)
Sodium: 138 mmol/L (ref 135–146)

## 2015-08-09 LAB — LIPID PANEL
CHOLESTEROL: 250 mg/dL — AB (ref 125–200)
HDL: 86 mg/dL (ref >=46)
LDL Cholesterol: 147 mg/dL — ABNORMAL HIGH (ref <130)
TRIGLYCERIDES: 83 mg/dL (ref <150)
Total CHOL/HDL Ratio: 2.9 Ratio (ref <=5.0)
VLDL: 17 mg/dL (ref <30)

## 2015-08-09 LAB — HEPATIC FUNCTION PANEL
ALBUMIN: 4.1 g/dL (ref 3.6–5.1)
ALT: 15 U/L (ref 6–29)
AST: 20 U/L (ref 10–35)
Alkaline Phosphatase: 60 U/L (ref 33–130)
BILIRUBIN TOTAL: 0.6 mg/dL (ref 0.2–1.2)
Bilirubin, Direct: 0.1 mg/dL (ref <=0.2)
Indirect Bilirubin: 0.5 mg/dL (ref 0.2–1.2)
Total Protein: 6.5 g/dL (ref 6.1–8.1)

## 2015-08-09 LAB — TSH: TSH: 0.464 u[IU]/mL (ref 0.350–4.500)

## 2015-08-09 LAB — MAGNESIUM: MAGNESIUM: 1.8 mg/dL (ref 1.5–2.5)

## 2015-08-09 NOTE — Progress Notes (Signed)
Patient ID: [name], female   DOB: 10/23/1950, 65 y.o.   MRN: [id number]   This very nice 65 y.o. Married [place]  Lady  presents for  follow up with Hypertension, Hyperlipidemia, Pre-Diabetes and Vitamin D Deficiency. Patient is s/p R THR & TKR and relate that she recently underwent a "Flexigenic" treatment whereby she paid $2600 to have an "aspiration of "fat" stem cells ",  then re-injected into her L hip & knee joints about 3 months ago.    Patient is treated for HTN & BP has been controlled at home. Today's BP: 124/66 mmHg. Patient has had no complaints of any cardiac type chest pain, palpitations, dyspnea/orthopnea/PND, dizziness, claudication, or dependent edema.   Hyperlipidemia is not controlled with diet & supplements. Last Lipids were not at goal with  Cholesterol 237*; HDL 62; LDL 148*; Triglycerides 133 on 09/21/2014.   Also, the patient has history of PreDiabetes with A1c 6.0% in Oct 2014 and has had no symptoms of reactive hypoglycemia, diabetic polys, paresthesias or visual blurring.  Last A1c was 5.8% on 09/21/2014.    Further, the patient also has history of Vitamin D Deficiency and supplements vitamin D without any suspected side-effects. Last vitamin D was 54 on 09/21/2014.  Medication Sig  . aspirin 81 MG tablet Take 81 mg by mouth daily.  . bisoprolol-hctz (ZIAC) 10-6.25  TAKE 1 TABLET BY MOUTH EVERY MORNING FOR BLOOD PRESSURE  . VITAMIN D3 5000 UNITS TABS Take 5,000 Units by mouth daily.  . clonazePAM  0.5 MG tablet TAKE 1/2-1 TABLET BY MOUTH AT BEDTIME FOR SLEEP  . Cyanocobalamin (B-12 PO) Take by mouth daily.  . cyclobenzaprine 10 MG tablet Take 10 mg by mouth 2 (two) times daily as needed.  . enalapril  20 MG tablet TAKE 1 TABLET BY MOUTH EVERY DAY  . FLONASE NA Place into the nose.  . meloxicam15 MG tablet TAKE 1 TABLET BY MOUTH DAILY  . montelukast (SINGULAIR) 10 MG tablet Take 1 tablet (10 mg total) by mouth daily.  . Multiple Vitamins-Minerals  Take 1 capsule by  mouth daily.  . Naproxen Sodium  Take by mouth.  . Omega-3 FISH OIL  Take by mouth.  . traMADol  50 MG tablet Take 1 tablet (50 mg total) by mouth every 6 (six) hours as needed.   No Known Allergies  PMHx:   Past Medical History  Diagnosis Date  . Hypertension   . Hyperlipidemia   . Anxiety   . Diabetes mellitus without complication ([place])     controlled by diet  . Arthritis     osteoarthritis  . PONV (postoperative nausea and vomiting)   . Environmental allergies   . Urgency of urination     Takes oxybutynin; under control at this time  . Allergy     enviromental   Immunization History  Administered Date(s) Administered  . PPD Test 09/21/2014  . Pneumococcal Polysaccharide-23 02/07/2011  . Tdap 02/07/2011   Past Surgical History  Procedure Laterality Date  . Appendectomy      removed as child  . Partial hysterectomy    . Lumbar fusion  02/06/2012    L 4  L5  . Back surgery      lumbar fusion  . Dilation and curettage of uterus    . Total hip arthroplasty  06/02/2012    Procedure: TOTAL HIP ARTHROPLASTY;  Surgeon: Kerin Salen, MD;  Location: [place];  Service: Orthopedics;  Laterality: Right;  . Colonoscopy    .  Total knee arthroplasty Right 12/15/2012    Procedure: TOTAL KNEE ARTHROPLASTY;  Surgeon: Kerin Salen, MD;  Location: [place];  Service: Orthopedics;  Laterality: Right;   FHx:    Reviewed / unchanged  SHx:    Reviewed / unchanged  Systems Review:  Constitutional: Denies fever, chills, wt changes, headaches, insomnia, fatigue, night sweats, change in appetite. Eyes: Denies redness, blurred vision, diplopia, discharge, itchy, watery eyes.  ENT: Denies discharge, congestion, post nasal drip, epistaxis, sore throat, earache, hearing loss, dental pain, tinnitus, vertigo, sinus pain, snoring.  CV: Denies chest pain, palpitations, irregular heartbeat, syncope, dyspnea, diaphoresis, orthopnea, PND, claudication or edema. Respiratory: denies cough, dyspnea, DOE,  pleurisy, hoarseness, laryngitis, wheezing.  Gastrointestinal: Denies dysphagia, odynophagia, heartburn, reflux, water brash, abdominal pain or cramps, nausea, vomiting, bloating, diarrhea, constipation, hematemesis, melena, hematochezia  or hemorrhoids. Genitourinary: Denies dysuria, frequency, urgency, nocturia, hesitancy, discharge, hematuria or flank pain. Musculoskeletal: Denies arthralgias, myalgias, stiffness, jt. swelling, pain, limping or strain/sprain.  Skin: Denies pruritus, rash, hives, warts, acne, eczema or change in skin lesion(s). Neuro: No weakness, tremor, incoordination, spasms, paresthesia or pain. Psychiatric: Denies confusion, memory loss or sensory loss. Endo: Denies change in weight, skin or hair change.  Heme/Lymph: No excessive bleeding, bruising or enlarged lymph nodes.  Physical Exam  BP 124/66 mmHg  Pulse 60  Temp(Src) 97.6 F (36.4 C)  Resp 16  Ht 5' 5.25" (1.657 m)  Wt 158 lb 6.4 oz (71.85 kg)  BMI 26.17 kg/m2  Appears well nourished and in no distress. Eyes: PERRLA, EOMs, conjunctiva no swelling or erythema. Sinuses: No frontal/maxillary tenderness ENT/Mouth: EAC's clear, TM's nl w/o erythema, bulging. Nares clear w/o erythema, swelling, exudates. Oropharynx clear without erythema or exudates. Oral hygiene is good. Tongue normal, non obstructing. Hearing intact.  Neck: Supple. Thyroid nl. Car 2+/2+ without bruits, nodes or JVD. Chest: Respirations nl with BS clear & equal w/o rales, rhonchi, wheezing or stridor.  Cor: Heart sounds normal w/ regular rate and rhythm without sig. murmurs, gallops, clicks, or rubs. Peripheral pulses normal and equal  without edema.  Abdomen: Soft & bowel sounds normal. Non-tender w/o guarding, rebound, hernias, masses, or organomegaly.  Lymphatics: Unremarkable.  Musculoskeletal: Full ROM all peripheral extremities, joint stability, 5/5 strength, and normal gait.  Skin: Warm, dry without exposed rashes, lesions or ecchymosis  apparent.  Neuro: Cranial nerves intact, reflexes equal bilaterally. Sensory-motor testing grossly intact. Tendon reflexes grossly intact.  Pysch: Alert & oriented x 3.  Insight and judgement nl & appropriate. No ideations.  Assessment and Plan:  1. Essential hypertension  - TSH  2. Mixed hyperlipidemia  - Lipid panel - TSH  3. Prediabetes  - Hemoglobin A1c - Insulin, random  4. Vitamin D deficiency  - VITAMIN D 25 Hydroxy   5. Medication management  - CBC with Differential/Platelet - BASIC METABOLIC PANEL WITH GFR - Hepatic function panel - Magnesium   Recommended regular exercise, BP monitoring, weight control, and discussed med and SE's. Recommended labs to assess and monitor clinical status. Further disposition pending results of labs. Over 30 minutes of exam, counseling, chart review was performed

## 2015-08-09 NOTE — Patient Instructions (Signed)

## 2015-08-10 ENCOUNTER — Other Ambulatory Visit: Payer: Self-pay | Admitting: Internal Medicine

## 2015-08-10 ENCOUNTER — Other Ambulatory Visit: Payer: Self-pay | Admitting: *Deleted

## 2015-08-10 DIAGNOSIS — E785 Hyperlipidemia, unspecified: Secondary | ICD-10-CM

## 2015-08-10 LAB — VITAMIN D 25 HYDROXY (VIT D DEFICIENCY, FRACTURES): VIT D 25 HYDROXY: 64 ng/mL (ref 30–100)

## 2015-08-10 LAB — HEPATITIS B SURFACE ANTIBODY,QUALITATIVE: Hep B S Ab: NEGATIVE

## 2015-08-10 LAB — HEMOGLOBIN A1C
Hgb A1c MFr Bld: 5.7 % — ABNORMAL HIGH (ref <5.7)
Mean Plasma Glucose: 117 mg/dL — ABNORMAL HIGH (ref <117)

## 2015-08-10 LAB — HEPATITIS C ANTIBODY: HCV AB: NEGATIVE

## 2015-08-10 LAB — HEPATITIS B CORE ANTIBODY, TOTAL: HEP B C TOTAL AB: NONREACTIVE

## 2015-08-10 LAB — INSULIN, RANDOM: INSULIN: 8 u[IU]/mL (ref 2.0–19.6)

## 2015-08-10 LAB — HEPATITIS A ANTIBODY, TOTAL: Hep A Total Ab: REACTIVE — AB

## 2015-08-10 MED ORDER — CYCLOBENZAPRINE HCL 10 MG PO TABS: 10.0000 mg | ORAL_TABLET | Freq: Three times a day (TID) | ORAL | Status: DC | PRN

## 2015-08-10 MED ORDER — CLONAZEPAM 0.5 MG PO TABS: ORAL_TABLET | ORAL | Status: DC

## 2015-08-10 MED ORDER — ATORVASTATIN CALCIUM 80 MG PO TABS: ORAL_TABLET | ORAL | Status: DC

## 2015-08-10 MED ORDER — TRAMADOL HCL 50 MG PO TABS: 50.0000 mg | ORAL_TABLET | Freq: Four times a day (QID) | ORAL | Status: DC | PRN

## 2015-08-11 LAB — HEPATITIS B E ANTIBODY: Hepatitis Be Antibody: NONREACTIVE

## 2015-09-05 ENCOUNTER — Other Ambulatory Visit: Payer: Self-pay | Admitting: Physician Assistant

## 2015-10-05 ENCOUNTER — Ambulatory Visit (HOSPITAL_COMMUNITY)
Admission: RE | Admit: 2015-10-05 | Discharge: 2015-10-05 | Disposition: A | Discharge status: Home / Self Care | Payer: 59 | Source: Ambulatory Visit | Attending: Internal Medicine | Admitting: Internal Medicine

## 2015-10-05 ENCOUNTER — Encounter: Payer: Self-pay | Admitting: Internal Medicine

## 2015-10-05 ENCOUNTER — Ambulatory Visit (INDEPENDENT_AMBULATORY_CARE_PROVIDER_SITE_OTHER): Payer: 59 | Admitting: Internal Medicine

## 2015-10-05 VITALS — BP 154/76 | HR 64 | Temp 97.3°F | Resp 16 | Ht 66.0 in | Wt 158.8 lb

## 2015-10-05 DIAGNOSIS — Z79899 Other long term (current) drug therapy: Secondary | ICD-10-CM

## 2015-10-05 DIAGNOSIS — I10 Essential (primary) hypertension: Secondary | ICD-10-CM | POA: Diagnosis not present

## 2015-10-05 DIAGNOSIS — F17200 Nicotine dependence, unspecified, uncomplicated: Secondary | ICD-10-CM | POA: Diagnosis not present

## 2015-10-05 DIAGNOSIS — Z0000 Encounter for general adult medical examination without abnormal findings: Secondary | ICD-10-CM

## 2015-10-05 DIAGNOSIS — R5383 Other fatigue: Secondary | ICD-10-CM

## 2015-10-05 DIAGNOSIS — Z0001 Encounter for general adult medical examination with abnormal findings: Secondary | ICD-10-CM

## 2015-10-05 DIAGNOSIS — R7303 Prediabetes: Secondary | ICD-10-CM

## 2015-10-05 DIAGNOSIS — E559 Vitamin D deficiency, unspecified: Secondary | ICD-10-CM

## 2015-10-05 DIAGNOSIS — R918 Other nonspecific abnormal finding of lung field: Secondary | ICD-10-CM | POA: Insufficient documentation

## 2015-10-05 DIAGNOSIS — Z1212 Encounter for screening for malignant neoplasm of rectum: Secondary | ICD-10-CM

## 2015-10-05 DIAGNOSIS — E782 Mixed hyperlipidemia: Secondary | ICD-10-CM

## 2015-10-05 LAB — CBC WITH DIFFERENTIAL/PLATELET
BASOS ABS: 0 10*3/uL (ref 0.0–0.1)
BASOS PCT: 0 % (ref 0–1)
Eosinophils Absolute: 0.4 10*3/uL (ref 0.0–0.7)
Eosinophils Relative: 5 % (ref 0–5)
HEMATOCRIT: 40.1 % (ref 36.0–46.0)
HEMOGLOBIN: 14 g/dL (ref 12.0–15.0)
LYMPHS PCT: 36 % (ref 12–46)
Lymphs Abs: 2.6 10*3/uL (ref 0.7–4.0)
MCH: 28 pg (ref 26.0–34.0)
MCHC: 34.9 g/dL (ref 30.0–36.0)
MCV: 80.2 fL (ref 78.0–100.0)
MPV: 9.9 fL (ref 8.6–12.4)
Monocytes Absolute: 0.6 10*3/uL (ref 0.1–1.0)
Monocytes Relative: 8 % (ref 3–12)
NEUTROS ABS: 3.7 10*3/uL (ref 1.7–7.7)
Neutrophils Relative %: 51 % (ref 43–77)
Platelets: 241 10*3/uL (ref 150–400)
RBC: 5 MIL/uL (ref 3.87–5.11)
RDW: 15.7 % — AB (ref 11.5–15.5)
WBC: 7.3 10*3/uL (ref 4.0–10.5)

## 2015-10-05 LAB — HEPATIC FUNCTION PANEL
ALK PHOS: 61 U/L (ref 33–130)
ALT: 10 U/L (ref 6–29)
AST: 17 U/L (ref 10–35)
Albumin: 4.3 g/dL (ref 3.6–5.1)
BILIRUBIN DIRECT: 0.1 mg/dL (ref <=0.2)
BILIRUBIN INDIRECT: 0.5 mg/dL (ref 0.2–1.2)
TOTAL PROTEIN: 6.8 g/dL (ref 6.1–8.1)
Total Bilirubin: 0.6 mg/dL (ref 0.2–1.2)

## 2015-10-05 LAB — IRON AND TIBC
%SAT: 21 % (ref 11–50)
Iron: 79 ug/dL (ref 45–160)
TIBC: 384 ug/dL (ref 250–450)
UIBC: 305 ug/dL (ref 125–400)

## 2015-10-05 LAB — BASIC METABOLIC PANEL WITH GFR
BUN: 17 mg/dL (ref 7–25)
CALCIUM: 9.9 mg/dL (ref 8.6–10.4)
CO2: 28 mmol/L (ref 20–31)
Chloride: 103 mmol/L (ref 98–110)
Creat: 0.67 mg/dL (ref 0.50–0.99)
GLUCOSE: 85 mg/dL (ref 65–99)
POTASSIUM: 4.2 mmol/L (ref 3.5–5.3)
SODIUM: 141 mmol/L (ref 135–146)

## 2015-10-05 LAB — LIPID PANEL
CHOL/HDL RATIO: 2.4 ratio (ref <=5.0)
CHOLESTEROL: 204 mg/dL — AB (ref 125–200)
HDL: 84 mg/dL (ref >=46)
LDL CALC: 105 mg/dL (ref <130)
TRIGLYCERIDES: 77 mg/dL (ref <150)
VLDL: 15 mg/dL (ref <30)

## 2015-10-05 LAB — VITAMIN B12: Vitamin B-12: 928 pg/mL (ref 200–1100)

## 2015-10-05 LAB — MAGNESIUM: Magnesium: 1.8 mg/dL (ref 1.5–2.5)

## 2015-10-05 LAB — TSH: TSH: 0.92 m[IU]/L

## 2015-10-05 NOTE — Patient Instructions (Signed)
Recommend Adult Low Dose Aspirin or   coated  Aspirin 81 mg daily   To reduce risk of Colon Cancer 20 %,   Skin Cancer 26 % ,   Melanoma 46%   and   Pancreatic cancer 60%   ++++++++++++++++++++++++++++++++++++++++++++++++++++++ Vitamin D goal   is between 70-100.   Please make sure that you are taking your Vitamin D as directed.   It is very important as a natural anti-inflammatory   helping hair, skin, and nails, as well as reducing stroke and heart attack risk.   It helps your bones and helps with mood.  It also decreases numerous cancer risks so please take it as directed.   Low Vit D is associated with a 200-300% higher risk for CANCER   and 200-300% higher risk for HEART   ATTACK  &  STROKE.   .....................................Marland Kitchen  It is also associated with higher death rate at younger ages,   autoimmune diseases like Rheumatoid arthritis, Lupus, Multiple Sclerosis.     Also many other serious conditions, like depression, Alzheimer's  Dementia, infertility, muscle aches, fatigue, fibromyalgia - just to name a few.  ++++++++++++++++++++++++++++++++++++++++++++++++  Recommend the book "The END of DIETING" by Dr Excell Seltzer   & the book "The END of DIABETES " by Dr Excell Seltzer  At [email or web address] - get book & Audio CD's     Being diabetic has a  300% increased risk for heart attack, stroke, cancer, and alzheimer- type vascular dementia. It is very important that you work harder with diet by avoiding all foods that are white. Avoid white rice (brown & wild rice is OK), white potatoes (sweetpotatoes in moderation is OK), White bread or wheat bread or anything made out of white flour like bagels, donuts, rolls, buns, biscuits, cakes, pastries, cookies, pizza crust, and pasta (made from white flour & egg whites) - vegetarian pasta or spinach or wheat pasta is OK. Multigrain breads like Arnold's or Pepperidge [employer], or multigrain sandwich thins or flatbreads.  Diet,  exercise and weight loss can reverse and cure diabetes in the early stages.  Diet, exercise and weight loss is very important in the control and prevention of complications of diabetes which affects every system in your body, ie. Brain - dementia/stroke, eyes - glaucoma/blindness, heart - heart attack/heart failure, kidneys - dialysis, stomach - gastric paralysis, intestines - malabsorption, nerves - severe painful neuritis, circulation - gangrene & loss of a leg(s), and finally cancer and Alzheimers.    I recommend avoid fried & greasy foods,  sweets/candy, white rice (brown or wild rice or Quinoa is OK), white potatoes (sweet potatoes are OK) - anything made from white flour - bagels, doughnuts, rolls, buns, biscuits,white and wheat breads, pizza crust and traditional pasta made of white flour & egg white(vegetarian pasta or spinach or wheat pasta is OK).  Multi-grain bread is OK - like multi-grain flat bread or sandwich thins. Avoid alcohol in excess. Exercise is also important.    Eat all the vegetables you want - avoid meat, especially red meat and dairy - especially cheese.  Cheese is the most concentrated form of trans-fats which is the worst thing to clog up our arteries. Veggie cheese is OK which can be found in the fresh produce section at [employer] or [employer] or [employer]  ++++++++++++++++++++++++++++++++++++++++++++++++++ DASH Eating Plan  DASH stands for "Dietary Approaches to Stop Hypertension."   The DASH eating plan is a healthy eating plan that has been shown to reduce high blood  pressure (hypertension). Additional health benefits may include reducing the risk of type 2 diabetes mellitus, heart disease, and stroke. The DASH eating plan may also help with weight loss.  WHAT DO I NEED TO KNOW ABOUT THE DASH EATING PLAN?  For the DASH eating plan, you will follow these general guidelines:  Choose foods with a percent daily value for sodium of less than 5% (as listed on the food  label).  Use salt-free seasonings or herbs instead of table salt or sea salt.  Check with your health care provider or pharmacist before using salt substitutes.  Eat lower-sodium products, often labeled as "lower sodium" or "no salt added."  Eat fresh foods.  Eat more vegetables, fruits, and low-fat dairy products.    Choose whole grains. Look for the word "whole" as the first word in the ingredient list.  Choose fish   Limit sweets, desserts, sugars, and sugary drinks.  Choose heart-healthy fats.  Eat veggie cheese   Eat more home-cooked food and less restaurant, buffet, and fast food.  Limit fried foods.  [place]uffaker foods using methods other than frying.  Limit canned vegetables. If you do use them, rinse them well to decrease the sodium.  When eating at a restaurant, ask that your food be prepared with less salt, or no salt if possible.                      WHAT FOODS CAN I EAT?  Read Dr Fara Olden Fuhrman's books on The End of Dieting & The End of Diabetes  Grains  Whole grain or whole wheat bread. Brown rice. Whole grain or whole wheat pasta. Quinoa, bulgur, and whole grain cereals. Low-sodium cereals. Corn or whole wheat flour tortillas. Whole grain cornbread. Whole grain crackers. Low-sodium crackers.  Vegetables  Fresh or frozen vegetables (raw, steamed, roasted, or grilled). Low-sodium or reduced-sodium tomato and vegetable juices. Low-sodium or reduced-sodium tomato sauce and paste. Low-sodium or reduced-sodium canned vegetables.   Fruits  All fresh, canned (in natural juice), or frozen fruits.  Protein Products   All fish and seafood.  Dried beans, peas, or lentils. Unsalted nuts and seeds. Unsalted canned beans.  Dairy  Low-fat dairy products, such as skim or 1% milk, 2% or reduced-fat cheeses, low-fat ricotta or cottage cheese, or plain low-fat yogurt. Low-sodium or reduced-sodium cheeses.  Fats and Oils  Tub margarines without trans fats. Light or  reduced-fat mayonnaise and salad dressings (reduced sodium). Avocado. Safflower, olive, or canola oils. Natural peanut or almond butter.  Other  Unsalted popcorn and pretzels. The items listed above may not be a complete list of recommended foods or beverages. Contact your dietitian for more options.  +++++++++++++++++++++++++++++++++++++++++++  WHAT FOODS ARE NOT RECOMMENDED?  Grains/ White flour or wheat flour  White bread. White pasta. White rice. Refined cornbread. Bagels and croissants. Crackers that contain trans fat.  Vegetables  Creamed or fried vegetables. Vegetables in a . Regular canned vegetables. Regular canned tomato sauce and paste. Regular tomato and vegetable juices.  Fruits  Dried fruits. Canned fruit in light or heavy syrup. Fruit juice.  Meat and Other Protein Products  Meat in general - RED mwaet & White meat.  Fatty cuts of meat. Ribs, chicken wings, bacon, sausage, bologna, salami, chitterlings, fatback, hot dogs, bratwurst, and packaged luncheon meats.  Dairy  Whole or 2% milk, cream, half-and-half, and cream cheese. Whole-fat or sweetened yogurt. Full-fat cheeses or blue cheese. Nondairy creamers and whipped toppings. Processed cheese, cheese spreads, or  cheese curds.  Condiments  Onion and garlic salt, seasoned salt, table salt, and sea salt. Canned and packaged gravies. Worcestershire sauce. Tartar sauce. Barbecue sauce. Teriyaki sauce. Soy sauce, including reduced sodium. Steak sauce. Fish sauce. Oyster sauce. Cocktail sauce. Horseradish. Ketchup and mustard. Meat flavorings and tenderizers. Bouillon cubes. Hot sauce. Tabasco sauce. Marinades. Taco seasonings. Relishes.  Fats and Oils Butter, stick margarine, lard, shortening and bacon fat. Coconut, palm kernel, or palm oils. Regular salad dressings.  Pickles and olives. Salted popcorn and pretzels.  The items listed above may not be a complete list of foods and beverages to avoid.   Preventive  Care for Adults  A healthy lifestyle and preventive care can promote health and wellness. Preventive health guidelines for women include the following key practices.  A routine yearly physical is a good way to check with your health care provider about your health and preventive screening. It is a chance to share any concerns and updates on your health and to receive a thorough exam.  Visit your dentist for a routine exam and preventive care every 6 months. Brush your teeth twice a day and floss once a day. Good oral hygiene prevents tooth decay and gum disease.  The frequency of eye exams is based on your age, health, family medical history, use of contact lenses, and other factors. Follow your health care provider's recommendations for frequency of eye exams.  Eat a healthy diet. Foods like vegetables, fruits, whole grains, low-fat dairy products, and lean protein foods contain the nutrients you need without too many calories. Decrease your intake of foods high in solid fats, added sugars, and salt. Eat the right amount of calories for you.Get information about a proper diet from your health care provider, if necessary.  Regular physical exercise is one of the most important things you can do for your health. Most adults should get at least 150 minutes of moderate-intensity exercise (any activity that increases your heart rate and causes you to sweat) each week. In addition, most adults need muscle-strengthening exercises on 2 or more days a week.  Maintain a healthy weight. The body mass index (BMI) is a screening tool to identify possible weight problems. It provides an estimate of body fat based on height and weight. Your health care provider can find your BMI and can help you achieve or maintain a healthy weight.For adults 20 years and older:  A BMI below 18.5 is considered underweight.  A BMI of 18.5 to 24.9 is normal.  A BMI of 25 to 29.9 is considered overweight.  A BMI of 30 and  above is considered obese.  Maintain normal blood lipids and cholesterol levels by exercising and minimizing your intake of saturated fat. Eat a balanced diet with plenty of fruit and vegetables. If your lipid or cholesterol levels are high, you are over 50, or you are at high risk for heart disease, you may need your cholesterol levels checked more frequently.Ongoing high lipid and cholesterol levels should be treated with medicines if diet and exercise are not working.  If you smoke, find out from your health care provider how to quit. If you do not use tobacco, do not start.  Lung cancer screening is recommended for adults aged 15-80 years who are at high risk for developing lung cancer because of a history of smoking. A yearly low-dose CT scan of the lungs is recommended for people who have at least a 30-pack-year history of smoking and are a current smoker or  have quit within the past 15 years. A pack year of smoking is smoking an average of 1 pack of cigarettes a day for 1 year (for example: 1 pack a day for 30 years or 2 packs a day for 15 years). Yearly screening should continue until the smoker has stopped smoking for at least 15 years. Yearly screening should be stopped for people who develop a health problem that would prevent them from having lung cancer treatment.  Avoid use of street drugs. Do not share needles with anyone. Ask for help if you need support or instructions about stopping the use of drugs.  High blood pressure causes heart disease and increases the risk of stroke.  Ongoing high blood pressure should be treated with medicines if weight loss and exercise do not work.  If you are 52-67 years old, ask your health care provider if you should take aspirin to prevent strokes.  Diabetes screening involves taking a blood sample to check your fasting blood sugar level. This should be done once every 3 years, after age 9, if you are within normal weight and without risk factors for  diabetes. Testing should be considered at a younger age or be carried out more frequently if you are overweight and have at least 1 risk factor for diabetes.  Breast cancer screening is essential preventive care for women. You should practice "breast self-awareness." This means understanding the normal appearance and feel of your breasts and may include breast self-examination. Any changes detected, no matter how small, should be reported to a health care provider. Women in their 45s and 30s should have a clinical breast exam (CBE) by a health care provider as part of a regular health exam every 1 to 3 years. After age 76, women should have a CBE every year. Starting at age 54, women should consider having a mammogram (breast X-ray test) every year. Women who have a family history of breast cancer should talk to their health care provider about genetic screening. Women at a high risk of breast cancer should talk to their health care providers about having an MRI and a mammogram every year.  Breast cancer gene (BRCA)-related cancer risk assessment is recommended for women who have family members with BRCA-related cancers. BRCA-related cancers include breast, ovarian, tubal, and peritoneal cancers. Having family members with these cancers may be associated with an increased risk for harmful changes (mutations) in the breast cancer genes BRCA1 and BRCA2. Results of the assessment will determine the need for genetic counseling and BRCA1 and BRCA2 testing.  Routine pelvic exams to screen for cancer are no longer recommended for nonpregnant women who are considered low risk for cancer of the pelvic organs (ovaries, uterus, and vagina) and who do not have symptoms. Ask your health care provider if a screening pelvic exam is right for you.  If you have had past treatment for cervical cancer or a condition that could lead to cancer, you need Pap tests and screening for cancer for at least 20 years after your  treatment. If Pap tests have been discontinued, your risk factors (such as having a new sexual partner) need to be reassessed to determine if screening should be resumed. Some women have medical problems that increase the chance of getting cervical cancer. In these cases, your health care provider may recommend more frequent screening and Pap tests.    Colorectal cancer can be detected and often prevented. Most routine colorectal cancer screening begins at the age of 73 years and  continues through age 75 years. However, your health care provider may recommend screening at an earlier age if you have risk factors for colon cancer. On a yearly basis, your health care provider may provide home test kits to check for hidden blood in the stool. Use of a small camera at the end of a tube, to directly examine the colon (sigmoidoscopy or colonoscopy), can detect the earliest forms of colorectal cancer. Talk to your health care provider about this at age 50, when routine screening begins. Direct exam of the colon should be repeated every 5-10 years through age 75 years, unless early forms of pre-cancerous polyps or small growths are found.  Osteoporosis is a disease in which the bones lose minerals and strength with aging. This can result in serious bone fractures or breaks. The risk of osteoporosis can be identified using a bone density scan. Women ages 65 years and over and women at risk for fractures or osteoporosis should discuss screening with their health care providers. Ask your health care provider whether you should take a calcium supplement or vitamin D to reduce the rate of osteoporosis.  Menopause can be associated with physical symptoms and risks. Hormone replacement therapy is available to decrease symptoms and risks. You should talk to your health care provider about whether hormone replacement therapy is right for you.  Use sunscreen. Apply sunscreen liberally and repeatedly throughout the day. You  should seek shade when your shadow is shorter than you. Protect yourself by wearing long sleeves, pants, a wide-brimmed hat, and sunglasses year round, whenever you are outdoors.  Once a month, do a whole body skin exam, using a mirror to look at the skin on your back. Tell your health care provider of new moles, moles that have irregular borders, moles that are larger than a pencil eraser, or moles that have changed in shape or color.  Stay current with required vaccines (immunizations).  Influenza vaccine. All adults should be immunized every year.  Tetanus, diphtheria, and acellular pertussis (Td, Tdap) vaccine. Pregnant women should receive 1 dose of Tdap vaccine during each pregnancy. The dose should be obtained regardless of the length of time since the last dose. Immunization is preferred during the 27th-36th week of gestation. An adult who has not previously received Tdap or who does not know her vaccine status should receive 1 dose of Tdap. This initial dose should be followed by tetanus and diphtheria toxoids (Td) booster doses every 10 years. Adults with an unknown or incomplete history of completing a 3-dose immunization series with Td-containing vaccines should begin or complete a primary immunization series including a Tdap dose. Adults should receive a Td booster every 10 years.    Zoster vaccine. One dose is recommended for adults aged 60 years or older unless certain conditions are present.    Pneumococcal 13-valent conjugate (PCV13) vaccine. When indicated, a person who is uncertain of her immunization history and has no record of immunization should receive the PCV13 vaccine. An adult aged 19 years or older who has certain medical conditions and has not been previously immunized should receive 1 dose of PCV13 vaccine. This PCV13 should be followed with a dose of pneumococcal polysaccharide (PPSV23) vaccine. The PPSV23 vaccine dose should be obtained at least 8 weeks after the dose  of PCV13 vaccine. An adult aged 19 years or older who has certain medical conditions and previously received 1 or more doses of PPSV23 vaccine should receive 1 dose of PCV13. The PCV13 vaccine dose should   be obtained 1 or more years after the last PPSV23 vaccine dose.    Pneumococcal polysaccharide (PPSV23) vaccine. When PCV13 is also indicated, PCV13 should be obtained first. All adults aged 65 years and older should be immunized. An adult younger than age 65 years who has certain medical conditions should be immunized. Any person who resides in a nursing home or long-term care facility should be immunized. An adult smoker should be immunized. People with an immunocompromised condition and certain other conditions should receive both PCV13 and PPSV23 vaccines. People with human immunodeficiency virus (HIV) infection should be immunized as soon as possible after diagnosis. Immunization during chemotherapy or radiation therapy should be avoided. Routine use of PPSV23 vaccine is not recommended for American Indians, [place] Natives, or people younger than 65 years unless there are medical conditions that require PPSV23 vaccine. When indicated, people who have unknown immunization and have no record of immunization should receive PPSV23 vaccine. One-time revaccination 5 years after the first dose of PPSV23 is recommended for people aged 19-64 years who have chronic kidney failure, nephrotic syndrome, asplenia, or immunocompromised conditions. People who received 1-2 doses of PPSV23 before age 65 years should receive another dose of PPSV23 vaccine at age 65 years or later if at least 5 years have passed since the previous dose. Doses of PPSV23 are not needed for people immunized with PPSV23 at or after age 65 years.   Preventive Services / Frequency  Ages 65 years and over  Blood pressure check.  Lipid and cholesterol check.  Lung cancer screening. / Every year if you are aged 55-80 years and have a  30-pack-year history of smoking and currently smoke or have quit within the past 15 years. Yearly screening is stopped once you have quit smoking for at least 15 years or develop a health problem that would prevent you from having lung cancer treatment.  Clinical breast exam.** / Every year after age 40 years.  BRCA-related cancer risk assessment.** / For women who have family members with a BRCA-related cancer (breast, ovarian, tubal, or peritoneal cancers).  Mammogram.** / Every year beginning at age 40 years and continuing for as long as you are in good health. Consult with your health care provider.  Pap test.** / Every 3 years starting at age 30 years through age 65 or 70 years with 3 consecutive normal Pap tests. Testing can be stopped between 65 and 70 years with 3 consecutive normal Pap tests and no abnormal Pap or HPV tests in the past 10 years.  Fecal occult blood test (FOBT) of stool. / Every year beginning at age 50 years and continuing until age 75 years. You may not need to do this test if you get a colonoscopy every 10 years.  Flexible sigmoidoscopy or colonoscopy.** / Every 5 years for a flexible sigmoidoscopy or every 10 years for a colonoscopy beginning at age 50 years and continuing until age 75 years.  Hepatitis C blood test.** / For all people born from 1945 through 1965 and any individual with known risks for hepatitis C.  Osteoporosis screening.** / A one-time screening for women ages 65 years and over and women at risk for fractures or osteoporosis.  Skin self-exam. / Monthly.  Influenza vaccine. / Every year.  Tetanus, diphtheria, and acellular pertussis (Tdap/Td) vaccine.** / 1 dose of Td every 10 years.  Zoster vaccine.** / 1 dose for adults aged 60 years or older.  Pneumococcal 13-valent conjugate (PCV13) vaccine.** / Consult your health care provider.    Pneumococcal polysaccharide (PPSV23) vaccine.** / 1 dose for all adults aged 68 years and older. Screening  for abdominal aortic aneurysm (AAA)  by ultrasound is recommended for people who have history of high blood pressure or who are current or former smokers.

## 2015-10-05 NOTE — Progress Notes (Signed)
Patient ID: [name], female   DOB: [birth year], 65 y.o.   MRN: [id number]  Annual Screening/Preventative Visit And Comprehensive Evaluation &  Examination  This very nice 65 y.o. Mickel Fuchs who  presents for a Wellness/Preventative Visit & comprehensive evaluation and management of multiple medical co-morbidities.  Patient has been followed for HTN, Prediabetes, Hyperlipidemia and Vitamin D Deficiency.    HTN predates since 89. Patient's BP has been controlled at home and patient denies any cardiac symptoms as chest pain, palpitations, shortness of breath, dizziness or ankle swelling. Today's BP is 154/76 and normalized to 142/74 on recheck.     Patient's hyperlipidemia is not controlled with diet and medications. Patient denies myalgias or other medication SE's. Last lipids were not at goal with sporadic use of her medications and were Cholesterol 250*; HDL 86; LDL 147*; Triglycerides 83 on 08/09/2015.   Patient has is screened expectantly for prediabetes and patient denies reactive hypoglycemic symptoms, visual blurring, diabetic polys, or paresthesias. Last A1c was  5.7% on 08/09/2015.   Finally, patient has history of Vitamin D Deficiency and last Vitamin D was 64 on 08/09/2015.    Medication Sig  . aspirin 81 MG tablet Take 81 mg by mouth daily.  Marland Kitchen atorvastatin80 MG tablet Take 1/2 to 1 tablet daily or as directed for Cholesterol  . bisoprolol-hctz (ZIAC) 10-6.25 TAKE 1 TABLET BY MOUTH EVERY MORNING FOR BLOOD PRESSURE  . VITAMIN D 5000 UNITS TABS Take 5,000 Units by mouth daily.  . clonazePAM  0.5 MG TAKE 1/2-1 TABLET BY MOUTH AT BEDTIME FOR SLEEP  . B-12 tab Take by mouth daily.  . cyclobenzaprine10 MG tablet Take 1 tablet (10 mg total) by mouth 3 (three) times daily as needed for muscle spasms.  . enalapril  20 MG tablet TAKE 1 TABLET BY MOUTH EVERY DAY  . FLONASE  Place into the nose.  . meloxicam (MOBIC) 15 MG  TAKE 1 TABLET BY MOUTH DAILY  . Multiple Vitamins-Minerals  Take  1 capsule by mouth daily.  . Naproxen / ALEVE Take by mouth.  . Omega-3 FISH OIL Take by mouth.  . traMADol  50 MG tablet Take 1 tablet (50 mg total) by mouth every 6 (six) hours as needed.  . montelukast 10 MG tablet Take 1 tablet (10 mg total) by mouth daily.   No Known Allergies   Past Medical History  Diagnosis Date  . Hypertension   . Hyperlipidemia   . Anxiety   . Diabetes mellitus without complication ([place])     controlled by diet  . Arthritis     osteoarthritis  . PONV (postoperative nausea and vomiting)   . Environmental allergies   . Urgency of urination     Takes oxybutynin; under control at this time  . Allergy     enviromental   Health Maintenance  Topic Date Due  . HIV Screening  09/30/1965  . ZOSTAVAX  10/01/2010  . MAMMOGRAM  06/17/2011  . INFLUENZA VACCINE  02/14/2015  . DEXA SCAN  10/01/2015  . PNA vac Low Risk Adult (1 of 2 - PCV13) 10/01/2015  . PAP SMEAR  12/29/2017  . TETANUS/TDAP  02/06/2021  . COLONOSCOPY  02/09/2024  . Hepatitis C Screening  Completed   Immunization History  Administered Date(s) Administered  . PPD Test 09/21/2014  . Pneumococcal Polysaccharide-23 02/07/2011  . Tdap 02/07/2011   Past Surgical History  Procedure Laterality Date  . Appendectomy      removed as child  .  Partial hysterectomy    . Lumbar fusion  02/06/2012    L 4  L5  . Back surgery      lumbar fusion  . Dilation and curettage of uterus    . Total hip arthroplasty  06/02/2012    Procedure: TOTAL HIP ARTHROPLASTY;  Surgeon: Kerin Salen, MD;  Location: [facility];  Service: Orthopedics;  Laterality: Right;  . Colonoscopy    . Total knee arthroplasty Right 12/15/2012    Procedure: TOTAL KNEE ARTHROPLASTY;  Surgeon: Kerin Salen, MD;  Location: [facility];  Service: Orthopedics;  Laterality: Right;   Family History  Problem Relation Age of Onset  . Diabetes Mother   . Colon cancer Mother   . Hypertension Father   . Colon cancer Sister   . Lung cancer Brother   .  Hypertension Brother   . Hypertension Brother   . Esophageal cancer Neg Hx   . Rectal cancer Neg Hx   . Stomach cancer Neg Hx    Social History  Substance Use Topics  . Smoking status: Current Every Day Smoker -- 0.50 packs/day for 30 years  . Smokeless tobacco: Never Used  . Alcohol Use: Yes     Comment: either a glass of wine a night or 2 beers    ROS Constitutional: Denies fever, chills, weight loss/gain, headaches, insomnia,  night sweats, and change in appetite. Does c/o fatigue. Eyes: Denies redness, blurred vision, diplopia, discharge, itchy, watery eyes.  ENT: Denies discharge, congestion, post nasal drip, epistaxis, sore throat, earache, hearing loss, dental pain, Tinnitus, Vertigo, Sinus pain, snoring.  Cardio: Denies chest pain, palpitations, irregular heartbeat, syncope, dyspnea, diaphoresis, orthopnea, PND, claudication, edema Respiratory: denies cough, dyspnea, DOE, pleurisy, hoarseness, laryngitis, wheezing.  Gastrointestinal: Denies dysphagia, heartburn, reflux, water brash, pain, cramps, nausea, vomiting, bloating, diarrhea, constipation, hematemesis, melena, hematochezia, jaundice, hemorrhoids Genitourinary: Denies dysuria, frequency, urgency, nocturia, hesitancy, discharge, hematuria, flank pain Breast: Breast lumps, nipple discharge, bleeding.  Musculoskeletal: Denies  myalgia, stiffness, Jt. Swelling,limp, and strain/sprain. Denies falls. Has ongoing c/o pains in L knee & hx/ LBP.  Skin: Denies puritis, rash, hives, warts, acne, eczema, changing in skin lesion Neuro: No weakness, tremor, incoordination, spasms, paresthesia, pain Psychiatric: Denies confusion, memory loss, sensory loss. Denies Depression. Endocrine: Denies change in weight, skin, hair change, nocturia, and paresthesia, diabetic polys, visual blurring, hyper / hypo glycemic episodes.  Heme/Lymph: No excessive bleeding, bruising, enlarged lymph nodes.  Physical Exam  BP 154/76 reck'd at 142/74         Pulse 64  Temp 97.3 F   Resp 16  Ht 5\' 6"    Wt 158 lb 12.8 oz   BMI 25.64  General Appearance: Well nourished and in no apparent distress.  Eyes: PERRLA, EOMs, conjunctiva no swelling or erythema, normal fundi and vessels. Sinuses: No frontal/maxillary tenderness ENT/Mouth: EACs patent / TMs  nl. Nares clear without erythema, swelling, mucoid exudates. Oral hygiene is good. No erythema, swelling, or exudate. Tongue normal, non-obstructing. Tonsils not swollen or erythematous. Hearing normal.  Neck: Supple, thyroid normal. No bruits, nodes or JVD. Respiratory: Respiratory effort normal.  BS equal and clear bilateral without rales, rhonci, wheezing or stridor. Cardio: Heart sounds are normal with regular rate and rhythm and no murmurs, rubs or gallops. Peripheral pulses are normal and equal bilaterally without edema. No aortic or femoral bruits. Chest: symmetric with normal excursions and percussion. Breasts: Symmetric, without lumps, nipple discharge, retractions, or fibrocystic changes.  Abdomen: Flat, soft, with bowel sounds. Nontender, no guarding,  rebound, hernias, masses, or organomegaly.  Lymphatics: Non tender without lymphadenopathy.  Genitourinary:  Musculoskeletal: Full ROM all peripheral extremities, joint stability, 5/5 strength, and normal gait. Skin: Warm and dry without rashes, lesions, cyanosis, clubbing or  ecchymosis.  Neuro: Cranial nerves intact, reflexes equal bilaterally. Normal muscle tone, no cerebellar symptoms. Sensation intact.  Pysch: Alert and oriented X 3, normal affect, Insight and Judgment appropriate.   Assessment and Plan  1. Annual Preventative Screening Examination  - Microalbumin / creatinine urine ratio - EKG 12-Lead - [place], RETROPERITNL ABD,  LTD - Urinalysis, Routine w reflex microscopic  - POC Hemoccult Bld/Stl  - Vitamin B12 - Iron and TIBC - CBC with Differential/Platelet - BASIC METABOLIC PANEL WITH GFR - Hepatic function panel -  Magnesium - Lipid panel - TSH - Hemoglobin A1c - Insulin, random - VITAMIN D 25 Hydroxy   2. Essential hypertension  - Microalbumin / creatinine urine ratio - EKG 12-Lead - [place], RETROPERITNL ABD,  LTD - TSH  3. Mixed hyperlipidemia  - Lipid panel - TSH  4. Prediabetes  - Hemoglobin A1c - Insulin, random  5. Vitamin D deficiency  - VITAMIN D 25 Hydroxy   6. Screening for rectal cancer  - POC Hemoccult Bld/Stl   7. Other fatigue  - Vitamin B12 - Iron and TIBC - CBC with Differential/Platelet - TSH  8. Medication management  - Urinalysis, Routine w reflex microscopic  - CBC with Differential/Platelet - BASIC METABOLIC PANEL WITH GFR - Hepatic function panel - Magnesium   Continue prudent diet as discussed, weight control, BP monitoring, regular exercise, and medications. Discussed med's effects and SE's. Screening labs and tests as requested with regular follow-up as recommended. Over 40 minutes of exam, counseling, chart review and high complex critical decision making was performed.

## 2015-10-06 LAB — HEMOGLOBIN A1C
HEMOGLOBIN A1C: 5.9 % — AB (ref <5.7)
Mean Plasma Glucose: 123 mg/dL — ABNORMAL HIGH (ref <117)

## 2015-10-06 LAB — URINALYSIS, ROUTINE W REFLEX MICROSCOPIC
BILIRUBIN URINE: NEGATIVE
GLUCOSE, UA: NEGATIVE
HGB URINE DIPSTICK: NEGATIVE
KETONES UR: NEGATIVE
Leukocytes, UA: NEGATIVE
Nitrite: NEGATIVE
PH: 6 (ref 5.0–8.0)
Protein, ur: NEGATIVE
SPECIFIC GRAVITY, URINE: 1.017 (ref 1.001–1.035)

## 2015-10-06 LAB — INSULIN, RANDOM: Insulin: 3.2 u[IU]/mL (ref 2.0–19.6)

## 2015-10-06 LAB — MICROALBUMIN / CREATININE URINE RATIO
CREATININE, URINE: 122 mg/dL (ref 20–320)
MICROALB UR: 2.2 mg/dL
Microalb Creat Ratio: 18 mcg/mg creat (ref <30)

## 2015-10-06 LAB — VITAMIN D 25 HYDROXY (VIT D DEFICIENCY, FRACTURES): Vit D, 25-Hydroxy: 56 ng/mL (ref 30–100)

## 2015-11-30 ENCOUNTER — Encounter: Payer: Self-pay | Admitting: Internal Medicine

## 2015-12-15 ENCOUNTER — Other Ambulatory Visit: Payer: Self-pay | Admitting: Internal Medicine

## 2016-01-02 ENCOUNTER — Other Ambulatory Visit: Payer: Self-pay | Admitting: Orthopedic Surgery

## 2016-01-11 ENCOUNTER — Encounter (HOSPITAL_COMMUNITY)
Admission: RE | Admit: 2016-01-11 | Discharge: 2016-01-11 | Disposition: A | Discharge status: Home / Self Care | Payer: 59 | Source: Ambulatory Visit | Attending: Orthopedic Surgery | Admitting: Orthopedic Surgery

## 2016-01-11 ENCOUNTER — Encounter (HOSPITAL_COMMUNITY): Payer: Self-pay

## 2016-01-11 ENCOUNTER — Ambulatory Visit (HOSPITAL_COMMUNITY)
Admission: RE | Admit: 2016-01-11 | Discharge: 2016-01-11 | Disposition: A | Discharge status: Home / Self Care | Payer: 59 | Source: Ambulatory Visit | Attending: Orthopedic Surgery | Admitting: Orthopedic Surgery

## 2016-01-11 DIAGNOSIS — Z01818 Encounter for other preprocedural examination: Secondary | ICD-10-CM

## 2016-01-11 DIAGNOSIS — Z01812 Encounter for preprocedural laboratory examination: Secondary | ICD-10-CM | POA: Diagnosis not present

## 2016-01-11 HISTORY — DX: Inflammatory liver disease, unspecified: K75.9

## 2016-01-11 HISTORY — DX: Chronic obstructive pulmonary disease, unspecified: J44.9

## 2016-01-11 LAB — CBC WITH DIFFERENTIAL/PLATELET
Basophils Absolute: 0 10*3/uL (ref 0.0–0.1)
Basophils Relative: 1 %
Eosinophils Absolute: 0.1 10*3/uL (ref 0.0–0.7)
Eosinophils Relative: 1 %
HEMATOCRIT: 38.1 % (ref 36.0–46.0)
HEMOGLOBIN: 13.4 g/dL (ref 12.0–15.0)
LYMPHS ABS: 2.7 10*3/uL (ref 0.7–4.0)
Lymphocytes Relative: 43 %
MCH: 27.9 pg (ref 26.0–34.0)
MCHC: 35.2 g/dL (ref 30.0–36.0)
MCV: 79.2 fL (ref 78.0–100.0)
MONO ABS: 0.6 10*3/uL (ref 0.1–1.0)
MONOS PCT: 9 %
NEUTROS ABS: 2.9 10*3/uL (ref 1.7–7.7)
NEUTROS PCT: 46 %
Platelets: 209 10*3/uL (ref 150–400)
RBC: 4.81 MIL/uL (ref 3.87–5.11)
RDW: 14.2 % (ref 11.5–15.5)
WBC: 6.2 10*3/uL (ref 4.0–10.5)

## 2016-01-11 LAB — TYPE AND SCREEN
ABO/RH(D): O POS
Antibody Screen: NEGATIVE

## 2016-01-11 LAB — URINALYSIS, ROUTINE W REFLEX MICROSCOPIC
Bilirubin Urine: NEGATIVE
GLUCOSE, UA: NEGATIVE mg/dL
Hgb urine dipstick: NEGATIVE
KETONES UR: NEGATIVE mg/dL
LEUKOCYTES UA: NEGATIVE
NITRITE: NEGATIVE
PROTEIN: NEGATIVE mg/dL
Specific Gravity, Urine: 1.008 (ref 1.005–1.030)
pH: 8 (ref 5.0–8.0)

## 2016-01-11 LAB — COMPREHENSIVE METABOLIC PANEL
ALT: 15 U/L (ref 14–54)
AST: 20 U/L (ref 15–41)
Albumin: 3.9 g/dL (ref 3.5–5.0)
Alkaline Phosphatase: 70 U/L (ref 38–126)
Anion gap: 8 (ref 5–15)
BUN: 13 mg/dL (ref 6–20)
CHLORIDE: 100 mmol/L — AB (ref 101–111)
CO2: 31 mmol/L (ref 22–32)
Calcium: 9.9 mg/dL (ref 8.9–10.3)
Creatinine, Ser: 0.7 mg/dL (ref 0.44–1.00)
GFR calc Af Amer: >60 mL/min (ref >60)
Glucose, Bld: 81 mg/dL (ref 65–99)
Potassium: 4.1 mmol/L (ref 3.5–5.1)
SODIUM: 139 mmol/L (ref 135–145)
Total Bilirubin: 0.7 mg/dL (ref 0.3–1.2)
Total Protein: 6.6 g/dL (ref 6.5–8.1)

## 2016-01-11 LAB — SURGICAL PCR SCREEN
MRSA, PCR: NEGATIVE
STAPHYLOCOCCUS AUREUS: NEGATIVE

## 2016-01-11 LAB — PROTIME-INR
INR: 0.97 (ref 0.00–1.49)
Prothrombin Time: 13.1 seconds (ref 11.6–15.2)

## 2016-01-11 LAB — APTT: aPTT: 27 seconds (ref 24–37)

## 2016-01-11 NOTE — Pre-Procedure Instructions (Signed)
[name]  01/11/2016      [id number] - [place] [place] [address] Phone: [phone] Fax: [phone]    Your procedure is scheduled on July 12  Report to [facility] at 845 A.M.  Call this number if you have problems the morning of surgery:  [phone]   Remember:  Do not eat food or drink liquids after midnight.  Take these medicines the morning of surgery with A SIP OF WATER bisoprolol-hydrochlorothiazide( Ziac), bupropion (Wellbutrin), Cyclobenzaprine (Flexeril) if needed, Flonase nasal spray if needed, montelukast (Singulair), tramadol (Ultram) if needed   Stop taking aspirin, BC's, Goody's, herbal mediations, Fish Oil, Aleve, Ibuprofen, Advil, Motrin   Do not wear jewelry, make-up or nail polish.  Do not wear lotions, powders, or perfumes.  You may wear deoderant.  Do not shave 48 hours prior to surgery.  Men may shave face and neck.  Do not bring valuables to the hospital.  [facility] is not responsible for any belongings or valuables.  Contacts, dentures or bridgework may not be worn into surgery.  Leave your suitcase in the car.  After surgery it may be brought to your room.  For patients admitted to the hospital, discharge time will be determined by your treatment team.  Patients discharged the day of surgery will not be allowed to drive home.    Special instructions:  [facility] - Preparing for Surgery  Before surgery, you can play an important role.  Because skin is not sterile, your skin needs to be as free of germs as possible.  You can reduce the number of germs on you skin by washing with CHG (chlorahexidine gluconate) soap before surgery.  CHG is an antiseptic cleaner which kills germs and bonds with the skin to continue killing germs even after washing.  Please DO NOT use if you have an allergy to CHG or  antibacterial soaps.  If your skin becomes reddened/irritated stop using the CHG and inform your nurse when you arrive at Short Stay.  Do not shave (including legs and underarms) for at least 48 hours prior to the first CHG shower.  You may shave your face.  Please follow these instructions carefully:   1.  Shower with CHG Soap the night before surgery and the   morning of Surgery.  2.  If you choose to wash your hair, wash your hair first as usual with your   normal shampoo.  3.  After you shampoo, rinse your hair and body thoroughly to remove the Shampoo.  4.  Use CHG as you would any other liquid soap.  You can apply chg directly   to the skin and wash gently with scrungie or a clean washcloth.  5.  Apply the CHG Soap to your body ONLY FROM THE NECK DOWN.   Do not use on open wounds or open sores.  Avoid contact with your eyes, ears, mouth and genitals (private parts).  Wash genitals (private parts)   with your normal soap.  6.  Wash thoroughly, paying special attention to the area where your surgery  will be performed.  7.  Thoroughly rinse your body with warm water from the neck down.  8.  DO NOT shower/wash with your normal soap after using and rinsing off  the CHG Soap.  9.  Pat yourself dry with a clean towel.  10.  Wear clean pajamas.            11.  Place clean sheets on your bed the night of your first shower and do not sleep with pets.  Day of Surgery  Do not apply any lotions/deoderants the morning of surgery.  Please wear clean clothes to the hospital/surgery center.     Please read over the following fact sheets that you were given. Pain Booklet, Coughing and Deep Breathing, Blood Transfusion Information, MRSA Information and Surgical Site Infection Prevention, Incentive Spirometery

## 2016-01-11 NOTE — Progress Notes (Signed)
PCP is Dr. Unk Pinto Denies ever seeing a cardiologist. Denies ever having a card cath, stress test, or echo. Reports that she doesn't check her cbg's on home, and is borderline diet controlled. Instructed pt not to smoke on the day of surgery.

## 2016-01-12 ENCOUNTER — Other Ambulatory Visit (HOSPITAL_COMMUNITY): Payer: 59

## 2016-01-12 LAB — HEMOGLOBIN A1C
HEMOGLOBIN A1C: 5.3 % (ref 4.8–5.6)
MEAN PLASMA GLUCOSE: 105 mg/dL

## 2016-01-12 NOTE — Progress Notes (Signed)
Anesthesia Chart Review:  Pt is a 65 year old female scheduled for L total hip arthroplasty on 01/25/2016 with Frederik Pear, MD  PMH includes:  HTN, DM, hyperlipidemia, COPD, hepatitis A, post-op N/V. Current smoker. BMI 65. S/p R TKA 12/15/12. S/p R THA 06/02/12. S/p lumbar fusion 02/06/12.   Medications include: ASA, lipitor, bisoprolol-hctz, enalapril  Preoperative labs reviewed.  HgbA1c 5.3, glucose 81  Chest x-ray 01/11/16 reviewed. Hyperexpansion without acute findings.  EKG 10/05/15: NSR.   Carotid duplex 01/25/12: <50% B ICA stenosis  If no changes, I anticipate pt can proceed with surgery as scheduled.   Willeen Cass, FNP-BC [facility] Short Stay Surgical Center/Anesthesiology Phone: [phone] 01/12/2016 2:54 PM

## 2016-01-16 ENCOUNTER — Ambulatory Visit: Payer: Self-pay | Admitting: Internal Medicine

## 2016-01-20 ENCOUNTER — Other Ambulatory Visit: Payer: Self-pay | Admitting: Internal Medicine

## 2016-01-24 ENCOUNTER — Encounter: Payer: Self-pay | Admitting: Internal Medicine

## 2016-01-24 ENCOUNTER — Ambulatory Visit (INDEPENDENT_AMBULATORY_CARE_PROVIDER_SITE_OTHER): Payer: 59 | Admitting: Internal Medicine

## 2016-01-24 VITALS — BP 126/70 | HR 57 | Temp 97.3°F | Resp 16 | Ht 66.0 in | Wt 162.2 lb

## 2016-01-24 DIAGNOSIS — I10 Essential (primary) hypertension: Secondary | ICD-10-CM

## 2016-01-24 DIAGNOSIS — Z79899 Other long term (current) drug therapy: Secondary | ICD-10-CM | POA: Diagnosis not present

## 2016-01-24 DIAGNOSIS — E559 Vitamin D deficiency, unspecified: Secondary | ICD-10-CM

## 2016-01-24 DIAGNOSIS — E782 Mixed hyperlipidemia: Secondary | ICD-10-CM

## 2016-01-24 DIAGNOSIS — R7303 Prediabetes: Secondary | ICD-10-CM | POA: Diagnosis not present

## 2016-01-24 DIAGNOSIS — M1612 Unilateral primary osteoarthritis, left hip: Secondary | ICD-10-CM | POA: Diagnosis present

## 2016-01-24 MED ORDER — DEXTROSE-NACL 5-0.45 % IV SOLN: INTRAVENOUS | Status: DC

## 2016-01-24 MED ORDER — BUPROPION HCL ER (XL) 300 MG PO TB24: 300.0000 mg | ORAL_TABLET | Freq: Every day | ORAL | Status: DC

## 2016-01-24 MED ORDER — CEFAZOLIN SODIUM-DEXTROSE 2-4 GM/100ML-% IV SOLN
2.0000 g | INTRAVENOUS | Status: AC
  Administered 2016-01-25: 2 g via INTRAVENOUS
  Filled 2016-01-24: qty 100

## 2016-01-24 MED ORDER — MELOXICAM 15 MG PO TABS: 15.0000 mg | ORAL_TABLET | Freq: Every day | ORAL | Status: DC

## 2016-01-24 NOTE — Progress Notes (Signed)
Assessment and Plan:  Hypertension:  -Continue medication,  -monitor blood pressure at home.  -Continue DASH diet.   -Reminder to go to the ER if any CP, SOB, nausea, dizziness, severe HA, changes vision/speech, left arm numbness and tingling, and jaw pain.  Cholesterol: -Continue diet and exercise.   Pre-diabetes: -Continue diet and exercise.  -checked in Hospital on 01/11/16 -5.3  Vitamin D Def: -continue medications.   Left Hip Osteoarthritis -having total hip tomorrow  Continue diet and meds as discussed. Further disposition pending results of labs.  HPI 65 y.o. female  presents for 3 month follow up with hypertension, hyperlipidemia, prediabetes and vitamin D.   Her blood pressure has been controlled at home, today their BP is BP: 126/70 mmHg.   She does workout. She denies chest pain, shortness of breath, dizziness.  She has not been as active as she is helping her husband through lung cancer.     She is on cholesterol medication and denies myalgias. Her cholesterol is at goal. The cholesterol last visit was:   Lab Results  Component Value Date   CHOL 204* 10/05/2015   HDL 84 10/05/2015   LDLCALC 105 10/05/2015   TRIG 77 10/05/2015   CHOLHDL 2.4 10/05/2015     She has been working on diet and exercise for prediabetes, and denies foot ulcerations, hyperglycemia, hypoglycemia , increased appetite, nausea, paresthesia of the feet, polydipsia, polyuria, visual disturbances, vomiting and weight loss. Last A1C in the office was:  Lab Results  Component Value Date   HGBA1C 5.3 01/11/2016    Patient is on Vitamin D supplement.  Lab Results  Component Value Date   VD25OH 16 10/05/2015     She is a smoker.  She reports that she has been smoking since she was 30.  She reports that she smokes about a half pack per day.   She is having her right hip replaced tomorrow.  She reports that Dr. Mayer Camel will be doing the surgery.  Current Medications:  Current Outpatient  Prescriptions on File Prior to Visit  Medication Sig Dispense Refill  . aspirin 81 MG tablet Take 81 mg by mouth daily.    Marland Kitchen atorvastatin (LIPITOR) 80 MG tablet Take 40 mg by mouth 3 (three) times a week.    . bisoprolol-hydrochlorothiazide (ZIAC) 10-6.25 MG tablet Take 1 tablet by mouth every morning.    Marland Kitchen buPROPion (WELLBUTRIN XL) 300 MG 24 hr tablet Take 300 mg by mouth daily.    . Cholecalciferol (VITAMIN D3) 5000 UNITS TABS Take 5,000 Units by mouth daily.    . clonazePAM (KLONOPIN) 0.5 MG tablet TAKE 1/2-1 TABLET BY MOUTH AT BEDTIME FOR SLEEP 30 tablet 5  . cyclobenzaprine (FLEXERIL) 10 MG tablet Take 1 tablet (10 mg total) by mouth 3 (three) times daily as needed for muscle spasms. 60 tablet 5  . doxycycline (PERIOSTAT) 20 MG tablet Take 20 mg by mouth 2 (two) times daily.    . enalapril (VASOTEC) 20 MG tablet TAKE 1 TABLET BY MOUTH EVERY DAY 90 tablet 1  . fluticasone (FLONASE) 50 MCG/ACT nasal spray Place 1 spray into both nostrils daily as needed for allergies or rhinitis.    . meloxicam (MOBIC) 15 MG tablet TAKE 1 TABLET BY MOUTH DAILY 90 tablet 1  . montelukast (SINGULAIR) 10 MG tablet TAKE 1 TABLET(10 MG) BY MOUTH DAILY 90 tablet 1  . Multiple Vitamins-Minerals (MULTI COMPLETE PO) Take 1 capsule by mouth daily.    . naproxen sodium (ALEVE) 220 MG  tablet Take 440 mg by mouth 2 (two) times daily as needed.    . Omega-3 Fatty Acids (FISH OIL) 1000 MG CAPS Take 1 capsule by mouth daily.    . traMADol (ULTRAM) 50 MG tablet Take 1 tablet (50 mg total) by mouth every 6 (six) hours as needed. 30 tablet 0  . vitamin B-12 (CYANOCOBALAMIN) 1000 MCG tablet Take 1,000 mcg by mouth daily.     No current facility-administered medications on file prior to visit.    Medical History:  Past Medical History  Diagnosis Date  . Hypertension   . Hyperlipidemia   . Anxiety   . Diabetes mellitus without complication ([place])     controlled by diet  . Arthritis     osteoarthritis  . PONV  (postoperative nausea and vomiting)   . Environmental allergies   . Urgency of urination     Takes oxybutynin; under control at this time  . Allergy     enviromental  . COPD (chronic obstructive pulmonary disease) ([facility])     mild  . Hepatitis     hepatitis A    Allergies: No Known Allergies   Review of Systems:  Review of Systems  Constitutional: Negative for fever, weight loss and malaise/fatigue.  HENT: Positive for congestion. Negative for ear pain and sore throat.   Eyes: Negative.   Respiratory: Negative for cough, shortness of breath and wheezing.   Cardiovascular: Negative for chest pain, palpitations and leg swelling.  Gastrointestinal: Negative for heartburn, abdominal pain, diarrhea, constipation, blood in stool and melena.  Genitourinary: Negative.   Skin: Negative.   Neurological: Negative for dizziness, sensory change, loss of consciousness and headaches.    Family history- Review and unchanged  Social history- Review and unchanged  Physical Exam: BP 126/70 mmHg  Pulse 57  Temp(Src) 97.3 F (36.3 C) (Temporal)  Resp 16  Ht 5\' 6"  (1.676 m)  Wt 162 lb 3.2 oz (73.573 kg)  BMI 26.19 kg/m2  SpO2 95% Wt Readings from Last 3 Encounters:  01/24/16 162 lb 3.2 oz (73.573 kg)  01/11/16 161 lb 8 oz (73.256 kg)  10/05/15 158 lb 12.8 oz (72.031 kg)    General Appearance: Well nourished well developed, in no apparent distress. Eyes: PERRLA, EOMs, conjunctiva no swelling or erythema ENT/Mouth: Ear canals normal without obstruction, swelling, erythma, discharge.  TMs normal bilaterally.  Oropharynx moist, clear, without exudate, or postoropharyngeal swelling. Neck: Supple, thyroid normal,no cervical adenopathy  Respiratory: Respiratory effort normal, Breath sounds clear A&P without rhonchi, wheeze, or rale.  No retractions, no accessory usage. Cardio: RRR with no MRGs. Brisk peripheral pulses without edema.  Abdomen: Soft, + BS,  Non tender, no guarding, rebound,  hernias, masses. Musculoskeletal: Full ROM, 5/5 strength, Normal gait Skin: Warm, dry without rashes, lesions, ecchymosis.  Neuro: Awake and oriented X 3, Cranial nerves intact. Normal muscle tone, no cerebellar symptoms. Psych: Normal affect, Insight and Judgment appropriate.    Starlyn Skeans, PA-C 9:13 AM [facility]

## 2016-01-24 NOTE — H&P (Signed)
TOTAL HIP ADMISSION H&P  Patient is admitted for left total hip arthroplasty.  Subjective:  Chief Complaint: left hip pain  HPI: [name], 65 y.o. female, has a history of pain and functional disability in the left hip(s) due to arthritis and patient has failed non-surgical conservative treatments for greater than 12 weeks to include NSAID's and/or analgesics, flexibility and strengthening excercises, weight reduction as appropriate and activity modification.  Onset of symptoms was gradual starting 3 years ago with gradually worsening course since that time.The patient noted no past surgery on the left hip(s).  Patient currently rates pain in the left hip at 10 out of 10 with activity. Patient has night pain, worsening of pain with activity and weight bearing, trendelenberg gait, pain that interfers with activities of daily living and pain with passive range of motion. Patient has evidence of periarticular osteophytes and joint space narrowing by imaging studies. This condition presents safety issues increasing the risk of falls.    There is no current active infection.  Patient Active Problem List   Diagnosis Date Noted  . Prediabetes 03/20/2014  . Medication management 03/20/2014  . Essential hypertension 06/15/2013  . Mixed hyperlipidemia 06/15/2013  . Vitamin D deficiency 06/15/2013  . Osteoarthritis of right knee 12/15/2012   Past Medical History  Diagnosis Date  . Hypertension   . Hyperlipidemia   . Anxiety   . Diabetes mellitus without complication ([place])     controlled by diet  . Arthritis     osteoarthritis  . PONV (postoperative nausea and vomiting)   . Environmental allergies   . Urgency of urination     Takes oxybutynin; under control at this time  . Allergy     enviromental  . COPD (chronic obstructive pulmonary disease) (H[facility])     mild  . Hepatitis     hepatitis A    Past Surgical History  Procedure Laterality Date  . Appendectomy      removed as child   . Partial hysterectomy    . Lumbar fusion  02/06/2012    L 4  L5  . Back surgery      lumbar fusion  . Dilation and curettage of uterus    . Total hip arthroplasty  06/02/2012    Procedure: TOTAL HIP ARTHROPLASTY;  Surgeon: Kerin Salen, MD;  Location: [facility];  Service: Orthopedics;  Laterality: Right;  . Colonoscopy    . Total knee arthroplasty Right 12/15/2012    Procedure: TOTAL KNEE ARTHROPLASTY;  Surgeon: Kerin Salen, MD;  Location: [facility];  Service: Orthopedics;  Laterality: Right;    No prescriptions prior to admission   Allergies  Allergen Reactions  . No Known Allergies     Social History  Substance Use Topics  . Smoking status: Current Every Day Smoker -- 0.25 packs/day for 30 years    Types: Cigarettes  . Smokeless tobacco: Never Used  . Alcohol Use: Yes     Comment: either a glass of wine a night or 2 beers    Family History  Problem Relation Age of Onset  . Diabetes Mother   . Colon cancer Mother   . Hypertension Father   . Colon cancer Sister   . Lung cancer Brother   . Hypertension Brother   . Hypertension Brother   . Esophageal cancer Neg Hx   . Rectal cancer Neg Hx   . Stomach cancer Neg Hx      Review of Systems  Constitutional: Positive for malaise/fatigue.  HENT: Negative.   Eyes: Negative.   Respiratory: Negative.   Cardiovascular:       HTN  Gastrointestinal: Positive for nausea.  Genitourinary: Positive for urgency and frequency.  Musculoskeletal: Positive for myalgias and joint pain.  Skin: Negative.   Neurological: Negative.   Endo/Heme/Allergies: Bruises/bleeds easily.  Psychiatric/Behavioral: Negative.     Objective:  Physical Exam  Constitutional: She is oriented to person, place, and time. She appears well-developed and well-nourished.  HENT:  Head: Normocephalic and atraumatic.  Eyes: Pupils are equal, round, and reactive to light.  Neck: Normal range of motion. Neck supple.  Cardiovascular: Intact distal pulses.    Respiratory: Effort normal.  Musculoskeletal: She exhibits tenderness.  the patient's right hip has good strength good range of motion and no pain.  Patient's left hip does have obvious pain with internal rotation and has obvious stiffness.  Positive heel bump.  Patient's right knee also has good strength good range of motion.  Neurological: She is alert and oriented to person, place, and time.  Skin: Skin is warm and dry.  Psychiatric: She has a normal mood and affect. Her behavior is normal. Judgment and thought content normal.    Vital signs in last 24 hours: Temp:  [97.3 F (36.3 C)] 97.3 F (36.3 C) (07/11 0853) Pulse Rate:  [57] 57 (07/11 0853) Resp:  [16] 16 (07/11 0853) BP: (126)/(70) 126/70 mmHg (07/11 0853) SpO2:  [95 %] 95 % (07/11 0853) Weight:  [73.573 kg (162 lb 3.2 oz)] 73.573 kg (162 lb 3.2 oz) (07/11 0853)  Labs:   Estimated body mass index is 25.64 kg/(m^2) as calculated from the following:   Height as of 10/05/15: 5\' 6"  (1.676 m).   Weight as of 10/05/15: 72.031 kg (158 lb 12.8 oz).   Imaging Review Plain radiographs demonstrate  a left hip that is bone-on-bone arthritis.  She does have periarticular osteophyte formation.  Assessment/Plan:  End stage arthritis, left hip(s)  The patient history, physical examination, clinical judgement of the provider and imaging studies are consistent with end stage degenerative joint disease of the left hip(s) and total hip arthroplasty is deemed medically necessary. The treatment options including medical management, injection therapy, arthroscopy and arthroplasty were discussed at length. The risks and benefits of total hip arthroplasty were presented and reviewed. The risks due to aseptic loosening, infection, stiffness, dislocation/subluxation,  thromboembolic complications and other imponderables were discussed.  The patient acknowledged the explanation, agreed to proceed with the plan and consent was signed. Patient is being  admitted for inpatient treatment for surgery, pain control, PT, OT, prophylactic antibiotics, VTE prophylaxis, progressive ambulation and ADL's and discharge planning.The patient is planning to be discharged home with home health services

## 2016-01-25 ENCOUNTER — Inpatient Hospital Stay (HOSPITAL_COMMUNITY): Payer: 59

## 2016-01-25 ENCOUNTER — Inpatient Hospital Stay (HOSPITAL_COMMUNITY): Payer: 59 | Admitting: Emergency Medicine

## 2016-01-25 ENCOUNTER — Inpatient Hospital Stay (HOSPITAL_COMMUNITY)
Admission: RE | Admit: 2016-01-25 | Discharge: 2016-01-26 | DRG: 470 | Disposition: A | Discharge status: Home Health | Payer: 59 | Source: Ambulatory Visit | Attending: Orthopedic Surgery | Admitting: Orthopedic Surgery

## 2016-01-25 ENCOUNTER — Inpatient Hospital Stay (HOSPITAL_COMMUNITY): Payer: 59 | Admitting: Certified Registered Nurse Anesthetist

## 2016-01-25 ENCOUNTER — Encounter (HOSPITAL_COMMUNITY)
Admission: RE | Disposition: A | Discharge status: Home Health | Payer: Self-pay | Source: Ambulatory Visit | Attending: Orthopedic Surgery

## 2016-01-25 ENCOUNTER — Encounter (HOSPITAL_COMMUNITY): Payer: Self-pay | Admitting: Certified Registered Nurse Anesthetist

## 2016-01-25 DIAGNOSIS — F419 Anxiety disorder, unspecified: Secondary | ICD-10-CM | POA: Diagnosis present

## 2016-01-25 DIAGNOSIS — Z981 Arthrodesis status: Secondary | ICD-10-CM

## 2016-01-25 DIAGNOSIS — I10 Essential (primary) hypertension: Secondary | ICD-10-CM | POA: Diagnosis present

## 2016-01-25 DIAGNOSIS — Z96651 Presence of right artificial knee joint: Secondary | ICD-10-CM | POA: Diagnosis present

## 2016-01-25 DIAGNOSIS — F17210 Nicotine dependence, cigarettes, uncomplicated: Secondary | ICD-10-CM | POA: Diagnosis present

## 2016-01-25 DIAGNOSIS — E119 Type 2 diabetes mellitus without complications: Secondary | ICD-10-CM | POA: Diagnosis present

## 2016-01-25 DIAGNOSIS — J449 Chronic obstructive pulmonary disease, unspecified: Secondary | ICD-10-CM | POA: Diagnosis present

## 2016-01-25 DIAGNOSIS — M25552 Pain in left hip: Secondary | ICD-10-CM | POA: Diagnosis present

## 2016-01-25 DIAGNOSIS — B159 Hepatitis A without hepatic coma: Secondary | ICD-10-CM | POA: Diagnosis present

## 2016-01-25 DIAGNOSIS — D62 Acute posthemorrhagic anemia: Secondary | ICD-10-CM | POA: Diagnosis not present

## 2016-01-25 DIAGNOSIS — Z96642 Presence of left artificial hip joint: Secondary | ICD-10-CM

## 2016-01-25 DIAGNOSIS — M1612 Unilateral primary osteoarthritis, left hip: Secondary | ICD-10-CM | POA: Diagnosis present

## 2016-01-25 DIAGNOSIS — E782 Mixed hyperlipidemia: Secondary | ICD-10-CM | POA: Diagnosis present

## 2016-01-25 HISTORY — PX: TOTAL HIP ARTHROPLASTY: SHX124

## 2016-01-25 LAB — GLUCOSE, CAPILLARY: Glucose-Capillary: 93 mg/dL (ref 65–99)

## 2016-01-25 SURGERY — ARTHROPLASTY, HIP, TOTAL,POSTERIOR APPROACH
Anesthesia: Spinal | Site: Hip | Laterality: Left

## 2016-01-25 MED ORDER — LIDOCAINE 2% (20 MG/ML) 5 ML SYRINGE
INTRAMUSCULAR | Status: DC | PRN
  Administered 2016-01-25: 20 mg via INTRAVENOUS

## 2016-01-25 MED ORDER — ATORVASTATIN CALCIUM 40 MG PO TABS: 40.0000 mg | ORAL_TABLET | ORAL | Status: DC

## 2016-01-25 MED ORDER — PROPOFOL 500 MG/50ML IV EMUL
INTRAVENOUS | Status: DC | PRN
  Administered 2016-01-25: 75 ug/kg/min via INTRAVENOUS

## 2016-01-25 MED ORDER — PROPOFOL 10 MG/ML IV BOLUS
INTRAVENOUS | Status: AC
  Filled 2016-01-25: qty 40

## 2016-01-25 MED ORDER — DOXYCYCLINE HYCLATE 20 MG PO TABS: 20.0000 mg | ORAL_TABLET | Freq: Two times a day (BID) | ORAL | Status: DC

## 2016-01-25 MED ORDER — ONDANSETRON HCL 4 MG/2ML IJ SOLN
4.0000 mg | Freq: Four times a day (QID) | INTRAMUSCULAR | Status: DC | PRN
  Administered 2016-01-25 – 2016-01-26 (×2): 4 mg via INTRAVENOUS
  Filled 2016-01-25 (×2): qty 2

## 2016-01-25 MED ORDER — METOCLOPRAMIDE HCL 5 MG PO TABS
5.0000 mg | ORAL_TABLET | Freq: Three times a day (TID) | ORAL | Status: DC | PRN
Start: 2016-01-25 — End: 2016-01-26

## 2016-01-25 MED ORDER — BUPIVACAINE-EPINEPHRINE (PF) 0.25% -1:200000 IJ SOLN
INTRAMUSCULAR | Status: AC
  Filled 2016-01-25: qty 60

## 2016-01-25 MED ORDER — DEXAMETHASONE SODIUM PHOSPHATE 10 MG/ML IJ SOLN
10.0000 mg | Freq: Once | INTRAMUSCULAR | Status: AC
  Administered 2016-01-26: 10 mg via INTRAVENOUS
  Filled 2016-01-25: qty 1

## 2016-01-25 MED ORDER — LACTATED RINGERS IV SOLN
INTRAVENOUS | Status: DC
  Administered 2016-01-25 (×3): via INTRAVENOUS

## 2016-01-25 MED ORDER — BUPROPION HCL ER (XL) 300 MG PO TB24
300.0000 mg | ORAL_TABLET | Freq: Every day | ORAL | Status: DC
  Administered 2016-01-26: 300 mg via ORAL
  Filled 2016-01-25: qty 1

## 2016-01-25 MED ORDER — VITAMIN B-12 1000 MCG PO TABS
1000.0000 ug | ORAL_TABLET | Freq: Every day | ORAL | Status: DC
  Administered 2016-01-25 – 2016-01-26 (×2): 1000 ug via ORAL
  Filled 2016-01-25 (×2): qty 1

## 2016-01-25 MED ORDER — ACETAMINOPHEN 325 MG PO TABS
650.0000 mg | ORAL_TABLET | Freq: Four times a day (QID) | ORAL | Status: DC | PRN
  Administered 2016-01-25: 650 mg via ORAL
  Filled 2016-01-25: qty 2

## 2016-01-25 MED ORDER — FLUTICASONE PROPIONATE 50 MCG/ACT NA SUSP
1.0000 | Freq: Every day | NASAL | Status: DC | PRN
  Filled 2016-01-25: qty 16

## 2016-01-25 MED ORDER — METHOCARBAMOL 1000 MG/10ML IJ SOLN
500.0000 mg | Freq: Four times a day (QID) | INTRAMUSCULAR | Status: DC | PRN
  Filled 2016-01-25: qty 5

## 2016-01-25 MED ORDER — ENALAPRIL MALEATE 20 MG PO TABS
20.0000 mg | ORAL_TABLET | Freq: Every day | ORAL | Status: DC
  Administered 2016-01-25 – 2016-01-26 (×2): 20 mg via ORAL
  Filled 2016-01-25 (×2): qty 1

## 2016-01-25 MED ORDER — KCL IN DEXTROSE-NACL 20-5-0.45 MEQ/L-%-% IV SOLN
INTRAVENOUS | Status: DC
  Administered 2016-01-25 – 2016-01-26 (×2): via INTRAVENOUS
  Filled 2016-01-25 (×2): qty 1000

## 2016-01-25 MED ORDER — ALUMINUM HYDROXIDE GEL 320 MG/5ML PO SUSP
15.0000 mL | ORAL | Status: DC | PRN
  Filled 2016-01-25: qty 30

## 2016-01-25 MED ORDER — MONTELUKAST SODIUM 10 MG PO TABS
10.0000 mg | ORAL_TABLET | Freq: Every day | ORAL | Status: DC
  Administered 2016-01-25: 10 mg via ORAL
  Filled 2016-01-25: qty 1

## 2016-01-25 MED ORDER — CHLORHEXIDINE GLUCONATE 4 % EX LIQD: 60.0000 mL | Freq: Once | CUTANEOUS | Status: DC

## 2016-01-25 MED ORDER — OXYCODONE HCL 5 MG PO TABS
ORAL_TABLET | ORAL | Status: AC
  Filled 2016-01-25: qty 1

## 2016-01-25 MED ORDER — TRANEXAMIC ACID 1000 MG/10ML IV SOLN
1000.0000 mg | INTRAVENOUS | Status: AC
  Administered 2016-01-25: 1000 mg via INTRAVENOUS
  Filled 2016-01-25: qty 10

## 2016-01-25 MED ORDER — CEFUROXIME SODIUM 750 MG IJ SOLR
INTRAMUSCULAR | Status: AC
  Filled 2016-01-25: qty 1500

## 2016-01-25 MED ORDER — BUPIVACAINE IN DEXTROSE 0.75-8.25 % IT SOLN
INTRATHECAL | Status: DC | PRN
  Administered 2016-01-25: 1.8 mL via INTRATHECAL

## 2016-01-25 MED ORDER — PHENOL 1.4 % MT LIQD: 1.0000 | OROMUCOSAL | Status: DC | PRN

## 2016-01-25 MED ORDER — MENTHOL 3 MG MT LOZG: 1.0000 | LOZENGE | OROMUCOSAL | Status: DC | PRN

## 2016-01-25 MED ORDER — ONDANSETRON HCL 4 MG PO TABS: 4.0000 mg | ORAL_TABLET | Freq: Four times a day (QID) | ORAL | Status: DC | PRN

## 2016-01-25 MED ORDER — ACETAMINOPHEN 650 MG RE SUPP: 650.0000 mg | Freq: Four times a day (QID) | RECTAL | Status: DC | PRN

## 2016-01-25 MED ORDER — POLYETHYLENE GLYCOL 3350 17 G PO PACK: 17.0000 g | PACK | Freq: Every day | ORAL | Status: DC | PRN

## 2016-01-25 MED ORDER — BISACODYL 5 MG PO TBEC
5.0000 mg | DELAYED_RELEASE_TABLET | Freq: Every day | ORAL | Status: DC | PRN
Start: 2016-01-25 — End: 2016-01-26

## 2016-01-25 MED ORDER — FLEET ENEMA 7-19 GM/118ML RE ENEM: 1.0000 | ENEMA | Freq: Once | RECTAL | Status: DC | PRN

## 2016-01-25 MED ORDER — CLONAZEPAM 0.5 MG PO TABS
0.2500 mg | ORAL_TABLET | Freq: Every day | ORAL | Status: DC
  Administered 2016-01-25: 0.25 mg via ORAL
  Filled 2016-01-25: qty 1

## 2016-01-25 MED ORDER — HYDROMORPHONE HCL 1 MG/ML IJ SOLN: 0.2500 mg | INTRAMUSCULAR | Status: DC | PRN

## 2016-01-25 MED ORDER — PHENYLEPHRINE HCL 10 MG/ML IJ SOLN
10.0000 mg | INTRAVENOUS | Status: DC | PRN
  Administered 2016-01-25: 25 ug/min via INTRAVENOUS

## 2016-01-25 MED ORDER — PHENYLEPHRINE 40 MCG/ML (10ML) SYRINGE FOR IV PUSH (FOR BLOOD PRESSURE SUPPORT)
PREFILLED_SYRINGE | INTRAVENOUS | Status: DC | PRN
  Administered 2016-01-25: 80 ug via INTRAVENOUS
  Administered 2016-01-25: 40 ug via INTRAVENOUS

## 2016-01-25 MED ORDER — KETOROLAC TROMETHAMINE 30 MG/ML IJ SOLN: 30.0000 mg | Freq: Once | INTRAMUSCULAR | Status: DC

## 2016-01-25 MED ORDER — METHOCARBAMOL 500 MG PO TABS
500.0000 mg | ORAL_TABLET | Freq: Four times a day (QID) | ORAL | Status: DC | PRN
  Administered 2016-01-25 – 2016-01-26 (×4): 500 mg via ORAL
  Filled 2016-01-25 (×4): qty 1

## 2016-01-25 MED ORDER — HYDROMORPHONE HCL 1 MG/ML IJ SOLN
0.5000 mg | INTRAMUSCULAR | Status: DC | PRN
  Administered 2016-01-25: 1 mg via INTRAVENOUS
  Filled 2016-01-25: qty 1

## 2016-01-25 MED ORDER — BISOPROLOL-HYDROCHLOROTHIAZIDE 10-6.25 MG PO TABS
1.0000 | ORAL_TABLET | ORAL | Status: DC
  Administered 2016-01-26: 1 via ORAL
  Filled 2016-01-25: qty 1

## 2016-01-25 MED ORDER — OXYCODONE HCL 5 MG PO TABS
5.0000 mg | ORAL_TABLET | ORAL | Status: DC | PRN
  Administered 2016-01-25: 5 mg via ORAL
  Administered 2016-01-25 – 2016-01-26 (×6): 10 mg via ORAL
  Filled 2016-01-25 (×6): qty 2

## 2016-01-25 MED ORDER — TRANEXAMIC ACID 1000 MG/10ML IV SOLN
2000.0000 mg | Freq: Once | INTRAVENOUS | Status: AC
  Administered 2016-01-25: 2000 mg via TOPICAL
  Filled 2016-01-25: qty 20

## 2016-01-25 MED ORDER — MEPERIDINE HCL 25 MG/ML IJ SOLN: 6.2500 mg | INTRAMUSCULAR | Status: DC | PRN

## 2016-01-25 MED ORDER — DOCUSATE SODIUM 100 MG PO CAPS
100.0000 mg | ORAL_CAPSULE | Freq: Two times a day (BID) | ORAL | Status: DC
  Administered 2016-01-25 – 2016-01-26 (×3): 100 mg via ORAL
  Filled 2016-01-25 (×3): qty 1

## 2016-01-25 MED ORDER — ASPIRIN EC 325 MG PO TBEC
325.0000 mg | DELAYED_RELEASE_TABLET | Freq: Every day | ORAL | Status: DC
  Administered 2016-01-26: 325 mg via ORAL
  Filled 2016-01-25: qty 1

## 2016-01-25 MED ORDER — BUPIVACAINE-EPINEPHRINE 0.25% -1:200000 IJ SOLN
INTRAMUSCULAR | Status: DC | PRN
  Administered 2016-01-25: 50 mL

## 2016-01-25 MED ORDER — BUPIVACAINE-EPINEPHRINE (PF) 0.5% -1:200000 IJ SOLN
INTRAMUSCULAR | Status: AC
  Filled 2016-01-25: qty 60

## 2016-01-25 MED ORDER — EPHEDRINE SULFATE-NACL 50-0.9 MG/10ML-% IV SOSY
PREFILLED_SYRINGE | INTRAVENOUS | Status: DC | PRN
  Administered 2016-01-25: 5 mg via INTRAVENOUS

## 2016-01-25 MED ORDER — 0.9 % SODIUM CHLORIDE (POUR BTL) OPTIME
TOPICAL | Status: DC | PRN
  Administered 2016-01-25: 1000 mL

## 2016-01-25 MED ORDER — FENTANYL CITRATE (PF) 100 MCG/2ML IJ SOLN
INTRAMUSCULAR | Status: DC | PRN
  Administered 2016-01-25 (×2): 50 ug via INTRAVENOUS

## 2016-01-25 MED ORDER — BUPIVACAINE LIPOSOME 1.3 % IJ SUSP
20.0000 mL | Freq: Once | INTRAMUSCULAR | Status: AC
  Administered 2016-01-25: 20 mL
  Filled 2016-01-25: qty 20

## 2016-01-25 MED ORDER — METOCLOPRAMIDE HCL 5 MG/ML IJ SOLN: 5.0000 mg | Freq: Three times a day (TID) | INTRAMUSCULAR | Status: DC | PRN

## 2016-01-25 MED ORDER — DIPHENHYDRAMINE HCL 12.5 MG/5ML PO ELIX: 12.5000 mg | ORAL_SOLUTION | ORAL | Status: DC | PRN

## 2016-01-25 MED ORDER — MIDAZOLAM HCL 5 MG/5ML IJ SOLN
INTRAMUSCULAR | Status: DC | PRN
  Administered 2016-01-25 (×2): 1 mg via INTRAVENOUS

## 2016-01-25 MED ORDER — TRANEXAMIC ACID 1000 MG/10ML IV SOLN
1000.0000 mg | Freq: Once | INTRAVENOUS | Status: AC
  Administered 2016-01-25: 1000 mg via INTRAVENOUS
  Filled 2016-01-25: qty 10

## 2016-01-25 MED ORDER — PNEUMOCOCCAL VAC POLYVALENT 25 MCG/0.5ML IJ INJ
0.5000 mL | INJECTION | INTRAMUSCULAR | Status: AC
  Administered 2016-01-26: 0.5 mL via INTRAMUSCULAR
  Filled 2016-01-25: qty 0.5

## 2016-01-25 MED ORDER — PROMETHAZINE HCL 25 MG/ML IJ SOLN: 6.2500 mg | INTRAMUSCULAR | Status: DC | PRN

## 2016-01-25 MED ORDER — MIDAZOLAM HCL 2 MG/2ML IJ SOLN
INTRAMUSCULAR | Status: AC
  Filled 2016-01-25: qty 2

## 2016-01-25 MED ORDER — FENTANYL CITRATE (PF) 250 MCG/5ML IJ SOLN
INTRAMUSCULAR | Status: AC
  Filled 2016-01-25: qty 5

## 2016-01-25 SURGICAL SUPPLY — 56 items
BAG DECANTER FOR FLEXI CONT (MISCELLANEOUS) ×3 IMPLANT
BLADE SAW SGTL 18X1.27X75 (BLADE) ×2 IMPLANT
BLADE SAW SGTL 18X1.27X75MM (BLADE) ×1
BRUSH FEMORAL CANAL (MISCELLANEOUS) IMPLANT
CAPT HIP TOTAL 2 ×3 IMPLANT
COVER SURGICAL LIGHT HANDLE (MISCELLANEOUS) ×6 IMPLANT
DRAPE ORTHO SPLIT 77X108 STRL (DRAPES) ×2
DRAPE PROXIMA HALF (DRAPES) ×3 IMPLANT
DRAPE SURG ORHT 6 SPLT 77X108 (DRAPES) ×1 IMPLANT
DRAPE U-SHAPE 47X51 STRL (DRAPES) ×3 IMPLANT
DRILL BIT 7/64X5 (BIT) ×3 IMPLANT
DRSG AQUACEL AG ADV 3.5X10 (GAUZE/BANDAGES/DRESSINGS) ×3 IMPLANT
DRSG AQUACEL AG ADV 3.5X14 (GAUZE/BANDAGES/DRESSINGS) IMPLANT
DURAPREP 26ML APPLICATOR (WOUND CARE) ×3 IMPLANT
ELECT BLADE 4.0 EZ CLEAN MEGAD (MISCELLANEOUS)
ELECT CAUTERY BLADE 6.4 (BLADE) ×3 IMPLANT
ELECT REM PT RETURN 9FT ADLT (ELECTROSURGICAL) ×3
ELECTRODE BLDE 4.0 EZ CLN MEGD (MISCELLANEOUS) IMPLANT
ELECTRODE REM PT RTRN 9FT ADLT (ELECTROSURGICAL) ×1 IMPLANT
GLOVE BIO SURGEON STRL SZ7.5 (GLOVE) ×3 IMPLANT
GLOVE BIO SURGEON STRL SZ8.5 (GLOVE) ×6 IMPLANT
GLOVE BIOGEL PI IND STRL 8 (GLOVE) ×2 IMPLANT
GLOVE BIOGEL PI IND STRL 9 (GLOVE) ×1 IMPLANT
GLOVE BIOGEL PI INDICATOR 8 (GLOVE) ×4
GLOVE BIOGEL PI INDICATOR 9 (GLOVE) ×2
GOWN STRL REUS W/ TWL LRG LVL3 (GOWN DISPOSABLE) ×2 IMPLANT
GOWN STRL REUS W/ TWL XL LVL3 (GOWN DISPOSABLE) ×2 IMPLANT
GOWN STRL REUS W/TWL LRG LVL3 (GOWN DISPOSABLE) ×6
GOWN STRL REUS W/TWL XL LVL3 (GOWN DISPOSABLE) ×6
HANDPIECE INTERPULSE COAX TIP (DISPOSABLE)
HOOD PEEL AWAY FACE SHEILD DIS (HOOD) ×6 IMPLANT
KIT BASIN OR (CUSTOM PROCEDURE TRAY) ×3 IMPLANT
KIT ROOM TURNOVER OR (KITS) ×3 IMPLANT
MANIFOLD NEPTUNE II (INSTRUMENTS) ×3 IMPLANT
NEEDLE 22X1 1/2 (OR ONLY) (NEEDLE) ×3 IMPLANT
NS IRRIG 1000ML POUR BTL (IV SOLUTION) ×3 IMPLANT
PACK TOTAL JOINT (CUSTOM PROCEDURE TRAY) ×3 IMPLANT
PAD ARMBOARD 7.5X6 YLW CONV (MISCELLANEOUS) ×6 IMPLANT
PASSER SUT SWANSON 36MM LOOP (INSTRUMENTS) ×3 IMPLANT
PRESSURIZER FEMORAL UNIV (MISCELLANEOUS) IMPLANT
SET HNDPC FAN SPRY TIP SCT (DISPOSABLE) IMPLANT
SUT ETHIBOND 2 V 37 (SUTURE) ×3 IMPLANT
SUT VIC AB 0 CTX 36 (SUTURE) ×2
SUT VIC AB 0 CTX36XBRD ANTBCTR (SUTURE) ×1 IMPLANT
SUT VIC AB 1 CTX 36 (SUTURE) ×3
SUT VIC AB 1 CTX36XBRD ANBCTR (SUTURE) ×1 IMPLANT
SUT VIC AB 2-0 CTX 27 (SUTURE) ×6 IMPLANT
SUT VIC AB 3-0 CT1 27 (SUTURE) ×3
SUT VIC AB 3-0 CT1 TAPERPNT 27 (SUTURE) ×1 IMPLANT
SYR CONTROL 10ML LL (SYRINGE) ×3 IMPLANT
TOWEL OR 17X24 6PK STRL BLUE (TOWEL DISPOSABLE) ×3 IMPLANT
TOWEL OR 17X26 10 PK STRL BLUE (TOWEL DISPOSABLE) ×3 IMPLANT
TOWER CARTRIDGE SMART MIX (DISPOSABLE) IMPLANT
TRAY CATH 16FR W/PLASTIC CATH (SET/KITS/TRAYS/PACK) ×3 IMPLANT
TRAY FOLEY CATH 14FR (SET/KITS/TRAYS/PACK) IMPLANT
WATER STERILE IRR 1000ML POUR (IV SOLUTION) ×12 IMPLANT

## 2016-01-25 NOTE — Anesthesia Procedure Notes (Signed)
Spinal Patient location during procedure: OR Start time: 01/25/2016 10:07 AM End time: 01/25/2016 10:10 AM Staffing Anesthesiologist: Lyn Hollingshead Performed by: anesthesiologist  Preanesthetic Checklist Completed: patient identified, surgical consent, pre-op evaluation, timeout performed, IV checked, risks and benefits discussed and monitors and equipment checked Spinal Block Patient position: sitting Prep: site prepped and draped and DuraPrep Patient monitoring: heart rate, cardiac monitor, continuous pulse ox and blood pressure Approach: midline Location: L3-4 Injection technique: single-shot Needle Needle type: Pencan  Needle gauge: 24 G Needle length: 9 cm Needle insertion depth: 5 cm Assessment Sensory level: T10

## 2016-01-25 NOTE — Interval H&P Note (Signed)
History and Physical Interval Note:  01/25/2016 8:18 AM  [name]  has presented today for surgery, with the diagnosis of LEFT HIP OSTEOARTHRITIS  The various methods of treatment have been discussed with the patient and family. After consideration of risks, benefits and other options for treatment, the patient has consented to  Procedure(s): LEFT TOTAL HIP ARTHROPLASTY (Left) as a surgical intervention .  The patient's history has been reviewed, patient examined, no change in status, stable for surgery.  I have reviewed the patient's chart and labs.  Questions were answered to the patient's satisfaction.     Kerin Salen

## 2016-01-25 NOTE — Evaluation (Signed)
Physical Therapy Evaluation Patient Details Name: [name] MRN: [id number] DOB: [birth year] Today's Date: 01/25/2016   History of Present Illness  Pt presents for left posterior THA. PMH: DM, COPD, hepatitis, HTN,  right THA, right TKA  Clinical Impression  Pt is s/p THA resulting in the deficits listed below (see PT Problem List). Pt ambulated 15' with RW and min-guard A. Felt slightly nauseous after being up. Expect her to progress well with therapies.  Pt will benefit from skilled PT to increase their independence and safety with mobility to allow discharge to the venue listed below.      Follow Up Recommendations Home health PT;Supervision - Intermittent    Equipment Recommendations  None recommended by PT    Recommendations for Other Services       Precautions / Restrictions Precautions Precautions: Posterior Hip Precaution Booklet Issued: Yes (comment) Restrictions Weight Bearing Restrictions: Yes LLE Weight Bearing: Weight bearing as tolerated      Mobility  Bed Mobility Overal bed mobility: Needs Assistance Bed Mobility: Supine to Sit     Supine to sit: Min assist     General bed mobility comments: min A to LE's for off bed, vc's for sequencing and keeping precautions  Transfers Overall transfer level: Needs assistance Equipment used: Rolling walker (2 wheeled) Transfers: Sit to/from Stand Sit to Stand: Supervision         General transfer comment: supervision for safety, pt with safe hand placement, has a tendency to be impulsive, cues to wait until lines are ready to move  Ambulation/Gait Ambulation/Gait assistance: Min guard Ambulation Distance (Feet): 15 Feet Assistive device: Rolling walker (2 wheeled) Gait Pattern/deviations: Step-to pattern;Decreased weight shift to left;Decreased stance time - left;Decreased step length - left Gait velocity: decreased Gait velocity interpretation: Below normal speed for age/gender General Gait Details:  decreased weight LLE due to leg feeling asleep, pt with good UE strength to manage RW  Stairs            Wheelchair Mobility    Modified Rankin (Stroke Patients Only)       Balance Overall balance assessment: Needs assistance Sitting-balance support: No upper extremity supported;Feet supported Sitting balance-Leahy Scale: Good     Standing balance support: Single extremity supported Standing balance-Leahy Scale: Poor Standing balance comment: needs UE support for safety at this point                             Pertinent Vitals/Pain Pain Assessment: 0-10 Pain Score: 5  Pain Location: left hip Pain Descriptors / Indicators: Aching Pain Intervention(s): Limited activity within patient's tolerance;Monitored during session;Premedicated before session;Repositioned    Home Living Family/patient expects to be discharged to:: Private residence Living Arrangements: Spouse/significant other;Children Available Help at Discharge: Family;Available 24 hours/day Type of Home: House Home Access: Stairs to enter Entrance Stairs-Rails: None Entrance Stairs-Number of Steps: 2 Home Layout: Multi-level;Able to live on main level with bedroom/bathroom Home Equipment: Walker - 2 wheels Additional Comments: pt's daughter, husband, and niece will all be there at different times to help her    Prior Function Level of Independence: Independent               Hand Dominance        Extremity/Trunk Assessment   Upper Extremity Assessment: Overall WFL for tasks assessed           Lower Extremity Assessment: LLE deficits/detail   LLE Deficits / Details: hip flex 3/5, knee  flex/ ext 3/5, lacking full knee extension, reports that she needs TKA on left  Cervical / Trunk Assessment: Normal  Communication   Communication: No difficulties  Cognition Arousal/Alertness: Awake/alert Behavior During Therapy: WFL for tasks assessed/performed Overall Cognitive Status:  Within Functional Limits for tasks assessed                      General Comments      Exercises Total Joint Exercises Ankle Circles/Pumps: AROM;Both;10 reps;Seated Quad Sets: AROM;Both;10 reps;Seated Gluteal Sets: AROM;Both;10 reps;Seated Heel Slides: AROM;Left;5 reps;Seated Long Arc Quad: AROM;Left;10 reps;Seated      Assessment/Plan    PT Assessment Patient needs continued PT services  PT Diagnosis Difficulty walking;Abnormality of gait;Acute pain   PT Problem List Decreased strength;Decreased activity tolerance;Decreased range of motion;Decreased balance;Decreased mobility;Decreased knowledge of use of DME;Decreased knowledge of precautions;Pain  PT Treatment Interventions DME instruction;Gait training;Stair training;Functional mobility training;Therapeutic activities;Therapeutic exercise;Balance training;Patient/family education   PT Goals (Current goals can be found in the Care Plan section) Acute Rehab PT Goals Patient Stated Goal: return home PT Goal Formulation: With patient Time For Goal Achievement: 02/01/16 Potential to Achieve Goals: Good    Frequency 7X/week   Barriers to discharge        Co-evaluation               End of Session Equipment Utilized During Treatment: Gait belt Activity Tolerance: Patient tolerated treatment well Patient left: in chair;with call bell/phone within reach;with family/visitor present Nurse Communication: Mobility status         Time: [id number] PT Time Calculation (min) (ACUTE ONLY): 23 min   Charges:   PT Evaluation $PT Eval Low Complexity: 1 Procedure PT Treatments $Gait Training: 8-22 mins   PT G Codes:      Leighton Roach, PT  Acute Rehab Services  [place], [place] 01/25/2016, 4:05 PM

## 2016-01-25 NOTE — Transfer of Care (Signed)
Immediate Anesthesia Transfer of Care Note  Patient: [name]  Procedure(s) Performed: Procedure(s): LEFT TOTAL HIP ARTHROPLASTY (Left)  Patient Location: PACU  Anesthesia Type:Spinal  Level of Consciousness: awake, alert , oriented and patient cooperative  Airway & Oxygen Therapy: Patient Spontanous Breathing and Patient connected to nasal cannula oxygen  Post-op Assessment: Report given to RN and Post -op Vital signs reviewed and stable  Post vital signs: Reviewed and stable  Last Vitals:  Filed Vitals:   01/25/16 0821  BP: 185/67  Pulse: 64  Temp: 37 C  Resp: 16    Last Pain: There were no vitals filed for this visit.       Complications: No apparent anesthesia complications

## 2016-01-25 NOTE — Op Note (Signed)
PATIENT ID:      [name]  MRN:     [id number] DOB/AGE:    December 19, 1950 / 65 y.o.       OPERATIVE REPORT    DATE OF PROCEDURE:  01/25/2016       PREOPERATIVE DIAGNOSIS:  LEFT HIP OSTEOARTHRITIS                                                       Estimated body mass index is 26.19 kg/(m^2) as calculated from the following:   Height as of this encounter: 5\' 6"  (1.676 m).   Weight as of this encounter: 73.568 kg (162 lb 3 oz).     POSTOPERATIVE DIAGNOSIS:  LEFT HIP OSTEOARTHRITIS                                                           PROCEDURE:  L total hip arthroplasty using a 52 mm DePuy Pinnacle  Cup, [employer], 10-degree polyethylene liner index superior  and posterior, a +3 36 mm ceramic head, a 18x13x150x36 SROM Stem, 18DL Sleeve  SURGEON: Hermenegildo Clausen J    ASSISTANT:   Eric K. Barton Dubois  (present throughout entire procedure and necessary for timely completion of the procedure)  ANESTHESIA: Spinal  BLOOD LOSS: 300 FLUID REPLACEMENT: 1500 crystalloid Tranexamic Acid: 1gm iv, 2 gm topical DRAINS: None COMPLICATIONS: None    INDICATIONS FOR PROCEDURE:Patient with end-stage arthritis of the L hip.  X-rays show bone-on-bone arthritic changes, peri chondral cyts. Despite conservative measures with observation, anti-inflammatory medicine, narcotics, use of a cane, has severe unremitting pain and can ambulate only 3 blocks before resting.  Patient desires elective right total hip arthroplasty to decrease pain and increase function. The risks, benefits, and alternatives were discussed at length including but not limited to the risks of infection, bleeding, nerve injury, stiffness, blood clots, the need for revision surgery, cardiopulmonary complications, among others, and they were willing to proceed.Benefits have been discussed. Questions answered.     PROCEDURE IN DETAIL: The patient was identified by armband,  received preoperative IV antibiotics in the holding  area at [facility], taken to the operating room , appropriate anesthetic monitors  were attached and general endotracheal anesthesia induced. Foley catheter was inserted. Patient was rolled into the R lateral decubitus position and fixed there with a Stulberg Mark II pelvic clamp and the L lower extremity was then prepped and draped  in the usual sterile fashion from the ankle to the hemipelvis. A time-out  procedure was performed. The skin along the lateral hip and thigh  infiltrated with 10 mL of 0.5% Marcaine and epinephrine solution. We  then made a posterolateral approach to the hip. With a #10 blade, 12 cm  incision through skin and subcutaneous tissue down to the level of the  IT band. Small bleeders were identified and cauterized. IT band cut in  line with skin incision exposing the greater trochanter. A Cobra retractor was placed between the gluteus minimus and the superior hip joint capsule, and a spiked Cobra between the quadratus femoris and the inferior hip joint capsule. This isolated the short  external rotators and piriformis tendons. These were tagged with a #2 Ethibond  suture and cut off their insertion on the intertrochanteric crest. The posterior  capsule was then developed into an acetabular-based flap from Posterior Superior off of the acetabulum out over the femoral neck and back posterior inferior to the acetabular rim. This flap was tagged with two #2 Ethibond sutures and retracted protecting the sciatic nerve. This exposed the arthritic femoral head and osteophytes. The hip was then flexed and internally rotated, dislocating the femoral head and a standard neck cut performed 1 fingerbreadth above the lesser trochanter.  A spiked Cobra was placed in the cotyloid notch and a Hohmann retractor was then used to lever the femur anteriorly off of the anterior pelvic column. A posterior-inferior wing retractor was placed at the junction of the acetabulum and the ischium  completing the acetabular exposure.We then removed the peripheral osteophytes and labrum from the acetabulum. We then reamed the acetabulum up to 51 mm with basket reamers obtaining good coverage in all quadrants, irrigated out with normal  saline solution and hammered into place a 52 mm pinnacle cup in 45  degrees of abduction and about 20 degrees of anteversion. More  peripheral osteophytes removed, the apex hole eliminator was placed, and a 10-degree liner placed with the  IndexPS. The hip was then flexed and internally rotated exposing the  proximal femur, which was entered with the box cutting chisel, the initiating reamer followed by axial reaming up to 13.57mm full depth, and 14 partial depth. We then conically reamed up to 18DL. And milled the calcar to 18DL. We then placed the trial sleeve, and a trial stem. A trial reduction was then  performed with a +0  36-mm ball on the standard neck and  excellent stability was noted with at 90 of flexion with 75 of  internal rotation and then full extension withexternal rotation. The hip  could not be dislocated in full extension. The knee could easily flex  to about 140 degrees. We also stretched the abductors at this point,  because of the preexisting adductor contractures. All trial components  were then removed. The real sleeve was then hammered into place, through the sleeve we reamed with a 13.5 reamer to ensure that the stem did not become incarcerated. The stem itself was then inserted in [id number] in relation to the calcar.  At this point, a + 3 36-mm ceramic head was  hammered on the stem. The hip was reduced. We checked our stability  one more time and found to be excellent. The wound was once again  thoroughly irrigated out with normal saline solution pulse lavage. The  capsular flap and short external rotators were repaired back to the  intertrochanteric crest through drill holes with a #2 Ethibond suture.  The IT band was closed  with running 1 Vicryl suture. The subcutaneous  tissue with 0 and 2-0 undyed Vicryl suture and the skin with running  3-0 Vivryl SQ suture. Aquacil dessing was applied. The patient was then unclamped, rolled supine, awaken extubated and taken to recovery room without difficulty in stable condition.   Shan Padgett J 01/25/2016, 11:16 AM

## 2016-01-25 NOTE — Anesthesia Preprocedure Evaluation (Addendum)
Anesthesia Evaluation  Patient identified by MRN, date of birth, ID band  Reviewed: Allergy & Precautions, H&P , NPO status , Patient's Chart, lab work & pertinent test results, reviewed documented beta blocker date and time   Airway Mallampati: II  TM Distance: >3 FB Neck ROM: full    Dental no notable dental hx. (+) Teeth Intact   Pulmonary Current Smoker,    Pulmonary exam normal        Cardiovascular hypertension, Pt. on medications Normal cardiovascular exam     Neuro/Psych negative neurological ROS     GI/Hepatic negative GI ROS,   Endo/Other  negative endocrine ROSdiabetes  Renal/GU negative Renal ROS     Musculoskeletal   Abdominal Normal abdominal exam  (+)   Peds  Hematology negative hematology ROS (+)   Anesthesia Other Findings   Reproductive/Obstetrics negative OB ROS                            Anesthesia Physical Anesthesia Plan  ASA: II  Anesthesia Plan: Spinal   Post-op Pain Management:    Induction:   Airway Management Planned:   Additional Equipment:   Intra-op Plan:   Post-operative Plan:   Informed Consent: I have reviewed the patients History and Physical, chart, labs and discussed the procedure including the risks, benefits and alternatives for the proposed anesthesia with the patient or authorized representative who has indicated his/her understanding and acceptance.     Plan Discussed with: CRNA and Surgeon  Anesthesia Plan Comments:         Anesthesia Quick Evaluation

## 2016-01-26 ENCOUNTER — Encounter (HOSPITAL_COMMUNITY): Payer: Self-pay | Admitting: Orthopedic Surgery

## 2016-01-26 LAB — CBC
HCT: 35 % — ABNORMAL LOW (ref 36.0–46.0)
HEMOGLOBIN: 11.7 g/dL — AB (ref 12.0–15.0)
MCH: 26.8 pg (ref 26.0–34.0)
MCHC: 33.4 g/dL (ref 30.0–36.0)
MCV: 80.1 fL (ref 78.0–100.0)
PLATELETS: 186 10*3/uL (ref 150–400)
RBC: 4.37 MIL/uL (ref 3.87–5.11)
RDW: 14.1 % (ref 11.5–15.5)
WBC: 6.7 10*3/uL (ref 4.0–10.5)

## 2016-01-26 LAB — BASIC METABOLIC PANEL
Anion gap: 7 (ref 5–15)
BUN: 6 mg/dL (ref 6–20)
CALCIUM: 8.8 mg/dL — AB (ref 8.9–10.3)
CO2: 28 mmol/L (ref 22–32)
CREATININE: 0.61 mg/dL (ref 0.44–1.00)
Chloride: 100 mmol/L — ABNORMAL LOW (ref 101–111)
GFR calc Af Amer: >60 mL/min (ref >60)
Glucose, Bld: 122 mg/dL — ABNORMAL HIGH (ref 65–99)
POTASSIUM: 3.8 mmol/L (ref 3.5–5.1)
SODIUM: 135 mmol/L (ref 135–145)

## 2016-01-26 MED ORDER — ASPIRIN EC 325 MG PO TBEC: 325.0000 mg | DELAYED_RELEASE_TABLET | Freq: Two times a day (BID) | ORAL | Status: DC

## 2016-01-26 MED ORDER — METHOCARBAMOL 500 MG PO TABS: 500.0000 mg | ORAL_TABLET | Freq: Four times a day (QID) | ORAL | Status: DC

## 2016-01-26 MED ORDER — OXYCODONE-ACETAMINOPHEN 5-325 MG PO TABS: 1.0000 | ORAL_TABLET | ORAL | Status: DC | PRN

## 2016-01-26 NOTE — Anesthesia Postprocedure Evaluation (Signed)
Anesthesia Post Note  Patient: [name]  Procedure(s) Performed: Procedure(s) (LRB): LEFT TOTAL HIP ARTHROPLASTY (Left)  Patient location during evaluation: PACU Anesthesia Type: Spinal Level of consciousness: awake Pain management: pain level controlled Vital Signs Assessment: post-procedure vital signs reviewed and stable Respiratory status: spontaneous breathing Cardiovascular status: stable Postop Assessment: no headache, no backache, spinal receding, patient able to bend at knees and no signs of nausea or vomiting Anesthetic complications: no     Last Vitals:  Filed Vitals:   01/26/16 0025 01/26/16 0419  BP: 138/56 157/64  Pulse: 65 65  Temp: 37.2 C 37 C  Resp: 18 17    Last Pain:  Filed Vitals:   01/26/16 1230  PainSc: 7    Pain Goal:                 Berdella Bacot JR,JOHN Seniyah Esker

## 2016-01-26 NOTE — Care Management Note (Signed)
Case Management Note  Patient Details  Name: [name] MRN: [id number] Date of Birth: Nov 27, 1950  Subjective/Objective:   65 yr old female s/p left total hip arthroplasty.                 Action/Plan:  Case manager spoke with patient and family concerning home health and DME needs. Patient was preoperatively setup with [facility], no changes. Patient will have family support at discharge.    Expected Discharge Date:   01/26/16               Expected Discharge Plan:  [place]  In-House Referral:     Discharge planning Services  CM Consult  Post Acute Care Choice:  Durable Medical Equipment Choice offered to:  Patient  DME Arranged:  3-N-1 DME Agency:  [place]:  PT [facility] Agency:  [facility]  Status of Service:  Completed, signed off  If discussed at [place] of Stay Meetings, dates discussed:    Additional Comments:  Ninfa Meeker, RN 01/26/2016, 11:54 AM

## 2016-01-26 NOTE — Progress Notes (Signed)
Patient ID: [name], female   DOB: 1951/03/18, 65 y.o.   MRN: [id number] PATIENT ID: [name]  MRN: [id number]  DOB/AGE:  Feb 04, 1951 / 65 y.o.  1 Day Post-Op Procedure(s) (LRB): LEFT TOTAL HIP ARTHROPLASTY (Left)    PROGRESS NOTE Subjective: Patient is alert, oriented, no Nausea, no Vomiting, yes passing gas, . Taking PO well. Denies SOB, Chest or Calf Pain. Using Incentive Spirometer, PAS in place. Ambulate WBAT Patient reports pain as  3/10  .    Objective: Vital signs in last 24 hours: Filed Vitals:   01/25/16 1414 01/25/16 2023 01/26/16 0025 01/26/16 0419  BP: 169/69 172/61 138/56 157/64  Pulse: 60 61 65 65  Temp: 98.1 F (36.7 C) 98.2 F (36.8 C) 98.9 F (37.2 C) 98.6 F (37 C)  TempSrc: Oral     Resp: 16 17 18 17   Height:      Weight:      SpO2: 99% 99% 95% 95%      Intake/Output from previous day: I/O last 3 completed shifts: In: 3696.3 [P.O.:480; I.V.:3106.3; IV Piggyback:110] Out: 1600 [Urine:1500; Blood:100]   Intake/Output this shift:     LABORATORY DATA:  Recent Labs  01/25/16 0819  GLUCAP 93    Examination: Neurologically intact ABD soft Neurovascular intact Sensation intact distally Intact pulses distally Dorsiflexion/Plantar flexion intact Incision: scant drainage No cellulitis present Compartment soft} XR AP&Lat of hip shows well placed\fixed THA  Assessment:   1 Day Post-Op Procedure(s) (LRB): LEFT TOTAL HIP ARTHROPLASTY (Left) ADDITIONAL DIAGNOSIS:  Expected Acute Blood Loss Anemia, Hypertension  Plan: PT/OT WBAT, THA  DVT Prophylaxis: SCDx72 hrs, ASA 325 mg BID x 2 weeks  DISCHARGE PLAN: Home, today if passes PT  DISCHARGE NEEDS: HHPT, Walker and 3-in-1 comode seat

## 2016-01-26 NOTE — Discharge Summary (Signed)
Patient ID: [name] MRN: [id number] DOB/AGE: 1951/04/05 65 y.o.  Admit date: 01/25/2016 Discharge date: 01/26/2016  Admission Diagnoses:  Principal Problem:   Primary osteoarthritis of left hip   Discharge Diagnoses:  Same  Past Medical History  Diagnosis Date  . Hypertension   . Hyperlipidemia   . Anxiety   . Diabetes mellitus without complication ([facility])     controlled by diet  . Arthritis     osteoarthritis  . PONV (postoperative nausea and vomiting)   . Environmental allergies   . Urgency of urination     Takes oxybutynin; under control at this time  . Allergy     enviromental  . COPD (chronic obstructive pulmonary disease) ([facility])     mild  . Hepatitis     hepatitis A    Surgeries: Procedure(s): LEFT TOTAL HIP ARTHROPLASTY on 01/25/2016   Consultants:    Discharged Condition: Improved  Hospital Course: ERIENNE ALEXY is an 65 y.o. female who was admitted 01/25/2016 for operative treatment ofPrimary osteoarthritis of left hip. Patient has severe unremitting pain that affects sleep, daily activities, and work/hobbies. After pre-op clearance the patient was taken to the operating room on 01/25/2016 and underwent  Procedure(s): LEFT TOTAL HIP ARTHROPLASTY.    Patient was given perioperative antibiotics: Anti-infectives    Start     Dose/Rate Route Frequency Ordered Stop   01/25/16 1415  doxycycline (PERIOSTAT) tablet 20 mg  Status:  Discontinued     20 mg Oral 2 times daily 01/25/16 1406 01/26/16 1546   01/25/16 0930  ceFAZolin (ANCEF) IVPB 2g/100 mL premix     2 g 200 mL/hr over 30 Minutes Intravenous To [facility] 01/24/16 1025 01/25/16 1038       Patient was given sequential compression devices, early ambulation, and chemoprophylaxis to prevent DVT.  Patient benefited maximally from hospital stay and there were no complications.    Recent vital signs: Patient Vitals for the past 24 hrs:  BP Temp Pulse Resp SpO2  01/26/16 0419 (!) 157/64 mmHg  98.6 F (37 C) 65 17 95 %  01/26/16 0025 (!) 138/56 mmHg 98.9 F (37.2 C) 65 18 95 %  01/25/16 2023 (!) 172/61 mmHg 98.2 F (36.8 C) 61 17 99 %     Recent laboratory studies:  Recent Labs  01/26/16 0749  WBC 6.7  HGB 11.7*  HCT 35.0*  PLT 186  NA 135  K 3.8  CL 100*  CO2 28  BUN 6  CREATININE 0.61  GLUCOSE 122*  CALCIUM 8.8*     Discharge Medications:     Medication List    STOP taking these medications        aspirin 81 MG tablet  Replaced by:  aspirin EC 325 MG tablet      TAKE these medications        aspirin EC 325 MG tablet  Take 1 tablet (325 mg total) by mouth 2 (two) times daily.     atorvastatin 80 MG tablet  Commonly known as:  LIPITOR  Take 40 mg by mouth 3 (three) times a week.     bisoprolol-hydrochlorothiazide 10-6.25 MG tablet  Commonly known as:  ZIAC  Take 1 tablet by mouth every morning.     buPROPion 300 MG 24 hr tablet  Commonly known as:  WELLBUTRIN XL  Take 1 tablet (300 mg total) by mouth daily.     clonazePAM 0.5 MG tablet  Commonly known as:  KLONOPIN  TAKE 1/2-1 TABLET BY  MOUTH AT BEDTIME FOR SLEEP     cyclobenzaprine 10 MG tablet  Commonly known as:  FLEXERIL  Take 1 tablet (10 mg total) by mouth 3 (three) times daily as needed for muscle spasms.     doxycycline 20 MG tablet  Commonly known as:  PERIOSTAT  Take 20 mg by mouth 2 (two) times daily.     enalapril 20 MG tablet  Commonly known as:  VASOTEC  TAKE 1 TABLET BY MOUTH EVERY DAY     Fish Oil 1000 MG Caps  Take 1 capsule by mouth daily.     fluticasone 50 MCG/ACT nasal spray  Commonly known as:  FLONASE  Place 1 spray into both nostrils daily as needed for allergies or rhinitis.     meloxicam 15 MG tablet  Commonly known as:  MOBIC  Take 1 tablet (15 mg total) by mouth daily.     methocarbamol 500 MG tablet  Commonly known as:  ROBAXIN  Take 1 tablet (500 mg total) by mouth 4 (four) times daily.     montelukast 10 MG tablet  Commonly known as:   SINGULAIR  TAKE 1 TABLET(10 MG) BY MOUTH DAILY     MULTI COMPLETE PO  Take 1 capsule by mouth daily.     oxyCODONE-acetaminophen 5-325 MG tablet  Commonly known as:  ROXICET  Take 1-2 tablets by mouth every 4 (four) hours as needed.     traMADol 50 MG tablet  Commonly known as:  ULTRAM  Take 1 tablet (50 mg total) by mouth every 6 (six) hours as needed.     vitamin B-12 1000 MCG tablet  Commonly known as:  CYANOCOBALAMIN  Take 1,000 mcg by mouth daily.     Vitamin D3 5000 units Tabs  Take 5,000 Units by mouth daily.        Diagnostic Studies: Dg Chest 2 View  01/11/2016  CLINICAL DATA:  Preoperative respiratory evaluation for hip replacement. EXAM: CHEST  2 VIEW COMPARISON:  10/05/2015.  05/28/2012. FINDINGS: Lungs are hyperexpanded. The lungs are clear wiithout focal pneumonia, edema, pneumothorax or pleural effusion. Nodular density in the right apex between the posterior right fourth and fifth ribs is unchanged since the 2013 exam, consistent with benign process. The cardiopericardial silhouette is within normal limits for size. Degenerative changes noted left shoulder. IMPRESSION: Hyperexpansion without acute findings. Electronically Signed   By: Misty Stanley M.D.   On: 01/11/2016 16:10   Dg Pelvis Portable  01/25/2016  CLINICAL DATA:  Left hip arthroplasty EXAM: PORTABLE PELVIS 1-2 VIEWS COMPARISON:  None. FINDINGS: Interval total left hip arthroplasty without failure complication. Postsurgical changes in the surrounding soft tissues. Prior right total hip arthroplasty without failure complication. IMPRESSION: Interval total left hip arthroplasty. Electronically Signed   By: Kathreen Devoid   On: 01/25/2016 20:13    Disposition: 01-Home or Self Care      Discharge Instructions    Call MD / Call 911    Complete by:  As directed   If you experience chest pain or shortness of breath, CALL 911 and be transported to the hospital emergency room.  If you develope a fever above 101 F,  pus (white drainage) or increased drainage or redness at the wound, or calf pain, call your surgeon's office.     Change dressing    Complete by:  As directed   PRN     Constipation Prevention    Complete by:  As directed   Drink plenty of fluids.  Prune juice may be helpful.  You may use a stool softener, such as Colace (over the counter) 100 mg twice a day.  Use MiraLax (over the counter) for constipation as needed.     Diet - low sodium heart healthy    Complete by:  As directed      Driving restrictions    Complete by:  As directed   No driving for 2  weeks     Follow the hip precautions as taught in Physical Therapy    Complete by:  As directed      Increase activity slowly as tolerated    Complete by:  As directed            Follow-up Information    Follow up with Kerin Salen, MD In 2 weeks.   Specialty:  Orthopedic Surgery   Contact information:   [place] [address] [phone]       Follow up with [facility].   Why:  Someone from [facility] will contact you to arrange start date and time for therapy   Contact information:   [address] [place]  [address] [phone]        Signed: Kerin Salen 01/26/2016, 5:56 PM

## 2016-01-26 NOTE — Progress Notes (Signed)
Occupational Therapy Evaluation/Discharge Patient Details Name: [name] MRN: [id number] DOB: 01/19/1951 Today's Date: 01/26/2016    History of Present Illness Pt presents for left posterior THA. PMH: DM, COPD, hepatitis, HTN,  right THA, right TKA   Clinical Impression   PTA, pt was independent with ADLs and mobility. Pt currently requires min assist for LB ADLs and supervision assist for basic transfers. Reviewed posterior hip precautions and pt demonstrated good adherence during ADL tasks. Also educated on use of AE for LB ADLs and fall prevention. Pt plans to d/c home with 24/7 assistance from various family members. Recommend 3in1 for home use, no OT follow up. All education has been completed and pt has no further questions. Pt with no further acute OT needs. OT signing off.     Follow Up Recommendations  No OT follow up;Supervision/Assistance - 24 hour    Equipment Recommendations  3 in 1 bedside comode    Recommendations for Other Services       Precautions / Restrictions Precautions Precautions: Posterior Hip Precaution Booklet Issued: Yes (comment) Precaution Comments: Pt able recall 2/3 precautios at beginning of session. Restrictions Weight Bearing Restrictions: Yes LLE Weight Bearing: Weight bearing as tolerated      Mobility Bed Mobility Overal bed mobility: Modified Independent Bed Mobility: Supine to Sit     Supine to sit: Modified independent (Device/Increase time)     General bed mobility comments: Pt up in chair on OT arrival  Transfers Overall transfer level: Needs assistance Equipment used: Rolling walker (2 wheeled) Transfers: Sit to/from Stand Sit to Stand: Supervision         General transfer comment: Supervision form safety. Pt less impulsive than previous therapy sessions ann demonstrated safe hand placement.    Balance Overall balance assessment: Needs assistance Sitting-balance support: No upper extremity supported;Feet  supported Sitting balance-Leahy Scale: Good     Standing balance support: Bilateral upper extremity supported;During functional activity Standing balance-Leahy Scale: Poor Standing balance comment: reliant on UE support to ammaintain balance                            ADL Overall ADL's : Needs assistance/impaired     Grooming: Oral care;Wash/dry hands;Wash/dry face;Supervision/safety;Standing   Upper Body Bathing: Supervision/ safety;Sitting   Lower Body Bathing: Supervison/ safety;Sit to/from stand;Cueing for compensatory techniques;Adhering to hip precautions Lower Body Bathing Details (indicate cue type and reason): educated on use of long handled sponge Upper Body Dressing : Supervision/safety;Sitting   Lower Body Dressing: Minimal assistance;Sit to/from stand;Cueing for compensatory techniques Lower Body Dressing Details (indicate cue type and reason): husband can assist; educated on availability of reacher and sock aid Toilet Transfer: Supervision/safety;Ambulation;BSC;RW   Toileting- Clothing Manipulation and Hygiene: Supervision/safety;Sit to/from stand   Tub/ Shower Transfer: Supervision/safety;Cueing for safety;Ambulation;Rolling walker Tub/Shower Transfer Details (indicate cue type and reason): advised pt to have supervision getting in/out of shower  Functional mobility during ADLs: Supervision/safety;Rolling walker General ADL Comments: Reviewed posterior hip precautions and compensatorty strategies for LB ADLs. No family present for  OT eval.     Vision Vision Assessment?: No apparent visual deficits   Perception     Praxis      Pertinent Vitals/Pain Pain Assessment: 0-10 Pain Score: 3  Pain Location: L hip Pain Descriptors / Indicators: Aching Pain Intervention(s): Limited activity within patient's tolerance;Monitored during session;Premedicated before session;Repositioned     Hand Dominance Right   Extremity/Trunk Assessment Upper Extremity  Assessment Upper Extremity Assessment:  Overall WFL for tasks assessed   Lower Extremity Assessment Lower Extremity Assessment: LLE deficits/detail LLE Deficits / Details: decreased ROM and strength as expected post op   Cervical / Trunk Assessment Cervical / Trunk Assessment: Normal   Communication Communication Communication: No difficulties   Cognition Arousal/Alertness: Awake/alert Behavior During Therapy: WFL for tasks assessed/performed Overall Cognitive Status: Within Functional Limits for tasks assessed       Memory: Decreased recall of precautions             General Comments       Exercises       Shoulder Instructions      Home Living Family/patient expects to be discharged to:: Private residence Living Arrangements: Spouse/significant other Available Help at Discharge: Family;Available 24 hours/day Type of Home: House Home Access: Stairs to enter [employer] of Steps: 2 Entrance Stairs-Rails: None Home Layout: Multi-level;Able to live on main level with bedroom/bathroom Alternate Level Stairs-Number of Steps: flight   Bathroom Shower/Tub: Tub/shower unit Shower/tub characteristics: Curtain Bathroom Toilet: Handicapped height     Home Equipment: Environmental consultant - 2 wheels;Grab bars - tub/shower;Hand held shower head;Shower seat   Additional Comments: pt's daughter, husband, and niece will all be there at different times to help her      Prior Functioning/Environment Level of Independence: Independent             OT Diagnosis: Acute pain   OT Problem List: Decreased strength;Decreased range of motion;Decreased activity tolerance;Impaired balance (sitting and/or standing);Decreased safety awareness;Decreased knowledge of use of DME or AE;Decreased knowledge of precautions;Pain   OT Treatment/Interventions:      OT Goals(Current goals can be found in the care plan section) Acute Rehab OT Goals Patient Stated Goal: return home OT Goal  Formulation: With patient Time For Goal Achievement: 02/09/16 Potential to Achieve Goals: Good  OT Frequency:     Barriers to D/C:            Co-evaluation              End of Session Equipment Utilized During Treatment: Rolling walker Nurse Communication: Mobility status;Other (comment) (Pt with emesis during session)  Activity Tolerance: Patient tolerated treatment well Patient left: in chair;with call bell/phone within reach   Time: 0952-1015 OT Time Calculation (min): 23 min Charges:  OT General Charges $OT Visit: 1 Procedure OT Evaluation $OT Eval Moderate Complexity: 1 Procedure OT Treatments $Self Care/Home Management : 8-22 mins G-Codes:    Redmond Baseman, OTR/L Pager: [phone] 01/26/2016, 10:22 AM

## 2016-01-26 NOTE — Discharge Instructions (Signed)

## 2016-01-26 NOTE — Progress Notes (Signed)
Physical Therapy Treatment Patient Details Name: [name] MRN: [id number] DOB: [birth year] Today's Date: 01/26/2016    History of Present Illness Pt presents for left posterior THA. PMH: DM, COPD, hepatitis, HTN,  right THA, right TKA    PT Comments    Pt performed stair negotiation and review of HEP in prep for d/c home today.  From a mobility stand point patient ready to d/c.  Will continue PT per POC until d/c.   Follow Up Recommendations  Home health PT;Supervision - Intermittent     Equipment Recommendations  None recommended by PT    Recommendations for Other Services       Precautions / Restrictions Precautions Precautions: Posterior Hip Precaution Booklet Issued: Yes (comment) Restrictions Weight Bearing Restrictions: Yes LLE Weight Bearing: Weight bearing as tolerated    Mobility  Bed Mobility Overal bed mobility: Modified Independent Bed Mobility: Supine to Sit     Supine to sit: Modified independent (Device/Increase time)     General bed mobility comments: Pt impulsive but demonstrated good technique.  Pt noted sitting edge of bed attempting to cross legs to fix her socks.  pt re-educated on hip precautions.    Transfers Overall transfer level: Needs assistance Equipment used: Rolling walker (2 wheeled) Transfers: Sit to/from Stand Sit to Stand: Supervision         General transfer comment: Cues for hand placement and advancement of LLE to maintain hips precautions.  Pt performed transfer from edge of bed and 3:1 commode.    Ambulation/Gait Ambulation/Gait assistance: Supervision Ambulation Distance (Feet): 300 Feet Assistive device: Rolling walker (2 wheeled) Gait Pattern/deviations: Step-through pattern;Antalgic;Trunk flexed Gait velocity: decreased   General Gait Details: Cues for progression to step through pattern, cues for gait symmetry and upper trunk control.     Stairs Stairs: Yes Stairs assistance: Supervision Stair  Management: Two rails Number of Stairs: 2 General stair comments: Cues for sequencing and hand placement.  pt educated on RW positioning and assist from family with RW to improve ease post stair negotiation.    Wheelchair Mobility    Modified Rankin (Stroke Patients Only)       Balance Overall balance assessment: Needs assistance   Sitting balance-Leahy Scale: Good       Standing balance-Leahy Scale: Poor                      Cognition Arousal/Alertness: Awake/alert Behavior During Therapy: WFL for tasks assessed/performed Overall Cognitive Status: Within Functional Limits for tasks assessed                      Exercises Total Joint Exercises Ankle Circles/Pumps: AROM;Both;10 reps;Supine Quad Sets: AROM;10 reps;Left;Supine Short Arc Quad: AROM;Left;10 reps;Supine Heel Slides: AROM;Left;10 reps;Supine Hip ABduction/ADduction: AROM;Left;20 reps;Standing;Supine (1x10 standing and 1x10 supine) Long Arc Quad: AROM;Left;10 reps;Seated Knee Flexion: AROM;Left;10 reps;Standing Marching in Standing: AROM;Left;Standing;10 reps Standing Hip Extension: AROM;Left;10 reps;Standing    General Comments        Pertinent Vitals/Pain Pain Assessment: 0-10 Pain Score: 3  Pain Descriptors / Indicators: Discomfort;Grimacing;Guarding;Tightness Pain Intervention(s): Monitored during session;Repositioned    Home Living                      Prior Function            PT Goals (current goals can now be found in the care plan section) Acute Rehab PT Goals Patient Stated Goal: return home Potential to Achieve Goals: Good Progress towards  PT goals: Progressing toward goals    Frequency  7X/week    PT Plan Current plan remains appropriate    Co-evaluation             End of Session Equipment Utilized During Treatment: Gait belt Activity Tolerance: Patient tolerated treatment well Patient left: in chair;with call bell/phone within reach;with  family/visitor present     Time: 0920-0948 PT Time Calculation (min) (ACUTE ONLY): 28 min  Charges:  $Gait Training: 8-22 mins $Therapeutic Exercise: 8-22 mins                    G Codes:      Cristela Blue 01/27/16, 10:02 AM  Governor Rooks, PTA pager [phone]

## 2016-01-26 NOTE — Progress Notes (Signed)
Went over discharge papers and medications with pt and husband with full understanding

## 2016-04-23 ENCOUNTER — Ambulatory Visit: Payer: Self-pay | Admitting: Internal Medicine

## 2016-05-25 DIAGNOSIS — Z01419 Encounter for gynecological examination (general) (routine) without abnormal findings: Secondary | ICD-10-CM | POA: Diagnosis not present

## 2016-05-25 DIAGNOSIS — Z1231 Encounter for screening mammogram for malignant neoplasm of breast: Secondary | ICD-10-CM | POA: Diagnosis not present

## 2016-05-25 DIAGNOSIS — Z6826 Body mass index (BMI) 26.0-26.9, adult: Secondary | ICD-10-CM | POA: Diagnosis not present

## 2016-05-25 LAB — HM MAMMOGRAPHY

## 2016-06-04 ENCOUNTER — Other Ambulatory Visit: Payer: Self-pay | Admitting: Orthopedic Surgery

## 2016-06-05 ENCOUNTER — Ambulatory Visit (INDEPENDENT_AMBULATORY_CARE_PROVIDER_SITE_OTHER): Payer: PPO | Admitting: Internal Medicine

## 2016-06-05 ENCOUNTER — Encounter: Payer: Self-pay | Admitting: Internal Medicine

## 2016-06-05 VITALS — BP 162/80 | HR 64 | Temp 98.4°F | Resp 16 | Ht 66.0 in | Wt 162.0 lb

## 2016-06-05 DIAGNOSIS — R7303 Prediabetes: Secondary | ICD-10-CM | POA: Diagnosis not present

## 2016-06-05 DIAGNOSIS — E559 Vitamin D deficiency, unspecified: Secondary | ICD-10-CM | POA: Diagnosis not present

## 2016-06-05 DIAGNOSIS — F5101 Primary insomnia: Secondary | ICD-10-CM | POA: Diagnosis not present

## 2016-06-05 DIAGNOSIS — E782 Mixed hyperlipidemia: Secondary | ICD-10-CM | POA: Diagnosis not present

## 2016-06-05 DIAGNOSIS — Z79899 Other long term (current) drug therapy: Secondary | ICD-10-CM

## 2016-06-05 DIAGNOSIS — I10 Essential (primary) hypertension: Secondary | ICD-10-CM

## 2016-06-05 LAB — CBC WITH DIFFERENTIAL/PLATELET
BASOS PCT: 1 %
Basophils Absolute: 52 cells/uL (ref 0–200)
EOS ABS: 156 {cells}/uL (ref 15–500)
Eosinophils Relative: 3 %
HEMATOCRIT: 41.9 % (ref 35.0–45.0)
Hemoglobin: 13.9 g/dL (ref 11.7–15.5)
Lymphocytes Relative: 44 %
Lymphs Abs: 2288 cells/uL (ref 850–3900)
MCH: 27.1 pg (ref 27.0–33.0)
MCHC: 33.2 g/dL (ref 32.0–36.0)
MCV: 81.7 fL (ref 80.0–100.0)
MONO ABS: 416 {cells}/uL (ref 200–950)
MPV: 10.1 fL (ref 7.5–12.5)
Monocytes Relative: 8 %
NEUTROS ABS: 2288 {cells}/uL (ref 1500–7800)
Neutrophils Relative %: 44 %
PLATELETS: 212 10*3/uL (ref 140–400)
RBC: 5.13 MIL/uL — ABNORMAL HIGH (ref 3.80–5.10)
RDW: 15.1 % — ABNORMAL HIGH (ref 11.0–15.0)
WBC: 5.2 10*3/uL (ref 3.8–10.8)

## 2016-06-05 LAB — HEPATIC FUNCTION PANEL
ALBUMIN: 4.4 g/dL (ref 3.6–5.1)
ALK PHOS: 69 U/L (ref 33–130)
ALT: 10 U/L (ref 6–29)
AST: 18 U/L (ref 10–35)
BILIRUBIN TOTAL: 0.5 mg/dL (ref 0.2–1.2)
Bilirubin, Direct: 0.1 mg/dL (ref <=0.2)
Indirect Bilirubin: 0.4 mg/dL (ref 0.2–1.2)
Total Protein: 6.9 g/dL (ref 6.1–8.1)

## 2016-06-05 LAB — TSH: TSH: 0.87 m[IU]/L

## 2016-06-05 LAB — BASIC METABOLIC PANEL WITH GFR
BUN: 14 mg/dL (ref 7–25)
CHLORIDE: 101 mmol/L (ref 98–110)
CO2: 26 mmol/L (ref 20–31)
Calcium: 9.3 mg/dL (ref 8.6–10.4)
Creat: 0.81 mg/dL (ref 0.50–0.99)
GFR, Est African American: 88 mL/min (ref >=60)
GFR, Est Non African American: 76 mL/min (ref >=60)
GLUCOSE: 90 mg/dL (ref 65–99)
POTASSIUM: 4 mmol/L (ref 3.5–5.3)
Sodium: 139 mmol/L (ref 135–146)

## 2016-06-05 LAB — HEMOGLOBIN A1C
Hgb A1c MFr Bld: 5.3 % (ref <5.7)
Mean Plasma Glucose: 105 mg/dL

## 2016-06-05 LAB — MAGNESIUM: Magnesium: 2 mg/dL (ref 1.5–2.5)

## 2016-06-05 LAB — LIPID PANEL
Cholesterol: 225 mg/dL — ABNORMAL HIGH (ref <200)
HDL: 99 mg/dL (ref >50)
LDL CALC: 108 mg/dL — AB (ref <100)
Total CHOL/HDL Ratio: 2.3 Ratio (ref <5.0)
Triglycerides: 90 mg/dL (ref <150)
VLDL: 18 mg/dL (ref <30)

## 2016-06-05 MED ORDER — FLUTICASONE PROPIONATE 50 MCG/ACT NA SUSP: NASAL | 1 refills | Status: DC

## 2016-06-05 MED ORDER — CLONAZEPAM 0.5 MG PO TABS: ORAL_TABLET | ORAL | 1 refills | Status: DC

## 2016-06-05 NOTE — Patient Instructions (Signed)

## 2016-06-05 NOTE — Progress Notes (Signed)
[facility] Unk Pinto, M.D.        Uvaldo Bristle. Silverio Lay, P.A.-C       Starlyn Skeans, P.A.-C  [facility]                [address] [place], [place] [id number] Telephone [phone] Telefax [phone] ______________________________________________________________________     This very nice 65 y.o.  M [place] presents for 6 month follow up with Hypertension, Hyperlipidemia, Pre-Diabetes and Vitamin D Deficiency. Patient relate her husband was recently dx'd with Lung cancer and "heart blockages" . Patient is s/p L TKR in 2014 and is scheduled for Left THR next month by Dr Mayer Camel and is considered average surgical risk.      Patient is treated for HTN (1988) & BP has been controlled at home. Today's BP is 162/80 and rechecked at 142/80. Patient has had no complaints of any cardiac type chest pain, palpitations, dyspnea/orthopnea/PND, dizziness, claudication, or dependent edema.     Hyperlipidemia is controlled with diet & meds. Patient denies myalgias or other med SE's. Last Lipids were much improved on Lipitor and almost to goal with better compliance:  Lab Results  Component Value Date   CHOL 204 (H) 10/05/2015   HDL 84 10/05/2015   LDLCALC 105 10/05/2015   TRIG 77 10/05/2015   CHOLHDL 2.4 10/05/2015      Also, the patient has history of PreDiabetes  A1c 5.7% in Jan 2017) and has had no symptoms of reactive hypoglycemia, diabetic polys, paresthesias or visual blurring.  Last A1c was at goal: Lab Results  Component Value Date   HGBA1C 5.3 01/11/2016      Further, the patient also has history of Vitamin D Deficiency and supplements vitamin D without any suspected side-effects. Last vitamin D was still low and not ay goal (70-100): Lab Results  Component Value Date   VD25OH 56 10/05/2015   Current Outpatient Prescriptions on File Prior to Visit  Medication Sig  . aspirin EC 325  MG tablet Take 1 tablet (325 mg total) by mouth 2 (two) times daily.  Marland Kitchen aspirin EC 81 MG tablet Take 81 mg by mouth daily.  Marland Kitchen atorvastatin (LIPITOR) 80 MG tablet Take 40 mg by mouth 3 (three) times a week.  . bisoprolol-hydrochlorothiazide (ZIAC) 10-6.25 MG tablet Take 1 tablet by mouth every morning.  Marland Kitchen buPROPion (WELLBUTRIN XL) 300 MG 24 hr tablet Take 1 tablet (300 mg total) by mouth daily.  . Cholecalciferol (VITAMIN D3) 5000 UNITS TABS Take 5,000 Units by mouth daily.  . clonazePAM (KLONOPIN) 0.5 MG tablet TAKE 1/2-1 TABLET BY MOUTH AT BEDTIME FOR SLEEP  . doxycycline (PERIOSTAT) 20 MG tablet Take 20 mg by mouth daily.   . enalapril (VASOTEC) 20 MG tablet TAKE 1 TABLET BY MOUTH EVERY DAY  . fluticasone (FLONASE) 50 MCG/ACT nasal spray Place 1 spray into both nostrils daily as needed for allergies or rhinitis.  Marland Kitchen lidocaine (LIDODERM) 5 % Place 1 patch onto the skin daily as needed (pain). Remove & Discard patch within 12 hours or as directed by MD  . meloxicam (MOBIC) 15 MG tablet Take 1 tablet (15 mg total) by mouth daily.  . methocarbamol (ROBAXIN) 500 MG tablet Take 1 tablet (500 mg total) by mouth 4 (four) times daily.  . montelukast (SINGULAIR) 10 MG tablet TAKE 1 TABLET(10 MG) BY MOUTH DAILY  .  Multiple Vitamins-Minerals (MULTI COMPLETE PO) Take 1 capsule by mouth daily.  . Omega-3 Fatty Acids (FISH OIL) 1000 MG CAPS Take 1 capsule by mouth daily.  . vitamin B-12 (CYANOCOBALAMIN) 1000 MCG tablet Take 1,000 mcg by mouth daily.   No current facility-administered medications on file prior to visit.    No Known Allergies PMHx:   Past Medical History:  Diagnosis Date  . Allergy    enviromental  . Anxiety   . Arthritis    osteoarthritis  . COPD (chronic obstructive pulmonary disease) (HCC)    mild  . Diabetes mellitus without complication ([place])    controlled by diet  . Environmental allergies   . Hepatitis    hepatitis A  . Hyperlipidemia   . Hypertension   . PONV  (postoperative nausea and vomiting)   . Urgency of urination    Takes oxybutynin; under control at this time   Immunization History  Administered Date(s) Administered  . PPD Test 09/21/2014  . Pneumococcal Polysaccharide-23 02/07/2011, 01/26/2016  . Tdap 02/07/2011   Past Surgical History:  Procedure Laterality Date  . APPENDECTOMY     removed as child  . BACK SURGERY     lumbar fusion  . COLONOSCOPY    . DILATION AND CURETTAGE OF UTERUS    . LUMBAR FUSION  02/06/2012   L 4  L5  . PARTIAL HYSTERECTOMY    . TOTAL HIP ARTHROPLASTY  06/02/2012   Procedure: TOTAL HIP ARTHROPLASTY;  Surgeon: Kerin Salen, MD;  Location: [facility];  Service: Orthopedics;  Laterality: Right;  . TOTAL HIP ARTHROPLASTY Left 01/25/2016  . TOTAL HIP ARTHROPLASTY Left 01/25/2016   Procedure: LEFT TOTAL HIP ARTHROPLASTY;  Surgeon: Frederik Pear, MD;  Location: [facility];  Service: Orthopedics;  Laterality: Left;  . TOTAL KNEE ARTHROPLASTY Right 12/15/2012   Procedure: TOTAL KNEE ARTHROPLASTY;  Surgeon: Kerin Salen, MD;  Location: [facility];  Service: Orthopedics;  Laterality: Right;   FHx:    Reviewed / unchanged  SHx:    Reviewed / unchanged  Systems Review:  Constitutional: Denies fever, chills, wt changes, headaches, insomnia, fatigue, night sweats, change in appetite. Eyes: Denies redness, blurred vision, diplopia, discharge, itchy, watery eyes.  ENT: Denies discharge, congestion, post nasal drip, epistaxis, sore throat, earache, hearing loss, dental pain, tinnitus, vertigo, sinus pain, snoring.  CV: Denies chest pain, palpitations, irregular heartbeat, syncope, dyspnea, diaphoresis, orthopnea, PND, claudication or edema. Respiratory: denies cough, dyspnea, DOE, pleurisy, hoarseness, laryngitis, wheezing.  Gastrointestinal: Denies dysphagia, odynophagia, heartburn, reflux, water brash, abdominal pain or cramps, nausea, vomiting, bloating, diarrhea, constipation, hematemesis, melena, hematochezia  or  hemorrhoids. Genitourinary: Denies dysuria, frequency, urgency, nocturia, hesitancy, discharge, hematuria or flank pain. Musculoskeletal: c/o pain Lt hip exascerbated with activities as walking  Skin: Denies pruritus, rash, hives, warts, acne, eczema or change in skin lesion(s). Neuro: No weakness, tremor, incoordination, spasms, paresthesia or pain. Psychiatric: Denies confusion, memory loss or sensory loss. Endo: Denies change in weight, skin or hair change.  Heme/Lymph: No excessive bleeding, bruising or enlarged lymph nodes.  Physical Exam BP 162/80 -> 142/80   P 64   T 98.4 F   Resp 16   Ht 5\' 6"    Wt 162 lb  BMI 26.15   Appears well nourished and in no distress.  Eyes: PERRLA, EOMs, conjunctiva no swelling or erythema. Sinuses: No frontal/maxillary tenderness ENT/Mouth: EAC's clear, TM's nl w/o erythema, bulging. Nares clear w/o erythema, swelling, exudates. Oropharynx clear without erythema or exudates. Oral hygiene is good.  Tongue normal, non obstructing. Hearing intact.  Neck: Supple. Thyroid nl. Car 2+/2+ without bruits, nodes or JVD. Chest: Respirations nl with BS clear & equal w/o rales, rhonchi, wheezing or stridor.  Cor: Heart sounds normal w/ regular rate and rhythm without sig. murmurs, gallops, clicks, or rubs. Peripheral pulses normal and equal  without edema.  Lymphatics: Unremarkable.  Musculoskeletal: Full ROM all peripheral extremities, joint stability, 5/5 strength and sl limping gait.  Skin: Warm, dry without exposed rashes, lesions or ecchymosis apparent.  Neuro: Cranial nerves intact, reflexes equal bilaterally. Sensory-motor testing grossly intact. Tendon reflexes grossly intact.  Pysch: Alert & oriented x 3.  Insight and judgement nl & appropriate. No ideations.  Assessment and Plan:   1. Essential hypertension  - Continue medication, monitor blood pressure at home.  - Continue DASH diet. Reminder to go to the ER if any CP,  SOB, nausea, dizziness,  severe HA, changes vision/speech,  left arm numbness and tingling and jaw pain. - TSH  2. Mixed hyperlipidemia  - Continue diet/meds, exercise,& lifestyle modifications.  Continue monitor periodic cholesterol/liver & renal functions - Lipid panel - TSH  3. Prediabetes  - Continue diet, exercise, lifestyle modifications.  - Monitor appropriate labs. - Hemoglobin A1c - Insulin, random  4. Vitamin D deficiency  - Continue supplementation. - VITAMIN D 25 Hydroxy   5. Medication management  - CBC with Differential/Platelet - BASIC METABOLIC PANEL WITH GFR - Hepatic function panel - Magnesium  6. Primary insomnia  - clonazePAM  0.5 MG tablet; TAKE 1/2-1 TABAT BEDTIME FOR SLEEP         Recommended regular exercise, BP monitoring, weight control, and discussed med and SE's. Recommended labs to assess and monitor clinical status. Further disposition pending results of labs. Over 30 minutes of exam, counseling, chart review was performed

## 2016-06-06 LAB — VITAMIN D 25 HYDROXY (VIT D DEFICIENCY, FRACTURES): Vit D, 25-Hydroxy: 72 ng/mL (ref 30–100)

## 2016-06-06 LAB — INSULIN, RANDOM: INSULIN: 5.8 u[IU]/mL (ref 2.0–19.6)

## 2016-06-11 ENCOUNTER — Inpatient Hospital Stay (HOSPITAL_COMMUNITY)
Admission: RE | Admit: 2016-06-11 | Discharge: 2016-06-11 | Disposition: A | Discharge status: Home / Self Care | Payer: Medicare Other | Source: Ambulatory Visit

## 2016-06-11 ENCOUNTER — Encounter (HOSPITAL_COMMUNITY): Payer: Self-pay

## 2016-06-11 NOTE — Progress Notes (Signed)
Patient not here for PAT appt. Called patient and patient stated she forgot. Informed Network engineer.

## 2016-06-11 NOTE — Pre-Procedure Instructions (Signed)
[name]  06/11/2016      [facility] [address], [place] [address] Phone: [phone] Fax: [phone]    Your procedure is scheduled on June 18, 2016 at 730 am.  Report to [facility] at [place].M.  Call this number if you have problems the morning of surgery:  [phone]   Remember:  Do not eat food or drink liquids after midnight.  Take these medicines the morning of surgery with A SIP OF WATER Ziac, Wellbutrin, Klonopin, Flonase, Robaxin, Singulair.   Do not wear jewelry, make-up or nail polish.  Do not wear lotions, powders, or perfumes, or deoderant.  Do not shave 48 hours prior to surgery.   Do not bring valuables to the hospital.  [facility] is not responsible for any belongings or valuables.  Contacts, dentures or bridgework may not be worn into surgery.  Leave your suitcase in the car.  After surgery it may be brought to your room.  For patients admitted to the hospital, discharge time will be determined by your treatment team.  Patients discharged the day of surgery will not be allowed to drive home.   Name and phone number of your driver:    Special instructions:   [facility]- Preparing For Surgery  Before surgery, you can play an important role. Because skin is not sterile, your skin needs to be as free of germs as possible. You can reduce the number of germs on your skin by washing with CHG (chlorahexidine gluconate) Soap before surgery.  CHG is an antiseptic cleaner which kills germs and bonds with the skin to continue killing germs even after washing.  Please do not use if you have an allergy to CHG or antibacterial soaps. If your skin becomes reddened/irritated stop using the CHG.  Do not shave (including legs and underarms) for at least 48 hours prior to first CHG shower. It is OK to shave your face.  Please  follow these instructions carefully.   1. Shower the NIGHT BEFORE SURGERY and the MORNING OF SURGERY with CHG.   2. If you chose to wash your hair, wash your hair first as usual with your normal shampoo.  3. After you shampoo, rinse your hair and body thoroughly to remove the shampoo.  4. Use CHG as you would any other liquid soap. You can apply CHG directly to the skin and wash gently with a scrungie or a clean washcloth.   5. Apply the CHG Soap to your body ONLY FROM THE NECK DOWN.  Do not use on open wounds or open sores. Avoid contact with your eyes, ears, mouth and genitals (private parts). Wash genitals (private parts) with your normal soap.  6. Wash thoroughly, paying special attention to the area where your surgery will be performed.  7. Thoroughly rinse your body with warm water from the neck down.  8. DO NOT shower/wash with your normal soap after using and rinsing off the CHG Soap.  9. Pat yourself dry with a CLEAN TOWEL.   10. Wear CLEAN PAJAMAS   11. Place CLEAN SHEETS on your bed the night of your first shower and DO NOT SLEEP WITH PETS.    Day of Surgery: Do not apply any deodorants/lotions. Please wear clean clothes to the hospital/surgery center.      Please read over the following fact sheets that  you were given. Pain Booklet, Coughing and Deep Breathing and Total Joint Packet

## 2016-06-12 ENCOUNTER — Encounter (HOSPITAL_COMMUNITY): Payer: Self-pay

## 2016-06-12 ENCOUNTER — Encounter (HOSPITAL_COMMUNITY)
Admission: RE | Admit: 2016-06-12 | Discharge: 2016-06-12 | Disposition: A | Discharge status: Home / Self Care | Payer: PPO | Source: Ambulatory Visit | Attending: Orthopedic Surgery | Admitting: Orthopedic Surgery

## 2016-06-12 DIAGNOSIS — Z01812 Encounter for preprocedural laboratory examination: Secondary | ICD-10-CM | POA: Insufficient documentation

## 2016-06-12 LAB — TYPE AND SCREEN
ABO/RH(D): O POS
ANTIBODY SCREEN: NEGATIVE

## 2016-06-12 LAB — URINALYSIS, ROUTINE W REFLEX MICROSCOPIC
BILIRUBIN URINE: NEGATIVE
Glucose, UA: NEGATIVE mg/dL
Hgb urine dipstick: NEGATIVE
Ketones, ur: NEGATIVE mg/dL
LEUKOCYTES UA: NEGATIVE
NITRITE: NEGATIVE
PH: 6 (ref 5.0–8.0)
Protein, ur: NEGATIVE mg/dL
SPECIFIC GRAVITY, URINE: 1.013 (ref 1.005–1.030)

## 2016-06-12 LAB — APTT: APTT: 26 s (ref 24–36)

## 2016-06-12 LAB — PROTIME-INR
INR: 0.95
PROTHROMBIN TIME: 12.6 s (ref 11.4–15.2)

## 2016-06-12 LAB — SURGICAL PCR SCREEN
MRSA, PCR: NEGATIVE
Staphylococcus aureus: NEGATIVE

## 2016-06-12 NOTE — Pre-Procedure Instructions (Addendum)
[name]  06/12/2016      [facility] [place], [place] [address] Phone: [phone] Fax: [phone]    Your procedure is scheduled on June 18, 2016    Report to [facility] at [place].M.  Call this number if you have problems the morning of surgery:  [phone]   Remember:  Do not eat food or drink liquids after midnight.  Take these medicines the morning of surgery with A SIP OF WATER Ziac, Wellbutrin,, Flonase if needed, Robaxin if needed, Singulair.  STOP all herbel meds, nsaids (aleve,naproxen,advil,ibuprofen)   Starting TODAY including aspirin ,vit D, meloxicam, multi vit, fish oil, vit B-12   Do not wear jewelry, make-up or nail polish.  Do not wear lotions, powders, or perfumes, or deoderant.  Do not shave 48 hours prior to surgery.   Do not bring valuables to the hospital.  [facility] is not responsible for any belongings or valuables.  Contacts, dentures or bridgework may not be worn into surgery.  Leave your suitcase in the car.  After surgery it may be brought to your room.  For patients admitted to the hospital, discharge time will be determined by your treatment team.  Patients discharged the day of surgery will not be allowed to drive home.   Name and phone number of your driver:    Special instructions:   [facility]- Preparing For Surgery  Before surgery, you can play an important role. Because skin is not sterile, your skin needs to be as free of germs as possible. You can reduce the number of germs on your skin by washing with CHG (chlorahexidine gluconate) Soap before surgery.  CHG is an antiseptic cleaner which kills germs and bonds with the skin to continue killing germs even after washing.  Please do not use if you have an allergy to CHG or antibacterial soaps. If your skin becomes reddened/irritated stop  using the CHG.  Do not shave (including legs and underarms) for at least 48 hours prior to first CHG shower. It is OK to shave your face.  Please follow these instructions carefully.   1. Shower the NIGHT BEFORE SURGERY and the MORNING OF SURGERY with CHG.   2. If you chose to wash your hair, wash your hair first as usual with your normal shampoo.  3. After you shampoo, rinse your hair and body thoroughly to remove the shampoo.  4. Use CHG as you would any other liquid soap. You can apply CHG directly to the skin and wash gently with a scrungie or a clean washcloth.   5. Apply the CHG Soap to your body ONLY FROM THE NECK DOWN.  Do not use on open wounds or open sores. Avoid contact with your eyes, ears, mouth and genitals (private parts). Wash genitals (private parts) with your normal soap.  6. Wash thoroughly, paying special attention to the area where your surgery will be performed.  7. Thoroughly rinse your body with warm water from the neck down.  8. DO NOT shower/wash with your normal soap after using and rinsing off the CHG Soap.  9. Pat yourself dry with a CLEAN TOWEL.   10. Wear CLEAN PAJAMAS   11. Place CLEAN SHEETS on your bed the night of your first shower and DO NOT SLEEP WITH PETS.    Day of Surgery: Do not  apply any deodorants/lotions. Please wear clean clothes to the hospital/surgery center.      Please read over the fact sheets that you were given.

## 2016-06-13 DIAGNOSIS — M1712 Unilateral primary osteoarthritis, left knee: Secondary | ICD-10-CM | POA: Diagnosis present

## 2016-06-14 NOTE — H&P (Signed)
TOTAL KNEE ADMISSION H&P  Patient is being admitted for left total knee arthroplasty.  Subjective:  Chief Complaint:left knee pain.  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  Onset of symptoms was gradual, starting 5 years ago with gradually worsening course since that time. The patient noted no past surgery on the left knee(s).  Patient currently rates pain in the left knee(s) at 10 out of 10 with activity. Patient has night pain, worsening of pain with activity and weight bearing, pain that interferes with activities of daily living, pain with passive range of motion and crepitus.  Patient has evidence of subchondral sclerosis, joint subluxation and joint space narrowing by imaging studies. There is no active infection.  Patient Active Problem List   Diagnosis Date Noted  . Primary osteoarthritis of left knee 06/13/2016  . Primary osteoarthritis of left hip 01/24/2016  . Prediabetes 03/20/2014  . Medication management 03/20/2014  . Essential hypertension 06/15/2013  . Mixed hyperlipidemia 06/15/2013  . Vitamin D deficiency 06/15/2013  . Osteoarthritis of right knee 12/15/2012   Past Medical History:  Diagnosis Date  . Allergy    enviromental  . Anxiety   . Arthritis    osteoarthritis  . COPD (chronic obstructive pulmonary disease) (HCC)    mild  . Diabetes mellitus without complication ([place])    controlled by diet  . Environmental allergies   . Hepatitis    hepatitis A  . Hyperlipidemia   . Hypertension   . PONV (postoperative nausea and vomiting)    second surgery no problems last surgery  . Urgency of urination    Takes oxybutynin; under control at this time    Past Surgical History:   Procedure Laterality Date  . APPENDECTOMY     removed as child  . BACK SURGERY     lumbar fusion  . COLONOSCOPY    . DILATION AND CURETTAGE OF UTERUS    . LUMBAR FUSION  02/06/2012   L 4  L5  . PARTIAL HYSTERECTOMY    . TOTAL HIP ARTHROPLASTY  06/02/2012   Procedure: TOTAL HIP ARTHROPLASTY;  Surgeon: Kerin Salen, MD;  Location: [facility];  Service: Orthopedics;  Laterality: Right;  . TOTAL HIP ARTHROPLASTY Left 01/25/2016  . TOTAL HIP ARTHROPLASTY Left 01/25/2016   Procedure: LEFT TOTAL HIP ARTHROPLASTY;  Surgeon: Frederik Pear, MD;  Location: [facility];  Service: Orthopedics;  Laterality: Left;  . TOTAL KNEE ARTHROPLASTY Right 12/15/2012   Procedure: TOTAL KNEE ARTHROPLASTY;  Surgeon: Kerin Salen, MD;  Location: [facility];  Service: Orthopedics;  Laterality: Right;    No prescriptions prior to admission.   No Known Allergies  Social History  Substance Use Topics  . Smoking status: Current Every Day Smoker    Packs/day: 0.25    Years: 30.00    Types: Cigarettes  . Smokeless tobacco: Never Used  . Alcohol use 8.4 oz/week    14 Glasses of wine per week     Comment: either a glass of wine a night or 2 beers-advised none within 48 hrs of surgery    Family History  Problem Relation Age of Onset  . Diabetes Mother   . Colon cancer Mother   . Hypertension Father   . Colon cancer Sister   .  Lung cancer Brother   . Hypertension Brother   . Hypertension Brother   . Esophageal cancer Neg Hx   . Rectal cancer Neg Hx   . Stomach cancer Neg Hx      Review of Systems  Constitutional: Positive for malaise/fatigue.  HENT: Negative.        Sinus problems  Eyes: Negative.   Respiratory: Negative.   Cardiovascular:       HTN  Gastrointestinal: Positive for nausea.  Genitourinary: Positive for frequency and urgency.  Musculoskeletal: Positive for joint pain and myalgias.  Skin: Negative.   Neurological: Negative.   Endo/Heme/Allergies: Bruises/bleeds easily.  Psychiatric/Behavioral:  Negative.     Objective:  Physical Exam  Constitutional: She is oriented to person, place, and time. She appears well-developed and well-nourished.  HENT:  Head: Normocephalic and atraumatic.  Eyes: Pupils are equal, round, and reactive to light.  Neck: Normal range of motion. Neck supple.  Cardiovascular: Intact distal pulses.   Respiratory: Effort normal.  Musculoskeletal:    The left knee has a 15 valgus deformity and a 5 flexion contracture, no palpable effusion crepitus as you take her through range of motion.    Neurological: She is alert and oriented to person, place, and time.  Skin: Skin is warm and dry.  Psychiatric: She has a normal mood and affect. Her behavior is normal. Judgment and thought content normal.    Vital signs in last 24 hours:    Labs:   Estimated body mass index is 26.94 kg/m as calculated from the following:   Height as of 06/12/16: 5\' 5"  (1.651 m).   Weight as of 06/12/16: 73.4 kg (161 lb 14.4 oz).   Imaging Review Plain radiographs demonstrate bone-on-bone arthritic changes lateral compartment, confirming the 10-15 valgus deformity.  Assessment/Plan:  End stage arthritis, left knee   The patient history, physical examination, clinical judgment of the provider and imaging studies are consistent with end stage degenerative joint disease of the left knee(s) and total knee arthroplasty is deemed medically necessary. The treatment options including medical management, injection therapy arthroscopy and arthroplasty were discussed at length. The risks and benefits of total knee arthroplasty were presented and reviewed. The risks due to aseptic loosening, infection, stiffness, patella tracking problems, thromboembolic complications and other imponderables were discussed. The patient acknowledged the explanation, agreed to proceed with the plan and consent was signed. Patient is being admitted for inpatient treatment for surgery, pain control, PT, OT,  prophylactic antibiotics, VTE prophylaxis, progressive ambulation and ADL's and discharge planning. The patient is planning to be discharged home with home health services

## 2016-06-15 MED ORDER — TRANEXAMIC ACID 1000 MG/10ML IV SOLN
2000.0000 mg | Freq: Once | INTRAVENOUS | Status: DC
  Filled 2016-06-15: qty 20

## 2016-06-15 MED ORDER — BUPIVACAINE LIPOSOME 1.3 % IJ SUSP
20.0000 mL | Freq: Once | INTRAMUSCULAR | Status: DC
  Filled 2016-06-15: qty 20

## 2016-06-15 MED ORDER — TRANEXAMIC ACID 1000 MG/10ML IV SOLN
1000.0000 mg | INTRAVENOUS | Status: AC
  Administered 2016-06-18: 1000 mg via INTRAVENOUS
  Filled 2016-06-15: qty 10

## 2016-06-17 NOTE — Anesthesia Preprocedure Evaluation (Addendum)
Anesthesia Evaluation  Patient identified by MRN, date of birth, ID band Patient awake    Reviewed: Allergy & Precautions, NPO status , Patient's Chart, lab work & pertinent test results, reviewed documented beta blocker date and time   History of Anesthesia Complications (+) PONV and history of anesthetic complications  Airway Mallampati: II  TM Distance: >3 FB Neck ROM: Full    Dental  (+) Dental Advisory Given, Teeth Intact,    Pulmonary COPD,  COPD inhaler, Current Smoker,    breath sounds clear to auscultation       Cardiovascular hypertension, Pt. on medications (-) angina Rhythm:Regular Rate:Normal     Neuro/Psych S/p spinal fusion    GI/Hepatic negative GI ROS, Neg liver ROS, Remote Hep A   Endo/Other  diabetes (diet control)  Renal/GU negative Renal ROS     Musculoskeletal  (+) Arthritis ,   Abdominal   Peds  Hematology negative hematology ROS (+)   Anesthesia Other Findings Pt has edematous lesions post and lateral L neck, allergic vs insect bite. Dr Mayer Camel wishes to proceed  Reproductive/Obstetrics                           Anesthesia Physical Anesthesia Plan  ASA: II  Anesthesia Plan: Spinal   Post-op Pain Management:  Regional for Post-op pain   Induction:   Airway Management Planned: Natural Airway and Simple Face Mask  Additional Equipment:   Intra-op Plan:   Post-operative Plan:   Informed Consent: I have reviewed the patients History and Physical, chart, labs and discussed the procedure including the risks, benefits and alternatives for the proposed anesthesia with the patient or authorized representative who has indicated his/her understanding and acceptance.   Dental advisory given  Plan Discussed with: CRNA and Surgeon  Anesthesia Plan Comments: (Plan routine monitors, SAB with Adductor canal block for post op analgesia)        Anesthesia Quick  Evaluation

## 2016-06-18 ENCOUNTER — Inpatient Hospital Stay (HOSPITAL_COMMUNITY)
Admission: RE | Admit: 2016-06-18 | Discharge: 2016-06-19 | DRG: 470 | Disposition: A | Discharge status: Home Health | Payer: PPO | Source: Ambulatory Visit | Attending: Orthopedic Surgery | Admitting: Orthopedic Surgery

## 2016-06-18 ENCOUNTER — Encounter (HOSPITAL_COMMUNITY)
Admission: RE | Disposition: A | Discharge status: Home Health | Payer: Self-pay | Source: Ambulatory Visit | Attending: Orthopedic Surgery

## 2016-06-18 ENCOUNTER — Encounter (HOSPITAL_COMMUNITY): Payer: Self-pay | Admitting: *Deleted

## 2016-06-18 ENCOUNTER — Inpatient Hospital Stay (HOSPITAL_COMMUNITY): Payer: PPO | Admitting: Anesthesiology

## 2016-06-18 DIAGNOSIS — J449 Chronic obstructive pulmonary disease, unspecified: Secondary | ICD-10-CM | POA: Diagnosis not present

## 2016-06-18 DIAGNOSIS — F17210 Nicotine dependence, cigarettes, uncomplicated: Secondary | ICD-10-CM | POA: Diagnosis present

## 2016-06-18 DIAGNOSIS — E559 Vitamin D deficiency, unspecified: Secondary | ICD-10-CM | POA: Diagnosis present

## 2016-06-18 DIAGNOSIS — Z981 Arthrodesis status: Secondary | ICD-10-CM

## 2016-06-18 DIAGNOSIS — I10 Essential (primary) hypertension: Secondary | ICD-10-CM | POA: Diagnosis present

## 2016-06-18 DIAGNOSIS — Z96651 Presence of right artificial knee joint: Secondary | ICD-10-CM | POA: Diagnosis not present

## 2016-06-18 DIAGNOSIS — Z96643 Presence of artificial hip joint, bilateral: Secondary | ICD-10-CM | POA: Diagnosis present

## 2016-06-18 DIAGNOSIS — E119 Type 2 diabetes mellitus without complications: Secondary | ICD-10-CM | POA: Diagnosis not present

## 2016-06-18 DIAGNOSIS — M1712 Unilateral primary osteoarthritis, left knee: Secondary | ICD-10-CM | POA: Diagnosis not present

## 2016-06-18 DIAGNOSIS — E782 Mixed hyperlipidemia: Secondary | ICD-10-CM | POA: Diagnosis present

## 2016-06-18 DIAGNOSIS — G8918 Other acute postprocedural pain: Secondary | ICD-10-CM | POA: Diagnosis not present

## 2016-06-18 DIAGNOSIS — M25562 Pain in left knee: Secondary | ICD-10-CM | POA: Diagnosis not present

## 2016-06-18 HISTORY — PX: TOTAL KNEE ARTHROPLASTY: SHX125

## 2016-06-18 LAB — GLUCOSE, CAPILLARY
Glucose-Capillary: 100 mg/dL — ABNORMAL HIGH (ref 65–99)
Glucose-Capillary: 115 mg/dL — ABNORMAL HIGH (ref 65–99)

## 2016-06-18 SURGERY — ARTHROPLASTY, KNEE, TOTAL
Anesthesia: Spinal | Site: Knee | Laterality: Left

## 2016-06-18 MED ORDER — ALUM & MAG HYDROXIDE-SIMETH 200-200-20 MG/5ML PO SUSP: 30.0000 mL | ORAL | Status: DC | PRN

## 2016-06-18 MED ORDER — ONDANSETRON HCL 4 MG/2ML IJ SOLN
4.0000 mg | Freq: Four times a day (QID) | INTRAMUSCULAR | Status: DC | PRN
  Administered 2016-06-18 – 2016-06-19 (×2): 4 mg via INTRAVENOUS
  Filled 2016-06-18 (×2): qty 2

## 2016-06-18 MED ORDER — OXYCODONE HCL 5 MG PO TABS
5.0000 mg | ORAL_TABLET | ORAL | Status: DC | PRN
  Administered 2016-06-18 – 2016-06-19 (×5): 10 mg via ORAL
  Filled 2016-06-18 (×5): qty 2

## 2016-06-18 MED ORDER — BUPIVACAINE IN DEXTROSE 0.75-8.25 % IT SOLN
INTRATHECAL | Status: DC | PRN
  Administered 2016-06-18: 12 mg via INTRATHECAL

## 2016-06-18 MED ORDER — CEFAZOLIN SODIUM-DEXTROSE 2-4 GM/100ML-% IV SOLN
INTRAVENOUS | Status: AC
  Filled 2016-06-18: qty 100

## 2016-06-18 MED ORDER — METOCLOPRAMIDE HCL 5 MG PO TABS: 5.0000 mg | ORAL_TABLET | Freq: Three times a day (TID) | ORAL | Status: DC | PRN

## 2016-06-18 MED ORDER — DIPHENHYDRAMINE HCL 12.5 MG/5ML PO ELIX: 12.5000 mg | ORAL_SOLUTION | ORAL | Status: DC | PRN

## 2016-06-18 MED ORDER — BISOPROLOL-HYDROCHLOROTHIAZIDE 10-6.25 MG PO TABS
1.0000 | ORAL_TABLET | Freq: Every day | ORAL | Status: DC
Start: 2016-06-19 — End: 2016-06-19
  Administered 2016-06-19: 1 via ORAL
  Filled 2016-06-18: qty 1

## 2016-06-18 MED ORDER — METHOCARBAMOL 500 MG PO TABS: 500.0000 mg | ORAL_TABLET | Freq: Two times a day (BID) | ORAL | 0 refills | Status: DC

## 2016-06-18 MED ORDER — ACETAMINOPHEN 650 MG RE SUPP: 650.0000 mg | Freq: Four times a day (QID) | RECTAL | Status: DC | PRN

## 2016-06-18 MED ORDER — ACETAMINOPHEN 325 MG PO TABS: 650.0000 mg | ORAL_TABLET | Freq: Four times a day (QID) | ORAL | Status: DC | PRN

## 2016-06-18 MED ORDER — PROPOFOL 10 MG/ML IV BOLUS
INTRAVENOUS | Status: DC | PRN
  Administered 2016-06-18: 20 mg via INTRAVENOUS

## 2016-06-18 MED ORDER — CEFUROXIME SODIUM 1.5 G IJ SOLR
INTRAMUSCULAR | Status: AC
  Filled 2016-06-18: qty 1.5

## 2016-06-18 MED ORDER — KCL IN DEXTROSE-NACL 20-5-0.45 MEQ/L-%-% IV SOLN
INTRAVENOUS | Status: DC
  Administered 2016-06-18: 19:00:00 via INTRAVENOUS
  Filled 2016-06-18 (×2): qty 1000

## 2016-06-18 MED ORDER — METHOCARBAMOL 500 MG PO TABS
500.0000 mg | ORAL_TABLET | Freq: Four times a day (QID) | ORAL | Status: DC | PRN
  Administered 2016-06-18 – 2016-06-19 (×2): 500 mg via ORAL
  Filled 2016-06-18 (×2): qty 1

## 2016-06-18 MED ORDER — MONTELUKAST SODIUM 10 MG PO TABS
10.0000 mg | ORAL_TABLET | Freq: Every day | ORAL | Status: DC
  Administered 2016-06-18: 10 mg via ORAL
  Filled 2016-06-18: qty 1

## 2016-06-18 MED ORDER — BUPIVACAINE-EPINEPHRINE (PF) 0.25% -1:200000 IJ SOLN
INTRAMUSCULAR | Status: DC | PRN
Start: 2016-06-18 — End: 2016-06-18
  Administered 2016-06-18: 50 mL via PERINEURAL

## 2016-06-18 MED ORDER — BISACODYL 5 MG PO TBEC: 5.0000 mg | DELAYED_RELEASE_TABLET | Freq: Every day | ORAL | Status: DC | PRN

## 2016-06-18 MED ORDER — MIDAZOLAM HCL 2 MG/2ML IJ SOLN
INTRAMUSCULAR | Status: AC
  Filled 2016-06-18: qty 2

## 2016-06-18 MED ORDER — GABAPENTIN 300 MG PO CAPS
300.0000 mg | ORAL_CAPSULE | Freq: Three times a day (TID) | ORAL | Status: DC
  Administered 2016-06-18 – 2016-06-19 (×4): 300 mg via ORAL
  Filled 2016-06-18 (×4): qty 1

## 2016-06-18 MED ORDER — CELECOXIB 200 MG PO CAPS
200.0000 mg | ORAL_CAPSULE | Freq: Two times a day (BID) | ORAL | Status: DC
  Administered 2016-06-18 – 2016-06-19 (×3): 200 mg via ORAL
  Filled 2016-06-18 (×3): qty 1

## 2016-06-18 MED ORDER — CEFAZOLIN SODIUM-DEXTROSE 2-4 GM/100ML-% IV SOLN
2.0000 g | Freq: Four times a day (QID) | INTRAVENOUS | Status: AC
  Administered 2016-06-18 (×2): 2 g via INTRAVENOUS
  Filled 2016-06-18 (×2): qty 100

## 2016-06-18 MED ORDER — SENNOSIDES-DOCUSATE SODIUM 8.6-50 MG PO TABS
1.0000 | ORAL_TABLET | Freq: Every evening | ORAL | Status: DC | PRN
Start: 2016-06-18 — End: 2016-06-19

## 2016-06-18 MED ORDER — DIPHENHYDRAMINE HCL 50 MG/ML IJ SOLN
INTRAMUSCULAR | Status: DC | PRN
  Administered 2016-06-18: 12.5 mg via INTRAVENOUS

## 2016-06-18 MED ORDER — EPINEPHRINE PF 1 MG/ML IJ SOLN
INTRAMUSCULAR | Status: AC
  Filled 2016-06-18: qty 1

## 2016-06-18 MED ORDER — CEFUROXIME SODIUM 1.5 G IJ SOLR
INTRAMUSCULAR | Status: DC | PRN
  Administered 2016-06-18: 1.5 g

## 2016-06-18 MED ORDER — DOXYCYCLINE HYCLATE 20 MG PO TABS: 20.0000 mg | ORAL_TABLET | Freq: Every day | ORAL | Status: DC

## 2016-06-18 MED ORDER — DEXAMETHASONE SODIUM PHOSPHATE 10 MG/ML IJ SOLN
INTRAMUSCULAR | Status: DC | PRN
  Administered 2016-06-18: 8 mg via INTRAVENOUS

## 2016-06-18 MED ORDER — MIDAZOLAM HCL 5 MG/5ML IJ SOLN
INTRAMUSCULAR | Status: DC | PRN
  Administered 2016-06-18 (×2): 1 mg via INTRAVENOUS

## 2016-06-18 MED ORDER — HYDROMORPHONE HCL 2 MG/ML IJ SOLN
1.0000 mg | INTRAMUSCULAR | Status: DC | PRN
  Administered 2016-06-18: 1 mg via INTRAVENOUS
  Filled 2016-06-18: qty 1

## 2016-06-18 MED ORDER — PROPOFOL 10 MG/ML IV BOLUS
INTRAVENOUS | Status: AC
  Filled 2016-06-18: qty 20

## 2016-06-18 MED ORDER — ONDANSETRON HCL 4 MG/2ML IJ SOLN
INTRAMUSCULAR | Status: AC
  Filled 2016-06-18: qty 2

## 2016-06-18 MED ORDER — ASPIRIN EC 325 MG PO TBEC
325.0000 mg | DELAYED_RELEASE_TABLET | Freq: Every day | ORAL | Status: DC
Start: 2016-06-19 — End: 2016-06-19
  Administered 2016-06-19: 325 mg via ORAL
  Filled 2016-06-18: qty 1

## 2016-06-18 MED ORDER — ASPIRIN EC 325 MG PO TBEC: 325.0000 mg | DELAYED_RELEASE_TABLET | Freq: Two times a day (BID) | ORAL | 0 refills | Status: DC

## 2016-06-18 MED ORDER — ONDANSETRON HCL 4 MG PO TABS: 4.0000 mg | ORAL_TABLET | Freq: Four times a day (QID) | ORAL | Status: DC | PRN

## 2016-06-18 MED ORDER — BUPIVACAINE HCL (PF) 0.5 % IJ SOLN
INTRAMUSCULAR | Status: AC
  Filled 2016-06-18: qty 30

## 2016-06-18 MED ORDER — LIDOCAINE 5 % EX PTCH
1.0000 | MEDICATED_PATCH | Freq: Every day | CUTANEOUS | Status: DC | PRN
Start: 2016-06-18 — End: 2016-06-18

## 2016-06-18 MED ORDER — MIDAZOLAM HCL 2 MG/2ML IJ SOLN: 0.5000 mg | Freq: Once | INTRAMUSCULAR | Status: DC | PRN

## 2016-06-18 MED ORDER — HYDROMORPHONE HCL 1 MG/ML IJ SOLN: 0.2500 mg | INTRAMUSCULAR | Status: DC | PRN

## 2016-06-18 MED ORDER — PROMETHAZINE HCL 25 MG/ML IJ SOLN: 6.2500 mg | INTRAMUSCULAR | Status: DC | PRN

## 2016-06-18 MED ORDER — BUPIVACAINE LIPOSOME 1.3 % IJ SUSP
INTRAMUSCULAR | Status: DC | PRN
  Administered 2016-06-18: 20 mL

## 2016-06-18 MED ORDER — FENTANYL CITRATE (PF) 100 MCG/2ML IJ SOLN
INTRAMUSCULAR | Status: AC
  Filled 2016-06-18: qty 2

## 2016-06-18 MED ORDER — LACTATED RINGERS IV SOLN
INTRAVENOUS | Status: DC | PRN
  Administered 2016-06-18 (×2): via INTRAVENOUS

## 2016-06-18 MED ORDER — DOCUSATE SODIUM 100 MG PO CAPS
100.0000 mg | ORAL_CAPSULE | Freq: Two times a day (BID) | ORAL | Status: DC
  Administered 2016-06-18 – 2016-06-19 (×2): 100 mg via ORAL
  Filled 2016-06-18 (×2): qty 1

## 2016-06-18 MED ORDER — FENTANYL CITRATE (PF) 100 MCG/2ML IJ SOLN
INTRAMUSCULAR | Status: DC | PRN
  Administered 2016-06-18: 50 ug via INTRAVENOUS
  Administered 2016-06-18 (×2): 25 ug via INTRAVENOUS

## 2016-06-18 MED ORDER — MEPERIDINE HCL 25 MG/ML IJ SOLN: 6.2500 mg | INTRAMUSCULAR | Status: DC | PRN

## 2016-06-18 MED ORDER — METHOCARBAMOL 1000 MG/10ML IJ SOLN
500.0000 mg | Freq: Four times a day (QID) | INTRAMUSCULAR | Status: DC | PRN
  Filled 2016-06-18: qty 5

## 2016-06-18 MED ORDER — METOCLOPRAMIDE HCL 5 MG/ML IJ SOLN: 5.0000 mg | Freq: Three times a day (TID) | INTRAMUSCULAR | Status: DC | PRN

## 2016-06-18 MED ORDER — PROPOFOL 500 MG/50ML IV EMUL
INTRAVENOUS | Status: DC | PRN
  Administered 2016-06-18: 60 ug/kg/min via INTRAVENOUS

## 2016-06-18 MED ORDER — OXYCODONE-ACETAMINOPHEN 5-325 MG PO TABS: 1.0000 | ORAL_TABLET | ORAL | 0 refills | Status: DC | PRN

## 2016-06-18 MED ORDER — ROPIVACAINE HCL 7.5 MG/ML IJ SOLN
INTRAMUSCULAR | Status: DC | PRN
  Administered 2016-06-18: 20 mL via PERINEURAL

## 2016-06-18 MED ORDER — FLUTICASONE PROPIONATE 50 MCG/ACT NA SUSP: 1.0000 | Freq: Every day | NASAL | Status: DC

## 2016-06-18 MED ORDER — DEXAMETHASONE SODIUM PHOSPHATE 10 MG/ML IJ SOLN
INTRAMUSCULAR | Status: AC
  Filled 2016-06-18: qty 1

## 2016-06-18 MED ORDER — BUPROPION HCL ER (XL) 150 MG PO TB24
300.0000 mg | ORAL_TABLET | Freq: Every day | ORAL | Status: DC
  Administered 2016-06-19: 300 mg via ORAL
  Filled 2016-06-18: qty 2

## 2016-06-18 MED ORDER — ONDANSETRON HCL 4 MG/2ML IJ SOLN
INTRAMUSCULAR | Status: DC | PRN
  Administered 2016-06-18: 4 mg via INTRAVENOUS

## 2016-06-18 MED ORDER — MENTHOL 3 MG MT LOZG: 1.0000 | LOZENGE | OROMUCOSAL | Status: DC | PRN

## 2016-06-18 MED ORDER — SODIUM CHLORIDE 0.9 % IR SOLN
Status: DC | PRN
  Administered 2016-06-18: 1

## 2016-06-18 MED ORDER — CEFAZOLIN SODIUM-DEXTROSE 2-3 GM-% IV SOLR
INTRAVENOUS | Status: DC | PRN
  Administered 2016-06-18: 2 g via INTRAVENOUS

## 2016-06-18 MED ORDER — PHENYLEPHRINE 40 MCG/ML (10ML) SYRINGE FOR IV PUSH (FOR BLOOD PRESSURE SUPPORT)
PREFILLED_SYRINGE | INTRAVENOUS | Status: AC
  Filled 2016-06-18: qty 10

## 2016-06-18 MED ORDER — ENALAPRIL MALEATE 5 MG PO TABS
20.0000 mg | ORAL_TABLET | Freq: Every day | ORAL | Status: DC
  Administered 2016-06-18 – 2016-06-19 (×2): 20 mg via ORAL
  Filled 2016-06-18 (×2): qty 4

## 2016-06-18 MED ORDER — PHENOL 1.4 % MT LIQD: 1.0000 | OROMUCOSAL | Status: DC | PRN

## 2016-06-18 MED ORDER — FLEET ENEMA 7-19 GM/118ML RE ENEM: 1.0000 | ENEMA | Freq: Once | RECTAL | Status: DC | PRN

## 2016-06-18 MED ORDER — BUPIVACAINE HCL (PF) 0.25 % IJ SOLN
INTRAMUSCULAR | Status: AC
  Filled 2016-06-18: qty 30

## 2016-06-18 SURGICAL SUPPLY — 52 items
BANDAGE ACE 6X5 VEL STRL LF (GAUZE/BANDAGES/DRESSINGS) ×3 IMPLANT
BANDAGE ESMARK 6X9 LF (GAUZE/BANDAGES/DRESSINGS) ×1 IMPLANT
BLADE SAG 18X100X1.27 (BLADE) ×3 IMPLANT
BLADE SAW SGTL 13X75X1.27 (BLADE) ×3 IMPLANT
BNDG CMPR 9X6 STRL LF SNTH (GAUZE/BANDAGES/DRESSINGS) ×1
BNDG CMPR MED 10X6 ELC LF (GAUZE/BANDAGES/DRESSINGS) ×1
BNDG ELASTIC 6X10 VLCR STRL LF (GAUZE/BANDAGES/DRESSINGS) ×3 IMPLANT
BNDG ESMARK 6X9 LF (GAUZE/BANDAGES/DRESSINGS) ×3
BOWL SMART MIX CTS (DISPOSABLE) ×3 IMPLANT
CAPT KNEE TOTAL 3 ATTUNE ×3 IMPLANT
CEMENT HV SMART SET (Cement) ×6 IMPLANT
COVER SURGICAL LIGHT HANDLE (MISCELLANEOUS) ×3 IMPLANT
CUFF TOURNIQUET SINGLE 34IN LL (TOURNIQUET CUFF) ×3 IMPLANT
CUFF TOURNIQUET SINGLE 44IN (TOURNIQUET CUFF) IMPLANT
DRAPE EXTREMITY T 121X128X90 (DRAPE) ×3 IMPLANT
DRAPE U-SHAPE 47X51 STRL (DRAPES) ×3 IMPLANT
DRSG AQUACEL AG ADV 3.5X10 (GAUZE/BANDAGES/DRESSINGS) ×3 IMPLANT
DURAPREP 26ML APPLICATOR (WOUND CARE) ×3 IMPLANT
ELECT REM PT RETURN 9FT ADLT (ELECTROSURGICAL) ×3
ELECTRODE REM PT RTRN 9FT ADLT (ELECTROSURGICAL) ×1 IMPLANT
GLOVE BIO SURGEON STRL SZ7.5 (GLOVE) ×3 IMPLANT
GLOVE BIO SURGEON STRL SZ8.5 (GLOVE) ×6 IMPLANT
GLOVE BIOGEL PI IND STRL 8 (GLOVE) ×2 IMPLANT
GLOVE BIOGEL PI IND STRL 9 (GLOVE) ×1 IMPLANT
GLOVE BIOGEL PI INDICATOR 8 (GLOVE) ×4
GLOVE BIOGEL PI INDICATOR 9 (GLOVE) ×2
GOWN STRL REUS W/ TWL LRG LVL3 (GOWN DISPOSABLE) ×1 IMPLANT
GOWN STRL REUS W/ TWL XL LVL3 (GOWN DISPOSABLE) ×2 IMPLANT
GOWN STRL REUS W/TWL LRG LVL3 (GOWN DISPOSABLE) ×3
GOWN STRL REUS W/TWL XL LVL3 (GOWN DISPOSABLE) ×6
HANDPIECE INTERPULSE COAX TIP (DISPOSABLE) ×3
HOOD PEEL AWAY FACE SHEILD DIS (HOOD) ×6 IMPLANT
KIT BASIN OR (CUSTOM PROCEDURE TRAY) ×3 IMPLANT
KIT ROOM TURNOVER OR (KITS) ×3 IMPLANT
MANIFOLD NEPTUNE II (INSTRUMENTS) ×3 IMPLANT
NEEDLE 22X1 1/2 (OR ONLY) (NEEDLE) ×6 IMPLANT
NS IRRIG 1000ML POUR BTL (IV SOLUTION) ×3 IMPLANT
PACK TOTAL JOINT (CUSTOM PROCEDURE TRAY) ×3 IMPLANT
PAD ARMBOARD 7.5X6 YLW CONV (MISCELLANEOUS) ×6 IMPLANT
SET HNDPC FAN SPRY TIP SCT (DISPOSABLE) ×1 IMPLANT
SUT VIC AB 0 CT1 27 (SUTURE) ×3
SUT VIC AB 0 CT1 27XBRD ANBCTR (SUTURE) ×1 IMPLANT
SUT VIC AB 1 CTX 36 (SUTURE) ×4
SUT VIC AB 1 CTX36XBRD ANBCTR (SUTURE) ×2 IMPLANT
SUT VIC AB 2-0 CT1 27 (SUTURE) ×3
SUT VIC AB 2-0 CT1 TAPERPNT 27 (SUTURE) ×1 IMPLANT
SUT VIC AB 3-0 CT1 27 (SUTURE) ×3
SUT VIC AB 3-0 CT1 TAPERPNT 27 (SUTURE) ×1 IMPLANT
SYR CONTROL 10ML LL (SYRINGE) ×6 IMPLANT
TOWEL OR 17X24 6PK STRL BLUE (TOWEL DISPOSABLE) ×3 IMPLANT
TOWEL OR 17X26 10 PK STRL BLUE (TOWEL DISPOSABLE) ×3 IMPLANT
TRAY CATH 16FR W/PLASTIC CATH (SET/KITS/TRAYS/PACK) IMPLANT

## 2016-06-18 NOTE — Anesthesia Procedure Notes (Signed)
Anesthesia Regional Block:  Adductor canal block  Pre-Anesthetic Checklist: ,, timeout performed, Correct Patient, Correct Site, Correct Laterality, Correct Procedure, Correct Position, site marked, Risks and benefits discussed,  Surgical consent,  Pre-op evaluation,  At surgeon's request and post-op pain management  Laterality: Left and Lower  Prep: chloraprep       Needles:   Needle Type: Echogenic Needle     Needle Length: 9cm 9 cm Needle Gauge: 22 and 22 G    Additional Needles:  Procedures: ultrasound guided (picture in chart) Adductor canal block Narrative:  Start time: 06/18/2016 7:11 AM End time: 06/18/2016 7:19 AM Injection made incrementally with aspirations every 5 mL.  Performed by: Personally  Anesthesiologist: Glennon Mac, Christophr Calix  Additional Notes: Pt identified in Holding room.  Monitors applied. Working IV access confirmed. Sterile prep, drape L thigh.  #22ga ECHOgenic needle into adductor canal with US guidance.  20cc 0.75% ropivacaine injected incrementally after negative test dose, good spread in canal.  Patient asymptomatic, VSS, no heme aspirated, tolerated well.

## 2016-06-18 NOTE — Progress Notes (Signed)
Orthopedic Tech Progress Note Patient Details:  [name] [birth year] [id number]  Ortho Devices Ortho Device/Splint Location: trapeze bar patient helper Ortho Device/Splint Interventions: Application   Hildred Priest Viewed order from doctor's order list ortho will provide footsie roll when it becomes available; RN notified

## 2016-06-18 NOTE — Progress Notes (Signed)
Orthopedic Tech Progress Note Patient Details:  [name] April 11, 1951 [id number]  Ortho Devices Ortho Device/Splint Location: footsie roll Ortho Device/Splint Interventions: Criss Alvine 06/18/2016, 3:48 PM

## 2016-06-18 NOTE — Transfer of Care (Signed)
Immediate Anesthesia Transfer of Care Note  Patient: [name]  Procedure(s) Performed: Procedure(s): TOTAL KNEE ARTHROPLASTY (Left)  Patient Location: PACU  Anesthesia Type:Spinal and MAC combined with regional for post-op pain  Level of Consciousness: awake, alert  and oriented  Airway & Oxygen Therapy: Patient Spontanous Breathing and Patient connected to nasal cannula oxygen  Post-op Assessment: Report given to RN and Post -op Vital signs reviewed and stable  Post vital signs: Reviewed and stable  Last Vitals:  Vitals:   06/18/16 0613 06/18/16 0930  BP: (!) 153/70 (!) 149/70  Pulse: (!) 58 70  Resp: 18 11  Temp: 36.8 C 36.3 C    Last Pain:  Vitals:   06/18/16 0613  TempSrc: Oral      Patients Stated Pain Goal: 5 ([id number])  Complications: No apparent anesthesia complications

## 2016-06-18 NOTE — Op Note (Signed)
PATIENT ID:      [name]  MRN:     [id number] DOB/AGE:    June 21, 1951 / 65 y.o.       OPERATIVE REPORT    DATE OF PROCEDURE:  06/18/2016       PREOPERATIVE DIAGNOSIS:   LEFT KNEE OSTEOARTHRITIS      Estimated body mass index is 26.79 kg/m as calculated from the following:   Height as of this encounter: 5\' 5"  (1.651 m).   Weight as of this encounter: 73 kg (161 lb).                                                        POSTOPERATIVE DIAGNOSIS:   Same                                                                     PROCEDURE:  Procedure(s): TOTAL KNEE ARTHROPLASTY Using DepuyAttune RP implants #6L Femur, #5Tibia, 5 mm Attune RP bearing, 38 Patella     SURGEON: Reilley Valentine J    ASSISTANT:   [employer]   (Present and scrubbed throughout the case, critical for assistance with exposure, retraction, instrumentation, and closure.)         ANESTHESIA: Spinal, 20cc Exparel, 50cc 0.25% Marcaine  EBL: 300  FLUID REPLACEMENT: 1600 crystalloid  TOURNIQUET TIME: 25min  Drains: None  Tranexamic Acid: 1gm iv, 2gm topical   COMPLICATIONS:  None         INDICATIONS FOR PROCEDURE: The patient has  LEFT KNEE OSTEOARTHRITIS, Val deformities, XR shows bone on bone arthritis, lateral subluxation of tibia. Patient has failed all conservative measures including anti-inflammatory medicines, narcotics, attempts at  exercise and weight loss, cortisone injections and viscosupplementation.  Risks and benefits of surgery have been discussed, questions answered.   DESCRIPTION OF PROCEDURE: The patient identified by armband, received  IV antibiotics, in the holding area at [facility]. Patient taken to the operating room, appropriate anesthetic  monitors were attached, and Spinal anesthesia was  induced. Tourniquet  applied high to the operative thigh. Lateral post and foot positioner  applied to the table, the lower extremity was then prepped and draped  in usual sterile fashion from  the toes to the tourniquet. Time-out procedure was performed. We began the operation, with the knee flexed 120 degrees, by making the anterior midline incision starting at handbreadth above the patella going over the patella 1 cm medial to and 4 cm distal to the tibial tubercle. Small bleeders in the skin and the  subcutaneous tissue identified and cauterized. Transverse retinaculum was incised and reflected medially and a medial parapatellar arthrotomy was accomplished. the patella was everted and theprepatellar fat pad resected. The superficial medial collateral  ligament was then elevated from anterior to posterior along the proximal  flare of the tibia and anterior half of the menisci resected. The knee was hyperflexed exposing bone on bone arthritis. Peripheral and notch osteophytes as well as the cruciate ligaments were then resected. We continued to  work our way around posteriorly along the proximal tibia, and externally  rotated the tibia subluxing it out from underneath the femur. A McHale  retractor was placed through the notch and a lateral Hohmann retractor  placed, and we then drilled through the proximal tibia in line with the  axis of the tibia followed by an intramedullary guide rod and 2-degree  posterior slope cutting guide. The tibial cutting guide, 3 degree posterior sloped, was pinned into place allowing resection of 7 mm of bone medially and 10 mm of bone laterally. Satisfied with the tibial resection, we then  entered the distal femur 2 mm anterior to the PCL origin with the  intramedullary guide rod and applied the distal femoral cutting guide  set at 9 mm, with 5 degrees of valgus. This was pinned along the  epicondylar axis. At this point, the distal femoral cut was accomplished without difficulty. We then sized for a #6L femoral component and pinned the guide in 0 degrees of external rotation. The chamfer cutting guide was pinned into place. The anterior, posterior, and  chamfer cuts were accomplished without difficulty followed by  the Attune RP box cutting guide and the box cut. We also removed posterior osteophytes from the posterior femoral condyles. At this  time, the knee was brought into full extension. We checked our  extension and flexion gaps and found them symmetric for a 5 mm bearing. Distracting in extension with a lamina spreader, the posterior horns of the menisci were removed, and Exparel, diluted to 60 cc, with 20cc NS, and 20cc 0.5% Marcaine,was injected into the capsule and synovium of the knee. The posterior patella cut was accomplished with the 9.5 mm Attune cutting guide, sized for a 39mm dome, and the fixation pegs drilled.The knee  was then once again hyperflexed exposing the proximal tibia. We sized for a # 5 tibial base plate, applied the smokestack and the conical reamer followed by the the Delta fin keel punch. We then hammered into place the Attune RP trial femoral component, drilled the lugs, inserted a  5 mm trial bearing, trial patellar button, and took the knee through range of motion from 0-130 degrees. No thumb pressure was required for patellar Tracking. At this point, the limb was wrapped with an Esmarch bandage and the tourniquet inflated to 350 mmHg. All trial components were removed, mating surfaces irrigated with pulse lavage, and dried with suction and sponges. A double batch of DePuy HV cement with 1500 mg of Zinacef was mixed and applied to all bony metallic mating surfaces except for the posterior condyles of the femur itself. In order, we  hammered into place the tibial tray and removed excess cement, the femoral component and removed excess cement. The final Attune RP bearing  was inserted, and the knee brought to full extension with compression.  The patellar button was clamped into place, and excess cement  removed. While the cement cured the wound was irrigated out with normal saline solution pulse lavage. Ligament stability  and patellar tracking were checked and found to be excellent. The parapatellar arthrotomy was closed with  running #1 Vicryl suture. The subcutaneous tissue with 0 and 2-0 undyed  Vicryl suture, and the skin with running 3-0 SQ vicryl. A dressing of Xeroform,  4 x 4, dressing sponges, Webril, and Ace wrap applied. The patient  awakened, and taken to recovery room without difficulty.   Mishka Stegemann J 06/18/2016, 8:54 AM

## 2016-06-18 NOTE — Anesthesia Postprocedure Evaluation (Signed)
Anesthesia Post Note  Patient: [name]  Procedure(s) Performed: Procedure(s) (LRB): TOTAL KNEE ARTHROPLASTY (Left)  Patient location during evaluation: PACU Anesthesia Type: Spinal and Regional Level of consciousness: awake and alert, patient cooperative and oriented Pain management: pain level controlled Vital Signs Assessment: post-procedure vital signs reviewed and stable Respiratory status: spontaneous breathing, nonlabored ventilation and respiratory function stable Cardiovascular status: blood pressure returned to baseline and stable Postop Assessment: patient able to bend at knees and spinal receding Anesthetic complications: no    Last Vitals:  Vitals:   06/18/16 1015 06/18/16 1030  BP: (!) 174/69 (!) 170/60  Pulse: (!) 54 (!) 56  Resp: 13 16  Temp:  36.6 C    Last Pain:  Vitals:   06/18/16 0613  TempSrc: Oral    LLE Motor Response: Purposeful movement;Responds to commands (06/18/16 1030) LLE Sensation: Decreased (06/18/16 1030) RLE Motor Response: Purposeful movement;Responds to commands (06/18/16 1030) RLE Sensation: Decreased (06/18/16 1030) L Sensory Level: S1-Sole of foot, small toes (06/18/16 1030) R Sensory Level: S1-Sole of foot, small toes (06/18/16 1030)  Zoe Creasman,E. Michala Deblanc

## 2016-06-18 NOTE — Evaluation (Signed)
Physical Therapy Evaluation Patient Details Name: [name] MRN: [id number] DOB: Jul 14, 1951 Today's Date: 06/18/2016   History of Present Illness  Admitted for LTKA, WBAT;  has a past medical history of  COPD (chronic obstructive pulmonary disease) ([place]); Diabetes mellitus without complication ([place]); Hepatitis;  Hypertension; PONV (postoperative nausea and vomiting);  has a past surgical history that includes Lumbar fusion (02/06/2012); Total knee arthroplasty (Right, 12/15/2012); Total hip arthroplasty (Left, 01/25/2016)  Clinical Impression   Pt is s/p TKA resulting in the deficits listed below (see PT Problem List). Overall moving well, and pt is well-versed in TJR recovery; on track for dc home tomorrow;  Pt will benefit from skilled PT to increase their independence and safety with mobility to allow discharge to the venue listed below.      Follow Up Recommendations Home health PT    Equipment Recommendations  None recommended by PT (pretty well-equipped)    Recommendations for Other Services       Precautions / Restrictions Precautions Precautions: Knee Precaution Booklet Issued: Yes (comment) Precaution Comments: Pt educated to not allow any pillow or bolster under knee for healing with optimal range of motion.  Restrictions Weight Bearing Restrictions: Yes LLE Weight Bearing: Weight bearing as tolerated      Mobility  Bed Mobility Overal bed mobility: Needs Assistance Bed Mobility: Supine to Sit     Supine to sit: Supervision     General bed mobility comments: Cues for technique  Transfers Overall transfer level: Needs assistance Equipment used: Rolling walker (2 wheeled) Transfers: Sit to/from Stand Sit to Stand: Supervision         General transfer comment: Cues for hand placement and safety; tends to stand with both hands on RW; looks like this is a habit  Ambulation/Gait Ambulation/Gait assistance: Min guard Ambulation Distance (Feet): 20 Feet  (to and from bathroom) Assistive device: Rolling walker (2 wheeled) Gait Pattern/deviations: Step-through pattern   Gait velocity interpretation: Below normal speed for age/gender General Gait Details: Cues to self-monitor for activity tolerance; cues also to activate L quad for stance stabiltiy  Stairs            Wheelchair Mobility    Modified Rankin (Stroke Patients Only)       Balance                                             Pertinent Vitals/Pain Pain Assessment: 0-10 Pain Score: 2  Pain Location: L knee Pain Descriptors / Indicators: Aching Pain Intervention(s): Premedicated before session    Home Living Family/patient expects to be discharged to:: Private residence Living Arrangements: Spouse/significant other Available Help at Discharge: Family;Available 24 hours/day Type of Home: House Home Access: Stairs to enter Entrance Stairs-Rails: None Entrance Stairs-Number of Steps: 2 Home Layout: Multi-level;Able to live on main level with bedroom/bathroom Home Equipment: Walker - 2 wheels;Grab bars - tub/shower;Hand held shower head;Shower seat      Prior Function Level of Independence: Independent               Hand Dominance   Dominant Hand: Right    Extremity/Trunk Assessment   Upper Extremity Assessment: Overall WFL for tasks assessed           Lower Extremity Assessment: LLE deficits/detail   LLE Deficits / Details: Grossly decr AROM and strength postop; noting overall good quad contraction and good knee  control     Communication   Communication: No difficulties  Cognition Arousal/Alertness: Awake/alert Behavior During Therapy: WFL for tasks assessed/performed Overall Cognitive Status: Within Functional Limits for tasks assessed                      General Comments      Exercises Total Joint Exercises Quad Sets: AROM;Left;10 reps Heel Slides: AROM;Left;5 reps Straight Leg Raises: AROM;Left;5 reps  (minimal uqad lag) Goniometric ROM: approx 5-85deg   Assessment/Plan    PT Assessment Patient needs continued PT services  PT Problem List Decreased strength;Decreased range of motion;Decreased activity tolerance;Decreased balance;Decreased mobility;Decreased knowledge of use of DME;Decreased knowledge of precautions;Pain          PT Treatment Interventions DME instruction;Gait training;Stair training;Functional mobility training;Therapeutic activities;Therapeutic exercise;Patient/family education    PT Goals (Current goals can be found in the Care Plan section)  Acute Rehab PT Goals Patient Stated Goal: walk without pain PT Goal Formulation: With patient Time For Goal Achievement: 06/25/16 Potential to Achieve Goals: Good    Frequency 7X/week   Barriers to discharge        Co-evaluation               End of Session Equipment Utilized During Treatment: Gait belt Activity Tolerance: Patient tolerated treatment well Patient left: in chair;with call bell/phone within reach Nurse Communication: Mobility status         Time: 1343-1400 PT Time Calculation (min) (ACUTE ONLY): 17 min   Charges:   PT Evaluation $PT Eval Low Complexity: 1 Procedure     PT G CodesColletta Maryland 06/18/2016, 4:36 PM   Roney Marion, [place] Pager [phone] Office [phone]

## 2016-06-18 NOTE — Interval H&P Note (Signed)
History and Physical Interval Note:  06/18/2016 7:14 AM  [name]  has presented today for surgery, with the diagnosis of LEFT KNEE OSTEOARTHRITIS  The various methods of treatment have been discussed with the patient and family. After consideration of risks, benefits and other options for treatment, the patient has consented to  Procedure(s): TOTAL KNEE ARTHROPLASTY (Left) as a surgical intervention .  The patient's history has been reviewed, patient examined, no change in status, stable for surgery.  I have reviewed the patient's chart and labs.  Questions were answered to the patient's satisfaction.     Kerin Salen

## 2016-06-18 NOTE — Anesthesia Procedure Notes (Signed)
Spinal  Patient location during procedure: OR End time: 06/18/2016 7:43 AM Staffing Anesthesiologist: Annye Asa Performed: anesthesiologist  Preanesthetic Checklist Completed: patient identified, site marked, surgical consent, pre-op evaluation, timeout performed, IV checked, risks and benefits discussed and monitors and equipment checked Spinal Block Patient position: sitting Prep: ChloraPrep and site prepped and draped Patient monitoring: blood pressure, continuous pulse ox, cardiac monitor and heart rate Approach: midline Location: L2-3 Injection technique: single-shot Needle Needle type: Quincke  Needle gauge: 25 G Needle length: 9 cm Additional Notes Pt identified in Operating room.  Monitors applied. Working IV access confirmed. Sterile prep, drape lumbar spine.  1% lido local L 2,3.  #25ga Quincke into clear CSF L 2,3.  12mg  0.75% Bupivacaine with dextrose injected with asp CSF beginning and end of injection.  Patient asymptomatic, VSS, no heme aspirated, tolerated well.  Jenita Seashore, MD

## 2016-06-18 NOTE — Discharge Instructions (Signed)

## 2016-06-19 ENCOUNTER — Encounter (HOSPITAL_COMMUNITY): Payer: Self-pay | Admitting: Orthopedic Surgery

## 2016-06-19 LAB — BASIC METABOLIC PANEL
Anion gap: 6 (ref 5–15)
BUN: 8 mg/dL (ref 6–20)
CO2: 28 mmol/L (ref 22–32)
CREATININE: 0.68 mg/dL (ref 0.44–1.00)
Calcium: 9 mg/dL (ref 8.9–10.3)
Chloride: 104 mmol/L (ref 101–111)
GFR calc Af Amer: >60 mL/min (ref >60)
Glucose, Bld: 120 mg/dL — ABNORMAL HIGH (ref 65–99)
Potassium: 4.2 mmol/L (ref 3.5–5.1)
SODIUM: 138 mmol/L (ref 135–145)

## 2016-06-19 LAB — CBC
HCT: 32.5 % — ABNORMAL LOW (ref 36.0–46.0)
Hemoglobin: 11 g/dL — ABNORMAL LOW (ref 12.0–15.0)
MCH: 26.7 pg (ref 26.0–34.0)
MCHC: 33.8 g/dL (ref 30.0–36.0)
MCV: 78.9 fL (ref 78.0–100.0)
PLATELETS: 184 10*3/uL (ref 150–400)
RBC: 4.12 MIL/uL (ref 3.87–5.11)
RDW: 14.1 % (ref 11.5–15.5)
WBC: 8.2 10*3/uL (ref 4.0–10.5)

## 2016-06-19 NOTE — Progress Notes (Signed)
Physical Therapy Treatment Patient Details Name: [name] MRN: [id number] DOB: 1951/04/18 Today's Date: 06/19/2016    History of Present Illness Admitted for LTKA, WBAT;  has a past medical history of  COPD (chronic obstructive pulmonary disease) ([place]); Diabetes mellitus without complication ([place]); Hepatitis;  Hypertension; PONV (postoperative nausea and vomiting);  has a past surgical history that includes Lumbar fusion (02/06/2012); Total knee arthroplasty (Right, 12/15/2012); Total hip arthroplasty (Left, 01/25/2016)    PT Comments    Patient is making good progress with PT.  From a mobility standpoint anticipate patient will be ready for DC home when medically ready.     Follow Up Recommendations  Home health PT     Equipment Recommendations  None recommended by PT (pretty well-equipped)    Recommendations for Other Services       Precautions / Restrictions Precautions Precautions: Knee Precaution Booklet Issued: Yes (comment) Restrictions Weight Bearing Restrictions: Yes LLE Weight Bearing: Weight bearing as tolerated    Mobility  Bed Mobility Overal bed mobility: Modified Independent             General bed mobility comments: increased effort  Transfers Overall transfer level: Modified independent Equipment used: Rolling walker (2 wheeled) Transfers: Sit to/from Stand              Ambulation/Gait Ambulation/Gait assistance: Supervision Ambulation Distance (Feet): 200 Feet Assistive device: Rolling walker (2 wheeled) Gait Pattern/deviations: Step-through pattern     General Gait Details: cues for L heel strike; pt with little reliance on RW for support and symmetrical step lengths   Stairs Stairs: Yes Stairs assistance: Min guard Stair Management: No rails;Backwards;With walker Number of Stairs:  (2X2) General stair comments: cues for sequencing and technique; assist to stabilize RW  Wheelchair Mobility    Modified Rankin (Stroke  Patients Only)       Balance                                    Cognition Arousal/Alertness: Awake/alert Behavior During Therapy: WFL for tasks assessed/performed Overall Cognitive Status: Within Functional Limits for tasks assessed                      Exercises Total Joint Exercises Quad Sets: AROM;Left;10 reps Heel Slides: AROM;Left;10 reps Hip ABduction/ADduction: AROM;Left;10 reps Straight Leg Raises: AROM;Left;10 reps Long Arc Quad: AROM;Left;10 reps Goniometric ROM: 95 degrees of flexion    General Comments        Pertinent Vitals/Pain Pain Assessment: No/denies pain    Home Living                      Prior Function            PT Goals (current goals can now be found in the care plan section) Acute Rehab PT Goals Patient Stated Goal: go home PT Goal Formulation: With patient Time For Goal Achievement: 06/25/16 Potential to Achieve Goals: Good Progress towards PT goals: Progressing toward goals    Frequency    7X/week      PT Plan Current plan remains appropriate    Co-evaluation             End of Session Equipment Utilized During Treatment: Gait belt Activity Tolerance: Patient tolerated treatment well Patient left: in chair;with call bell/phone within reach     Time: [id number] PT Time Calculation (min) (ACUTE ONLY): 23 min  Charges:  $Gait Training: 8-22 mins $Therapeutic Exercise: 8-22 mins                    G Codes:      [place] April, PTA Pager: [phone]   06/19/2016, 10:55 AM

## 2016-06-19 NOTE — Progress Notes (Addendum)
PATIENT ID: [name]  MRN: [id number]  DOB/AGE:  [birth year] / 65 y.o.  1 Day Post-Op Procedure(s) (LRB): TOTAL KNEE ARTHROPLASTY (Left)    PROGRESS NOTE Subjective: Patient is alert, oriented, no Nausea, no Vomiting, yes passing gas. Taking PO well. Denies SOB, Chest or Calf Pain. Using Incentive Spirometer, PAS in place. Ambulate 25 ft independent,no CPM  Patient reports pain as 1/10,Had 0 300 this morning.  She was having no discomfort, but noticed bleeding through the Ace wrap on her dressing which was reinforced. .    Objective: Vital signs in last 24 hours: Vitals:   06/18/16 1300 06/18/16 2031 06/19/16 0010 06/19/16 0430  BP: (!) 177/81 (!) 143/73 134/80 (!) 154/57  Pulse: (!) 56 61 (!) 58 (!) 57  Resp: 18 16 16 16   Temp: 98.1 F (36.7 C) 98.3 F (36.8 C) 98.1 F (36.7 C) 98.1 F (36.7 C)  TempSrc: Oral Oral Oral Oral  SpO2: 98% 94% 96% 95%  Weight:      Height:          Intake/Output from previous day: I/O last 3 completed shifts: In: [id number] [P.O.:480; I.V.:1300] Out: 525 [Urine:500; Blood:25]   Intake/Output this shift: No intake/output data recorded.   LABORATORY DATA:  Recent Labs  06/18/16 0617 06/18/16 0937 06/19/16 0308  WBC  --   --  8.2  HGB  --   --  11.0*  HCT  --   --  32.5*  PLT  --   --  184  NA  --   --  138  K  --   --  4.2  CL  --   --  104  CO2  --   --  28  BUN  --   --  8  CREATININE  --   --  0.68  GLUCOSE  --   --  120*  GLUCAP 100* 115*  --   CALCIUM  --   --  9.0    Examination: Neurologically intact ABD soft Neurovascular intact Sensation intact distally Intact pulses distally Dorsiflexion/Plantar flexion intact Incision: moderate drainage No cellulitis present Compartment soft} The left leg was unwrapped the Aquacel dressing was noted to be soaked it was removed the wound was cleaned there is no active bleeding and a Mepilex dressing and Ace wrap was applied.Range of motion to manual testing today was from 5-100  with minimal discomfort.  Assessment:   1 Day Post-Op Procedure(s) (LRB): TOTAL KNEE ARTHROPLASTY (Left) ADDITIONAL DIAGNOSIS: Expected Acute Blood Loss Anemia, Hypertension  Plan: PT/OT WBAT, No CPM DVT Prophylaxis:  SCDx72hrs, ASA 325 mg BID x 2 weeks DISCHARGE PLAN: Home,Patient may be discharged home today.  If she passes all PT goals.  She will not be getting any CPM. DISCHARGE NEEDS: HHPT, Walker and 3-in-1 comode seat     Daquawn Seelman J 06/19/2016, 7:51 AM Patient ID: [name], female   DOB: 1950/10/04, 65 y.o.   MRN: [id number]

## 2016-06-19 NOTE — Progress Notes (Signed)
OT Cancellation Note  Patient Details Name: [name] MRN: [id number] DOB: [birth year]   Cancelled Treatment:    Reason Eval/Treat Not Completed: OT screened, no needs identified, will sign off  [facility], OTR/L  [id number] 06/19/2016 06/19/2016, 11:43 AM

## 2016-06-19 NOTE — Care Management Note (Signed)
Case Management Note  Patient Details  Name: [name] MRN: [id number] Date of Birth: [birth year]  Subjective/Objective:   65 yr old female s/p left total knee arthroplasty.                 Action/Plan: Case manager spoke with patient concerning home health and DME needs. Patient was preoperatively setup with [place], no changes. She has rolling walker and 3in1, will not be using CPM. Patient has family support at discharge.    Expected Discharge Date:   06/19/16               Expected Discharge Plan:  [place]  In-House Referral:  NA  Discharge planning Services  CM Consult  Post Acute Care Choice:  Home Health Choice offered to:  Patient  DME Arranged:  N/A DME Agency:  NA  HH Arranged:  PT [place] Agency:  [facility]  Status of Service:  Completed, signed off  If discussed at [place] of Stay Meetings, dates discussed:    Additional Comments:  Ninfa Meeker, RN 06/19/2016, 11:29 AM

## 2016-06-19 NOTE — Progress Notes (Signed)
Patient called RN into room to see her ace bandage that was soaked with blood. Pt stated that she did not realize that blood had come through the ace wrap because she was not in pain. RN cleaned pt up and wrapped a second ace bandage around her leg. MD on call has been called to be notified. Will continue to monitor

## 2016-06-19 NOTE — Progress Notes (Signed)
Discharge instructions and prescriptions reviewed with patient. Patient stated understanding. IV removed. Patient has Transportation at bedside

## 2016-06-19 NOTE — Discharge Summary (Signed)
Patient ID: [name] MRN: [id number] DOB/AGE: 1950-11-30 65 y.o.  Admit date: 06/18/2016 Discharge date: 06/19/2016  Admission Diagnoses:  Principal Problem:   Primary osteoarthritis of left knee Active Problems:   Primary localized osteoarthritis of left knee   Discharge Diagnoses:  Same  Past Medical History:  Diagnosis Date  . Allergy    enviromental  . Anxiety   . Arthritis    osteoarthritis  . COPD (chronic obstructive pulmonary disease) (HCC)    mild  . Diabetes mellitus without complication ([place])    controlled by diet  . Environmental allergies   . Hepatitis    hepatitis A  . Hyperlipidemia   . Hypertension   . PONV (postoperative nausea and vomiting)    second surgery no problems last surgery  . Urgency of urination    Takes oxybutynin; under control at this time    Surgeries: Procedure(s): TOTAL KNEE ARTHROPLASTY on 06/18/2016   Consultants:   Discharged Condition: Improved  Hospital Course: [name] is an 65 y.o. female who was admitted 06/18/2016 for operative treatment ofPrimary osteoarthritis of left knee. Patient has severe unremitting pain that affects sleep, daily activities, and work/hobbies. After pre-op clearance the patient was taken to the operating room on 06/18/2016 and underwent  Procedure(s): TOTAL KNEE ARTHROPLASTY.    Patient was given perioperative antibiotics: Anti-infectives    Start     Dose/Rate Route Frequency Ordered Stop   06/18/16 1400  ceFAZolin (ANCEF) IVPB 2g/100 mL premix     2 g 200 mL/hr over 30 Minutes Intravenous Every 6 hours 06/18/16 1055 06/18/16 2232   06/18/16 1100  doxycycline (PERIOSTAT) tablet 20 mg  Status:  Discontinued     20 mg Oral Daily 06/18/16 1055 06/18/16 1106   06/18/16 0853  cefUROXime (ZINACEF) injection  Status:  Discontinued       As needed 06/18/16 0853 06/18/16 0923   06/18/16 0551  ceFAZolin (ANCEF) 2-4 GM/100ML-% IVPB    Comments:  Connye Burkitt   : cabinet override   06/18/16 0551 06/18/16 1759       Patient was given sequential compression devices, early ambulation, and chemoprophylaxis to prevent DVT.  Patient benefited maximally from hospital stay and there were no complications.    Recent vital signs: Patient Vitals for the past 24 hrs:  BP Temp Temp src Pulse Resp SpO2  06/19/16 0430 (!) 154/57 98.1 F (36.7 C) Oral (!) 57 16 95 %  06/19/16 0010 134/80 98.1 F (36.7 C) Oral (!) 58 16 96 %  06/18/16 2031 (!) 143/73 98.3 F (36.8 C) Oral 61 16 94 %  06/18/16 1300 (!) 177/81 98.1 F (36.7 C) Oral (!) 56 18 98 %     Recent laboratory studies:  Recent Labs  06/19/16 0308  WBC 8.2  HGB 11.0*  HCT 32.5*  PLT 184  NA 138  K 4.2  CL 104  CO2 28  BUN 8  CREATININE 0.68  GLUCOSE 120*  CALCIUM 9.0     Discharge Medications:     Medication List    STOP taking these medications   meloxicam 15 MG tablet Commonly known as:  MOBIC     TAKE these medications   aspirin EC 325 MG tablet Take 1 tablet (325 mg total) by mouth 2 (two) times daily. What changed:  medication strength  how much to take  when to take this   atorvastatin 80 MG tablet Commonly known as:  LIPITOR Take 40 mg by mouth 3 (three)  times a week.   bisoprolol-hydrochlorothiazide 10-6.25 MG tablet Commonly known as:  ZIAC Take 1 tablet by mouth every morning.   buPROPion 300 MG 24 hr tablet Commonly known as:  WELLBUTRIN XL Take 1 tablet (300 mg total) by mouth daily.   clonazePAM 0.5 MG tablet Commonly known as:  KLONOPIN TAKE 1/2-1 TABLET BY MOUTH AT BEDTIME FOR SLEEP   doxycycline 20 MG tablet Commonly known as:  PERIOSTAT Take 20 mg by mouth daily.   enalapril 20 MG tablet Commonly known as:  VASOTEC TAKE 1 TABLET BY MOUTH EVERY DAY   Fish Oil 1000 MG Caps Take 1 capsule by mouth daily.   fluticasone 50 MCG/ACT nasal spray Commonly known as:  FLONASE 1 Spray each nostril daily   lidocaine 5 % Commonly known as:  LIDODERM Place 1  patch onto the skin daily as needed (pain). Remove & Discard patch within 12 hours or as directed by MD   methocarbamol 500 MG tablet Commonly known as:  ROBAXIN Take 1 tablet (500 mg total) by mouth 2 (two) times daily with a meal. What changed:  when to take this   montelukast 10 MG tablet Commonly known as:  SINGULAIR TAKE 1 TABLET(10 MG) BY MOUTH DAILY   MULTI COMPLETE PO Take 1 capsule by mouth daily.   oxyCODONE-acetaminophen 5-325 MG tablet Commonly known as:  ROXICET Take 1 tablet by mouth every 4 (four) hours as needed.   vitamin B-12 1000 MCG tablet Commonly known as:  CYANOCOBALAMIN Take 1,000 mcg by mouth daily.   Vitamin D3 5000 units Tabs Take 5,000 Units by mouth daily.            Durable Medical Equipment        Start     Ordered   06/18/16 1056  DME Walker rolling  Once    Question:  Patient needs a walker to treat with the following condition  Answer:  Primary localized osteoarthritis of left knee   06/18/16 1055   06/18/16 1056  DME 3 n 1  Once     06/18/16 1055   06/18/16 1056  DME Bedside commode  Once    Question:  Patient needs a bedside commode to treat with the following condition  Answer:  Primary localized osteoarthritis of left knee   06/18/16 1055      Diagnostic Studies: No results found.  Disposition: 06-Home-Health Care Svc  Discharge Instructions    Call MD / Call 911    Complete by:  As directed    If you experience chest pain or shortness of breath, CALL 911 and be transported to the hospital emergency room.  If you develope a fever above 101 F, pus (white drainage) or increased drainage or redness at the wound, or calf pain, call your surgeon's office.   Constipation Prevention    Complete by:  As directed    Drink plenty of fluids.  Prune juice may be helpful.  You may use a stool softener, such as Colace (over the counter) 100 mg twice a day.  Use MiraLax (over the counter) for constipation as needed.   Diet - low sodium  heart healthy    Complete by:  As directed    Driving restrictions    Complete by:  As directed    No driving for 2 weeks   Increase activity slowly as tolerated    Complete by:  As directed    Patient may shower    Complete by:  As  directed    You may shower without a dressing once there is no drainage.  Do not wash over the wound.  If drainage remains, cover wound with plastic wrap and then shower.      Follow-up Information    Kerin Salen, MD Follow up in 2 week(s).   Specialty:  Orthopedic Surgery Contact information: [place] [address] [place] Follow up.   Why:  Someone from [facility] will contact you to arrange start date and time for therapy. Contact information: [place] [address] [phone]            Signed: Hardin Negus, ERIC R 06/19/2016, 11:23 AM

## 2016-06-19 NOTE — Progress Notes (Signed)
Pt's dressing that was reinforced last night continues to be clean and intact. Will inform on coming RN.

## 2016-06-20 DIAGNOSIS — F17210 Nicotine dependence, cigarettes, uncomplicated: Secondary | ICD-10-CM | POA: Diagnosis not present

## 2016-06-20 DIAGNOSIS — J449 Chronic obstructive pulmonary disease, unspecified: Secondary | ICD-10-CM | POA: Diagnosis not present

## 2016-06-20 DIAGNOSIS — R7303 Prediabetes: Secondary | ICD-10-CM | POA: Diagnosis not present

## 2016-06-20 DIAGNOSIS — Z7982 Long term (current) use of aspirin: Secondary | ICD-10-CM | POA: Diagnosis not present

## 2016-06-20 DIAGNOSIS — E782 Mixed hyperlipidemia: Secondary | ICD-10-CM | POA: Diagnosis not present

## 2016-06-20 DIAGNOSIS — I10 Essential (primary) hypertension: Secondary | ICD-10-CM | POA: Diagnosis not present

## 2016-06-20 DIAGNOSIS — B159 Hepatitis A without hepatic coma: Secondary | ICD-10-CM | POA: Diagnosis not present

## 2016-06-20 DIAGNOSIS — Z96652 Presence of left artificial knee joint: Secondary | ICD-10-CM | POA: Diagnosis not present

## 2016-06-20 DIAGNOSIS — E559 Vitamin D deficiency, unspecified: Secondary | ICD-10-CM | POA: Diagnosis not present

## 2016-06-20 DIAGNOSIS — F419 Anxiety disorder, unspecified: Secondary | ICD-10-CM | POA: Diagnosis not present

## 2016-06-20 DIAGNOSIS — Z471 Aftercare following joint replacement surgery: Secondary | ICD-10-CM | POA: Diagnosis not present

## 2016-06-20 DIAGNOSIS — R339 Retention of urine, unspecified: Secondary | ICD-10-CM | POA: Diagnosis not present

## 2016-06-26 DIAGNOSIS — M25562 Pain in left knee: Secondary | ICD-10-CM | POA: Diagnosis not present

## 2016-06-26 DIAGNOSIS — Z96652 Presence of left artificial knee joint: Secondary | ICD-10-CM | POA: Diagnosis not present

## 2016-06-29 DIAGNOSIS — R7303 Prediabetes: Secondary | ICD-10-CM | POA: Diagnosis not present

## 2016-06-29 DIAGNOSIS — J449 Chronic obstructive pulmonary disease, unspecified: Secondary | ICD-10-CM | POA: Diagnosis not present

## 2016-06-29 DIAGNOSIS — Z471 Aftercare following joint replacement surgery: Secondary | ICD-10-CM | POA: Diagnosis not present

## 2016-06-29 DIAGNOSIS — F419 Anxiety disorder, unspecified: Secondary | ICD-10-CM | POA: Diagnosis not present

## 2016-06-29 DIAGNOSIS — E559 Vitamin D deficiency, unspecified: Secondary | ICD-10-CM | POA: Diagnosis not present

## 2016-06-29 DIAGNOSIS — E782 Mixed hyperlipidemia: Secondary | ICD-10-CM | POA: Diagnosis not present

## 2016-06-29 DIAGNOSIS — Z7982 Long term (current) use of aspirin: Secondary | ICD-10-CM | POA: Diagnosis not present

## 2016-06-29 DIAGNOSIS — F17210 Nicotine dependence, cigarettes, uncomplicated: Secondary | ICD-10-CM | POA: Diagnosis not present

## 2016-06-29 DIAGNOSIS — Z96652 Presence of left artificial knee joint: Secondary | ICD-10-CM | POA: Diagnosis not present

## 2016-06-29 DIAGNOSIS — I10 Essential (primary) hypertension: Secondary | ICD-10-CM | POA: Diagnosis not present

## 2016-06-29 DIAGNOSIS — R339 Retention of urine, unspecified: Secondary | ICD-10-CM | POA: Diagnosis not present

## 2016-06-29 DIAGNOSIS — B159 Hepatitis A without hepatic coma: Secondary | ICD-10-CM | POA: Diagnosis not present

## 2016-07-18 ENCOUNTER — Other Ambulatory Visit: Payer: Self-pay | Admitting: Internal Medicine

## 2016-07-31 DIAGNOSIS — Z96652 Presence of left artificial knee joint: Secondary | ICD-10-CM | POA: Diagnosis not present

## 2016-09-07 ENCOUNTER — Ambulatory Visit (INDEPENDENT_AMBULATORY_CARE_PROVIDER_SITE_OTHER): Payer: PPO | Admitting: Internal Medicine

## 2016-09-07 ENCOUNTER — Encounter: Payer: Self-pay | Admitting: Internal Medicine

## 2016-09-07 VITALS — BP 162/86 | HR 64 | Temp 97.5°F | Wt 166.0 lb

## 2016-09-07 DIAGNOSIS — R7303 Prediabetes: Secondary | ICD-10-CM

## 2016-09-07 DIAGNOSIS — E782 Mixed hyperlipidemia: Secondary | ICD-10-CM | POA: Diagnosis not present

## 2016-09-07 DIAGNOSIS — Z79899 Other long term (current) drug therapy: Secondary | ICD-10-CM

## 2016-09-07 DIAGNOSIS — E559 Vitamin D deficiency, unspecified: Secondary | ICD-10-CM | POA: Diagnosis not present

## 2016-09-07 DIAGNOSIS — R6889 Other general symptoms and signs: Secondary | ICD-10-CM | POA: Diagnosis not present

## 2016-09-07 DIAGNOSIS — M150 Primary generalized (osteo)arthritis: Secondary | ICD-10-CM | POA: Diagnosis not present

## 2016-09-07 DIAGNOSIS — Z0001 Encounter for general adult medical examination with abnormal findings: Secondary | ICD-10-CM

## 2016-09-07 DIAGNOSIS — I10 Essential (primary) hypertension: Secondary | ICD-10-CM | POA: Diagnosis not present

## 2016-09-07 DIAGNOSIS — Z0000 Encounter for general adult medical examination without abnormal findings: Secondary | ICD-10-CM

## 2016-09-07 DIAGNOSIS — M159 Polyosteoarthritis, unspecified: Secondary | ICD-10-CM

## 2016-09-07 MED ORDER — BUPROPION HCL ER (XL) 300 MG PO TB24: 300.0000 mg | ORAL_TABLET | Freq: Every day | ORAL | 1 refills | Status: DC

## 2016-09-07 NOTE — Progress Notes (Signed)
WELCOME TO MEDICARE and 3 month follow-up Assessment:    1. Essential hypertension -already on max dose of ziac -can ossibly increase enalapril -cont dash diet -exercise as tolerated -monitor at home -if continued elevation at home greater than 150/90 will likely need medication adjust -patient will call us  2. Mixed hyperlipidemia -cont diet and exercise -cont lipitor -monitor lipid panels biyearly  3. Prediabetes -cont diet and exercise  4. Vitamin D deficiency -cont Vit D   5. Medication management -labs due at next visit  6. Primary osteoarthritis involving multiple joints -cont lidocaine patches -improved since knee replacement    Over 30 minutes of exam, counseling, chart review, and critical decision making was performed  Future Appointments Date Time Provider [place]  12/18/2016 11:00 AM Unk Pinto, MD [facility] None     Plan:   During the course of the visit the patient was educated and counseled about appropriate screening and preventive services including:    Pneumococcal vaccine   Influenza vaccine  Prevnar 13  Td vaccine  Screening electrocardiogram  Colorectal cancer screening  Diabetes screening  Glaucoma screening  Nutrition counseling    Subjective:  [name] is a 66 y.o. female who presents for Medicare Annual Wellness Visit and 3 month follow up for HTN, hyperlipidemia, prediabetes, and vitamin D Def.   Her blood pressure has been controlled at home, today their BP is BP: (!) 162/86 She does not workout. She denies chest pain, shortness of breath, dizziness.  She is on cholesterol medication and denies myalgias. Her cholesterol is at goal. The cholesterol last visit was:   Lab Results  Component Value Date   CHOL 225 (H) 06/05/2016   HDL 99 06/05/2016   LDLCALC 108 (H) 06/05/2016   TRIG 90 06/05/2016   CHOLHDL 2.3 06/05/2016   She has a history of well controlled A1c levels with diet and exercise.   She is not exercising as much due to arthritis and bad weather, but she is trying to restart.   :  Lab Results  Component Value Date   HGBA1C 5.3 06/05/2016   Last GFR Lab Results  Component Value Date   GFRNONAA >60 06/19/2016     Lab Results  Component Value Date   GFRAA >60 06/19/2016   Patient is on Vitamin D supplement.   Lab Results  Component Value Date   VD25OH 72 06/05/2016        Medication Review: Current Outpatient Prescriptions on File Prior to Visit  Medication Sig Dispense Refill  . aspirin EC 325 MG tablet Take 1 tablet (325 mg total) by mouth 2 (two) times daily. 30 tablet 0  . atorvastatin (LIPITOR) 80 MG tablet Take 40 mg by mouth 3 (three) times a week.    . bisoprolol-hydrochlorothiazide (ZIAC) 10-6.25 MG tablet TAKE 1 TABLET BY MOUTH EVERY MORNING FOR BLOOD PRESSURE 90 tablet 1  . Cholecalciferol (VITAMIN D3) 5000 UNITS TABS Take 5,000 Units by mouth daily.    . clonazePAM (KLONOPIN) 0.5 MG tablet TAKE 1/2-1 TABLET BY MOUTH AT BEDTIME FOR SLEEP 90 tablet 1  . enalapril (VASOTEC) 20 MG tablet TAKE 1 TABLET BY MOUTH EVERY DAY 90 tablet 1  . fluticasone (FLONASE) 50 MCG/ACT nasal spray 1 Spray each nostril daily 48 g 1  . lidocaine (LIDODERM) 5 % Place 1 patch onto the skin daily as needed (pain). Remove & Discard patch within 12 hours or as directed by MD    . methocarbamol (ROBAXIN) 500 MG tablet Take  1 tablet (500 mg total) by mouth 2 (two) times daily with a meal. 60 tablet 0  . montelukast (SINGULAIR) 10 MG tablet TAKE 1 TABLET(10 MG) BY MOUTH DAILY 90 tablet 1  . Multiple Vitamins-Minerals (MULTI COMPLETE PO) Take 1 capsule by mouth daily.    . Omega-3 Fatty Acids (FISH OIL) 1000 MG CAPS Take 1 capsule by mouth daily.     No current facility-administered medications on file prior to visit.     Allergies: Allergies  Allergen Reactions  . No Known Allergies     Current Problems (verified) has Essential hypertension; Mixed hyperlipidemia;  Vitamin D deficiency; Prediabetes; and Medication management on her problem list.  Screening Tests Immunization History  Administered Date(s) Administered  . PPD Test 09/21/2014  . Pneumococcal Polysaccharide-23 02/07/2011, 01/26/2016  . Tdap 02/07/2011    Preventative care: Last colonoscopy: 2015 Mammogram:  2017, at [facility]: obgyn has been handling  Names of Other Physician/Practitioners you currently use: 1. [facility] here for primary care 2. [facility], eye doctor, last visit 2017 3. Dr. Alm Bustard, dentist, last visit 2017 Patient Care Team: Unk Pinto, MD as PCP - General (Internal Medicine)  Surgical: She  has a past surgical history that includes Appendectomy; Partial hysterectomy; Lumbar fusion (02/06/2012); Back surgery; Dilation and curettage of uterus; Total hip arthroplasty (06/02/2012); Colonoscopy; Total knee arthroplasty (Right, 12/15/2012); Total hip arthroplasty (Left, 01/25/2016); Total hip arthroplasty (Left, 01/25/2016); and Total knee arthroplasty (Left, 06/18/2016). Family Her family history includes Colon cancer in her mother and sister; Diabetes in her mother; Hypertension in her brother, brother, and father; Lung cancer in her brother. Social history  She reports that she has been smoking Cigarettes.  She has a 7.50 pack-year smoking history. She has never used smokeless tobacco. She reports that she drinks about 8.4 oz of alcohol per week . She reports that she uses drugs, including Marijuana.  MEDICARE WELLNESS OBJECTIVES: Physical activity: Current Exercise Habits: The patient does not participate in regular exercise at present, Exercise limited by: orthopedic condition(s) Cardiac risk factors: Cardiac Risk Factors include: advanced age (>29men, >40 women);hypertension;sedentary lifestyle Depression/mood screen:   Depression screen [facility] 2/9 09/07/2016  Decreased Interest 0  Down, Depressed, Hopeless 0  PHQ - 2  Score 0    ADLs:  In your present state of health, do you have any difficulty performing the following activities: 09/07/2016 06/12/2016  Hearing? N N  Vision? N N  Difficulty concentrating or making decisions? N N  Walking or climbing stairs? N Y  Dressing or bathing? N N  Doing errands, shopping? N N  Preparing Food and eating ? N -  Using the Toilet? N -  In the past six months, have you accidently leaked urine? N -  Do you have problems with loss of bowel control? N -  Managing your Medications? N -  Managing your Finances? N -  Housekeeping or managing your Housekeeping? N -  Some recent data might be hidden     Cognitive Testing  Alert? Yes  Normal Appearance?Yes  Oriented to person? Yes  Place? Yes   Time? Yes  Recall of three objects?  Yes  Can perform simple calculations? Yes  Displays appropriate judgment?Yes  Can read the correct time from a watch face?Yes  EOL planning: Does Patient Have a Medical Advance Directive?: No Would patient like information on creating a medical advance directive?: Yes (MAU/Ambulatory/Procedural Areas - Information given)   Objective:   Today's Vitals   09/07/16  1024  BP: (!) 162/86  Pulse: 64  Temp: 97.5 F (36.4 C)  Weight: 166 lb (75.3 kg)   Body mass index is 27.62 kg/m.  General appearance: alert, no distress, WD/WN, female HEENT: normocephalic, sclerae anicteric, TMs pearly, nares patent, no discharge or erythema, pharynx normal Oral cavity: MMM, no lesions Neck: supple, no lymphadenopathy, no thyromegaly, no masses Heart: RRR, normal S1, S2, no murmurs Lungs: CTA bilaterally, no wheezes, rhonchi, or rales Abdomen: +bs, soft, non tender, non distended, no masses, no hepatomegaly, no splenomegaly Musculoskeletal: nontender, no swelling, no obvious deformity Extremities: no edema, no cyanosis, no clubbing Pulses: 2+ symmetric, upper and lower extremities, normal cap refill Neurological: alert, oriented x 3, CN2-12  intact, strength normal upper extremities and lower extremities, sensation normal throughout, DTRs 2+ throughout, no cerebellar signs, gait normal Psychiatric: normal affect, behavior normal, pleasant   Medicare Attestation I have personally reviewed: The patient's medical and social history Their use of alcohol, tobacco or illicit drugs Their current medications and supplements The patient's functional ability including ADLs,fall risks, home safety risks, cognitive, and hearing and visual impairment Diet and physical activities Evidence for depression or mood disorders  The patient's weight, height, BMI, and visual acuity have been recorded in the chart.  I have made referrals, counseling, and provided education to the patient based on review of the above and I have provided the patient with a written personalized care plan for preventive services.     Starlyn Skeans, PA-C   09/09/2016

## 2016-09-18 ENCOUNTER — Telehealth: Payer: Self-pay | Admitting: *Deleted

## 2016-09-18 NOTE — Telephone Encounter (Signed)
Patient called with concerns about elevated BP readings.  Per Starlyn Skeans, PA-C orders, I called and advised patient to increase Enalapril to 40 mg QD and take with her Ziac.  Patient was advised to call back with BP readings in 1 week after increasing Enalapril dosage.  Patient expressed understanding.

## 2016-09-27 ENCOUNTER — Ambulatory Visit (INDEPENDENT_AMBULATORY_CARE_PROVIDER_SITE_OTHER): Payer: PPO | Admitting: Internal Medicine

## 2016-09-27 ENCOUNTER — Encounter: Payer: Self-pay | Admitting: Internal Medicine

## 2016-09-27 VITALS — BP 142/66 | HR 72 | Temp 98.2°F | Resp 16 | Ht 66.0 in | Wt 165.0 lb

## 2016-09-27 DIAGNOSIS — I10 Essential (primary) hypertension: Secondary | ICD-10-CM | POA: Diagnosis not present

## 2016-09-27 MED ORDER — AMLODIPINE BESYLATE 2.5 MG PO TABS: 2.5000 mg | ORAL_TABLET | Freq: Every day | ORAL | 0 refills | Status: DC

## 2016-09-27 NOTE — Progress Notes (Signed)
Assessment and Plan:   1. Essential hypertension -return to 20 mg of enalapril daily -cont ziac 10/6.25 -start amlodipine 2.5 mg  -keep BP log and call office in 1 week with readings.  Call sooner if worsening BP or if there is not improvement    HPI 66 y.o.female presents for evaluation and discussion about her blood pressure.  She has been checking blood pressures at home and is getting values alternating between 160/100.  She reports that she has been taking either 1-2 tablets of the enalapril.  She reports that she has been having headaches but she has been off of the medications.  She had taken robitussin for a week prior to the elevated numbers. She has been taking medications.  She has no CP, Shortness of breath, no leg swelling.  No blurry vision no double vision.  She reports that she has been using an electronic cuff.  The brand is orion.  She reports that her husband has been checking his and his blood pressure is doing alright.    Past Medical History:  Diagnosis Date  . Allergy    enviromental  . Anxiety   . Arthritis    osteoarthritis  . COPD (chronic obstructive pulmonary disease) (HCC)    mild  . Diabetes mellitus without complication ([place])    controlled by diet  . Environmental allergies   . Hepatitis    hepatitis A  . Hyperlipidemia   . Hypertension   . PONV (postoperative nausea and vomiting)    second surgery no problems last surgery  . Urgency of urination    Takes oxybutynin; under control at this time     Allergies  Allergen Reactions  . No Known Allergies       Current Outpatient Prescriptions on File Prior to Visit  Medication Sig Dispense Refill  . aspirin EC 325 MG tablet Take 1 tablet (325 mg total) by mouth 2 (two) times daily. 30 tablet 0  . atorvastatin (LIPITOR) 80 MG tablet Take 40 mg by mouth 3 (three) times a week.    . bisoprolol-hydrochlorothiazide (ZIAC) 10-6.25 MG tablet TAKE 1 TABLET BY MOUTH EVERY MORNING FOR BLOOD PRESSURE 90  tablet 1  . buPROPion (WELLBUTRIN XL) 300 MG 24 hr tablet Take 1 tablet (300 mg total) by mouth daily. 90 tablet 1  . Cholecalciferol (VITAMIN D3) 5000 UNITS TABS Take 5,000 Units by mouth daily.    . clonazePAM (KLONOPIN) 0.5 MG tablet TAKE 1/2-1 TABLET BY MOUTH AT BEDTIME FOR SLEEP 90 tablet 1  . enalapril (VASOTEC) 20 MG tablet TAKE 1 TABLET BY MOUTH EVERY DAY 90 tablet 1  . fluticasone (FLONASE) 50 MCG/ACT nasal spray 1 Spray each nostril daily 48 g 1  . lidocaine (LIDODERM) 5 % Place 1 patch onto the skin daily as needed (pain). Remove & Discard patch within 12 hours or as directed by MD    . methocarbamol (ROBAXIN) 500 MG tablet Take 1 tablet (500 mg total) by mouth 2 (two) times daily with a meal. 60 tablet 0  . montelukast (SINGULAIR) 10 MG tablet TAKE 1 TABLET(10 MG) BY MOUTH DAILY 90 tablet 1  . Multiple Vitamins-Minerals (MULTI COMPLETE PO) Take 1 capsule by mouth daily.    . Omega-3 Fatty Acids (FISH OIL) 1000 MG CAPS Take 1 capsule by mouth daily.     No current facility-administered medications on file prior to visit.    Review of Systems  Constitutional: Negative for chills, fever and malaise/fatigue.  HENT: Negative for congestion,  ear pain and sore throat.   Eyes: Negative.   Respiratory: Negative for cough, shortness of breath and wheezing.   Cardiovascular: Negative for chest pain, palpitations and leg swelling.  Gastrointestinal: Negative for abdominal pain, blood in stool, constipation, diarrhea, heartburn and melena.  Genitourinary: Negative.   Skin: Negative.   Neurological: Negative for dizziness, sensory change, loss of consciousness and headaches.  Psychiatric/Behavioral: Negative for depression. The patient is not nervous/anxious and does not have insomnia.      Physical Exam: Filed Weights   09/27/16 1416  Weight: 165 lb (74.8 kg)   BP (!) 142/66   Pulse 72   Temp 98.2 F (36.8 C) (Temporal)   Resp 16   Ht 5\' 6"  (1.676 m)   Wt 165 lb (74.8 kg)   BMI  26.63 kg/m  General Appearance: Well developed well nourished, non-toxic appearing in no apparent distress. Eyes: PERRLA, EOMs, conjunctiva w/ no swelling or erythema or discharge Sinuses: No Frontal/maxillary tenderness ENT/Mouth: Ear canals clear without swelling or erythema.  TM's normal bilaterally with no retractions, bulging, or loss of landmarks.   Neck: Supple, thyroid normal, no notable JVD  Respiratory: Respiratory effort normal, Clear breath sounds anteriorly and posteriorly bilaterally without rales, rhonchi, wheezing or stridor. No retractions or accessory muscle usage. Cardio: RRR with no MRGs.   Abdomen: Soft, + BS.  Non tender, no guarding, rebound, hernias, masses.  Musculoskeletal: Full ROM, 5/5 strength, normal gait.  Skin: Warm, dry without rashes  Neuro: Awake and oriented X 3, Cranial nerves intact. Normal muscle tone, no cerebellar symptoms. Sensation intact.  Psych: normal affect, Insight and Judgment appropriate.     Starlyn Skeans, PA-C 2:53 PM [facility]

## 2016-09-27 NOTE — Patient Instructions (Signed)
Amlodipine tablets What is this medicine? AMLODIPINE (am LOE di peen) is a calcium-channel blocker. It affects the amount of calcium found in your heart and muscle cells. This relaxes your blood vessels, which can reduce the amount of work the heart has to do. This medicine is used to lower high blood pressure. It is also used to prevent chest pain. This medicine may be used for other purposes; ask your health care provider or pharmacist if you have questions. COMMON BRAND NAME(S): Norvasc What should I tell my health care provider before I take this medicine? They need to know if you have any of these conditions: -heart problems like heart failure or aortic stenosis -liver disease -an unusual or allergic reaction to amlodipine, other medicines, foods, dyes, or preservatives -pregnant or trying to get pregnant -breast-feeding How should I use this medicine? Take this medicine by mouth with a glass of water. Follow the directions on the prescription label. Take your medicine at regular intervals. Do not take more medicine than directed. Talk to your pediatrician regarding the use of this medicine in children. Special care may be needed. This medicine has been used in children as young as 6. Persons over 26 years old may have a stronger reaction to this medicine and need smaller doses. Overdosage: If you think you have taken too much of this medicine contact a poison control center or emergency room at once. NOTE: This medicine is only for you. Do not share this medicine with others. What if I miss a dose? If you miss a dose, take it as soon as you can. If it is almost time for your next dose, take only that dose. Do not take double or extra doses. What may interact with this medicine? -herbal or dietary supplements -local or general anesthetics -medicines for high blood pressure -medicines for prostate problems -rifampin This list may not describe all possible interactions. Give your health  care provider a list of all the medicines, herbs, non-prescription drugs, or dietary supplements you use. Also tell them if you smoke, drink alcohol, or use illegal drugs. Some items may interact with your medicine. What should I watch for while using this medicine? Visit your doctor or health care professional for regular check ups. Check your blood pressure and pulse rate regularly. Ask your health care professional what your blood pressure and pulse rate should be, and when you should contact him or her. This medicine may make you feel confused, dizzy or lightheaded. Do not drive, use machinery, or do anything that needs mental alertness until you know how this medicine affects you. To reduce the risk of dizzy or fainting spells, do not sit or stand up quickly, especially if you are an older patient. Avoid alcoholic drinks; they can make you more dizzy. Do not suddenly stop taking amlodipine. Ask your doctor or health care professional how you can gradually reduce the dose. What side effects may I notice from receiving this medicine? Side effects that you should report to your doctor or health care professional as soon as possible: -allergic reactions like skin rash, itching or hives, swelling of the face, lips, or tongue -breathing problems -changes in vision or hearing -chest pain -fast, irregular heartbeat -swelling of legs or ankles Side effects that usually do not require medical attention (report to your doctor or health care professional if they continue or are bothersome): -dry mouth -facial flushing -nausea, vomiting -stomach gas, pain -tired, weak -trouble sleeping This list may not describe all possible side  effects. Call your doctor for medical advice about side effects. You may report side effects to FDA at 1-800-FDA-1088. °Where should I keep my medicine? °Keep out of the reach of children. °Store at room temperature between 59 and 86 degrees F (15 and 30 degrees C). Protect from  light. Keep container tightly closed. Throw away any unused medicine after the expiration date. °NOTE: This sheet is a summary. It may not cover all possible information. If you have questions about this medicine, talk to your doctor, pharmacist, or health care provider. °© 2018 [employer]vier/Gold Standard (2012-05-30 11:40:58) ° °

## 2016-10-02 ENCOUNTER — Other Ambulatory Visit: Payer: Self-pay | Admitting: Internal Medicine

## 2016-10-11 ENCOUNTER — Other Ambulatory Visit: Payer: Self-pay | Admitting: Internal Medicine

## 2016-10-30 ENCOUNTER — Telehealth: Payer: Self-pay | Admitting: Physician Assistant

## 2016-10-30 NOTE — Telephone Encounter (Signed)
Patient started on norvasc 2.5 in addition to other BP meds, Bp at home went from 180/110 to 164/98- increase norvasc to BID, one in the AM and one in the PM. Call with BP Thursday/Friday.  Go to the ER if any CP, SOB, nausea, dizziness, severe HA, changes vision/speech

## 2016-11-03 DIAGNOSIS — H524 Presbyopia: Secondary | ICD-10-CM | POA: Diagnosis not present

## 2016-11-05 ENCOUNTER — Ambulatory Visit (INDEPENDENT_AMBULATORY_CARE_PROVIDER_SITE_OTHER): Payer: PPO | Admitting: Physician Assistant

## 2016-11-05 VITALS — BP 142/80 | HR 70 | Temp 97.5°F | Ht 66.0 in | Wt 166.4 lb

## 2016-11-05 DIAGNOSIS — J320 Chronic maxillary sinusitis: Secondary | ICD-10-CM | POA: Diagnosis not present

## 2016-11-05 MED ORDER — PREDNISONE 20 MG PO TABS: ORAL_TABLET | ORAL | 0 refills | Status: DC

## 2016-11-05 MED ORDER — DEXAMETHASONE SODIUM PHOSPHATE 100 MG/10ML IJ SOLN
10.0000 mg | Freq: Once | INTRAMUSCULAR | Status: AC
  Administered 2016-11-05: 10 mg via INTRAMUSCULAR

## 2016-11-05 MED ORDER — AZITHROMYCIN 250 MG PO TABS: ORAL_TABLET | ORAL | 1 refills | Status: DC

## 2016-11-05 NOTE — Progress Notes (Signed)
Subjective:    Patient ID: [name], female    DOB: 1951/03/19, 66 y.o.   MRN: [id number]  HPI 66 y.o. WF presents with sinus congestion, mucus, ear ringing/fullness, dizziness, HA x 2 weeks.   Blood pressure (!) 142/80, pulse 70, temperature 97.5 F (36.4 C), height 5\' 6"  (1.676 m), weight 166 lb 6.4 oz (75.5 kg), SpO2 96 %.  Medications Current Outpatient Prescriptions on File Prior to Visit  Medication Sig  . amLODipine (NORVASC) 2.5 MG tablet Take 1 tablet (2.5 mg total) by mouth daily.  Marland Kitchen aspirin EC 325 MG tablet Take 1 tablet (325 mg total) by mouth 2 (two) times daily.  Marland Kitchen atorvastatin (LIPITOR) 80 MG tablet Take 40 mg by mouth 3 (three) times a week.  . bisoprolol-hydrochlorothiazide (ZIAC) 10-6.25 MG tablet TAKE 1 TABLET BY MOUTH EVERY MORNING FOR BLOOD PRESSURE  . buPROPion (WELLBUTRIN XL) 300 MG 24 hr tablet Take 1 tablet (300 mg total) by mouth daily.  . Cholecalciferol (VITAMIN D3) 5000 UNITS TABS Take 5,000 Units by mouth daily.  . clonazePAM (KLONOPIN) 0.5 MG tablet TAKE 1/2-1 TABLET BY MOUTH AT BEDTIME FOR SLEEP  . enalapril (VASOTEC) 20 MG tablet TAKE 1 TABLET BY MOUTH EVERY DAY  . fluticasone (FLONASE) 50 MCG/ACT nasal spray 1 Spray each nostril daily  . lidocaine (LIDODERM) 5 % Place 1 patch onto the skin daily as needed (pain). Remove & Discard patch within 12 hours or as directed by MD  . meloxicam (MOBIC) 15 MG tablet TAKE 1 TABLET BY MOUTH DAILY  . methocarbamol (ROBAXIN) 500 MG tablet Take 1 tablet (500 mg total) by mouth 2 (two) times daily with a meal.  . montelukast (SINGULAIR) 10 MG tablet TAKE 1 TABLET(10 MG) BY MOUTH DAILY  . Multiple Vitamins-Minerals (MULTI COMPLETE PO) Take 1 capsule by mouth daily.  . Omega-3 Fatty Acids (FISH OIL) 1000 MG CAPS Take 1 capsule by mouth daily.   No current facility-administered medications on file prior to visit.     Problem list She has Essential hypertension; Mixed hyperlipidemia; Vitamin D deficiency;  Prediabetes; and Medication management on her problem list.   Review of Systems  Constitutional: Negative for chills and diaphoresis.  HENT: Positive for congestion, postnasal drip, sinus pressure and sneezing. Negative for ear pain and sore throat.   Respiratory: Positive for cough. Negative for chest tightness, shortness of breath and wheezing.   Cardiovascular: Negative.   Gastrointestinal: Negative.   Genitourinary: Negative.   Musculoskeletal: Negative for neck pain.  Neurological: Positive for headaches.       Objective:   Physical Exam  Constitutional: She appears well-developed and well-nourished.  HENT:  Head: Normocephalic and atraumatic.  Right Ear: External ear normal.  Nose: Right sinus exhibits maxillary sinus tenderness. Right sinus exhibits no frontal sinus tenderness. Left sinus exhibits maxillary sinus tenderness. Left sinus exhibits no frontal sinus tenderness.  Eyes: Conjunctivae and EOM are normal.  Neck: Normal range of motion. Neck supple.  Cardiovascular: Normal rate, regular rhythm, normal heart sounds and intact distal pulses.   Pulmonary/Chest: Effort normal and breath sounds normal. No respiratory distress. She has no wheezes.  Abdominal: Soft. Bowel sounds are normal.  Lymphadenopathy:    She has cervical adenopathy.  Skin: Skin is warm and dry.      Assessment & Plan:  1. Maxillary sinusitis, unspecified chronicity Will hold the zpak and take if she is not getting better, increase fluids, rest, cont allergy pill - azithromycin (ZITHROMAX) 250 MG tablet; Take  2 tablets (500 mg) on  Day 1,  followed by 1 tablet (250 mg) once daily on Days 2 through 5.  Dispense: 6 each; Refill: 1 - predniSONE (DELTASONE) 20 MG tablet; 2 tablets daily for 3 days, 1 tablet daily for 4 days.  Dispense: 10 tablet; Refill: 0 - dexamethasone (DECADRON) injection 10 mg; Inject 1 mL (10 mg total) into the muscle once.  The patient was advised to call immediately if she has  any concerning symptoms in the interval. The patient voices understanding of current treatment options and is in agreement with the current care plan.The patient knows to call the clinic with any problems, questions or concerns or go to the ER if any further progression of symptoms.    Future Appointments Date Time Provider [place]  11/14/2016 8:45 AM Adelina Mings, MD [facility] None  12/18/2016 11:00 AM Unk Pinto, MD [facility] None

## 2016-11-05 NOTE — Patient Instructions (Signed)
Your ears and sinuses are connected by the eustachian tube. When your sinuses are inflamed, this can close off the tube and cause fluid to collect in your middle ear. This can then cause dizziness, popping, clicking, ringing, and echoing in your ears. This is often NOT an infection and does NOT require antibiotics, it is caused by inflammation so the treatments help the inflammation. This can take a long time to get better so please be patient.  Here are things you can do to help with this: - Try the Flonase or Nasonex. Remember to spray each nostril twice towards the outer part of your eye.  Do not sniff but instead pinch your nose and tilt your head back to help the medicine get into your sinuses.  The best time to do this is at bedtime.Stop if you get blurred vision or nose bleeds.  -While drinking fluids, pinch and hold nose close and swallow, to help open eustachian tubes to drain fluid behind ear drums. -Please pick one of the over the counter allergy medications below and take it once daily for allergies.  It will also help with fluid behind ear drums. Claritin or loratadine cheapest but likely the weakest  Zyrtec or certizine at night because it can make you sleepy The strongest is allegra or fexafinadine  Cheapest at [employer], [employer], [employer] -can use decongestant over the counter, please do not use if you have high blood pressure or certain heart conditions.   if worsening HA, changes vision/speech, imbalance, weakness go to the ER   HOW TO TREAT VIRAL COUGH AND COLD SYMPTOMS:  -Symptoms usually last at least 1 week with the worst symptoms being around day 4.  - colds usually start with a sore throat and end with a cough, and the cough can take 2 weeks to get better.  -No antibiotics are needed for colds, flu, sore throats, cough, bronchitis UNLESS symptoms are longer than 7 days OR if you are getting better then get drastically worse.  -There are a lot of combination medications (Dayquil,  Nyquil, Vicks 44, tyelnol cold and sinus, ETC). Please look at the ingredients on the back so that you are treating the correct symptoms and not doubling up on medications/ingredients.    Medicines you can use  Nasal congestion  - pseudoephedrine (Sudafed)- behind the counter, do not use if you have high blood pressure, medicine that have -D in them.  - phenylephrine (Sudafed PE) -Dextormethorphan + chlorpheniramine (Coridcidin HBP)- okay if you have high blood pressure -Oxymetazoline (Afrin) nasal spray- LIMIT to 3 days -Saline nasal spray -Neti pot (used distilled or bottled water)  Ear pain/congestion  -pseudoephedrine (sudafed) - Nasonex/flonase nasal spray  Fever  -Acetaminophen (Tyelnol) -Ibuprofen (Advil, motrin, aleve)  Sore Throat  -Acetaminophen (Tyelnol) -Ibuprofen (Advil, motrin, aleve) -Drink a lot of water -Gargle with salt water - Rest your voice (don't talk) -Throat sprays -Cough drops  Body Aches  -Acetaminophen (Tyelnol) -Ibuprofen (Advil, motrin, aleve)  Headache  -Acetaminophen (Tyelnol) -Ibuprofen (Advil, motrin, aleve) - Exedrin, Exedrin Migraine  Allergy symptoms (cough, sneeze, runny nose, itchy eyes) -Claritin or loratadine cheapest but likely the weakest  -Zyrtec or certizine at night because it can make you sleepy -The strongest is allegra or fexafinadine  Cheapest at [employer], [employer], [employer]  Cough  -Dextromethorphan (Delsym)- medicine that has DM in it -Guafenesin (Mucinex/Robitussin) - cough drops - drink lots of water  Chest Congestion  -Guafenesin (Mucinex/Robitussin)  Red Itchy Eyes  - Naphcon-A  Upset Stomach  -   Bland diet (nothing spicy, greasy, fried, and high acid foods like tomatoes, oranges, berries) -OKAY- cereal, bread, soup, crackers, rice -Eat smaller more frequent meals -reduce caffeine, no alcohol -Loperamide (Imodium-AD) if diarrhea -Prevacid for heart burn  General health when sick  -Hydration -wash your  hands frequently -keep surfaces clean -change pillow cases and sheets often -Get fresh air but do not exercise strenuously -Vitamin D, double up on it - Vitamin C -Zinc       

## 2016-11-08 ENCOUNTER — Encounter: Payer: Self-pay | Admitting: Internal Medicine

## 2016-11-09 ENCOUNTER — Encounter: Payer: Self-pay | Admitting: Internal Medicine

## 2016-11-09 DIAGNOSIS — H25812 Combined forms of age-related cataract, left eye: Secondary | ICD-10-CM | POA: Diagnosis not present

## 2016-11-14 ENCOUNTER — Ambulatory Visit: Payer: 59 | Admitting: Allergy and Immunology

## 2016-11-14 ENCOUNTER — Other Ambulatory Visit: Payer: Self-pay

## 2016-11-14 MED ORDER — AMLODIPINE BESYLATE 2.5 MG PO TABS: 2.5000 mg | ORAL_TABLET | Freq: Every day | ORAL | 0 refills | Status: DC

## 2016-11-15 DIAGNOSIS — H2512 Age-related nuclear cataract, left eye: Secondary | ICD-10-CM | POA: Diagnosis not present

## 2016-11-15 DIAGNOSIS — H25812 Combined forms of age-related cataract, left eye: Secondary | ICD-10-CM | POA: Diagnosis not present

## 2016-12-04 DIAGNOSIS — M25562 Pain in left knee: Secondary | ICD-10-CM | POA: Diagnosis not present

## 2016-12-04 DIAGNOSIS — Z96652 Presence of left artificial knee joint: Secondary | ICD-10-CM | POA: Diagnosis not present

## 2016-12-04 DIAGNOSIS — Z96651 Presence of right artificial knee joint: Secondary | ICD-10-CM | POA: Diagnosis not present

## 2016-12-04 DIAGNOSIS — Z09 Encounter for follow-up examination after completed treatment for conditions other than malignant neoplasm: Secondary | ICD-10-CM | POA: Diagnosis not present

## 2016-12-06 ENCOUNTER — Other Ambulatory Visit: Payer: Self-pay | Admitting: Physician Assistant

## 2016-12-18 ENCOUNTER — Encounter: Payer: Self-pay | Admitting: Internal Medicine

## 2016-12-18 ENCOUNTER — Ambulatory Visit (INDEPENDENT_AMBULATORY_CARE_PROVIDER_SITE_OTHER): Payer: PPO | Admitting: Internal Medicine

## 2016-12-18 VITALS — BP 140/72 | HR 60 | Temp 97.3°F | Resp 16 | Ht 66.0 in | Wt 164.0 lb

## 2016-12-18 DIAGNOSIS — I10 Essential (primary) hypertension: Secondary | ICD-10-CM | POA: Diagnosis not present

## 2016-12-18 DIAGNOSIS — Z79899 Other long term (current) drug therapy: Secondary | ICD-10-CM | POA: Diagnosis not present

## 2016-12-18 DIAGNOSIS — E782 Mixed hyperlipidemia: Secondary | ICD-10-CM | POA: Diagnosis not present

## 2016-12-18 DIAGNOSIS — Z136 Encounter for screening for cardiovascular disorders: Secondary | ICD-10-CM | POA: Diagnosis not present

## 2016-12-18 DIAGNOSIS — Z1212 Encounter for screening for malignant neoplasm of rectum: Secondary | ICD-10-CM

## 2016-12-18 DIAGNOSIS — E559 Vitamin D deficiency, unspecified: Secondary | ICD-10-CM

## 2016-12-18 DIAGNOSIS — R7303 Prediabetes: Secondary | ICD-10-CM | POA: Diagnosis not present

## 2016-12-18 LAB — CBC WITH DIFFERENTIAL/PLATELET
BASOS PCT: 0 %
Basophils Absolute: 0 cells/uL (ref 0–200)
EOS PCT: 2 %
Eosinophils Absolute: 124 cells/uL (ref 15–500)
HCT: 39.1 % (ref 35.0–45.0)
Hemoglobin: 13 g/dL (ref 11.7–15.5)
LYMPHS PCT: 39 %
Lymphs Abs: 2418 cells/uL (ref 850–3900)
MCH: 26.9 pg — ABNORMAL LOW (ref 27.0–33.0)
MCHC: 33.2 g/dL (ref 32.0–36.0)
MCV: 80.8 fL (ref 80.0–100.0)
MPV: 10 fL (ref 7.5–12.5)
Monocytes Absolute: 496 cells/uL (ref 200–950)
Monocytes Relative: 8 %
Neutro Abs: 3162 cells/uL (ref 1500–7800)
Neutrophils Relative %: 51 %
PLATELETS: 238 10*3/uL (ref 140–400)
RBC: 4.84 MIL/uL (ref 3.80–5.10)
RDW: 15.6 % — AB (ref 11.0–15.0)
WBC: 6.2 10*3/uL (ref 3.8–10.8)

## 2016-12-18 NOTE — Progress Notes (Signed)
[facility] Unk Pinto, M.D.      Uvaldo Bristle. Silverio Lay, P.A.-[facility]                [address] [place], [place] [address] Telephone [phone] Telefax [phone]  Comprehensive Evaluation &  Examination     This very nice 66 y.o. Carney Harder Am [place]  presents for a comprehensive evaluation and management of multiple medical co-morbidities.  Patient has been followed for HTN, T2_NIDDM  Prediabetes, Hyperlipidemia and Vitamin D Deficiency. In Dec 2017, she underwent Lt TKR.      HTN predates since 30. Patient's BP has been controlled at home and patient denies any cardiac symptoms as chest pain, palpitations, shortness of breath, dizziness or ankle swelling. Today's BP is at goal - 140/72.      Patient's hyperlipidemia is controlled with diet and medications. Patient denies myalgias or other medication SE's. Last lipids were not at goal: Lab Results  Component Value Date   CHOL 225 (H) 06/05/2016   HDL 99 06/05/2016   LDLCALC 108 (H) 06/05/2016   TRIG 90 06/05/2016   CHOLHDL 2.3 06/05/2016      Patient has prediabetes(A1c 5.7% in I/2017)  and patient denies reactive hypoglycemic symptoms, visual blurring, diabetic polys, or paresthesias. Last A1c was at goal: Lab Results  Component Value Date   HGBA1C 5.3 06/05/2016      Finally, patient has history of Vitamin D Deficiency and last Vitamin D was  Lab Results  Component Value Date   VD25OH 72 06/05/2016   Current Outpatient Prescriptions on File Prior to Visit  Medication Sig  . amLODipine (NORVASC) 2.5 MG tablet TAKE 1 TABLET(2.5 MG) BY MOUTH DAILY  . aspirin EC 325 MG tablet Take 1 tablet (325 mg total) by mouth 2 (two) times daily.  Marland Kitchen atorvastatin (LIPITOR) 80 MG tablet Take 40 mg by mouth 3 (three) times a week.  . bisoprolol-hydrochlorothiazide (ZIAC) 10-6.25 MG tablet TAKE 1 TABLET BY MOUTH EVERY MORNING FOR BLOOD PRESSURE   . buPROPion (WELLBUTRIN XL) 300 MG 24 hr tablet Take 1 tablet (300 mg total) by mouth daily.  . Cholecalciferol (VITAMIN D3) 5000 UNITS TABS Take 5,000 Units by mouth daily.  . enalapril (VASOTEC) 20 MG tablet TAKE 1 TABLET BY MOUTH EVERY DAY  . fluticasone (FLONASE) 50 MCG/ACT nasal spray 1 Spray each nostril daily  . meloxicam (MOBIC) 15 MG tablet TAKE 1 TABLET BY MOUTH DAILY  . methocarbamol (ROBAXIN) 500 MG tablet Take 1 tablet (500 mg total) by mouth 2 (two) times daily with a meal.  . montelukast (SINGULAIR) 10 MG tablet TAKE 1 TABLET(10 MG) BY MOUTH DAILY  . Multiple Vitamins-Minerals (MULTI COMPLETE PO) Take 1 capsule by mouth daily.  . Omega-3 Fatty Acids (FISH OIL) 1000 MG CAPS Take 1 capsule by mouth daily.  . clonazePAM (KLONOPIN) 0.5 MG tablet TAKE 1/2-1 TABLET BY MOUTH AT BEDTIME FOR SLEEP   No current facility-administered medications on file prior to visit.    Allergies  Allergen Reactions  . No Known Allergies    Past Medical History:  Diagnosis Date  . Allergy    enviromental  . Anxiety   . Arthritis    osteoarthritis  . COPD (chronic obstructive pulmonary disease) (HCC)    mild  . Diabetes mellitus without complication ([place])    controlled by diet  . Environmental  allergies   . Hepatitis    hepatitis A  . Hyperlipidemia   . Hypertension   . PONV (postoperative nausea and vomiting)    second surgery no problems last surgery  . Urgency of urination    Takes oxybutynin; under control at this time   Health Maintenance  Topic Date Due  . MAMMOGRAM  06/17/2011  . DEXA SCAN  10/01/2015  . PNA vac Low Risk Adult (2 of 2 - PCV13) 01/25/2017  . INFLUENZA VACCINE  02/13/2017  . TETANUS/TDAP  02/06/2021  . COLONOSCOPY  02/09/2024  . Hepatitis C Screening  Completed   Immunization History  Administered Date(s) Administered  . PPD Test 09/21/2014  . Pneumococcal Polysaccharide-23 02/07/2011, 01/26/2016  . Tdap 02/07/2011   Past Surgical History:  Procedure  Laterality Date  . APPENDECTOMY     removed as child  . BACK SURGERY     lumbar fusion  . COLONOSCOPY    . DILATION AND CURETTAGE OF UTERUS    . LUMBAR FUSION  02/06/2012   L 4  L5  . PARTIAL HYSTERECTOMY    . TOTAL HIP ARTHROPLASTY  06/02/2012   Procedure: TOTAL HIP ARTHROPLASTY;  Surgeon: Kerin Salen, MD;  Location: [facility];  Service: Orthopedics;  Laterality: Right;  . TOTAL HIP ARTHROPLASTY Left 01/25/2016  . TOTAL HIP ARTHROPLASTY Left 01/25/2016   Procedure: LEFT TOTAL HIP ARTHROPLASTY;  Surgeon: Frederik Pear, MD;  Location: [facility];  Service: Orthopedics;  Laterality: Left;  . TOTAL KNEE ARTHROPLASTY Right 12/15/2012   Procedure: TOTAL KNEE ARTHROPLASTY;  Surgeon: Kerin Salen, MD;  Location: [facility];  Service: Orthopedics;  Laterality: Right;  . TOTAL KNEE ARTHROPLASTY Left 06/18/2016   Procedure: TOTAL KNEE ARTHROPLASTY;  Surgeon: Frederik Pear, MD;  Location: [facility];  Service: Orthopedics;  Laterality: Left;   Family History  Problem Relation Age of Onset  . Diabetes Mother   . Colon cancer Mother   . Hypertension Father   . Colon cancer Sister   . Lung cancer Brother   . Hypertension Brother   . Hypertension Brother   . Esophageal cancer Neg Hx   . Rectal cancer Neg Hx   . Stomach cancer Neg Hx    Social History  Substance Use Topics  . Smoking status: Current Every Day Smoker    Packs/day: 0.25    Years: 30.00    Types: Cigarettes  . Smokeless tobacco: Never Used  . Alcohol use 8.4 oz/week    14 Glasses of wine per week     Comment: either a glass of wine a night or 2 beers-advised none within 48 hrs of surgery    ROS Constitutional: Denies fever, chills, weight loss/gain, headaches, insomnia,  night sweats, and change in appetite. Does c/o fatigue. Eyes: Denies redness, blurred vision, diplopia, discharge, itchy, watery eyes.  ENT: Denies discharge, congestion, post nasal drip, epistaxis, sore throat, earache, hearing loss, dental pain, Tinnitus, Vertigo, Sinus pain,  snoring.  Cardio: Denies chest pain, palpitations, irregular heartbeat, syncope, dyspnea, diaphoresis, orthopnea, PND, claudication, edema Respiratory: denies cough, dyspnea, DOE, pleurisy, hoarseness, laryngitis, wheezing.  Gastrointestinal: Denies dysphagia, heartburn, reflux, water brash, pain, cramps, nausea, vomiting, bloating, diarrhea, constipation, hematemesis, melena, hematochezia, jaundice, hemorrhoids Genitourinary: Denies dysuria, frequency, urgency, nocturia, hesitancy, discharge, hematuria, flank pain Breast: Breast lumps, nipple discharge, bleeding.  Musculoskeletal: Denies arthralgia, myalgia, stiffness, Jt. Swelling, pain, limp, and strain/sprain. Denies falls. Skin: Denies puritis, rash, hives, warts, acne, eczema, changing in skin lesion Neuro: No weakness, tremor, incoordination, spasms,  paresthesia, pain Psychiatric: Denies confusion, memory loss, sensory loss. Denies Depression. Endocrine: Denies change in weight, skin, hair change, nocturia, and paresthesia, diabetic polys, visual blurring, hyper / hypo glycemic episodes.  Heme/Lymph: No excessive bleeding, bruising, enlarged lymph nodes.  Physical Exam  BP 140/72   Pulse 60   Temp 97.3 F (36.3 C)   Resp 16   Ht 5\' 6"  (1.676 m)   Wt 164 lb (74.4 kg)   BMI 26.47 kg/m   General Appearance: Well nourished, well groomed and in no apparent distress.  Eyes: PERRLA, EOMs, conjunctiva no swelling or erythema, normal fundi and vessels. Sinuses: No frontal/maxillary tenderness ENT/Mouth: EACs patent / TMs  nl. Nares clear without erythema, swelling, mucoid exudates. Oral hygiene is good. No erythema, swelling, or exudate. Tongue normal, non-obstructing. Tonsils not swollen or erythematous. Hearing normal.  Neck: Supple, thyroid normal. No bruits, nodes or JVD. Respiratory: Respiratory effort normal.  BS equal and clear bilateral without rales, rhonci, wheezing or stridor. Cardio: Heart sounds are normal with regular rate  and rhythm and no murmurs, rubs or gallops. Peripheral pulses are normal and equal bilaterally without edema. No aortic or femoral bruits. Chest: symmetric with normal excursions and percussion. Breasts: Symmetric, without lumps, nipple discharge, retractions, or fibrocystic changes.  Abdomen: Flat, soft with bowel sounds active. Nontender, no guarding, rebound, hernias, masses, or organomegaly.  Lymphatics: Non tender without lymphadenopathy.  Musculoskeletal: Full ROM all peripheral extremities, joint stability, 5/5 strength, and normal gait. Skin: Warm and dry without rashes, lesions, cyanosis, clubbing or  ecchymosis.  Neuro: Cranial nerves intact, reflexes equal bilaterally. Normal muscle tone, no cerebellar symptoms. Sensation intact.  Pysch: Alert and oriented X 3, normal affect, Insight and Judgment appropriate.   Assessment and Plan  1. Essential hypertension  - EKG 12-Lead - Urinalysis, Routine w reflex microscopic - Microalbumin / creatinine urine ratio - CBC with Differential/Platelet - BASIC METABOLIC PANEL WITH GFR - Magnesium - TSH  2. Hyperlipidemia, mixed  - EKG 12-Lead - Hepatic function panel - Lipid panel - TSH  3. Prediabetes  - EKG 12-Lead - Hemoglobin A1c - Insulin, random  4. Vitamin D deficiency  - VITAMIN D 25 Hydroxy   5. Screening for rectal cancer  - POC Hemoccult Bld/Stl   6. Screening for ischemic heart disease  - EKG 12-Lead  7. Medication management  - Urinalysis, Routine w reflex microscopic - Microalbumin / creatinine urine ratio - CBC with Differential/Platelet - BASIC METABOLIC PANEL WITH GFR - Hepatic function panel - Magnesium - Lipid panel - TSH - Hemoglobin A1c - Insulin, random - VITAMIN D 25 Hydroxy       Patient was counseled in prudent diet to achieve/maintain BMI less than 25 for weight control, BP monitoring, regular exercise and medications. Discussed med's effects and SE's. Screening labs and tests as  requested with regular follow-up as recommended. Over 40 minutes of exam, counseling, chart review and high complex critical decision making was performed.

## 2016-12-18 NOTE — Patient Instructions (Signed)

## 2016-12-19 LAB — URINALYSIS, ROUTINE W REFLEX MICROSCOPIC

## 2016-12-19 LAB — HEPATIC FUNCTION PANEL
ALT: 11 U/L (ref 6–29)
AST: 18 U/L (ref 10–35)
Albumin: 4.2 g/dL (ref 3.6–5.1)
Alkaline Phosphatase: 64 U/L (ref 33–130)
BILIRUBIN DIRECT: 0.1 mg/dL (ref <=0.2)
BILIRUBIN INDIRECT: 0.6 mg/dL (ref 0.2–1.2)
BILIRUBIN TOTAL: 0.7 mg/dL (ref 0.2–1.2)
TOTAL PROTEIN: 6.7 g/dL (ref 6.1–8.1)

## 2016-12-19 LAB — BASIC METABOLIC PANEL WITH GFR
BUN: 11 mg/dL (ref 7–25)
CALCIUM: 9.4 mg/dL (ref 8.6–10.4)
CO2: 21 mmol/L (ref 20–31)
Chloride: 102 mmol/L (ref 98–110)
Creat: 0.75 mg/dL (ref 0.50–0.99)
GFR, EST NON AFRICAN AMERICAN: 83 mL/min (ref >=60)
Glucose, Bld: 99 mg/dL (ref 65–99)
Potassium: 4 mmol/L (ref 3.5–5.3)
SODIUM: 139 mmol/L (ref 135–146)

## 2016-12-19 LAB — MAGNESIUM: Magnesium: 1.8 mg/dL (ref 1.5–2.5)

## 2016-12-19 LAB — LIPID PANEL
CHOL/HDL RATIO: 2.4 ratio (ref <5.0)
CHOLESTEROL: 224 mg/dL — AB (ref <200)
HDL: 95 mg/dL (ref >50)
LDL Cholesterol: 111 mg/dL — ABNORMAL HIGH (ref <100)
TRIGLYCERIDES: 89 mg/dL (ref <150)
VLDL: 18 mg/dL (ref <30)

## 2016-12-19 LAB — VITAMIN D 25 HYDROXY (VIT D DEFICIENCY, FRACTURES): Vit D, 25-Hydroxy: 60 ng/mL (ref 30–100)

## 2016-12-19 LAB — MICROALBUMIN / CREATININE URINE RATIO

## 2016-12-19 LAB — TSH: TSH: 0.64 mIU/L

## 2016-12-19 LAB — HEMOGLOBIN A1C
Hgb A1c MFr Bld: 5.5 % (ref <5.7)
MEAN PLASMA GLUCOSE: 111 mg/dL

## 2016-12-19 LAB — INSULIN, RANDOM: Insulin: 5.7 u[IU]/mL (ref 2.0–19.6)

## 2017-01-01 ENCOUNTER — Other Ambulatory Visit: Payer: Self-pay | Admitting: Physician Assistant

## 2017-01-11 ENCOUNTER — Other Ambulatory Visit: Payer: Self-pay | Admitting: Internal Medicine

## 2017-01-11 DIAGNOSIS — I10 Essential (primary) hypertension: Secondary | ICD-10-CM

## 2017-01-11 MED ORDER — NIFEDIPINE ER OSMOTIC RELEASE 60 MG PO TB24: ORAL_TABLET | ORAL | 2 refills | Status: DC

## 2017-01-16 ENCOUNTER — Other Ambulatory Visit: Payer: Self-pay | Admitting: Internal Medicine

## 2017-01-21 ENCOUNTER — Telehealth: Payer: Self-pay | Admitting: *Deleted

## 2017-01-21 MED ORDER — EZETIMIBE 10 MG PO TABS: 10.0000 mg | ORAL_TABLET | Freq: Every day | ORAL | 1 refills | Status: DC

## 2017-01-21 NOTE — Telephone Encounter (Signed)
Patient called and states since she increased her Atorvastatin to daily, she is having headaches and pain in legs.  Per Dr Melford Aase, stop the Atorvastatin and a new RX for Zetia 10 mg sent to her pharmacy.

## 2017-01-29 ENCOUNTER — Other Ambulatory Visit: Payer: Self-pay | Admitting: Internal Medicine

## 2017-03-19 ENCOUNTER — Other Ambulatory Visit: Payer: Self-pay | Admitting: Physician Assistant

## 2017-03-20 ENCOUNTER — Ambulatory Visit (INDEPENDENT_AMBULATORY_CARE_PROVIDER_SITE_OTHER): Payer: PPO | Admitting: Physician Assistant

## 2017-03-20 VITALS — BP 130/82 | HR 67 | Temp 97.6°F | Resp 14 | Ht 66.0 in | Wt 168.2 lb

## 2017-03-20 DIAGNOSIS — R079 Chest pain, unspecified: Secondary | ICD-10-CM

## 2017-03-20 DIAGNOSIS — R1013 Epigastric pain: Secondary | ICD-10-CM

## 2017-03-20 LAB — BASIC METABOLIC PANEL WITH GFR
BUN: 16 mg/dL (ref 7–25)
CO2: 29 mmol/L (ref 20–32)
CREATININE: 0.66 mg/dL (ref 0.50–0.99)
Calcium: 9.8 mg/dL (ref 8.6–10.4)
Chloride: 104 mmol/L (ref 98–110)
GFR, EST AFRICAN AMERICAN: 107 mL/min/{1.73_m2} (ref >=60)
GFR, Est Non African American: 92 mL/min/{1.73_m2} (ref >=60)
Glucose, Bld: 94 mg/dL (ref 65–99)
Potassium: 4 mmol/L (ref 3.5–5.3)
SODIUM: 142 mmol/L (ref 135–146)

## 2017-03-20 LAB — HEPATIC FUNCTION PANEL
AG Ratio: 1.8 (calc) (ref 1.0–2.5)
ALT: 10 U/L (ref 6–29)
AST: 17 U/L (ref 10–35)
Albumin: 4.4 g/dL (ref 3.6–5.1)
Alkaline phosphatase (APISO): 66 U/L (ref 33–130)
BILIRUBIN INDIRECT: 0.4 mg/dL (ref 0.2–1.2)
BILIRUBIN TOTAL: 0.5 mg/dL (ref 0.2–1.2)
Bilirubin, Direct: 0.1 mg/dL (ref 0.0–0.2)
Globulin: 2.5 g/dL (calc) (ref 1.9–3.7)
Total Protein: 6.9 g/dL (ref 6.1–8.1)

## 2017-03-20 LAB — CBC WITH DIFFERENTIAL/PLATELET
BASOS PCT: 0.4 %
Basophils Absolute: 28 cells/uL (ref 0–200)
EOS ABS: 168 {cells}/uL (ref 15–500)
Eosinophils Relative: 2.4 %
HCT: 40.4 % (ref 35.0–45.0)
HEMOGLOBIN: 13.7 g/dL (ref 11.7–15.5)
Lymphs Abs: 2317 cells/uL (ref 850–3900)
MCH: 27.5 pg (ref 27.0–33.0)
MCHC: 33.9 g/dL (ref 32.0–36.0)
MCV: 81.1 fL (ref 80.0–100.0)
MPV: 10.6 fL (ref 7.5–12.5)
Monocytes Relative: 8.4 %
Neutro Abs: 3899 cells/uL (ref 1500–7800)
Neutrophils Relative %: 55.7 %
PLATELETS: 229 10*3/uL (ref 140–400)
RBC: 4.98 10*6/uL (ref 3.80–5.10)
RDW: 13.9 % (ref 11.0–15.0)
TOTAL LYMPHOCYTE: 33.1 %
WBC: 7 10*3/uL (ref 3.8–10.8)
WBCMIX: 588 {cells}/uL (ref 200–950)

## 2017-03-20 MED ORDER — OMEPRAZOLE 20 MG PO CPDR: 20.0000 mg | DELAYED_RELEASE_CAPSULE | Freq: Every day | ORAL | 1 refills | Status: DC

## 2017-03-20 NOTE — Patient Instructions (Signed)
Get on prilosec x 2 weeks in the AM 30 mins before food Stay on mobic Continue bASA  Go to the ER if any chest pain, shortness of breath, nausea, dizziness, severe HA, changes vision/speech   Take omeprazole over the counter for 2 weeks, then go to zantac 150-300 mg at night for 2 weeks, then you can stop.  Avoid alcohol, spicy foods, NSAIDS (aleve, ibuprofen) at this time. See foods below.   Food Choices for Gastroesophageal Reflux Disease When you have gastroesophageal reflux disease (GERD), the foods you eat and your eating habits are very important. Choosing the right foods can help ease the discomfort of GERD. WHAT GENERAL GUIDELINES DO I NEED TO FOLLOW?  Choose fruits, vegetables, whole grains, low-fat dairy products, and low-fat meat, fish, and poultry.  Limit fats such as oils, salad dressings, butter, nuts, and avocado.  Keep a food diary to identify foods that cause symptoms.  Avoid foods that cause reflux. These may be different for different people.  Eat frequent small meals instead of three large meals each day.  Eat your meals slowly, in a relaxed setting.  Limit fried foods.  Cook foods using methods other than frying.  Avoid drinking alcohol.  Avoid drinking large amounts of liquids with your meals.  Avoid bending over or lying down until 2-3 hours after eating. WHAT FOODS ARE NOT RECOMMENDED? The following are some foods and drinks that may worsen your symptoms: Vegetables Tomatoes. Tomato juice. Tomato and spaghetti sauce. Chili peppers. Onion and garlic. Horseradish. Fruits Oranges, grapefruit, and lemon (fruit and juice). Meats High-fat meats, fish, and poultry. This includes hot dogs, ribs, ham, sausage, salami, and bacon. Dairy Whole milk and chocolate milk. Sour cream. Cream. Butter. Ice cream. Cream cheese.  Beverages Coffee and tea, with or without caffeine. Carbonated beverages or energy drinks. Condiments Hot sauce. Barbecue sauce.    Sweets/Desserts Chocolate and cocoa. Donuts. Peppermint and spearmint. Fats and Oils High-fat foods, including [place] fries and potato chips. Other Vinegar. Strong spices, such as black pepper, white pepper, red pepper, cayenne, curry powder, cloves, ginger, and chili powder.   Costochondritis Costochondritis is swelling and irritation (inflammation) of the tissue (cartilage) that connects your ribs to your breastbone (sternum). This causes pain in the front of your chest. Usually, the pain:  Starts gradually.  Is in more than one rib.  This condition usually goes away on its own over time. Follow these instructions at home:  Do not do anything that makes your pain worse.  If directed, put ice on the painful area: ? Put ice in a plastic bag. ? Place a towel between your skin and the bag. ? Leave the ice on for 20 minutes, 2-3 times a day.  If directed, put heat on the affected area as often as told by your doctor. Use the heat source that your doctor tells you to use, such as a moist heat pack or a heating pad. ? Place a towel between your skin and the heat source. ? Leave the heat on for 20-30 minutes. ? Take off the heat if your skin turns bright red. This is very important if you cannot feel pain, heat, or cold. You may have a greater risk of getting burned.  Take over-the-counter and prescription medicines only as told by your doctor.  Return to your normal activities as told by your doctor. Ask your doctor what activities are safe for you.  Keep all follow-up visits as told by your doctor. This is important.  Contact a doctor if:  You have chills or a fever.  Your pain does not go away or it gets worse.  You have a cough that does not go away. Get help right away if:  You are short of breath. This information is not intended to replace advice given to you by your health care provider. Make sure you discuss any questions you have with your health care  provider. Document Released: 12/19/2007 Document Revised: 01/20/2016 Document Reviewed: 10/26/2015 Elsevier Interactive Patient Education  [employer].  Here is some information to help you keep your heart healthy: Move it! - Aim for 30 mins of activity every day. Take it slowly at first. Talk to [place] before starting any new exercise program.   Lose it.  -Body Mass Index (BMI) can indicate if you need to lose weight. A healthy range is 18.5-24.9. For a BMI calculator, go to [employer]  Waist Management -Excess abdominal fat is a risk factor for heart disease, diabetes, asthma, stroke and more. Ideal waist circumference is less than 35" for women and less than 40" for men.   Eat Right -focus on fruits, vegetables, whole grains, and meals you make yourself. Avoid foods with trans fat and high sugar/sodium content.   Snooze or Snore? - Loud snoring can be a sign of sleep apnea, a significant risk factor for high blood pressure, heart attach, stroke, and heart arrhythmias.  Kick the habit -Quit Smoking! Avoid second hand smoke. A single cigarette raises your blood pressure for 20 mins and increases the risk of heart attack and stroke for the next 24 hours.   Are Aspirin and Supplements right for you? -Add ENTERIC COATED low dose 81 mg Aspirin daily OR can do every other day if you have easy bruising to protect your heart and head. As well as to reduce risk of Colon Cancer by 20 %, Skin Cancer by 26 % , Melanoma by 46% and Pancreatic cancer by 60%  Say "No to Stress -There may be little you can do about problems that cause stress. However, techniques such as long walks, meditation, and exercise can help you manage it.   Start Now! - Make changes one at a time and set reasonable goals to increase your likelihood of success.

## 2017-03-20 NOTE — Progress Notes (Signed)
Subjective:    Patient ID: [name], female    DOB: 07/20/1950, 66 y.o.   MRN: [id number]  HPI 66 y.o. smoking WF presents with HTN, preDM, chol presents with chest pain and rash x 1 month.  Has been intermittent, crampy pain, lasted seconds, moving around, mainly left side chest discomfort. She stopped wellbutrin 1 month ago, changed chol medication. Worse with exertion, better with sitting, slight SOB with it but has allergies. She has had cough, sinus issues, and ears. She states since on cholesterol medication she will bruise easily. She has had bad taste in back of mouth.  She is on 81mg  QD and mobic every day.    Wt Readings from Last 3 Encounters:  03/20/17 168 lb 3.2 oz (76.3 kg)  12/18/16 164 lb (74.4 kg)  11/05/16 166 lb 6.4 oz (75.5 kg)   Blood pressure 130/82, pulse 67, temperature 97.6 F (36.4 C), resp. rate 14, height 5\' 6"  (1.676 m), weight 168 lb 3.2 oz (76.3 kg), SpO2 96 %.  Medications Current Outpatient Prescriptions on File Prior to Visit  Medication Sig  . amLODipine (NORVASC) 2.5 MG tablet TAKE 1 TABLET(2.5 MG) BY MOUTH DAILY  . aspirin EC 325 MG tablet Take 1 tablet (325 mg total) by mouth 2 (two) times daily.  . bisoprolol-hydrochlorothiazide (ZIAC) 10-6.25 MG tablet TAKE 1 TABLET BY MOUTH EVERY MORNING FOR BLOOD PRESSURE  . Cholecalciferol (VITAMIN D3) 5000 UNITS TABS Take 5,000 Units by mouth daily.  . DUREZOL 0.05 % EMUL   . enalapril (VASOTEC) 20 MG tablet TAKE 1 TABLET BY MOUTH EVERY DAY  . ezetimibe (ZETIA) 10 MG tablet Take 1 tablet (10 mg total) by mouth daily.  . fluticasone (FLONASE) 50 MCG/ACT nasal spray 1 Spray each nostril daily  . meloxicam (MOBIC) 15 MG tablet TAKE 1 TABLET BY MOUTH DAILY  . methocarbamol (ROBAXIN) 500 MG tablet Take 1 tablet (500 mg total) by mouth 2 (two) times daily with a meal.  . montelukast (SINGULAIR) 10 MG tablet TAKE 1 TABLET(10 MG) BY MOUTH DAILY  . Multiple Vitamins-Minerals (MULTI COMPLETE PO) Take 1  capsule by mouth daily.  Marland Kitchen NIFEdipine (PROCARDIA XL) 60 MG 24 hr tablet Take 1 tablet every night for BP  . Omega-3 Fatty Acids (FISH OIL) 1000 MG CAPS Take 1 capsule by mouth daily.  Marland Kitchen PROLENSA 0.07 % SOLN   . clonazePAM (KLONOPIN) 0.5 MG tablet TAKE 1/2-1 TABLET BY MOUTH AT BEDTIME FOR SLEEP   No current facility-administered medications on file prior to visit.     Problem list She has Essential hypertension; Mixed hyperlipidemia; Vitamin D deficiency; Prediabetes; and Medication management on her problem list.    Review of Systems  Constitutional: Negative.  Negative for chills, fatigue and fever.  HENT: Positive for congestion. Negative for sore throat and trouble swallowing.   Eyes: Negative.   Respiratory: Positive for cough and shortness of breath. Negative for choking and wheezing.   Cardiovascular: Positive for chest pain. Negative for palpitations and leg swelling.  Gastrointestinal: Negative for abdominal distention, abdominal pain, anal bleeding, blood in stool, constipation, diarrhea, nausea, rectal pain and vomiting.       GERD  Genitourinary: Negative.   Musculoskeletal: Negative.   Skin: Negative.   Neurological: Negative.   Hematological: Negative.   Psychiatric/Behavioral: Negative.        Objective:   Physical Exam  Constitutional: She is oriented to person, place, and time. She appears well-developed and well-nourished. No distress.  HENT:  Head:  Normocephalic.  Mouth/Throat: Oropharynx is clear and moist. No oropharyngeal exudate.  Eyes: Conjunctivae are normal. No scleral icterus.  Neck: Normal range of motion. Neck supple. No JVD present. No thyromegaly present.  Cardiovascular: Normal rate, regular rhythm, normal heart sounds and intact distal pulses.  Exam reveals no gallop and no friction rub.   No murmur heard. Pulmonary/Chest: Effort normal and breath sounds normal. No respiratory distress. She has no wheezes. She has no rales. She exhibits  tenderness (left side).  Abdominal: Soft. Bowel sounds are normal. She exhibits no distension and no mass. There is tenderness (RUQ and epigastric, neg murphys). There is no rebound and no guarding.  Musculoskeletal: Normal range of motion. She exhibits no edema.  Lymphadenopathy:    She has no cervical adenopathy.  Neurological: She is alert and oriented to person, place, and time. No cranial nerve deficit.  Skin: Skin is warm and dry. No rash noted. She is not diaphoretic.  Psychiatric: She has a normal mood and affect. Her behavior is normal. Judgment and thought content normal.  Nursing note and vitals reviewed.      Assessment & Plan:  Epigastric pain/Chest pain DDX GERD, ulcer ,GB, CAD, costochondritis  With HTN, chol, smoking history and chest discomfort with exertion will refer to cardiology to rule out CAD, if any symptoms worse in the mean time patient knows to go to ER check labs, bland diet, small portions, increase H20, PPI x 2 weeks if pain continues will return for abdominal U/S  Patient advised to go to the ER if the symptoms increase or worsen.    Sinus bradycardia Cut ziac in half and increase norvasc to 2.5mg  BID Has close follow up   Future Appointments Date Time Provider [place]  04/10/2017 10:30 AM Vicie Mutters, PA-C [facility] None  07/10/2017 10:30 AM Unk Pinto, MD [facility] None  01/08/2018 9:00 AM Unk Pinto, MD [facility] None

## 2017-03-21 LAB — TROPONIN I

## 2017-03-26 ENCOUNTER — Other Ambulatory Visit: Payer: Self-pay | Admitting: Internal Medicine

## 2017-03-26 DIAGNOSIS — I10 Essential (primary) hypertension: Secondary | ICD-10-CM

## 2017-03-27 NOTE — Progress Notes (Signed)
Pt aware of lab results & voiced understanding of those results.

## 2017-03-29 ENCOUNTER — Encounter: Payer: Self-pay | Admitting: Cardiology

## 2017-03-29 ENCOUNTER — Ambulatory Visit (INDEPENDENT_AMBULATORY_CARE_PROVIDER_SITE_OTHER): Payer: PPO | Admitting: Cardiology

## 2017-03-29 VITALS — BP 140/66 | HR 67 | Ht 66.0 in | Wt 170.0 lb

## 2017-03-29 DIAGNOSIS — Z720 Tobacco use: Secondary | ICD-10-CM | POA: Diagnosis not present

## 2017-03-29 DIAGNOSIS — R072 Precordial pain: Secondary | ICD-10-CM | POA: Diagnosis not present

## 2017-03-29 DIAGNOSIS — E7800 Pure hypercholesterolemia, unspecified: Secondary | ICD-10-CM

## 2017-03-29 DIAGNOSIS — I10 Essential (primary) hypertension: Secondary | ICD-10-CM | POA: Diagnosis not present

## 2017-03-29 NOTE — Progress Notes (Signed)
Referring-[name] Reason for referral-chest pain  HPI: 66 year old female for evaluation of chest pain at request of Vicie Mutters PA-C. Carotid Dopplers July 2013 showed less than 50% bilateral stenosis. Laboratories September 2018 showed hemoglobin 13.7, normal troponin, potassium 4.0, BUN 16 and creatinine 0.66. Liver functions normal. Over the past month she has had occasional chest pain. It is above the left breast radiating to the lower part of her breast. It is described as a gas sensation. No associated symptoms. Not exertional, pleuritic, positional or related to food. Resolves in 30 minutes spontaneously. She has mild dyspnea on exertion but no orthopnea, PND or pedal edema. No syncope. Because of the above we were asked to evaluate.   Current Outpatient Prescriptions  Medication Sig Dispense Refill  . amLODipine (NORVASC) 2.5 MG tablet TAKE 1 TABLET(2.5 MG) BY MOUTH DAILY (Patient taking differently: TAKE 1/2 TABLET(2.5 MG) BY MOUTH TWICE DAILY.) 90 tablet 1  . aspirin 81 MG tablet Take 81 mg by mouth daily.    . bisoprolol-hydrochlorothiazide (ZIAC) 10-6.25 MG tablet TAKE 1 TABLET BY MOUTH EVERY MORNING FOR BLOOD PRESSURE (Patient taking differently: TAKE 1/2 TABLET BY MOUTH DAILY FOR BLOOD PRESSURE.) 90 tablet 0  . Cholecalciferol (VITAMIN D3) 5000 UNITS TABS Take 5,000 Units by mouth daily.    . enalapril (VASOTEC) 20 MG tablet TAKE 1 TABLET BY MOUTH EVERY DAY 90 tablet 1  . ezetimibe (ZETIA) 10 MG tablet Take 1 tablet (10 mg total) by mouth daily. 90 tablet 1  . fluticasone (FLONASE) 50 MCG/ACT nasal spray 1 Spray each nostril daily 48 g 1  . methocarbamol (ROBAXIN) 500 MG tablet Take 1 tablet (500 mg total) by mouth 2 (two) times daily with a meal. 60 tablet 0  . montelukast (SINGULAIR) 10 MG tablet TAKE 1 TABLET(10 MG) BY MOUTH DAILY 90 tablet 1  . Multiple Vitamins-Minerals (MULTI COMPLETE PO) Take 1 capsule by mouth daily.    Marland Kitchen NIFEdipine (PROCARDIA  XL/ADALAT-CC) 60 MG 24 hr tablet TAKE 1 TABLET BY MOUTH EVERY NIGHT FOR BLOOD PRESSURE 90 tablet 2  . Omega-3 Fatty Acids (FISH OIL) 1000 MG CAPS Take 1 capsule by mouth daily.    Marland Kitchen omeprazole (PRILOSEC) 20 MG capsule Take 1 capsule (20 mg total) by mouth daily. 30 capsule 1  . clonazePAM (KLONOPIN) 0.5 MG tablet TAKE 1/2-1 TABLET BY MOUTH AT BEDTIME FOR SLEEP 90 tablet 1   No current facility-administered medications for this visit.     Allergies  Allergen Reactions  . No Known Allergies      Past Medical History:  Diagnosis Date  . Allergy    enviromental  . Anxiety   . Arthritis    osteoarthritis  . COPD (chronic obstructive pulmonary disease) (HCC)    mild  . Diabetes mellitus without complication ([place])    controlled by diet  . Environmental allergies   . Hepatitis    hepatitis A  . Hyperlipidemia   . Hypertension   . PONV (postoperative nausea and vomiting)    second surgery no problems last surgery  . Urgency of urination    Takes oxybutynin; under control at this time    Past Surgical History:  Procedure Laterality Date  . APPENDECTOMY     removed as child  . BACK SURGERY     lumbar fusion  . COLONOSCOPY    . DILATION AND CURETTAGE OF UTERUS    . LUMBAR FUSION  02/06/2012   L 4  L5  . PARTIAL HYSTERECTOMY    .  TOTAL HIP ARTHROPLASTY  06/02/2012   Procedure: TOTAL HIP ARTHROPLASTY;  Surgeon: Kerin Salen, MD;  Location: [facility];  Service: Orthopedics;  Laterality: Right;  . TOTAL HIP ARTHROPLASTY Left 01/25/2016  . TOTAL HIP ARTHROPLASTY Left 01/25/2016   Procedure: LEFT TOTAL HIP ARTHROPLASTY;  Surgeon: Frederik Pear, MD;  Location: [facility];  Service: Orthopedics;  Laterality: Left;  . TOTAL KNEE ARTHROPLASTY Right 12/15/2012   Procedure: TOTAL KNEE ARTHROPLASTY;  Surgeon: Kerin Salen, MD;  Location: [facility];  Service: Orthopedics;  Laterality: Right;  . TOTAL KNEE ARTHROPLASTY Left 06/18/2016   Procedure: TOTAL KNEE ARTHROPLASTY;  Surgeon: Frederik Pear, MD;   Location: [facility];  Service: Orthopedics;  Laterality: Left;    Social History   Social History  . Marital status: Married    Spouse name: N/A  . Number of children: 2  . Years of education: N/A   Occupational History  . Not on file.   Social History Main Topics  . Smoking status: Current Every Day Smoker    Packs/day: 0.25    Years: 30.00    Types: Cigarettes  . Smokeless tobacco: Never Used  . Alcohol use 8.4 oz/week    14 Glasses of wine per week     Comment: either a glass of wine a night or 2 beers-advised none within 48 hrs of surgery  . Drug use: Yes    Types: Marijuana     Comment: last 06/12/16- advised none within 48 hrs of surgery  . Sexual activity: Yes    Birth control/ protection: Surgical   Other Topics Concern  . Not on file   Social History Narrative  . No narrative on file    Family History  Problem Relation Age of Onset  . Diabetes Mother   . Colon cancer Mother   . Hypertension Father   . CAD Father   . Colon cancer Sister   . Lung cancer Brother   . Hypertension Brother   . Hypertension Brother   . Esophageal cancer Neg Hx   . Rectal cancer Neg Hx   . Stomach cancer Neg Hx     ROS: no fevers or chills, productive cough, hemoptysis, dysphasia, odynophagia, melena, hematochezia, dysuria, hematuria, rash, seizure activity, orthopnea, PND, pedal edema, claudication. Remaining systems are negative.  Physical Exam:   Blood pressure 140/66, pulse 67, height 5\' 6"  (1.676 m), weight 77.1 kg (170 lb).  General:  Well developed/well nourished in NAD Skin warm/dry Patient not depressed No peripheral clubbing Back-normal HEENT-normal/normal eyelids Neck supple/normal carotid upstroke bilaterally; no bruits; no JVD; no thyromegaly chest - CTA/ normal expansion CV - RRR/normal S1 and S2; no murmurs, rubs or gallops;  PMI nondisplaced Abdomen -NT/ND, no HSM, no mass, + bowel sounds, no bruit 2+ femoral pulses, no bruits Ext-no edema, chords, 2+  DP Neuro-grossly nonfocal  ECG - Normal sinus rhythm at a rate of 67. No ST changes. personally reviewed  A/P  1 Chest pain-symptoms are somewhat atypical. Plan exercise treadmill for risk stratification. Echocardiogram to assess LV function and wall motion.   2 hypertension-blood pressure is controlled. Continue present medications.  3 tobacco abuse-patient counseled on discontinuing.   4 Hyperlipidemia-continue present medications. Management per primary care.  Kirk Ruths, MD

## 2017-03-29 NOTE — Patient Instructions (Signed)
Medication Instructions:   NO CHANGE  Testing/Procedures:  Your physician has requested that you have an exercise tolerance test. For further information please visit [email or web address]. Please also follow instruction sheet, as given.   Your physician has requested that you have an echocardiogram. Echocardiography is a painless test that uses sound waves to create images of your heart. It provides your doctor with information about the size and shape of your heart and how well your heart's chambers and valves are working. This procedure takes approximately one hour. There are no restrictions for this procedure.    Follow-Up:  Your physician recommends that you schedule a follow-up appointment in: AS NEEDED

## 2017-04-03 ENCOUNTER — Other Ambulatory Visit: Payer: Self-pay

## 2017-04-03 ENCOUNTER — Ambulatory Visit (HOSPITAL_COMMUNITY): Payer: PPO | Attending: Cardiology

## 2017-04-03 DIAGNOSIS — I10 Essential (primary) hypertension: Secondary | ICD-10-CM | POA: Diagnosis not present

## 2017-04-03 DIAGNOSIS — Z720 Tobacco use: Secondary | ICD-10-CM | POA: Diagnosis not present

## 2017-04-03 DIAGNOSIS — E785 Hyperlipidemia, unspecified: Secondary | ICD-10-CM | POA: Insufficient documentation

## 2017-04-03 DIAGNOSIS — J449 Chronic obstructive pulmonary disease, unspecified: Secondary | ICD-10-CM | POA: Diagnosis not present

## 2017-04-03 DIAGNOSIS — R072 Precordial pain: Secondary | ICD-10-CM | POA: Insufficient documentation

## 2017-04-09 NOTE — Progress Notes (Signed)
FOLLOW UP  Assessment and Plan:    Hypertension  Continue medication: Norvasc 2.5 mg, bisoprolol-hctz 10-6.25, enalapril 20 mg daily  Monitor blood pressure at home; call if greater than 130/80  Continue DASH diet.    Reminder to go to the ER if any CP, SOB, nausea, dizziness, severe HA, changes vision/speech, left arm numbness and tingling and jaw pain.  Cholesterol  Considering change in dosage/medication pending labs; currently on ezetimibe 10 mg daily  Continue diet and exercise.   Check lipid panel.    Prediabetes  Currently well controlled by lifestyle change.  Continue diet and exercise.   Perform daily foot/skin check, notify office of any concerning changes.   Check A1C  Vitamin D Def/ osteoporosis prevention  Continue medications:  5000 IU daily  Check Vit D level  GERD  Continue omeprazole 20 mg daily; adding ranitidine 300 mg, may take 1/2 BID or 1 tab in PM depending on symptom control  Discussed diet/lifestyle change  The patient is to call the office back for a possible visit or possible GI referral for EGD if symptoms are not improving  Continue diet and meds as discussed. Further disposition pending results of labs. Discussed med's effects and SE's.   Over 30 minutes of exam, counseling, chart review, and critical decision making was performed.   Future Appointments Date Time Provider [place]  04/11/2017 3:30 PM [facility]  07/10/2017 10:30 AM Unk Pinto, MD [facility] None  01/08/2018 9:00 AM Unk Pinto, MD [facility] None    ----------------------------------------------------------------------------------------------------------------------  HPI 66 y.o. female  presents for 3 month follow up on hypertension, cholesterol, diabetes and vitamin D deficiency.   Patient has ongoing history of acid reflux and has been treated for over 10 years. Has been on ranitidine, prilosec. The patient reports  increased symptoms of acid reflux (esophageal burning, acid taste in mouth, hoarseness, frequent coughing). Currently on omeprazole 20 mg daily; will start high dose ranitidine today, and instructed patient to call in 4-6 weeks if symptoms not improved. Patient reports she had a barium swallow study several years ago, negative, has not seen GI.   Patient had EKG last week which was negative, and was sent for an ECHO with EF 55-60%, unremarkable exam, has stress test scheduled tomorrow.    Her blood pressure has been controlled at home, today their BP is BP: 126/82  She does workout. Started riding a stationary bike daily. She denies chest pain, shortness of breath, dizziness.   She is on cholesterol medication and denies myalgias. Her cholesterol is not at goal. The cholesterol last visit was:   Lab Results  Component Value Date   CHOL 224 (H) 12/18/2016   HDL 95 12/18/2016   LDLCALC 111 (H) 12/18/2016   TRIG 89 12/18/2016   CHOLHDL 2.4 12/18/2016    She has been working on diet and exercise for prediabetes, and denies hyperglycemia, hypoglycemia  and nausea. Reports intermittent tingling to toes. Last A1C in the office was:  Lab Results  Component Value Date   HGBA1C 5.5 12/18/2016   Patient is on Vitamin D supplement though below goal.   Lab Results  Component Value Date   VD25OH 60 12/18/2016         Current Medications:  Current Outpatient Prescriptions on File Prior to Visit  Medication Sig  . amLODipine (NORVASC) 2.5 MG tablet TAKE 1 TABLET(2.5 MG) BY MOUTH DAILY (Patient taking differently: TAKE 1/2 TABLET(2.5 MG) BY MOUTH TWICE DAILY.)  .  aspirin 81 MG tablet Take 81 mg by mouth daily.  . bisoprolol-hydrochlorothiazide (ZIAC) 10-6.25 MG tablet TAKE 1 TABLET BY MOUTH EVERY MORNING FOR BLOOD PRESSURE (Patient taking differently: TAKE 1/2 TABLET BY MOUTH DAILY FOR BLOOD PRESSURE.)  . Cholecalciferol (VITAMIN D3) 5000 UNITS TABS Take 5,000 Units by mouth daily.  . enalapril  (VASOTEC) 20 MG tablet TAKE 1 TABLET BY MOUTH EVERY DAY  . ezetimibe (ZETIA) 10 MG tablet Take 1 tablet (10 mg total) by mouth daily.  . fluticasone (FLONASE) 50 MCG/ACT nasal spray 1 Spray each nostril daily  . methocarbamol (ROBAXIN) 500 MG tablet Take 1 tablet (500 mg total) by mouth 2 (two) times daily with a meal.  . montelukast (SINGULAIR) 10 MG tablet TAKE 1 TABLET(10 MG) BY MOUTH DAILY  . Multiple Vitamins-Minerals (MULTI COMPLETE PO) Take 1 capsule by mouth daily.  Marland Kitchen NIFEdipine (PROCARDIA XL/ADALAT-CC) 60 MG 24 hr tablet TAKE 1 TABLET BY MOUTH EVERY NIGHT FOR BLOOD PRESSURE  . Omega-3 Fatty Acids (FISH OIL) 1000 MG CAPS Take 1 capsule by mouth daily.  Marland Kitchen omeprazole (PRILOSEC) 20 MG capsule Take 1 capsule (20 mg total) by mouth daily.  . clonazePAM (KLONOPIN) 0.5 MG tablet TAKE 1/2-1 TABLET BY MOUTH AT BEDTIME FOR SLEEP   No current facility-administered medications on file prior to visit.      Allergies:  Allergies  Allergen Reactions  . No Known Allergies      Medical History:  Past Medical History:  Diagnosis Date  . Allergy    enviromental  . Anxiety   . Arthritis    osteoarthritis  . COPD (chronic obstructive pulmonary disease) (HCC)    mild  . Diabetes mellitus without complication ([place])    controlled by diet  . Environmental allergies   . Hepatitis    hepatitis A  . Hyperlipidemia   . Hypertension   . PONV (postoperative nausea and vomiting)    second surgery no problems last surgery  . Urgency of urination    Takes oxybutynin; under control at this time   Family history- Reviewed and unchanged Social history- Reviewed and unchanged   Review of Systems:  Review of Systems  Constitutional: Negative.  Negative for chills, fever, malaise/fatigue and weight loss.  HENT: Negative for congestion, hearing loss and sore throat.   Eyes: Negative for blurred vision.  Respiratory: Positive for cough (Dry cough/ "clearing throat"). Negative for hemoptysis, sputum  production, shortness of breath and wheezing.   Cardiovascular: Negative for chest pain, palpitations, orthopnea, claudication and leg swelling.  Gastrointestinal: Positive for heartburn. Negative for abdominal pain, blood in stool, constipation, diarrhea, melena, nausea and vomiting.  Genitourinary: Negative.   Musculoskeletal: Negative for myalgias.  Skin: Negative.  Negative for rash.  Neurological: Negative for dizziness, sensory change and headaches.  Endo/Heme/Allergies: Bruises/bleeds easily.  Psychiatric/Behavioral: Negative.  The patient does not have insomnia.       Physical Exam: BP 126/82   Pulse 60   Temp (!) 97.3 F (36.3 C)   Resp 16   Ht 5\' 6"  (1.676 m)   Wt 169 lb 12.8 oz (77 kg)   BMI 27.41 kg/m  Wt Readings from Last 3 Encounters:  04/10/17 169 lb 12.8 oz (77 kg)  03/29/17 170 lb (77.1 kg)  03/20/17 168 lb 3.2 oz (76.3 kg)   General Appearance: Well nourished, in no apparent distress. Eyes: PERRLA, EOMs, conjunctiva no swelling or erythema Sinuses: No Frontal/maxillary tenderness ENT/Mouth: Ext aud canals clear, TMs without erythema, bulging. No erythema, swelling,  or exudate on post pharynx.  Tonsils not swollen or erythematous. Hearing normal.  Neck: Supple, thyroid normal.  Respiratory: Respiratory effort normal, BS equal bilaterally without rales, rhonchi, wheezing or stridor.  Cardio: RRR with no MRGs. Brisk peripheral pulses without edema.  Abdomen: Soft, + BS.  Mild epigastric tenderness, no guarding, rebound, hernias, masses. Lymphatics: Non tender without lymphadenopathy.  Musculoskeletal: Full ROM, 5/5 strength, Normal gait Skin: Warm, dry without rashes, lesions, ecchymosis.  Neuro: Cranial nerves intact. No cerebellar symptoms.  Psych: Awake and oriented X 3, normal affect, Insight and Judgment appropriate.    Sherri Ribas, NP 10:52 AM [facility]

## 2017-04-10 ENCOUNTER — Encounter: Payer: Self-pay | Admitting: Adult Health

## 2017-04-10 ENCOUNTER — Other Ambulatory Visit: Payer: Self-pay | Admitting: Adult Health

## 2017-04-10 ENCOUNTER — Ambulatory Visit (INDEPENDENT_AMBULATORY_CARE_PROVIDER_SITE_OTHER): Payer: PPO | Admitting: Adult Health

## 2017-04-10 ENCOUNTER — Telehealth (HOSPITAL_COMMUNITY): Payer: Self-pay

## 2017-04-10 VITALS — BP 126/82 | HR 60 | Temp 97.3°F | Resp 16 | Ht 66.0 in | Wt 169.8 lb

## 2017-04-10 DIAGNOSIS — Z23 Encounter for immunization: Secondary | ICD-10-CM | POA: Diagnosis not present

## 2017-04-10 DIAGNOSIS — E559 Vitamin D deficiency, unspecified: Secondary | ICD-10-CM

## 2017-04-10 DIAGNOSIS — R7303 Prediabetes: Secondary | ICD-10-CM | POA: Diagnosis not present

## 2017-04-10 DIAGNOSIS — E782 Mixed hyperlipidemia: Secondary | ICD-10-CM

## 2017-04-10 DIAGNOSIS — Z79899 Other long term (current) drug therapy: Secondary | ICD-10-CM

## 2017-04-10 DIAGNOSIS — K219 Gastro-esophageal reflux disease without esophagitis: Secondary | ICD-10-CM

## 2017-04-10 DIAGNOSIS — I10 Essential (primary) hypertension: Secondary | ICD-10-CM

## 2017-04-10 MED ORDER — RANITIDINE HCL 300 MG PO TABS
ORAL_TABLET | ORAL | 1 refills | Status: DC
Start: 2017-04-10 — End: 2017-04-10

## 2017-04-10 NOTE — Patient Instructions (Addendum)
Start zantac- may take 300 at night or split tab and take 1/2 in AM/PM to see which helps symptoms better.    Food Choices for Gastroesophageal Reflux Disease, Adult When you have gastroesophageal reflux disease (GERD), the foods you eat and your eating habits are very important. Choosing the right foods can help ease the discomfort of GERD. Consider working with a diet and nutrition specialist (dietitian) to help you make healthy food choices. What general guidelines should I follow? Eating plan  Choose healthy foods low in fat, such as fruits, vegetables, whole grains, low-fat dairy products, and lean meat, fish, and poultry.  Eat frequent, small meals instead of three large meals each day. Eat your meals slowly, in a relaxed setting. Avoid bending over or lying down until 2-3 hours after eating.  Limit high-fat foods such as fatty meats or fried foods.  Limit your intake of oils, butter, and shortening to less than 8 teaspoons each day.  Avoid the following: ? Foods that cause symptoms. These may be different for different people. Keep a food diary to keep track of foods that cause symptoms. ? Alcohol. ? Drinking large amounts of liquid with meals. ? Eating meals during the 2-3 hours before bed.  Cook foods using methods other than frying. This may include baking, grilling, or broiling. Lifestyle   Maintain a healthy weight. Ask your health care provider what weight is healthy for you. If you need to lose weight, work with your health care provider to do so safely.  Exercise for at least 30 minutes on 5 or more days each week, or as told by your health care provider.  Avoid wearing clothes that fit tightly around your waist and chest.  Do not use any products that contain nicotine or tobacco, such as cigarettes and e-cigarettes. If you need help quitting, ask your health care provider.  Sleep with the head of your bed raised. Use a wedge under the mattress or blocks under the bed  frame to raise the head of the bed. What foods are not recommended? The items listed may not be a complete list. Talk with your dietitian about what dietary choices are best for you. Grains Pastries or quick breads with added fat. [place] toast. Vegetables Deep fried vegetables. [place] fries. Any vegetables prepared with added fat. Any vegetables that cause symptoms. For some people this may include tomatoes and tomato products, chili peppers, onions and garlic, and horseradish. Fruits Any fruits prepared with added fat. Any fruits that cause symptoms. For some people this may include citrus fruits, such as oranges, grapefruit, pineapple, and lemons. Meats and other protein foods High-fat meats, such as fatty beef or pork, hot dogs, ribs, ham, sausage, salami and bacon. Fried meat or protein, including fried fish and fried chicken. Nuts and nut butters. Dairy Whole milk and chocolate milk. Sour cream. Cream. Ice cream. Cream cheese. Milk shakes. Beverages Coffee and tea, with or without caffeine. Carbonated beverages. Sodas. Energy drinks. Fruit juice made with acidic fruits (such as orange or grapefruit). Tomato juice. Alcoholic drinks. Fats and oils Butter. Margarine. Shortening. Ghee. Sweets and desserts Chocolate and cocoa. Donuts. Seasoning and other foods Pepper. Peppermint and spearmint. Any condiments, herbs, or seasonings that cause symptoms. For some people, this may include curry, hot sauce, or vinegar-based salad dressings. Summary  When you have gastroesophageal reflux disease (GERD), food and lifestyle choices are very important to help ease the discomfort of GERD.  Eat frequent, small meals instead of three large meals  each day. Eat your meals slowly, in a relaxed setting. Avoid bending over or lying down until 2-3 hours after eating.  Limit high-fat foods such as fatty meat or fried foods. This information is not intended to replace advice given to you by your health care  provider. Make sure you discuss any questions you have with your health care provider. Document Released: 07/02/2005 Document Revised: 07/03/2016 Document Reviewed: 07/03/2016 Elsevier Interactive Patient Education  2017 [employer].

## 2017-04-10 NOTE — Telephone Encounter (Signed)
Encounter complete. 

## 2017-04-11 ENCOUNTER — Ambulatory Visit (HOSPITAL_COMMUNITY)
Admission: RE | Admit: 2017-04-11 | Discharge: 2017-04-11 | Disposition: A | Discharge status: Home / Self Care | Payer: PPO | Source: Ambulatory Visit | Attending: Cardiology | Admitting: Cardiology

## 2017-04-11 DIAGNOSIS — R072 Precordial pain: Secondary | ICD-10-CM | POA: Diagnosis not present

## 2017-04-11 DIAGNOSIS — R079 Chest pain, unspecified: Secondary | ICD-10-CM

## 2017-04-11 DIAGNOSIS — R9439 Abnormal result of other cardiovascular function study: Secondary | ICD-10-CM | POA: Diagnosis not present

## 2017-04-11 DIAGNOSIS — I10 Essential (primary) hypertension: Secondary | ICD-10-CM

## 2017-04-11 LAB — CBC WITH DIFFERENTIAL/PLATELET
BASOS ABS: 39 {cells}/uL (ref 0–200)
Basophils Relative: 0.6 %
Eosinophils Absolute: 189 cells/uL (ref 15–500)
Eosinophils Relative: 2.9 %
HCT: 40.2 % (ref 35.0–45.0)
HEMOGLOBIN: 13.4 g/dL (ref 11.7–15.5)
LYMPHS ABS: 2243 {cells}/uL (ref 850–3900)
MCH: 26.8 pg — ABNORMAL LOW (ref 27.0–33.0)
MCHC: 33.3 g/dL (ref 32.0–36.0)
MCV: 80.4 fL (ref 80.0–100.0)
MPV: 10.7 fL (ref 7.5–12.5)
Monocytes Relative: 6 %
NEUTROS PCT: 56 %
Neutro Abs: 3640 cells/uL (ref 1500–7800)
Platelets: 244 10*3/uL (ref 140–400)
RBC: 5 10*6/uL (ref 3.80–5.10)
RDW: 13.7 % (ref 11.0–15.0)
Total Lymphocyte: 34.5 %
WBC: 6.5 10*3/uL (ref 3.8–10.8)
WBCMIX: 390 {cells}/uL (ref 200–950)

## 2017-04-11 LAB — HEPATIC FUNCTION PANEL
AG Ratio: 1.7 (calc) (ref 1.0–2.5)
ALBUMIN MSPROF: 4.3 g/dL (ref 3.6–5.1)
ALKALINE PHOSPHATASE (APISO): 70 U/L (ref 33–130)
ALT: 11 U/L (ref 6–29)
AST: 18 U/L (ref 10–35)
BILIRUBIN DIRECT: 0.1 mg/dL (ref 0.0–0.2)
BILIRUBIN INDIRECT: 0.4 mg/dL (ref 0.2–1.2)
BILIRUBIN TOTAL: 0.5 mg/dL (ref 0.2–1.2)
Globulin: 2.6 g/dL (calc) (ref 1.9–3.7)
Total Protein: 6.9 g/dL (ref 6.1–8.1)

## 2017-04-11 LAB — BASIC METABOLIC PANEL WITH GFR
BUN: 13 mg/dL (ref 7–25)
CHLORIDE: 101 mmol/L (ref 98–110)
CO2: 28 mmol/L (ref 20–32)
Calcium: 9.7 mg/dL (ref 8.6–10.4)
Creat: 0.8 mg/dL (ref 0.50–0.99)
GFR, EST AFRICAN AMERICAN: 89 mL/min/{1.73_m2} (ref >=60)
GFR, Est Non African American: 77 mL/min/{1.73_m2} (ref >=60)
Glucose, Bld: 93 mg/dL (ref 65–99)
Potassium: 4.2 mmol/L (ref 3.5–5.3)
SODIUM: 138 mmol/L (ref 135–146)

## 2017-04-11 LAB — LIPID PANEL
Cholesterol: 249 mg/dL — ABNORMAL HIGH (ref <200)
HDL: 88 mg/dL (ref >50)
LDL CHOLESTEROL (CALC): 137 mg/dL — AB
Non-HDL Cholesterol (Calc): 161 mg/dL (calc) — ABNORMAL HIGH (ref <130)
Total CHOL/HDL Ratio: 2.8 (calc) (ref <5.0)
Triglycerides: 118 mg/dL (ref <150)

## 2017-04-11 LAB — HEMOGLOBIN A1C
EAG (MMOL/L): 6 (calc)
Hgb A1c MFr Bld: 5.4 % of total Hgb (ref <5.7)
MEAN PLASMA GLUCOSE: 108 (calc)

## 2017-04-11 LAB — MAGNESIUM: MAGNESIUM: 1.8 mg/dL (ref 1.5–2.5)

## 2017-04-11 LAB — VITAMIN D 25 HYDROXY (VIT D DEFICIENCY, FRACTURES): Vit D, 25-Hydroxy: 59 ng/mL (ref 30–100)

## 2017-04-12 ENCOUNTER — Other Ambulatory Visit: Payer: Self-pay | Admitting: Adult Health

## 2017-04-12 ENCOUNTER — Other Ambulatory Visit: Payer: Self-pay | Admitting: *Deleted

## 2017-04-12 DIAGNOSIS — R079 Chest pain, unspecified: Secondary | ICD-10-CM

## 2017-04-12 DIAGNOSIS — E782 Mixed hyperlipidemia: Secondary | ICD-10-CM

## 2017-04-12 DIAGNOSIS — I10 Essential (primary) hypertension: Secondary | ICD-10-CM

## 2017-04-12 LAB — EXERCISE TOLERANCE TEST
CHL CUP RESTING HR STRESS: 77 {beats}/min
CHL CUP STRESS STAGE 1 GRADE: 0 %
CHL CUP STRESS STAGE 1 HR: 76 {beats}/min
CHL CUP STRESS STAGE 1 SPEED: 0 mph
CHL CUP STRESS STAGE 2 GRADE: 0 %
CHL CUP STRESS STAGE 2 HR: 75 {beats}/min
CHL CUP STRESS STAGE 2 SPEED: 1 mph
CHL CUP STRESS STAGE 3 GRADE: 0 %
CHL CUP STRESS STAGE 4 SPEED: 1.7 mph
CHL CUP STRESS STAGE 5 DBP: 111 mmHg
CHL CUP STRESS STAGE 5 GRADE: 12 %
CHL CUP STRESS STAGE 5 HR: 146 {beats}/min
CHL CUP STRESS STAGE 5 SBP: 205 mmHg
CHL CUP STRESS STAGE 6 GRADE: 12 %
CHL CUP STRESS STAGE 6 HR: 146 {beats}/min
CHL CUP STRESS STAGE 7 DBP: 74 mmHg
CHL CUP STRESS STAGE 7 SBP: 216 mmHg
CHL CUP STRESS STAGE 7 SPEED: 0 mph
CHL CUP STRESS STAGE 8 DBP: 70 mmHg
CHL CUP STRESS STAGE 8 SPEED: 0 mph
CHL RATE OF PERCEIVED EXERTION: 18
CSEPEDS: 0 s
CSEPHR: 94 %
CSEPPHR: 146 {beats}/min
Estimated workload: 7 METS
Exercise duration (min): 6 min
MPHR: 154 {beats}/min
Percent of predicted max HR: 94 %
Stage 1 DBP: 81 mmHg
Stage 1 SBP: 166 mmHg
Stage 3 HR: 76 {beats}/min
Stage 3 Speed: 1 mph
Stage 4 DBP: 94 mmHg
Stage 4 Grade: 10 %
Stage 4 HR: 121 {beats}/min
Stage 4 SBP: 218 mmHg
Stage 5 Speed: 2.5 mph
Stage 6 Speed: 2.5 mph
Stage 7 Grade: 0 %
Stage 7 HR: 118 {beats}/min
Stage 8 Grade: 0 %
Stage 8 HR: 79 {beats}/min
Stage 8 SBP: 124 mmHg

## 2017-04-12 MED ORDER — ATORVASTATIN CALCIUM 10 MG PO TABS: 10.0000 mg | ORAL_TABLET | Freq: Every evening | ORAL | 2 refills | Status: DC

## 2017-04-12 NOTE — Progress Notes (Signed)
Pt aware of lab results & voiced understanding of those results. Pt was made aware of the med changes as well

## 2017-04-14 ENCOUNTER — Other Ambulatory Visit: Payer: Self-pay | Admitting: Physician Assistant

## 2017-04-14 DIAGNOSIS — R1013 Epigastric pain: Secondary | ICD-10-CM

## 2017-04-14 DIAGNOSIS — R079 Chest pain, unspecified: Secondary | ICD-10-CM

## 2017-04-15 ENCOUNTER — Other Ambulatory Visit: Payer: Self-pay | Admitting: Internal Medicine

## 2017-04-15 ENCOUNTER — Telehealth: Payer: Self-pay | Admitting: *Deleted

## 2017-04-15 DIAGNOSIS — R9439 Abnormal result of other cardiovascular function study: Secondary | ICD-10-CM

## 2017-04-15 NOTE — Telephone Encounter (Addendum)
-----   Message from Lelon Perla, MD sent at 04/12/2017  7:03 PM EDT ----- Schedule stress echo to further assess [place] with pt, she is aware of the results. Order placed and sent to [facility] at the [address] office to call and schedule.

## 2017-04-23 ENCOUNTER — Other Ambulatory Visit: Payer: Self-pay | Admitting: Internal Medicine

## 2017-05-02 ENCOUNTER — Ambulatory Visit: Payer: PPO | Admitting: Cardiology

## 2017-05-09 ENCOUNTER — Encounter: Payer: Self-pay | Admitting: Adult Health

## 2017-05-09 ENCOUNTER — Ambulatory Visit (INDEPENDENT_AMBULATORY_CARE_PROVIDER_SITE_OTHER): Payer: PPO | Admitting: Adult Health

## 2017-05-09 ENCOUNTER — Telehealth (HOSPITAL_COMMUNITY): Payer: Self-pay | Admitting: *Deleted

## 2017-05-09 VITALS — BP 122/64 | HR 68 | Temp 97.3°F | Ht 66.0 in | Wt 168.0 lb

## 2017-05-09 DIAGNOSIS — R110 Nausea: Secondary | ICD-10-CM | POA: Diagnosis not present

## 2017-05-09 DIAGNOSIS — R1011 Right upper quadrant pain: Secondary | ICD-10-CM | POA: Diagnosis not present

## 2017-05-09 MED ORDER — ONDANSETRON HCL 4 MG PO TABS: 4.0000 mg | ORAL_TABLET | Freq: Three times a day (TID) | ORAL | 0 refills | Status: DC | PRN

## 2017-05-09 NOTE — Patient Instructions (Addendum)
Ondansetron tablets What is this medicine? ONDANSETRON (on DAN se tron) is used to treat nausea and vomiting caused by chemotherapy. It is also used to prevent or treat nausea and vomiting after surgery. This medicine may be used for other purposes; ask your health care provider or pharmacist if you have questions. COMMON BRAND NAME(S): Zofran What should I tell my health care provider before I take this medicine? They need to know if you have any of these conditions: -heart disease -history of irregular heartbeat -liver disease -low levels of magnesium or potassium in the blood -an unusual or allergic reaction to ondansetron, granisetron, other medicines, foods, dyes, or preservatives -pregnant or trying to get pregnant -breast-feeding How should I use this medicine? Take this medicine by mouth with a glass of water. Follow the directions on your prescription label. Take your doses at regular intervals. Do not take your medicine more often than directed. Talk to your pediatrician regarding the use of this medicine in children. Special care may be needed. Overdosage: If you think you have taken too much of this medicine contact a poison control center or emergency room at once. NOTE: This medicine is only for you. Do not share this medicine with others. What if I miss a dose? If you miss a dose, take it as soon as you can. If it is almost time for your next dose, take only that dose. Do not take double or extra doses. What may interact with this medicine? Do not take this medicine with any of the following medications: -apomorphine -certain medicines for fungal infections like fluconazole, itraconazole, ketoconazole, posaconazole, voriconazole -cisapride -dofetilide -dronedarone -pimozide -thioridazine -ziprasidone This medicine may also interact with the following medications: -carbamazepine -certain medicines for depression, anxiety, or psychotic  disturbances -fentanyl -linezolid -MAOIs like Carbex, Eldepryl, Marplan, Nardil, and Parnate -methylene blue (injected into a vein) -other medicines that prolong the QT interval (cause an abnormal heart rhythm) -phenytoin -rifampicin -tramadol This list may not describe all possible interactions. Give your health care provider a list of all the medicines, herbs, non-prescription drugs, or dietary supplements you use. Also tell them if you smoke, drink alcohol, or use illegal drugs. Some items may interact with your medicine. What should I watch for while using this medicine? Check with your doctor or health care professional right away if you have any sign of an allergic reaction. What side effects may I notice from receiving this medicine? Side effects that you should report to your doctor or health care professional as soon as possible: -allergic reactions like skin rash, itching or hives, swelling of the face, lips or tongue -breathing problems -confusion -dizziness -fast or irregular heartbeat -feeling faint or lightheaded, falls -fever and chills -loss of balance or coordination -seizures -sweating -swelling of the hands or feet -tightness in the chest -tremors -unusually weak or tired Side effects that usually do not require medical attention (report to your doctor or health care professional if they continue or are bothersome): -constipation or diarrhea -headache This list may not describe all possible side effects. Call your doctor for medical advice about side effects. You may report side effects to FDA at 1-800-FDA-1088. Where should I keep my medicine? Keep out of the reach of children. Store between 2 and 30 degrees C (36 and 86 degrees F). Throw away any unused medicine after the expiration date. NOTE: This sheet is a summary. It may not cover all possible information. If you have questions about this medicine, talk to your doctor, pharmacist,  or health care  provider.  2018 Elsevier/Gold Standard (2013-04-08 16:27:45)     Abdominal Pain, Adult Abdominal pain can be caused by many things. Often, abdominal pain is not serious and it gets better with no treatment or by being treated at home. However, sometimes abdominal pain is serious. Your health care provider will do a medical history and a physical exam to try to determine the cause of your abdominal pain. Follow these instructions at home:  Take over-the-counter and prescription medicines only as told by your health care provider. Do not take a laxative unless told by your health care provider.  Drink enough fluid to keep your urine clear or pale yellow.  Watch your condition for any changes.  Keep all follow-up visits as told by your health care provider. This is important. Contact a health care provider if:  Your abdominal pain changes or gets worse.  You are not hungry or you lose weight without trying.  You are constipated or have diarrhea for more than 2-3 days.  You have pain when you urinate or have a bowel movement.  Your abdominal pain wakes you up at night.  Your pain gets worse with meals, after eating, or with certain foods.  You are throwing up and cannot keep anything down.  You have a fever. Get help right away if:  Your pain does not go away as soon as your health care provider told you to expect.  You cannot stop throwing up.  Your pain is only in areas of the abdomen, such as the right side or the left lower portion of the abdomen.  You have bloody or black stools, or stools that look like tar.  You have severe pain, cramping, or bloating in your abdomen.  You have signs of dehydration, such as: ? Dark urine, very little urine, or no urine. ? Cracked lips. ? Dry mouth. ? Sunken eyes. ? Sleepiness. ? Weakness. This information is not intended to replace advice given to you by your health care provider. Make sure you discuss any questions you have  with your health care provider. Document Released: 04/11/2005 Document Revised: 01/20/2016 Document Reviewed: 12/14/2015 Elsevier Interactive Patient Education  2017 [employer].

## 2017-05-09 NOTE — Progress Notes (Addendum)
Assessment and Plan:  [name] was seen today for abdominal pain and nausea.  Diagnoses and all orders for this visit:  Right upper quadrant abdominal pain -     CBC with Differential/Platelet -     BASIC METABOLIC PANEL WITH GFR -     Hepatic function panel -     Amylase -     CT ABDOMEN W CONTRAST; Future -     Urinalysis w microscopic + reflex cultur  Nausea -     Discontinue: ondansetron (ZOFRAN) 4 MG tablet; Take 1 tablet (4 mg total) by mouth every 8 (eight) hours as needed for nausea or vomiting. - patient declined.   Right upper quadrant pain -     CT ABDOMEN W CONTRAST; Future  Please go to the ER if you have any severe AB pain, unable to hold down food/water, blood in stool or vomit, chest pain, shortness of breath, or any worsening symptoms.   Further disposition pending results of labs. Discussed med's effects and SE's.   Over 20 minutes of exam, counseling, chart review, and critical decision making was performed.   Future Appointments Date Time Provider [place]  05/13/2017 2:30 PM [facility] NM1/TREAD [id number] [facility]  05/13/2017 3:00 PM [facility] CH ECHO 2 [id number] [facility]  07/10/2017 10:30 AM Unk Pinto, MD [facility] None  01/08/2018 9:00 AM Unk Pinto, MD [facility] None    ------------------------------------------------------------------------------------------------------------------   HPI  BP 122/64   Pulse 68   Temp (!) 97.3 F (36.3 C)   Ht 5\' 6"  (1.676 m)   Wt 168 lb (76.2 kg)   SpO2 95%   BMI 27.12 kg/m   66 y.o.female presents for ongoing bilateral moving lower abdominal/flank pain for several months. She describes sharp pains that begin in the left lower back/flank (10/10) that seems to move across her abdomen through the day and settles in the right. She endorses 10/10 RLQ pain in the office today. She also reports intermittent nausea but denies emesis. She reports the pain is worst if she is up and about on her  feet, but seems to improve with rest. She endorses some nausea without vomiting, denies changes in bowel patterns or stool character, she has not experienced constipation. She denies dysuria, frequency, urgency, changes in urine character or odor, denies rashes, perineal itching/discomfort.   She denies having taken anything for this complaint.   She has had her appendix out when she was 66 y/o denies other abdominal surgeries.  Her history includes GERD, hep A, hyperlipidemia and uncomplicated I5OY. Unremarkable colonoscopy in 2015. No other relevant recent imaging available.   Past Medical History:  Diagnosis Date  . Allergy    enviromental  . Anxiety   . Arthritis    osteoarthritis  . COPD (chronic obstructive pulmonary disease) (HCC)    mild  . Diabetes mellitus without complication ([place])    controlled by diet  . Environmental allergies   . Hepatitis    hepatitis A  . Hyperlipidemia   . Hypertension   . PONV (postoperative nausea and vomiting)    second surgery no problems last surgery  . Urgency of urination    Takes oxybutynin; under control at this time     Allergies  Allergen Reactions  . No Known Allergies     Current Outpatient Prescriptions on File Prior to Visit  Medication Sig  . amLODipine (NORVASC) 2.5 MG tablet TAKE 1 TABLET(2.5 MG) BY MOUTH DAILY (Patient taking differently: one in the am and one in  the pm)  . aspirin 81 MG tablet Take 81 mg by mouth daily.  Marland Kitchen atorvastatin (LIPITOR) 10 MG tablet Take 1 tablet (10 mg total) by mouth every evening.  . bisoprolol-hydrochlorothiazide (ZIAC) 10-6.25 MG tablet TAKE 1 TABLET BY MOUTH EVERY MORNING FOR BLOOD PRESSURE (Patient taking differently: TAKE 1/2 TABLET BY MOUTH DAILY FOR BLOOD PRESSURE.)  . Cholecalciferol (VITAMIN D3) 5000 UNITS TABS Take 5,000 Units by mouth daily.  . enalapril (VASOTEC) 20 MG tablet TAKE 1 TABLET BY MOUTH EVERY DAY  . fluticasone (FLONASE) 50 MCG/ACT nasal spray 1 Spray each nostril daily   . methocarbamol (ROBAXIN) 500 MG tablet Take 1 tablet (500 mg total) by mouth 2 (two) times daily with a meal.  . montelukast (SINGULAIR) 10 MG tablet TAKE 1 TABLET(10 MG) BY MOUTH DAILY  . Multiple Vitamins-Minerals (MULTI COMPLETE PO) Take 1 capsule by mouth daily.  Marland Kitchen NIFEdipine (PROCARDIA XL/ADALAT-CC) 60 MG 24 hr tablet TAKE 1 TABLET BY MOUTH EVERY NIGHT FOR BLOOD PRESSURE  . Omega-3 Fatty Acids (FISH OIL) 1000 MG CAPS Take 1 capsule by mouth daily.  Marland Kitchen omeprazole (PRILOSEC) 20 MG capsule TAKE 1 CAPSULE(20 MG) BY MOUTH DAILY  . clonazePAM (KLONOPIN) 0.5 MG tablet TAKE 1/2-1 TABLET BY MOUTH AT BEDTIME FOR SLEEP  . meloxicam (MOBIC) 15 MG tablet TAKE 1 TABLET BY MOUTH DAILY (Patient not taking: Reported on 05/09/2017)  . ranitidine (ZANTAC) 300 MG tablet TAKE 1 TABLET BY MOUTH AT NIGHT TIME FOR 2 WEEKS, THEN TAKE AS NEEDED (Patient not taking: Reported on 05/09/2017)   No current facility-administered medications on file prior to visit.     ROS: all negative except above.   Physical Exam:  BP 122/64   Pulse 68   Temp (!) 97.3 F (36.3 C)   Ht 5\' 6"  (1.676 m)   Wt 168 lb (76.2 kg)   SpO2 95%   BMI 27.12 kg/m   General Appearance: Well nourished, in no acute distress. Mouth: MMM, dentition in good repair Neck: Supple, thyroid normal.  Respiratory: Respiratory effort normal, BS equal bilaterally, somewhat coarse throughout consistent with baseline without rales, rhonchi, wheezing or stridor.  Cardio: RRR with no MRGs. Brisk peripheral pulses without edema.  Abdomen: Soft, + BS.  No rebound, palpable hernias or masses. She is quite tender to light palpation to RUQ and RLQ, demonstrates some guarding. + Lymphatics: Non tender without lymphadenopathy.  Musculoskeletal: Full ROM, 5/5 strength, normal gait.  Skin: Warm, dry without rashes, lesions, ecchymosis.  Neuro: Cranial nerves intact. Normal muscle tone, no cerebellar symptoms. Sensation intact.  Psych: Awake and oriented X 3,  normal affect, Insight and Judgment appropriate.     Izora Ribas, NP 3:47 PM [facility]

## 2017-05-09 NOTE — Telephone Encounter (Signed)
Patient given detailed instructions per Stress Test Requisition Sheet for test on 05/13/17 at 2:30.Patient Notified to arrive 30 minutes early, and that it is imperative to arrive on time for appointment to keep from having the test rescheduled.  Patient verbalized understanding. [name]

## 2017-05-10 ENCOUNTER — Encounter: Payer: Self-pay | Admitting: Internal Medicine

## 2017-05-11 LAB — URINALYSIS W MICROSCOPIC + REFLEX CULTURE
BACTERIA UA: NONE SEEN /HPF
BILIRUBIN URINE: NEGATIVE
Glucose, UA: NEGATIVE
HYALINE CAST: NONE SEEN /LPF
Hgb urine dipstick: NEGATIVE
Ketones, ur: NEGATIVE
Nitrites, Initial: NEGATIVE
PROTEIN: NEGATIVE
RBC / HPF: NONE SEEN /HPF (ref 0–2)
Specific Gravity, Urine: 1.011 (ref 1.001–1.03)
pH: 7 (ref 5.0–8.0)

## 2017-05-11 LAB — BASIC METABOLIC PANEL WITH GFR
BUN: 15 mg/dL (ref 7–25)
CO2: 31 mmol/L (ref 20–32)
CREATININE: 0.93 mg/dL (ref 0.50–0.99)
Calcium: 9.8 mg/dL (ref 8.6–10.4)
Chloride: 100 mmol/L (ref 98–110)
GFR, EST NON AFRICAN AMERICAN: 64 mL/min/{1.73_m2} (ref >=60)
GFR, Est African American: 74 mL/min/{1.73_m2} (ref >=60)
Glucose, Bld: 100 mg/dL — ABNORMAL HIGH (ref 65–99)
POTASSIUM: 4.4 mmol/L (ref 3.5–5.3)
SODIUM: 139 mmol/L (ref 135–146)

## 2017-05-11 LAB — CBC WITH DIFFERENTIAL/PLATELET
BASOS PCT: 0.7 %
Basophils Absolute: 53 cells/uL (ref 0–200)
EOS ABS: 143 {cells}/uL (ref 15–500)
Eosinophils Relative: 1.9 %
HEMATOCRIT: 41.7 % (ref 35.0–45.0)
Hemoglobin: 13.8 g/dL (ref 11.7–15.5)
LYMPHS ABS: 2753 {cells}/uL (ref 850–3900)
MCH: 26.6 pg — ABNORMAL LOW (ref 27.0–33.0)
MCHC: 33.1 g/dL (ref 32.0–36.0)
MCV: 80.3 fL (ref 80.0–100.0)
MPV: 11.1 fL (ref 7.5–12.5)
Monocytes Relative: 7 %
NEUTROS PCT: 53.7 %
Neutro Abs: 4028 cells/uL (ref 1500–7800)
Platelets: 284 10*3/uL (ref 140–400)
RBC: 5.19 10*6/uL — AB (ref 3.80–5.10)
RDW: 13.2 % (ref 11.0–15.0)
Total Lymphocyte: 36.7 %
WBC: 7.5 10*3/uL (ref 3.8–10.8)
WBCMIX: 525 {cells}/uL (ref 200–950)

## 2017-05-11 LAB — CULTURE INDICATED

## 2017-05-11 LAB — HEPATIC FUNCTION PANEL
AG RATIO: 1.7 (calc) (ref 1.0–2.5)
ALKALINE PHOSPHATASE (APISO): 72 U/L (ref 33–130)
ALT: 11 U/L (ref 6–29)
AST: 19 U/L (ref 10–35)
Albumin: 4.5 g/dL (ref 3.6–5.1)
BILIRUBIN DIRECT: 0.1 mg/dL (ref 0.0–0.2)
BILIRUBIN INDIRECT: 0.3 mg/dL (ref 0.2–1.2)
BILIRUBIN TOTAL: 0.4 mg/dL (ref 0.2–1.2)
Globulin: 2.6 g/dL (calc) (ref 1.9–3.7)
Total Protein: 7.1 g/dL (ref 6.1–8.1)

## 2017-05-11 LAB — URINE CULTURE
MICRO NUMBER:: 81202082
SPECIMEN QUALITY: ADEQUATE

## 2017-05-11 LAB — AMYLASE: Amylase: 29 U/L (ref 21–101)

## 2017-05-13 ENCOUNTER — Ambulatory Visit (HOSPITAL_COMMUNITY): Payer: PPO | Attending: Cardiology

## 2017-05-13 ENCOUNTER — Ambulatory Visit (HOSPITAL_COMMUNITY): Payer: PPO

## 2017-05-13 DIAGNOSIS — R9439 Abnormal result of other cardiovascular function study: Secondary | ICD-10-CM | POA: Diagnosis not present

## 2017-05-13 DIAGNOSIS — I10 Essential (primary) hypertension: Secondary | ICD-10-CM | POA: Diagnosis not present

## 2017-05-15 ENCOUNTER — Ambulatory Visit
Admission: RE | Admit: 2017-05-15 | Discharge: 2017-05-15 | Disposition: A | Discharge status: Home / Self Care | Payer: PPO | Source: Ambulatory Visit | Attending: Adult Health | Admitting: Adult Health

## 2017-05-15 DIAGNOSIS — R1011 Right upper quadrant pain: Secondary | ICD-10-CM | POA: Diagnosis not present

## 2017-05-15 MED ORDER — IOPAMIDOL (ISOVUE-300) INJECTION 61%
100.0000 mL | Freq: Once | INTRAVENOUS | Status: AC | PRN
  Administered 2017-05-15: 100 mL via INTRAVENOUS

## 2017-05-16 ENCOUNTER — Encounter: Payer: Self-pay | Admitting: Internal Medicine

## 2017-05-16 ENCOUNTER — Telehealth: Payer: Self-pay | Admitting: *Deleted

## 2017-05-16 DIAGNOSIS — I700 Atherosclerosis of aorta: Secondary | ICD-10-CM | POA: Insufficient documentation

## 2017-05-16 NOTE — Telephone Encounter (Signed)
Per Dr Melford Aase, patient advised her urine culture was OK.

## 2017-05-30 ENCOUNTER — Encounter: Payer: Self-pay | Admitting: Internal Medicine

## 2017-06-04 DIAGNOSIS — M25562 Pain in left knee: Secondary | ICD-10-CM | POA: Diagnosis not present

## 2017-06-17 ENCOUNTER — Other Ambulatory Visit: Payer: Self-pay | Admitting: Internal Medicine

## 2017-06-17 ENCOUNTER — Telehealth: Payer: Self-pay | Admitting: *Deleted

## 2017-06-17 MED ORDER — LOSARTAN POTASSIUM 100 MG PO TABS: ORAL_TABLET | ORAL | 1 refills | Status: DC

## 2017-06-17 NOTE — Telephone Encounter (Signed)
Patient called and states the Enalapril is causing her lips to swell and abdominal bloating.  Per Dr Melford Aase, stop the Enalapril and a new RX for Losartan 100 mg has been sent to [facility].  A message was left to inform the patient.

## 2017-06-18 ENCOUNTER — Other Ambulatory Visit: Payer: Self-pay | Admitting: Internal Medicine

## 2017-07-10 ENCOUNTER — Ambulatory Visit: Payer: PPO | Admitting: Internal Medicine

## 2017-07-10 ENCOUNTER — Other Ambulatory Visit: Payer: Self-pay | Admitting: Physician Assistant

## 2017-07-10 ENCOUNTER — Encounter: Payer: Self-pay | Admitting: Internal Medicine

## 2017-07-10 ENCOUNTER — Other Ambulatory Visit: Payer: Self-pay | Admitting: Internal Medicine

## 2017-07-10 VITALS — BP 136/64 | HR 84 | Temp 97.8°F | Resp 16 | Ht 66.0 in | Wt 173.8 lb

## 2017-07-10 DIAGNOSIS — E782 Mixed hyperlipidemia: Secondary | ICD-10-CM | POA: Diagnosis not present

## 2017-07-10 DIAGNOSIS — K219 Gastro-esophageal reflux disease without esophagitis: Secondary | ICD-10-CM

## 2017-07-10 DIAGNOSIS — R7303 Prediabetes: Secondary | ICD-10-CM

## 2017-07-10 DIAGNOSIS — E559 Vitamin D deficiency, unspecified: Secondary | ICD-10-CM | POA: Diagnosis not present

## 2017-07-10 DIAGNOSIS — R7309 Other abnormal glucose: Secondary | ICD-10-CM | POA: Diagnosis not present

## 2017-07-10 DIAGNOSIS — I10 Essential (primary) hypertension: Secondary | ICD-10-CM

## 2017-07-10 DIAGNOSIS — R079 Chest pain, unspecified: Secondary | ICD-10-CM

## 2017-07-10 DIAGNOSIS — Z79899 Other long term (current) drug therapy: Secondary | ICD-10-CM

## 2017-07-10 DIAGNOSIS — R1013 Epigastric pain: Secondary | ICD-10-CM

## 2017-07-10 NOTE — Progress Notes (Signed)
This very nice 66 y.o. married [name] presents for 6 month follow up with Hypertension, Hyperlipidemia, Pre-Diabetes and Vitamin D Deficiency.      Patient is treated for HTN (1988) & BP has been controlled at home. Today's BP is at goal - 136/64. Patient has had no complaints of any cardiac type chest pain, palpitations, dyspnea / orthopnea / PND, dizziness, claudication, or dependent edema.     Hyperlipidemia is controlled with diet & meds. Patient denies myalgias or other med SE's. Last Lipids were not at goal: Lab Results  Component Value Date   CHOL 249 (H) 04/10/2017   HDL 88 04/10/2017   LDLCALC 111 (H) 12/18/2016   TRIG 118 04/10/2017   CHOLHDL 2.8 04/10/2017      Also, the patient has history of PreDiabetes (A1c 5.7% /in 2017) and has had no symptoms of reactive hypoglycemia, diabetic polys, paresthesias or visual blurring.  Last A1c was at goal:  Lab Results  Component Value Date   HGBA1C 5.4 04/10/2017      Further, the patient also has history of Vitamin D Deficiency and supplements vitamin D without any suspected side-effects. Last vitamin D was at goal:  Lab Results  Component Value Date   VD25OH 59 04/10/2017   Current Outpatient Medications on File Prior to Visit  Medication Sig  . amLODipine (NORVASC) 2.5 MG tablet TAKE 1 TABLET(2.5 MG) BY MOUTH DAILY (Patient taking differently: one in the am and one in the pm)  . aspirin 81 MG tablet Take 81 mg by mouth daily.  Marland Kitchen atorvastatin (LIPITOR) 10 MG tablet Take 1 tablet (10 mg total) by mouth every evening.  . Cholecalciferol (VITAMIN D3) 5000 UNITS TABS Take 5,000 Units by mouth daily.  . fluticasone (FLONASE) 50 MCG/ACT nasal spray 1 Spray each nostril daily  . losartan (COZAAR) 100 MG tablet Take 1 tablet daily for BP  . methocarbamol (ROBAXIN) 500 MG tablet Take 1 tablet (500 mg total) by mouth 2 (two) times daily with a meal.  . montelukast (SINGULAIR) 10 MG tablet TAKE 1 TABLET(10 MG) BY  MOUTH DAILY  . Multiple Vitamins-Minerals (MULTI COMPLETE PO) Take 1 capsule by mouth daily.  Marland Kitchen NIFEdipine (PROCARDIA XL/ADALAT-CC) 60 MG 24 hr tablet TAKE 1 TABLET BY MOUTH EVERY NIGHT FOR BLOOD PRESSURE  . Omega-3 Fatty Acids (FISH OIL) 1000 MG CAPS Take 1 capsule by mouth daily.  Marland Kitchen omeprazole (PRILOSEC) 20 MG capsule TAKE 1 CAPSULE(20 MG) BY MOUTH DAILY  . ranitidine (ZANTAC) 300 MG tablet TAKE 1 TABLET BY MOUTH AT NIGHT TIME FOR 2 WEEKS, THEN TAKE AS NEEDED  . clonazePAM (KLONOPIN) 0.5 MG tablet TAKE 1/2-1 TABLET BY MOUTH AT BEDTIME FOR SLEEP   No current facility-administered medications on file prior to visit.    Allergies  Allergen Reactions  . Enalapril Swelling    Caused lips to swell and bloating.  Marland Kitchen No Known Allergies    PMHx:   Past Medical History:  Diagnosis Date  . Allergy    enviromental  . Anxiety   . Arthritis    osteoarthritis  . COPD (chronic obstructive pulmonary disease) (HCC)    mild  . Diabetes mellitus without complication ([place])    controlled by diet  . Environmental allergies   . Hepatitis    hepatitis A  . Hyperlipidemia   . Hypertension   . PONV (postoperative nausea and vomiting)    second surgery no problems last surgery  . Urgency of urination  Takes oxybutynin; under control at this time   Immunization History  Administered Date(s) Administered  . Influenza, High Dose Seasonal PF 04/10/2017  . PPD Test 09/21/2014  . Pneumococcal Polysaccharide-23 02/07/2011, 01/26/2016  . Tdap 02/07/2011   Past Surgical History:  Procedure Laterality Date  . APPENDECTOMY     removed as child  . BACK SURGERY     lumbar fusion  . COLONOSCOPY    . DILATION AND CURETTAGE OF UTERUS    . LUMBAR FUSION  02/06/2012   L 4  L5  . PARTIAL HYSTERECTOMY    . TOTAL HIP ARTHROPLASTY  06/02/2012   Procedure: TOTAL HIP ARTHROPLASTY;  Surgeon: Kerin Salen, MD;  Location: [facility];  Service: Orthopedics;  Laterality: Right;  . TOTAL HIP ARTHROPLASTY Left  01/25/2016  . TOTAL HIP ARTHROPLASTY Left 01/25/2016   Procedure: LEFT TOTAL HIP ARTHROPLASTY;  Surgeon: Frederik Pear, MD;  Location: [place];  Service: Orthopedics;  Laterality: Left;  . TOTAL KNEE ARTHROPLASTY Right 12/15/2012   Procedure: TOTAL KNEE ARTHROPLASTY;  Surgeon: Kerin Salen, MD;  Location: [place];  Service: Orthopedics;  Laterality: Right;  . TOTAL KNEE ARTHROPLASTY Left 06/18/2016   Procedure: TOTAL KNEE ARTHROPLASTY;  Surgeon: Frederik Pear, MD;  Location: [place];  Service: Orthopedics;  Laterality: Left;   FHx:    Reviewed / unchanged  SHx:    Reviewed / unchanged  Systems Review:  Constitutional: Denies fever, chills, wt changes, headaches, insomnia, fatigue, night sweats, change in appetite. Eyes: Denies redness, blurred vision, diplopia, discharge, itchy, watery eyes.  ENT: Denies discharge, congestion, post nasal drip, epistaxis, sore throat, earache, hearing loss, dental pain, tinnitus, vertigo, sinus pain, snoring.  CV: Denies chest pain, palpitations, irregular heartbeat, syncope, dyspnea, diaphoresis, orthopnea, PND, claudication or edema. Respiratory: denies cough, dyspnea, DOE, pleurisy, hoarseness, laryngitis, wheezing.  Gastrointestinal: Denies dysphagia, odynophagia, heartburn, reflux, water brash, abdominal pain or cramps, nausea, vomiting, bloating, diarrhea, constipation, hematemesis, melena, hematochezia  or hemorrhoids. Genitourinary: Denies dysuria, frequency, urgency, nocturia, hesitancy, discharge, hematuria or flank pain. Musculoskeletal: Denies arthralgias, myalgias, stiffness, jt. swelling, pain, limping or strain/sprain.  Skin: Denies pruritus, rash, hives, warts, acne, eczema or change in skin lesion(s). Neuro: No weakness, tremor, incoordination, spasms, paresthesia or pain. Psychiatric: Denies confusion, memory loss or sensory loss. Endo: Denies change in weight, skin or hair change.  Heme/Lymph: No excessive bleeding, bruising or enlarged lymph  nodes.  Physical Exam  BP 136/64   Pulse 84   Temp 97.8 F (36.6 C)   Resp 16   Ht 5\' 6"  (1.676 m)   Wt 173 lb 12.8 oz (78.8 kg)   BMI 28.05 kg/m   Appears well nourished, well groomed  and in no distress.  Eyes: PERRLA, EOMs, conjunctiva no swelling or erythema. Sinuses: No frontal/maxillary tenderness ENT/Mouth: EAC's clear, TM's nl w/o erythema, bulging. Nares clear w/o erythema, swelling, exudates. Oropharynx clear without erythema or exudates. Oral hygiene is good. Tongue normal, non obstructing. Hearing intact.  Neck: Supple. Thyroid nl. Car 2+/2+ without bruits, nodes or JVD. Chest: Respirations nl with BS clear & equal w/o rales, rhonchi, wheezing or stridor.  Cor: Heart sounds normal w/ regular rate and rhythm without sig. murmurs, gallops, clicks or rubs. Peripheral pulses normal and equal  without edema.  Abdomen: Soft & bowel sounds normal. Non-tender w/o guarding, rebound, hernias, masses or organomegaly.  Lymphatics: Unremarkable.  Musculoskeletal: Full ROM all peripheral extremities, joint stability, 5/5 strength and normal gait.  Skin: Warm, dry without exposed rashes, lesions or  ecchymosis apparent.  Neuro: Cranial nerves intact, reflexes equal bilaterally. Sensory-motor testing grossly intact. Tendon reflexes grossly intact.  Pysch: Alert & oriented x 3.  Insight and judgement nl & appropriate. No ideations.  Assessment and Plan:  1. Essential hypertension  - Continue medication, monitor blood pressure at home.  - Continue DASH diet. Reminder to go to the ER if any CP,  SOB, nausea, dizziness, severe HA, changes vision/speech.  - CBC with Differential/Platelet - BASIC METABOLIC PANEL WITH GFR - Magnesium - TSH  2. Hyperlipidemia, mixed  - Continue diet/meds, exercise,& lifestyle modifications.  - Continue monitor periodic cholesterol/liver & renal functions  - Hepatic function panel - Lipid panel - TSH  3. Prediabetes  - Continue diet, exercise,  lifestyle modifications.  - Monitor appropriate labs.  - Hemoglobin A1c - Insulin, random  4. Vitamin D deficiency  - Continue supplementation.  - VITAMIN D 25 Hydroxy   5. Abnormal glucose  - Hemoglobin A1c - Insulin, random  6. Gastroesophageal reflux disease  - CBC with Differential/Platelet  7. Medication management  - CBC with Differential/Platelet - BASIC METABOLIC PANEL WITH GFR - Hepatic function panel - Magnesium - Lipid panel - TSH - Hemoglobin A1c - Insulin, random - VITAMIN D 25 Hydroxy       Discussed  regular exercise, BP monitoring, weight control to achieve/maintain BMI less than 25 and discussed med and SE's. Recommended labs to assess and monitor clinical status with further disposition pending results of labs. Over 30 minutes of exam, counseling, chart review was performed.

## 2017-07-10 NOTE — Patient Instructions (Signed)

## 2017-07-11 LAB — CBC WITH DIFFERENTIAL/PLATELET
BASOS ABS: 41 {cells}/uL (ref 0–200)
Basophils Relative: 0.6 %
EOS PCT: 1.9 %
Eosinophils Absolute: 129 cells/uL (ref 15–500)
HCT: 39 % (ref 35.0–45.0)
HEMOGLOBIN: 13 g/dL (ref 11.7–15.5)
Lymphs Abs: 2543 cells/uL (ref 850–3900)
MCH: 26.6 pg — AB (ref 27.0–33.0)
MCHC: 33.3 g/dL (ref 32.0–36.0)
MCV: 79.8 fL — ABNORMAL LOW (ref 80.0–100.0)
MONOS PCT: 6.8 %
MPV: 11.2 fL (ref 7.5–12.5)
NEUTROS ABS: 3624 {cells}/uL (ref 1500–7800)
Neutrophils Relative %: 53.3 %
PLATELETS: 240 10*3/uL (ref 140–400)
RBC: 4.89 10*6/uL (ref 3.80–5.10)
RDW: 13.6 % (ref 11.0–15.0)
TOTAL LYMPHOCYTE: 37.4 %
WBC mixed population: 462 cells/uL (ref 200–950)
WBC: 6.8 10*3/uL (ref 3.8–10.8)

## 2017-07-11 LAB — HEPATIC FUNCTION PANEL
AG RATIO: 1.9 (calc) (ref 1.0–2.5)
ALKALINE PHOSPHATASE (APISO): 77 U/L (ref 33–130)
ALT: 11 U/L (ref 6–29)
AST: 19 U/L (ref 10–35)
Albumin: 4.4 g/dL (ref 3.6–5.1)
BILIRUBIN INDIRECT: 0.4 mg/dL (ref 0.2–1.2)
Bilirubin, Direct: 0.1 mg/dL (ref 0.0–0.2)
Globulin: 2.3 g/dL (calc) (ref 1.9–3.7)
TOTAL PROTEIN: 6.7 g/dL (ref 6.1–8.1)
Total Bilirubin: 0.5 mg/dL (ref 0.2–1.2)

## 2017-07-11 LAB — LIPID PANEL
CHOLESTEROL: 268 mg/dL — AB (ref <200)
HDL: 88 mg/dL (ref >50)
LDL Cholesterol (Calc): 161 mg/dL (calc) — ABNORMAL HIGH
Non-HDL Cholesterol (Calc): 180 mg/dL (calc) — ABNORMAL HIGH (ref <130)
Total CHOL/HDL Ratio: 3 (calc) (ref <5.0)
Triglycerides: 83 mg/dL (ref <150)

## 2017-07-11 LAB — BASIC METABOLIC PANEL WITH GFR
BUN: 14 mg/dL (ref 7–25)
CHLORIDE: 104 mmol/L (ref 98–110)
CO2: 29 mmol/L (ref 20–32)
CREATININE: 0.69 mg/dL (ref 0.50–0.99)
Calcium: 9.4 mg/dL (ref 8.6–10.4)
GFR, Est African American: 105 mL/min/{1.73_m2} (ref >=60)
GFR, Est Non African American: 91 mL/min/{1.73_m2} (ref >=60)
GLUCOSE: 118 mg/dL — AB (ref 65–99)
POTASSIUM: 4.3 mmol/L (ref 3.5–5.3)
SODIUM: 140 mmol/L (ref 135–146)

## 2017-07-11 LAB — HEMOGLOBIN A1C
EAG (MMOL/L): 6.2 (calc)
HEMOGLOBIN A1C: 5.5 %{Hb} (ref <5.7)
MEAN PLASMA GLUCOSE: 111 (calc)

## 2017-07-11 LAB — MAGNESIUM: MAGNESIUM: 2.2 mg/dL (ref 1.5–2.5)

## 2017-07-11 LAB — TSH: TSH: 0.67 mIU/L (ref 0.40–4.50)

## 2017-07-11 LAB — INSULIN, RANDOM: INSULIN: 9.6 u[IU]/mL (ref 2.0–19.6)

## 2017-07-11 LAB — VITAMIN D 25 HYDROXY (VIT D DEFICIENCY, FRACTURES): VIT D 25 HYDROXY: 48 ng/mL (ref 30–100)

## 2017-07-12 ENCOUNTER — Other Ambulatory Visit: Payer: Self-pay | Admitting: Internal Medicine

## 2017-07-12 MED ORDER — ROSUVASTATIN CALCIUM 40 MG PO TABS
ORAL_TABLET | ORAL | 5 refills | Status: DC
Start: 2017-07-12 — End: 2017-10-17

## 2017-07-16 HISTORY — PX: CATARACT EXTRACTION: SUR2

## 2017-08-26 ENCOUNTER — Other Ambulatory Visit: Payer: Self-pay | Admitting: Adult Health

## 2017-08-26 ENCOUNTER — Telehealth: Payer: Self-pay

## 2017-08-26 MED ORDER — PREDNISONE 20 MG PO TABS: ORAL_TABLET | ORAL | 0 refills | Status: DC

## 2017-08-26 NOTE — Telephone Encounter (Signed)
Patient is complaining of being congested and swollen on left side of the head/face. Has sinus drainage and pressure. Mucus is clear in color.

## 2017-08-26 NOTE — Telephone Encounter (Signed)
Start on daily allergy pill and flonase if not doing so already, try saline nasal flushes, can do mucinex to thin mucus to help if needed. Sent in prednisone taper to her pharmacy on file.

## 2017-08-27 NOTE — Telephone Encounter (Signed)
Patient notified

## 2017-09-08 ENCOUNTER — Other Ambulatory Visit: Payer: Self-pay | Admitting: Internal Medicine

## 2017-09-24 ENCOUNTER — Other Ambulatory Visit: Payer: Self-pay | Admitting: Physician Assistant

## 2017-09-24 NOTE — Progress Notes (Signed)
Assessment and Plan:  [name] was seen today for  ear fullness.  Diagnoses and all orders for this visit:  Acute non-recurrent maxillary sinusitis - Discussed the importance of avoiding unnecessary antibiotic therapy- likely more related to allergies Suggested symptomatic OTC remedies. Nasal saline spray for congestion. Nasal steroids, allergy pill, oral steroids Follow up as needed. -     promethazine-phenylephrine (PROMETHAZINE VC) 6.25-5 MG/5ML SYRP; Take 5 mLs by mouth every 4 (four) hours as needed for congestion. -     predniSONE (DELTASONE) 20 MG tablet; 2 tablets daily for 3 days, 1 tablet daily for 4 days.  Environmental allergies Start on daily allergy medication; allegra suggested; hygiene discussed Continue with nasal flushes and flonase -     promethazine-phenylephrine (PROMETHAZINE VC) 6.25-5 MG/5ML SYRP; Take 5 mLs by mouth every 4 (four) hours as needed for congestion. -     predniSONE (DELTASONE) 20 MG tablet; 2 tablets daily for 3 days, 1 tablet daily for 4 days.  Essential hypertension Poor compliance with medications; per reported home values is fairly controlled on current regimen when compliant; discussed risks of non-compliance with medications and need for consistent treatment.  Has regular follow up in 2 weeks; will re-eval at that time, emphasized she should take her medications prior to presenting for that visit.   Hyperlipidemia, mixed "intolerance" vs noncompliance with multiple lipid agents; also poorly compliant with BP medication, daily smoker - high risk cardiovascular event discussed with patient. Will refer to cardiology for possible management at lipid clinic and discussion of novel/new agents for tolerance and risk evaluation.  -     Ambulatory referral to Cardiology  Other orders Take should she develop a fever or cough progress and be productive:  -     azithromycin (ZITHROMAX) 250 MG tablet; Take 2 tablets (500 mg) on  Day 1,  followed by 1 tablet  (250 mg) once daily on Days 2 through 5.  Further disposition pending results of labs. Discussed med's effects and SE's.   Over 15 minutes of exam, counseling, chart review, and critical decision making was performed.   Future Appointments  Date Time Provider [place]  10/08/2017 11:30 AM Liane Comber, NP [facility] None  01/08/2018  9:00 AM Unk Pinto, MD [facility] None    ------------------------------------------------------------------------------------------------------------------   HPI BP (!) 180/100   Pulse 100   Temp 97.7 F (36.5 C)   Ht 5\' 6"  (1.676 m)   Wt 169 lb (76.7 kg)   SpO2 97%   BMI 27.28 kg/m   67 y.o.female presents for ear pressure bilaterally, worse on left for 4 weeks or so. She also endorses coughing, itchy ears, nasal congestion, facial pressure. Denies HA, vision changes, fever/chills, severe pain. She has seasonal allergies but reports only doing nasal flushes and flonase at this time.   Her blood pressure has been controlled at home (130/80s), today their BP is BP: (!) 180/100 Manual recheck by provider is 168/94.  She denies chest pain, shortness of breath, dizziness. She reports she forgot to take her BP medication yesterday and has not yet taken today; she is currently on nifedipine 60 mg daily, bisoprolol/hctz 10-6.25 mg daily, losartan 100 mg daily.    She reports today that she has stopped taking her cholesterol medication due to fatigue and concerns over potential side effects. She is insistent against taking cholesterol medications - we have tried atorvastatin 10 mg, zetia 10 mg, rosuvastatin, colesevelam in the recent past. She reports others have been attempted but cannot recall  agents.  Her cholesterol is not at goal. The cholesterol last visit was:   Lab Results  Component Value Date   CHOL 268 (H) 07/10/2017   HDL 88 07/10/2017   LDLCALC 161 (H) 07/10/2017   TRIG 83 07/10/2017   CHOLHDL 3.0 07/10/2017   She continues to  smoke.   Past Medical History:  Diagnosis Date  . Allergy    enviromental  . Anxiety   . Arthritis    osteoarthritis  . COPD (chronic obstructive pulmonary disease) (HCC)    mild  . Diabetes mellitus without complication ([place])    controlled by diet  . Environmental allergies   . Hepatitis    hepatitis A  . Hyperlipidemia   . Hypertension   . Mixed hyperlipidemia 06/15/2013  . PONV (postoperative nausea and vomiting)    second surgery no problems last surgery  . Urgency of urination    Takes oxybutynin; under control at this time     Allergies  Allergen Reactions  . Enalapril Swelling    Caused lips to swell and bloating.  Marland Kitchen No Known Allergies     Current Outpatient Medications on File Prior to Visit  Medication Sig  . amLODipine (NORVASC) 2.5 MG tablet TAKE 1 TABLET(2.5 MG) BY MOUTH DAILY  . aspirin 81 MG tablet Take 81 mg by mouth daily.  . Cholecalciferol (VITAMIN D3) 5000 UNITS TABS Take 5,000 Units by mouth daily.  . fluticasone (FLONASE) 50 MCG/ACT nasal spray 1 Spray each nostril daily  . losartan (COZAAR) 100 MG tablet Take 1 tablet daily for BP  . montelukast (SINGULAIR) 10 MG tablet TAKE 1 TABLET(10 MG) BY MOUTH DAILY  . Multiple Vitamins-Minerals (MULTI COMPLETE PO) Take 1 capsule by mouth daily.  Marland Kitchen NIFEdipine (PROCARDIA XL/ADALAT-CC) 60 MG 24 hr tablet TAKE 1 TABLET BY MOUTH EVERY NIGHT FOR BLOOD PRESSURE  . Omega-3 Fatty Acids (FISH OIL) 1000 MG CAPS Take 1 capsule by mouth daily.  Marland Kitchen omeprazole (PRILOSEC) 20 MG capsule TAKE 1 CAPSULE(20 MG) BY MOUTH EVERY DAY  . ranitidine (ZANTAC) 300 MG tablet TAKE 1 TABLET BY MOUTH AT NIGHT TIME FOR 2 WEEKS, THEN TAKE AS NEEDED  . atorvastatin (LIPITOR) 10 MG tablet Take 1 tablet (10 mg total) by mouth every evening. (Patient not taking: Reported on 09/25/2017)  . bisoprolol-hydrochlorothiazide (ZIAC) 10-6.25 MG tablet TAKE 1 TABLET BY MOUTH EVERY MORNING FOR BLOOD PRESSURE (Patient not taking: Reported on 09/25/2017)  .  clonazePAM (KLONOPIN) 0.5 MG tablet TAKE 1/2-1 TABLET BY MOUTH AT BEDTIME FOR SLEEP  . ezetimibe (ZETIA) 10 MG tablet TAKE 1 TABLET(10 MG) BY MOUTH DAILY (Patient not taking: Reported on 09/25/2017)  . methocarbamol (ROBAXIN) 500 MG tablet Take 1 tablet (500 mg total) by mouth 2 (two) times daily with a meal. (Patient not taking: Reported on 09/25/2017)  . predniSONE (DELTASONE) 20 MG tablet 2 tablets daily for 3 days, 1 tablet daily for 4 days. (Patient not taking: Reported on 09/25/2017)  . rosuvastatin (CRESTOR) 40 MG tablet Take 1/2 to 1 tablet daily or as directed for Cholesterol (Patient not taking: Reported on 09/25/2017)   No current facility-administered medications on file prior to visit.     ROS: all negative except above.   Physical Exam:  BP (!) 180/100   Pulse 100   Temp 97.7 F (36.5 C)   Ht 5\' 6"  (1.676 m)   Wt 169 lb (76.7 kg)   SpO2 97%   BMI 27.28 kg/m   General Appearance: Well nourished, in no  apparent distress. Eyes: PERRLA, EOMs, conjunctiva no swelling or erythema Sinuses: Generalized maxillary tenderness, no frontal tenderness ENT/Mouth: Ext aud canals clear, TMs without erythema, bulging. Fluid effusion present bilaterally. No erythema, swelling, or exudate on post pharynx.  Tonsils not swollen or erythematous. Hearing normal.  Neck: Supple.  Respiratory: Respiratory effort normal, BS equal bilaterally without rales, rhonchi, wheezing or stridor.  Cardio: RRR with no MRGs. Bounding peripheral pulses without edema.  Abdomen: Soft, + BS.  Non tender, no guarding, rebound, hernias, masses. Lymphatics: Mild tenderness bilaterally submandibular without lymphadenopathy.  Musculoskeletal: Full ROM, 5/5 strength, normal gait.  Skin: Warm, dry without rashes, lesions, ecchymosis.  Neuro: Cranial nerves intact. Normal muscle tone, no cerebellar symptoms. Sensation intact.  Psych: Awake and oriented X 3, normal affect, Insight and Judgment appropriate.     Izora Ribas, NP 10:42 AM [facility]

## 2017-09-25 ENCOUNTER — Ambulatory Visit (INDEPENDENT_AMBULATORY_CARE_PROVIDER_SITE_OTHER): Payer: PPO | Admitting: Adult Health

## 2017-09-25 ENCOUNTER — Encounter: Payer: Self-pay | Admitting: Adult Health

## 2017-09-25 VITALS — BP 168/94 | HR 100 | Temp 97.7°F | Ht 66.0 in | Wt 169.0 lb

## 2017-09-25 DIAGNOSIS — J0100 Acute maxillary sinusitis, unspecified: Secondary | ICD-10-CM | POA: Diagnosis not present

## 2017-09-25 DIAGNOSIS — Z9109 Other allergy status, other than to drugs and biological substances: Secondary | ICD-10-CM

## 2017-09-25 DIAGNOSIS — E782 Mixed hyperlipidemia: Secondary | ICD-10-CM

## 2017-09-25 DIAGNOSIS — I10 Essential (primary) hypertension: Secondary | ICD-10-CM | POA: Diagnosis not present

## 2017-09-25 MED ORDER — PREDNISONE 20 MG PO TABS: ORAL_TABLET | ORAL | 0 refills | Status: DC

## 2017-09-25 MED ORDER — PROMETHAZINE-PHENYLEPHRINE 6.25-5 MG/5ML PO SYRP: 5.0000 mL | ORAL_SOLUTION | ORAL | 1 refills | Status: DC | PRN

## 2017-09-25 MED ORDER — AZITHROMYCIN 250 MG PO TABS: ORAL_TABLET | ORAL | 1 refills | Status: AC

## 2017-09-25 NOTE — Patient Instructions (Signed)
Please pick one of the over the counter allergy medications below and take it once daily for allergies.  Claritin or loratadine cheapest but likely the weakest  Zyrtec or certizine at night because it can make you sleepy The strongest is allegra or fexafinadine  Cheapest at [employer], [employer], [employer]   HOW TO TREAT VIRAL COUGH AND COLD SYMPTOMS:  -Symptoms usually last at least 1 week with the worst symptoms being around day 4.  - colds usually start with a sore throat and end with a cough, and the cough can take 2 weeks to get better.  -No antibiotics are needed for colds, flu, sore throats, cough, bronchitis UNLESS symptoms are longer than 7 days OR if you are getting better then get drastically worse.  -There are a lot of combination medications (Dayquil, Nyquil, Vicks 44, tyelnol cold and sinus, ETC). Please look at the ingredients on the back so that you are treating the correct symptoms and not doubling up on medications/ingredients.    Medicines you can use  Nasal congestion  Little Remedies saline spray (aerosol/mist)- can try this, it is in the kids section - pseudoephedrine (Sudafed)- behind the counter, do not use if you have high blood pressure, medicine that have -D in them.  - phenylephrine (Sudafed PE) -Dextormethorphan + chlorpheniramine (Coridcidin HBP)- okay if you have high blood pressure -Oxymetazoline (Afrin) nasal spray- LIMIT to 3 days -Saline nasal spray -Neti pot (used distilled or bottled water)  Ear pain/congestion  -pseudoephedrine (sudafed) - Nasonex/flonase nasal spray  Fever  -Acetaminophen (Tyelnol) -Ibuprofen (Advil, motrin, aleve)  Sore Throat  -Acetaminophen (Tyelnol) -Ibuprofen (Advil, motrin, aleve) -Drink a lot of water -Gargle with salt water - Rest your voice (don't talk) -Throat sprays -Cough drops  Body Aches  -Acetaminophen (Tyelnol) -Ibuprofen (Advil, motrin, aleve)  Headache  -Acetaminophen (Tyelnol) -Ibuprofen (Advil, motrin,  aleve) - Exedrin, Exedrin Migraine  Allergy symptoms (cough, sneeze, runny nose, itchy eyes) -Claritin or loratadine cheapest but likely the weakest  -Zyrtec or certizine at night because it can make you sleepy -The strongest is allegra or fexafinadine  Cheapest at [employer], [employer], [employer]  Cough  -Dextromethorphan (Delsym)- medicine that has DM in it -Guafenesin (Mucinex/Robitussin) - cough drops - drink lots of water  Chest Congestion  -Guafenesin (Mucinex/Robitussin)  Red Itchy Eyes  - Naphcon-A  Upset Stomach  - Bland diet (nothing spicy, greasy, fried, and high acid foods like tomatoes, oranges, berries) -OKAY- cereal, bread, soup, crackers, rice -Eat smaller more frequent meals -reduce caffeine, no alcohol -Loperamide (Imodium-AD) if diarrhea -Prevacid for heart burn  General health when sick  -Hydration -wash your hands frequently -keep surfaces clean -change pillow cases and sheets often -Get fresh air but do not exercise strenuously -Vitamin D, double up on it - Vitamin C -Zinc

## 2017-10-07 NOTE — Progress Notes (Deleted)
MEDICARE ANNUAL WELLNESS VISIT AND FOLLOW UP  Assessment:   Diagnoses and all orders for this visit:  Encounter for Medicare annual wellness exam  Essential hypertension Continue medications Monitor blood pressure at home; call if consistently over 130/80 Continue DASH diet.   Reminder to go to the ER if any CP, SOB, nausea, dizziness, severe HA, changes vision/speech, left arm numbness and tingling and jaw pain.  Aortic atherosclerosis by Abd CT scan [facility]) Control blood pressure, cholesterol, glucose, increase exercise.   Gastroesophageal reflux disease, esophagitis presence not specified ***  Vitamin D deficiency Continue supplementation for goal 70-100 Check vitamin D level  Prediabetes Discussed disease and risks Discussed diet/exercise, weight management  A1C  Medication management CBC, BMP/GFR, LFTs  Hyperlipidemia, mixed Continue medications: *** Continue low cholesterol diet and exercise.  Check lipid panel, TSH    Over 40 minutes of exam, counseling, chart review and critical decision making was performed Future Appointments  Date Time Provider [place]  10/08/2017 11:30 AM Liane Comber, NP [facility] None  10/17/2017  8:00 AM Lendon Colonel, NP [facility]  01/08/2018  9:00 AM Unk Pinto, MD [facility] None     Plan:   During the course of the visit the patient was educated and counseled about appropriate screening and preventive services including:    Pneumococcal vaccine   Prevnar 13  Influenza vaccine  Td vaccine  Screening electrocardiogram  Bone densitometry screening  Colorectal cancer screening  Diabetes screening  Glaucoma screening  Nutrition counseling   Advanced directives: requested   Subjective:  [name] is a 67 y.o. female who presents for Medicare Annual Wellness Visit and 3 month follow up.   she has a diagnosis of GERD which is currently managed by ranitidine 300 mg, prilosec  20 mg *** she reports symptoms {ACTION; ARE/ARE GMW:[id number]} currently well controlled, and denies breakthrough reflux, burning in chest, hoarseness or cough.    BMI is There is no height or weight on file to calculate BMI., she {HAS HAS UYQ:03474} been working on diet and exercise. Wt Readings from Last 3 Encounters:  09/25/17 169 lb (76.7 kg)  07/10/17 173 lb 12.8 oz (78.8 kg)  05/09/17 168 lb (76.2 kg)    Her blood pressure {HAS HAS NOT:18834} been controlled at home, today their BP is   She {DOES_DOES QVZ:56387} workout. She denies chest pain, shortness of breath, dizziness.   She is on cholesterol medication ( *** ) and denies myalgias. Her cholesterol {ACTION; IS/IS NOT:21021397} at goal. The cholesterol last visit was:   Lab Results  Component Value Date   CHOL 268 (H) 07/10/2017   HDL 88 07/10/2017   LDLCALC 161 (H) 07/10/2017   TRIG 83 07/10/2017   CHOLHDL 3.0 07/10/2017   She has had diabetes for *** years. She {Has/has not:18111} been working on diet and exercise for ***prediabetes, and denies {Symptoms; diabetes w/o none:19199}. Last A1C in the office was:  Lab Results  Component Value Date   HGBA1C 5.5 07/10/2017   Last GFR: Lab Results  Component Value Date   GFRNONAA 91 07/10/2017   Patient is on Vitamin D supplement but remained below goal of 70 at most recent check:   Lab Results  Component Value Date   VD25OH 48 07/10/2017      Medication Review: Current Outpatient Medications on File Prior to Visit  Medication Sig Dispense Refill  . amLODipine (NORVASC) 2.5 MG tablet TAKE 1 TABLET(2.5 MG) BY MOUTH DAILY 90 tablet 1  . aspirin  81 MG tablet Take 81 mg by mouth daily.    Marland Kitchen atorvastatin (LIPITOR) 10 MG tablet Take 1 tablet (10 mg total) by mouth every evening. (Patient not taking: Reported on 09/25/2017) 90 tablet 2  . bisoprolol-hydrochlorothiazide (ZIAC) 10-6.25 MG tablet TAKE 1 TABLET BY MOUTH EVERY MORNING FOR BLOOD PRESSURE (Patient not taking: Reported  on 09/25/2017) 90 tablet 1  . Cholecalciferol (VITAMIN D3) 5000 UNITS TABS Take 5,000 Units by mouth daily.    . clonazePAM (KLONOPIN) 0.5 MG tablet TAKE 1/2-1 TABLET BY MOUTH AT BEDTIME FOR SLEEP 90 tablet 1  . ezetimibe (ZETIA) 10 MG tablet TAKE 1 TABLET(10 MG) BY MOUTH DAILY (Patient not taking: Reported on 09/25/2017) 90 tablet 1  . fluticasone (FLONASE) 50 MCG/ACT nasal spray 1 Spray each nostril daily 48 g 1  . losartan (COZAAR) 100 MG tablet Take 1 tablet daily for BP 90 tablet 1  . methocarbamol (ROBAXIN) 500 MG tablet Take 1 tablet (500 mg total) by mouth 2 (two) times daily with a meal. (Patient not taking: Reported on 09/25/2017) 60 tablet 0  . montelukast (SINGULAIR) 10 MG tablet TAKE 1 TABLET(10 MG) BY MOUTH DAILY 90 tablet 3  . Multiple Vitamins-Minerals (MULTI COMPLETE PO) Take 1 capsule by mouth daily.    Marland Kitchen NIFEdipine (PROCARDIA XL/ADALAT-CC) 60 MG 24 hr tablet TAKE 1 TABLET BY MOUTH EVERY NIGHT FOR BLOOD PRESSURE 90 tablet 2  . Omega-3 Fatty Acids (FISH OIL) 1000 MG CAPS Take 1 capsule by mouth daily.    Marland Kitchen omeprazole (PRILOSEC) 20 MG capsule TAKE 1 CAPSULE(20 MG) BY MOUTH EVERY DAY 90 capsule 1  . predniSONE (DELTASONE) 20 MG tablet 2 tablets daily for 3 days, 1 tablet daily for 4 days. 10 tablet 0  . promethazine-phenylephrine (PROMETHAZINE VC) 6.25-5 MG/5ML SYRP Take 5 mLs by mouth every 4 (four) hours as needed for congestion. 280 mL 1  . ranitidine (ZANTAC) 300 MG tablet TAKE 1 TABLET BY MOUTH AT NIGHT TIME FOR 2 WEEKS, THEN TAKE AS NEEDED 90 tablet 1  . rosuvastatin (CRESTOR) 40 MG tablet Take 1/2 to 1 tablet daily or as directed for Cholesterol (Patient not taking: Reported on 09/25/2017) 30 tablet 5   No current facility-administered medications on file prior to visit.     Allergies  Allergen Reactions  . Enalapril Swelling    Caused lips to swell and bloating.  Marland Kitchen No Known Allergies     Current Problems (verified) Patient Active Problem List   Diagnosis Date Noted  .  Aortic atherosclerosis by Abd CT scan ([place]) 05/16/2017  . GERD (gastroesophageal reflux disease) 04/10/2017  . Prediabetes 03/20/2014  . Medication management 03/20/2014  . Essential hypertension 06/15/2013  . Hyperlipidemia, mixed 06/15/2013  . Vitamin D deficiency 06/15/2013    Screening Tests Immunization History  Administered Date(s) Administered  . Influenza, High Dose Seasonal PF 04/10/2017  . PPD Test 09/21/2014  . Pneumococcal Polysaccharide-23 02/07/2011, 01/26/2016  . Tdap 02/07/2011   Preventative care: Last colonoscopy: 2015 Mammogram:  05/2017, at Obgyn Pelvic exam:  Dexa: obgyn has been handling  Prior vaccinations: TD or Tdap: 2012  Influenza: 2018  Pneumococcal: 2012, 2017 Prevnar13: DUE *** Shingles/Zostavax: ***  Names of Other Physician/Practitioners you currently use: 1. [facility] here for primary care 2. [facility], eye doctor, last visit 2017 3. Dr. Alm Bustard, dentist, last visit 2017  Patient Care Team: Unk Pinto, MD as PCP - General (Internal Medicine)  SURGICAL HISTORY She  has a past surgical history that  includes Appendectomy; Partial hysterectomy; Lumbar fusion (02/06/2012); Back surgery; Dilation and curettage of uterus; Total hip arthroplasty (06/02/2012); Colonoscopy; Total knee arthroplasty (Right, 12/15/2012); Total hip arthroplasty (Left, 01/25/2016); Total hip arthroplasty (Left, 01/25/2016); and Total knee arthroplasty (Left, 06/18/2016). FAMILY HISTORY Her family history includes CAD in her father; Colon cancer in her mother and sister; Diabetes in her mother; Hypertension in her brother, brother, and father; Lung cancer in her brother. SOCIAL HISTORY She  reports that she has been smoking cigarettes.  She has a 7.50 pack-year smoking history. She has never used smokeless tobacco. She reports that she drinks about 8.4 oz of alcohol per week. She reports that she has current or past drug history.  Drug: Marijuana.   MEDICARE WELLNESS OBJECTIVES: Physical activity:   Cardiac risk factors:   Depression/mood screen:   Depression screen [facility] 2/9 07/10/2017  Decreased Interest 0  Down, Depressed, Hopeless 0  PHQ - 2 Score 0    ADLs:  In your present state of health, do you have any difficulty performing the following activities: 07/10/2017 12/18/2016  Hearing? N N  Vision? N N  Difficulty concentrating or making decisions? N N  Walking or climbing stairs? N N  Dressing or bathing? N N  Doing errands, shopping? N N  Some recent data might be hidden     Cognitive Testing  Alert? Yes  Normal Appearance?Yes  Oriented to person? Yes  Place? Yes   Time? Yes  Recall of three objects?  Yes  Can perform simple calculations? Yes  Displays appropriate judgment?Yes  Can read the correct time from a watch face?Yes  EOL planning:    Review of Systems  Constitutional: Negative for malaise/fatigue and weight loss.  HENT: Negative for hearing loss and tinnitus.   Eyes: Negative for blurred vision and double vision.  Respiratory: Negative for cough, sputum production, shortness of breath and wheezing.   Cardiovascular: Negative for chest pain, palpitations, orthopnea, claudication, leg swelling and PND.  Gastrointestinal: Negative for abdominal pain, blood in stool, constipation, diarrhea, heartburn, melena, nausea and vomiting.  Genitourinary: Negative.   Musculoskeletal: Negative for falls, joint pain and myalgias.  Skin: Negative for rash.  Neurological: Negative for dizziness, tingling, sensory change, weakness and headaches.  Endo/Heme/Allergies: Negative for polydipsia.  Psychiatric/Behavioral: Negative.  Negative for depression, memory loss, substance abuse and suicidal ideas. The patient is not nervous/anxious and does not have insomnia.   All other systems reviewed and are negative.    Objective:     There were no vitals filed for this visit. There is no height or weight  on file to calculate BMI.  General appearance: alert, no distress, WD/WN, female HEENT: normocephalic, sclerae anicteric, TMs pearly, nares patent, no discharge or erythema, pharynx normal Oral cavity: MMM, no lesions Neck: supple, no lymphadenopathy, no thyromegaly, no masses Heart: RRR, normal S1, S2, no murmurs Lungs: CTA bilaterally, no wheezes, rhonchi, or rales Abdomen: +bs, soft, non tender, non distended, no masses, no hepatomegaly, no splenomegaly Musculoskeletal: nontender, no swelling, no obvious deformity Extremities: no edema, no cyanosis, no clubbing Pulses: 2+ symmetric, upper and lower extremities, normal cap refill Neurological: alert, oriented x 3, CN2-12 intact, strength normal upper extremities and lower extremities, sensation normal throughout, DTRs 2+ throughout, no cerebellar signs, gait normal Psychiatric: normal affect, behavior normal, pleasant   Medicare Attestation I have personally reviewed: The patient's medical and social history Their use of alcohol, tobacco or illicit drugs Their current medications and supplements The patient's functional ability including ADLs,fall risks,  home safety risks, cognitive, and hearing and visual impairment Diet and physical activities Evidence for depression or mood disorders  The patient's weight, height, BMI, and visual acuity have been recorded in the chart.  I have made referrals, counseling, and provided education to the patient based on review of the above and I have provided the patient with a written personalized care plan for preventive services.     Izora Ribas, NP   10/07/2017

## 2017-10-08 ENCOUNTER — Ambulatory Visit: Payer: Self-pay | Admitting: Adult Health

## 2017-10-15 DIAGNOSIS — Z6827 Body mass index (BMI) 27.0-27.9, adult: Secondary | ICD-10-CM | POA: Diagnosis not present

## 2017-10-15 DIAGNOSIS — Z1231 Encounter for screening mammogram for malignant neoplasm of breast: Secondary | ICD-10-CM | POA: Diagnosis not present

## 2017-10-15 DIAGNOSIS — Z124 Encounter for screening for malignant neoplasm of cervix: Secondary | ICD-10-CM | POA: Diagnosis not present

## 2017-10-15 DIAGNOSIS — N958 Other specified menopausal and perimenopausal disorders: Secondary | ICD-10-CM | POA: Diagnosis not present

## 2017-10-17 ENCOUNTER — Ambulatory Visit (INDEPENDENT_AMBULATORY_CARE_PROVIDER_SITE_OTHER): Payer: PPO | Admitting: Adult Health

## 2017-10-17 ENCOUNTER — Encounter: Payer: Self-pay | Admitting: Adult Health

## 2017-10-17 VITALS — BP 138/78 | HR 69 | Ht 66.0 in | Wt 167.0 lb

## 2017-10-17 DIAGNOSIS — F1510 Other stimulant abuse, uncomplicated: Secondary | ICD-10-CM

## 2017-10-17 DIAGNOSIS — Z720 Tobacco use: Secondary | ICD-10-CM | POA: Diagnosis not present

## 2017-10-17 DIAGNOSIS — I10 Essential (primary) hypertension: Secondary | ICD-10-CM

## 2017-10-17 DIAGNOSIS — E7800 Pure hypercholesterolemia, unspecified: Secondary | ICD-10-CM

## 2017-10-17 MED ORDER — AMLODIPINE BESYLATE 2.5 MG PO TABS: 2.5000 mg | ORAL_TABLET | Freq: Two times a day (BID) | ORAL | 5 refills | Status: DC

## 2017-10-17 MED ORDER — EZETIMIBE 10 MG PO TABS: 10.0000 mg | ORAL_TABLET | Freq: Every day | ORAL | 1 refills | Status: DC

## 2017-10-17 MED ORDER — LOSARTAN POTASSIUM 100 MG PO TABS: ORAL_TABLET | ORAL | 1 refills | Status: DC

## 2017-10-17 MED ORDER — BISOPROLOL-HYDROCHLOROTHIAZIDE 10-6.25 MG PO TABS: 0.5000 | ORAL_TABLET | Freq: Every day | ORAL | 5 refills | Status: DC

## 2017-10-17 MED ORDER — NIFEDIPINE ER OSMOTIC RELEASE 60 MG PO TB24
ORAL_TABLET | ORAL | 1 refills | Status: DC
Start: 2017-10-17 — End: 2019-01-02

## 2017-10-17 NOTE — Patient Instructions (Signed)
Medication Instructions:  MAKE SURE TO KEEP TAKING ZETIA  If you need a refill on your cardiac medications before your next appointment, please call your pharmacy.  Special Instructions:  REFER TO PHARMACISTS-REPATHA CONSULT  Follow-Up: Your physician wants you to follow-up in: [place]. You should receive a reminder letter in the mail two months in advance. If you do not receive a letter, please call our office 02-2018 to schedule the 04-2018 follow-up appointment.   Thank you for choosing [facility] at [facility]!!

## 2017-10-17 NOTE — Progress Notes (Signed)
Cardiology Office Note   Date:  10/17/2017   ID:  Sherri SUTLIFF, DOB 1950-12-09, MRN [id number]  PCP:  Unk Pinto, MD  Cardiologist:  [facility]  Chief Complaint  Patient presents with  . Chest Pain  . Hypertension     History of Present Illness: [name] is a 67 y.o. female who presents for ongoing assessment and management of chest pain.  Other history includes hypertension, hypercholesterolemia, COPD, ongoing tobacco abuse diet-controlled diabetes, and hepatitis A the patient was seen last by Dr. Stanford Breed on 03/29/2017.  Chest pain was felt to be atypical however an exercise treadmill for risk stratification was ordered.  A echocardiogram was also ordered to assess LV function and wall motion.  Echocardiogram was normal without wall motion abnormalities, normal LV systolic function with EF of 55% to 60%.  Stress echo revealed hypertensive response but no ischemia on 10/29//2018.  She comes today having stopped taking her statin medication, and Zetia stating that it was causing constipation.  She also states that it made her tongue swell and she was having some skin breakouts after taking it.  I reviewed her most recent labs to include her cholesterol panel dated 07/10/2017.  Total cholesterol 268, HDL 88, LDL 161, triglycerides 83.  She also states that she is been having worsening palpitations but states she believes is related to stress.  The patient drinks 5-6 cups of caffeine a day, and smokes a minimum of 1 pack a day sometimes more.  She does not exercise regularly.  She denies complaints of chest pain but does have some frequent coughing and mild dyspnea.  She is being treated by her PCP for sinus infection.  Past Medical History:  Diagnosis Date  . Allergy    enviromental  . Anxiety   . Arthritis    osteoarthritis  . COPD (chronic obstructive pulmonary disease) (HCC)    mild  . Diabetes mellitus without complication ([place])    controlled by diet  .  Environmental allergies   . Hepatitis    hepatitis A  . Hyperlipidemia   . Hypertension   . Mixed hyperlipidemia 06/15/2013  . PONV (postoperative nausea and vomiting)    second surgery no problems last surgery  . Urgency of urination    Takes oxybutynin; under control at this time    Past Surgical History:  Procedure Laterality Date  . APPENDECTOMY     removed as child  . BACK SURGERY     lumbar fusion  . COLONOSCOPY    . DILATION AND CURETTAGE OF UTERUS    . LUMBAR FUSION  02/06/2012   L 4  L5  . PARTIAL HYSTERECTOMY    . TOTAL HIP ARTHROPLASTY  06/02/2012   Procedure: TOTAL HIP ARTHROPLASTY;  Surgeon: Kerin Salen, MD;  Location: [place];  Service: Orthopedics;  Laterality: Right;  . TOTAL HIP ARTHROPLASTY Left 01/25/2016  . TOTAL HIP ARTHROPLASTY Left 01/25/2016   Procedure: LEFT TOTAL HIP ARTHROPLASTY;  Surgeon: Frederik Pear, MD;  Location: [place];  Service: Orthopedics;  Laterality: Left;  . TOTAL KNEE ARTHROPLASTY Right 12/15/2012   Procedure: TOTAL KNEE ARTHROPLASTY;  Surgeon: Kerin Salen, MD;  Location: [place];  Service: Orthopedics;  Laterality: Right;  . TOTAL KNEE ARTHROPLASTY Left 06/18/2016   Procedure: TOTAL KNEE ARTHROPLASTY;  Surgeon: Frederik Pear, MD;  Location: [place];  Service: Orthopedics;  Laterality: Left;     Current Outpatient Medications  Medication Sig Dispense Refill  . amLODipine (NORVASC) 2.5 MG tablet Take  1 tablet (2.5 mg total) by mouth 2 (two) times daily. 60 tablet 5  . aspirin 81 MG tablet Take 81 mg by mouth daily.    . bisoprolol-hydrochlorothiazide (ZIAC) 10-6.25 MG tablet Take 0.5 tablets by mouth daily. 15 tablet 5  . Cholecalciferol (VITAMIN D3) 5000 UNITS TABS Take 5,000 Units by mouth daily.    . fluticasone (FLONASE) 50 MCG/ACT nasal spray 1 Spray each nostril daily 48 g 1  . losartan (COZAAR) 100 MG tablet Take 1 tablet daily for BP 90 tablet 1  . montelukast (SINGULAIR) 10 MG tablet TAKE 1 TABLET(10 MG) BY MOUTH DAILY 90 tablet 3  .  Multiple Vitamins-Minerals (MULTI COMPLETE PO) Take 1 capsule by mouth daily.    Marland Kitchen NIFEdipine (PROCARDIA XL/ADALAT-CC) 60 MG 24 hr tablet TAKE 1 TABLET BY MOUTH EVERY NIGHT FOR BLOOD PRESSURE 90 tablet 1  . Omega-3 Fatty Acids (FISH OIL) 1000 MG CAPS Take 1 capsule by mouth daily.    Marland Kitchen omeprazole (PRILOSEC) 20 MG capsule TAKE 1 CAPSULE(20 MG) BY MOUTH EVERY DAY 90 capsule 1  . ranitidine (ZANTAC) 300 MG tablet TAKE 1 TABLET BY MOUTH AT NIGHT TIME FOR 2 WEEKS, THEN TAKE AS NEEDED 90 tablet 1  . clonazePAM (KLONOPIN) 0.5 MG tablet TAKE 1/2-1 TABLET BY MOUTH AT BEDTIME FOR SLEEP 90 tablet 1  . ezetimibe (ZETIA) 10 MG tablet Take 1 tablet (10 mg total) by mouth daily. 90 tablet 1   No current facility-administered medications for this visit.     Allergies:   Enalapril and No known allergies    Social History:  The patient  reports that she has been smoking cigarettes.  She has a 7.50 pack-year smoking history. She has never used smokeless tobacco. She reports that she drinks about 8.4 oz of alcohol per week. She reports that she has current or past drug history. Drug: Marijuana.   Family History:  The patient's family history includes CAD in her father; Colon cancer in her mother and sister; Diabetes in her mother; Hypertension in her brother, brother, and father; Lung cancer in her brother.    ROS: All other systems are reviewed and negative. Unless otherwise mentioned in H&P    PHYSICAL EXAM: VS:  BP 138/78   Pulse 69   Ht 5\' 6"  (1.676 m)   Wt 167 lb (75.8 kg)   BMI 26.95 kg/m  , BMI Body mass index is 26.95 kg/m. GEN: Well nourished, well developed, in no acute distress  HEENT: normal  Neck: no JVD, carotid bruits, or masses Cardiac: RRR; no murmurs, rubs, or gallops,no edema  Respiratory:  clear to auscultation bilaterally, normal work of breathing, upper airway congestion. Some coughing.  GI: soft, nontender, nondistended, + BS MS: no deformity or atrophy  Skin: warm and dry, no  rash Neuro:  Strength and sensation are intact Psych: euthymic mood, full affect   Recent Labs: 07/10/2017: ALT 11; BUN 14; Creat 0.69; Hemoglobin 13.0; Magnesium 2.2; Platelets 240; Potassium 4.3; Sodium 140; TSH 0.67    Lipid Panel    Component Value Date/Time   CHOL 268 (H) 07/10/2017 1140   TRIG 83 07/10/2017 1140   HDL 88 07/10/2017 1140   CHOLHDL 3.0 07/10/2017 1140   VLDL 18 12/18/2016 1225   LDLCALC 161 (H) 07/10/2017 1140      Wt Readings from Last 3 Encounters:  10/17/17 167 lb (75.8 kg)  09/25/17 169 lb (76.7 kg)  07/10/17 173 lb 12.8 oz (78.8 kg)  Other studies Reviewed: Echocardiogram 2017/04/10 Left ventricle: The cavity size was normal. Systolic function was   normal. The estimated ejection fraction was in the range of 55%   to 60%. Wall motion was normal; there were no regional wall   motion abnormalities. Left ventricular diastolic function   parameters were normal. - Aortic valve: There was trivial regurgitation. - Left atrium: The atrium was mildly dilated. - Atrial septum: No defect or patent foramen ovale was identified.  Stress Test 04/11/2017   Horizontal ST segment depression ST segment depression of 1 mm was noted during stress in the II, III, aVF, V6 and V5 leads, beginning at 6 minutes of stress. ST depression worsened for the first 5-6 minutes of recovery, then resolved by 9 minutes in recovery   Abnormal ECG stress test, consistent with ischemic response.   Stress Echo 05/13/2017 Impressions:  - Stress echo with no chest pain; hypertensive BP response; no   diagnostic ST changes (increased inferolateral TWI in recovery   felt to be nondiagnostic); no obvious stress-induced wall motion   abnormalities.  ASSESSMENT AND PLAN:  1.  Hypercholesterolemia: The patient is not controlled, stopped taking Crestor, had been on atorvastatin in the past, and Zetia.  He states that these medications cause constipation, her tongue to swell, and  skin eruptions.  I have counseled her on a low-cholesterol diet, reviewed her most recent labs with her and discussed the ranges which are acceptable and unacceptable leading to CAD.  She may have some familial hypercholesterolemia as well.  She is going to be referred to pharmacy to discuss [place].  She is interested in trying this.  She will need follow-up labs once Repatha has been instituted if she does qualify for this.  I have asked her to restart Zetia.  2.  Hypertension: The patient continues on nifedipine, amlodipine, losartan and bisoprolol HCTZ.  Heart rate 69 bpm.  She is on multiple antihypertensives.  I advised her on a low-sodium diet and exercise to help to reduce blood pressure.  If blood pressure medications can be weaned it would be best as she does have polypharmacy related to antihypertensive medications.  I am concerned that she is both on nifedipine and amlodipine.  3.  Ongoing tobacco abuse: The patient has been counseled on smoking cessation.  She states she has tried Chantix, and Wellbutrin, and was unable to tolerate these medications.  She continues to smoke a minimum of 1 pack a day.  I am referring her to our health coach to assist in smoking cessation.  4.  Excessive caffeine use, This may be what is actually causing her constipation versus the statin therapy.  Have asked her to increase her non-caffeinated fluid intake to include more water.  I have asked her to begin weaning from caffeine.  She is willing to change her coffee to decaf to begin.   Current medicines are reviewed at length with the patient today.    Labs/ tests ordered today include Referral to Health Coach. Referral to Pharm D for lipid management.   Phill Myron. West Pugh, ANP, AACC   10/17/2017 8:22 AM    [facility] [address], [place], [place] [address] Phone: [phone]; Fax: [phone]

## 2017-10-21 DIAGNOSIS — Z0000 Encounter for general adult medical examination without abnormal findings: Principal | ICD-10-CM

## 2017-10-21 NOTE — Progress Notes (Signed)
MEDICARE ANNUAL WELLNESS VISIT AND FOLLOW UP  Assessment:   Diagnoses and all orders for this visit:  Encounter for Medicare annual wellness exam  Essential hypertension Continue medications Monitor blood pressure at home; call if consistently over 130/80 Continue DASH diet.   Reminder to go to the ER if any CP, SOB, nausea, dizziness, severe HA, changes vision/speech, left arm numbness and tingling and jaw pain.  Aortic atherosclerosis by Abd CT scan [name], cholesterol, glucose, increase exercise.   Gastroesophageal reflux disease, esophagitis presence not specified ? Poorly managed vs alternate etiology for cough/hoarseness, continue with ranitidine 300 mg which she just restarted, omeprazole 20 mg at this time Discussed diet, avoiding triggers and other lifestyle changes  Vitamin D deficiency Continue supplementation for goal 70-100 Check vitamin D level  Other abnormal glucose Recent A1Cs at goal Discussed diet/exercise, weight management  Defer A1C; check BMP  Medication management CBC, BMP/GFR, LFTs  Hyperlipidemia, mixed Continue medications: zetia 10 mg daily just restarted - followed by pharm D at cardiology, discussing novel agents due to poor tolerance of statins Continue low cholesterol diet and exercise.  Check lipid panel, TSH   Need for pneumonia vaccination Prevnar 13 administered today  Sensation of lump in throat; hoarseness Smoker, ongoing hoarseness, sensation of lump in her throat, not improved by treating GERD/seasonal allergies, ongoing now for 1+ year Will refer to ENT for evaluation after discussion with patient  Smoker Discussed risks associated with tobacco use and advised to reduce or quit Patient is ready to do so and plans to cut down gradually, will restart low dose wellbutrin 150 mg as this has been helpful in the past Declines Chantix Will follow up at the next visit   Over 40 minutes of exam, counseling,  chart review and critical decision making was performed Future Appointments  Date Time Provider [place]  11/14/2017  2:30 PM [facility] PH[facility]  01/08/2018  9:00 AM Unk Pinto, MD [facility] None     Plan:   During the course of the visit the patient was educated and counseled about appropriate screening and preventive services including:    Pneumococcal vaccine   Prevnar 13  Influenza vaccine  Td vaccine  Screening electrocardiogram  Bone densitometry screening  Colorectal cancer screening  Diabetes screening  Glaucoma screening  Nutrition counseling   Advanced directives: requested   Subjective:  [name] is a 67 y.o. female who presents for Medicare Annual Wellness Visit and 3 month follow up.   she has a diagnosis of anxiety and is currently on clonazepam 0.25 - 0.5 mg, reports symptoms are fairly controlled on current regimen. she reports use is highly variable, has been more stressed recently and has been taking 1/2 tab daily.   she has a diagnosis of GERD which is currently managed by ranitidine 300 mg at night, prilosec 20 mg daily in AM.  she reports symptoms is not currently well controlled; she reports ongoing hoarseness, burning in her throat/mouth, has sensation of lump in her throat, "tightness" with attempts to swallow, nagging cough. She is a smoker, currently smoking 3/4 pack daily. She reports ongoing hoarseness that is worse over the course of several years. She is trying to cut down on smoking.   BMI is Body mass index is 27.12 kg/m., she has been working on diet and exercise. Wt Readings from Last 3 Encounters:  10/22/17 168 lb (76.2 kg)  10/17/17 167 lb (75.8 kg)  09/25/17 169 lb (76.7 kg)  Her blood pressure has been controlled at home, today their BP is BP: 138/68 She does not workout. She denies chest pain, shortness of breath, dizziness.   She is on cholesterol medication (zetia 10 mg daily,  restarted recently by cardiology, pending discussion with pharm D regarding novel agents) and denies myalgias. Her cholesterol is not at goal. The cholesterol last visit was:   Lab Results  Component Value Date   CHOL 268 (H) 07/10/2017   HDL 88 07/10/2017   LDLCALC 161 (H) 07/10/2017   TRIG 83 07/10/2017   CHOLHDL 3.0 07/10/2017    She has been working on diet and exercise for glucose management, and denies foot ulcerations, increased appetite, nausea, paresthesia of the feet, polydipsia, polyuria, visual disturbances, vomiting and weight loss. Last A1C in the office was:  Lab Results  Component Value Date   HGBA1C 5.5 07/10/2017   Last GFR: Lab Results  Component Value Date   GFRNONAA 91 07/10/2017   Patient is on Vitamin D supplement but remained below goal of 70 at most recent check:   Lab Results  Component Value Date   VD25OH 48 07/10/2017      Medication Review: Current Outpatient Medications on File Prior to Visit  Medication Sig Dispense Refill  . amLODipine (NORVASC) 2.5 MG tablet Take 1 tablet (2.5 mg total) by mouth 2 (two) times daily. 60 tablet 5  . aspirin 81 MG tablet Take 81 mg by mouth daily.    . bisoprolol-hydrochlorothiazide (ZIAC) 10-6.25 MG tablet Take 0.5 tablets by mouth daily. 15 tablet 5  . Cholecalciferol (VITAMIN D3) 5000 UNITS TABS Take 5,000 Units by mouth daily.    Marland Kitchen ezetimibe (ZETIA) 10 MG tablet Take 1 tablet (10 mg total) by mouth daily. 90 tablet 1  . fluticasone (FLONASE) 50 MCG/ACT nasal spray 1 Spray each nostril daily 48 g 1  . losartan (COZAAR) 100 MG tablet Take 1 tablet daily for BP 90 tablet 1  . montelukast (SINGULAIR) 10 MG tablet TAKE 1 TABLET(10 MG) BY MOUTH DAILY 90 tablet 3  . Multiple Vitamins-Minerals (MULTI COMPLETE PO) Take 1 capsule by mouth daily.    Marland Kitchen NIFEdipine (PROCARDIA XL/ADALAT-CC) 60 MG 24 hr tablet TAKE 1 TABLET BY MOUTH EVERY NIGHT FOR BLOOD PRESSURE 90 tablet 1  . Omega-3 Fatty Acids (FISH OIL) 1000 MG CAPS Take 1  capsule by mouth daily.    Marland Kitchen omeprazole (PRILOSEC) 20 MG capsule TAKE 1 CAPSULE(20 MG) BY MOUTH EVERY DAY 90 capsule 1  . ranitidine (ZANTAC) 300 MG tablet TAKE 1 TABLET BY MOUTH AT NIGHT TIME FOR 2 WEEKS, THEN TAKE AS NEEDED 90 tablet 1  . clonazePAM (KLONOPIN) 0.5 MG tablet TAKE 1/2-1 TABLET BY MOUTH AT BEDTIME FOR SLEEP 90 tablet 1   No current facility-administered medications on file prior to visit.     Allergies  Allergen Reactions  . Enalapril Swelling    Caused lips to swell and bloating.  Marland Kitchen No Known Allergies     Current Problems (verified) Patient Active Problem List   Diagnosis Date Noted  . Anxiety 10/22/2017  . Current smoker 10/22/2017  . Encounter for Medicare annual wellness exam 10/21/2017  . Aortic atherosclerosis by Abd CT scan ([place]) 05/16/2017  . GERD (gastroesophageal reflux disease) 04/10/2017  . Other abnormal glucose 03/20/2014  . Medication management 03/20/2014  . Essential hypertension 06/15/2013  . Hyperlipidemia, mixed 06/15/2013  . Vitamin D deficiency 06/15/2013    Screening Tests Immunization History  Administered Date(s) Administered  . Influenza,  High Dose Seasonal PF 04/10/2017  . PPD Test 09/21/2014  . Pneumococcal Conjugate-13 10/22/2017  . Pneumococcal Polysaccharide-23 02/07/2011, 01/26/2016  . Tdap 02/07/2011   Preventative care: Last colonoscopy: 2015 Mammogram:  05/2017, at Obgyn Pelvic exam: at [facility], sees [facility], had pelvic this week Dexa: obgyn has been handling  Prior vaccinations: TD or Tdap: 2012  Influenza: 2018  Pneumococcal: 2012, 2017 Prevnar13: DUE  Shingles/Zostavax: will check with insurance  Names of Other Physician/Practitioners you currently use: 1. [facility] here for primary care 2. [facility], eye doctor, last visit 2018 3. Dr. Purcell [facility], dentist, last visit 2019  Patient Care Team: Unk Pinto, MD as PCP - General (Internal  Medicine)  SURGICAL HISTORY She  has a past surgical history that includes Appendectomy; Partial hysterectomy; Lumbar fusion (02/06/2012); Back surgery; Dilation and curettage of uterus; Total hip arthroplasty (06/02/2012); Colonoscopy; Total knee arthroplasty (Right, 12/15/2012); Total hip arthroplasty (Left, 01/25/2016); Total hip arthroplasty (Left, 01/25/2016); and Total knee arthroplasty (Left, 06/18/2016). FAMILY HISTORY Her family history includes CAD in her father; Colon cancer in her mother and sister; Diabetes in her mother; Hypertension in her brother, brother, and father; Lung cancer in her brother. SOCIAL HISTORY She  reports that she has been smoking cigarettes.  She started smoking about 34 years ago. She has a 33.00 pack-year smoking history. She has never used smokeless tobacco. She reports that she drinks about 8.4 oz of alcohol per week. She reports that she has current or past drug history. Drug: Marijuana.   MEDICARE WELLNESS OBJECTIVES: Physical activity: Current Exercise Habits: The patient does not participate in regular exercise at present(Has new Gym membership, plans to start this week, will walk and do water aerobics), Exercise limited by: None identified Cardiac risk factors: Cardiac Risk Factors include: advanced age (>36men, >71 women);dyslipidemia;hypertension;sedentary lifestyle;smoking/ tobacco exposure Depression/mood screen:   Depression screen [facility] 2/9 10/22/2017  Decreased Interest 0  Down, Depressed, Hopeless 0  PHQ - 2 Score 0    ADLs:  In your present state of health, do you have any difficulty performing the following activities: 10/22/2017 07/10/2017  Hearing? N N  Vision? N N  Difficulty concentrating or making decisions? N N  Walking or climbing stairs? N N  Dressing or bathing? N N  Doing errands, shopping? N N  Some recent data might be hidden     Cognitive Testing  Alert? Yes  Normal Appearance?Yes  Oriented to person? Yes  Place? Yes   Time?  Yes  Recall of three objects?  Yes  Can perform simple calculations? Yes  Displays appropriate judgment?Yes  Can read the correct time from a watch face?Yes  EOL planning: Does Patient Have a Medical Advance Directive?: No Would patient like information on creating a medical advance directive?: Yes (MAU/Ambulatory/Procedural Areas - Information given)  Review of Systems  Constitutional: Negative for malaise/fatigue and weight loss.  HENT: Positive for congestion and sore throat. Negative for hearing loss and tinnitus.   Eyes: Negative for blurred vision and double vision.  Respiratory: Positive for cough and sputum production. Negative for shortness of breath and wheezing.   Cardiovascular: Negative for chest pain, palpitations, orthopnea, claudication, leg swelling and PND.  Gastrointestinal: Negative for abdominal pain, blood in stool, constipation, diarrhea, heartburn, melena, nausea and vomiting.  Genitourinary: Negative.   Musculoskeletal: Negative for falls, joint pain and myalgias.  Skin: Negative for rash.  Neurological: Negative for dizziness, tingling, sensory change, weakness and headaches.  Endo/Heme/Allergies: Negative for environmental allergies  and polydipsia.  Psychiatric/Behavioral: Negative.  Negative for depression, memory loss, substance abuse and suicidal ideas. The patient is not nervous/anxious and does not have insomnia.   All other systems reviewed and are negative.    Objective:     Today's Vitals   10/22/17 1459  BP: 138/68  Pulse: 67  Temp: (!) 97.5 F (36.4 C)  SpO2: 95%  Weight: 168 lb (76.2 kg)  Height: 5\' 6"  (1.676 m)  PainSc: 3   PainLoc: Throat   Body mass index is 27.12 kg/m.  General appearance: alert, no distress, WD/WN, female HEENT: normocephalic, sclerae anicteric, TMs pearly, nares patent, no discharge or erythema, pharynx normal Oral cavity: MMM, no lesions Neck: supple, no lymphadenopathy, no thyromegaly, no masses Heart: RRR,  normal S1, S2, no murmurs Lungs: Scattered rhonchi to bilateral bases without wheezes or rales Abdomen: +bs, soft, non tender, non distended, no masses, no hepatomegaly, no splenomegaly Musculoskeletal: nontender, no swelling, no obvious deformity Extremities: no edema, no cyanosis, no clubbing Pulses: 2+ symmetric, upper and lower extremities, normal cap refill Neurological: alert, oriented x 3, CN2-12 intact, strength normal upper extremities and lower extremities, sensation normal throughout, DTRs 2+ throughout, no cerebellar signs, gait normal Psychiatric: normal affect, behavior normal, pleasant   Medicare Attestation I have personally reviewed: The patient's medical and social history Their use of alcohol, tobacco or illicit drugs Their current medications and supplements The patient's functional ability including ADLs,fall risks, home safety risks, cognitive, and hearing and visual impairment Diet and physical activities Evidence for depression or mood disorders  The patient's weight, height, BMI, and visual acuity have been recorded in the chart.  I have made referrals, counseling, and provided education to the patient based on review of the above and I have provided the patient with a written personalized care plan for preventive services.     Izora Ribas, NP   10/22/2017

## 2017-10-22 ENCOUNTER — Encounter: Payer: Self-pay | Admitting: Adult Health

## 2017-10-22 ENCOUNTER — Ambulatory Visit: Payer: Self-pay | Admitting: Adult Health

## 2017-10-22 ENCOUNTER — Ambulatory Visit (INDEPENDENT_AMBULATORY_CARE_PROVIDER_SITE_OTHER): Payer: PPO | Admitting: Adult Health

## 2017-10-22 VITALS — BP 138/68 | HR 67 | Temp 97.5°F | Ht 66.0 in | Wt 168.0 lb

## 2017-10-22 DIAGNOSIS — E559 Vitamin D deficiency, unspecified: Secondary | ICD-10-CM | POA: Diagnosis not present

## 2017-10-22 DIAGNOSIS — R0989 Other specified symptoms and signs involving the circulatory and respiratory systems: Secondary | ICD-10-CM

## 2017-10-22 DIAGNOSIS — E782 Mixed hyperlipidemia: Secondary | ICD-10-CM | POA: Diagnosis not present

## 2017-10-22 DIAGNOSIS — Z0001 Encounter for general adult medical examination with abnormal findings: Secondary | ICD-10-CM | POA: Diagnosis not present

## 2017-10-22 DIAGNOSIS — Z0000 Encounter for general adult medical examination without abnormal findings: Secondary | ICD-10-CM

## 2017-10-22 DIAGNOSIS — K219 Gastro-esophageal reflux disease without esophagitis: Secondary | ICD-10-CM | POA: Diagnosis not present

## 2017-10-22 DIAGNOSIS — R490 Dysphonia: Secondary | ICD-10-CM

## 2017-10-22 DIAGNOSIS — F419 Anxiety disorder, unspecified: Secondary | ICD-10-CM | POA: Diagnosis not present

## 2017-10-22 DIAGNOSIS — R6889 Other general symptoms and signs: Secondary | ICD-10-CM | POA: Diagnosis not present

## 2017-10-22 DIAGNOSIS — F330 Major depressive disorder, recurrent, mild: Secondary | ICD-10-CM | POA: Insufficient documentation

## 2017-10-22 DIAGNOSIS — Z23 Encounter for immunization: Secondary | ICD-10-CM

## 2017-10-22 DIAGNOSIS — F17200 Nicotine dependence, unspecified, uncomplicated: Secondary | ICD-10-CM

## 2017-10-22 DIAGNOSIS — I10 Essential (primary) hypertension: Secondary | ICD-10-CM

## 2017-10-22 DIAGNOSIS — Z87891 Personal history of nicotine dependence: Secondary | ICD-10-CM | POA: Insufficient documentation

## 2017-10-22 DIAGNOSIS — R09A2 Foreign body sensation, throat: Secondary | ICD-10-CM

## 2017-10-22 DIAGNOSIS — R7309 Other abnormal glucose: Secondary | ICD-10-CM | POA: Diagnosis not present

## 2017-10-22 DIAGNOSIS — Z79899 Other long term (current) drug therapy: Secondary | ICD-10-CM | POA: Diagnosis not present

## 2017-10-22 DIAGNOSIS — R221 Localized swelling, mass and lump, neck: Secondary | ICD-10-CM

## 2017-10-22 DIAGNOSIS — I700 Atherosclerosis of aorta: Secondary | ICD-10-CM

## 2017-10-22 MED ORDER — BUPROPION HCL ER (XL) 150 MG PO TB24: 150.0000 mg | ORAL_TABLET | ORAL | 1 refills | Status: DC

## 2017-10-22 NOTE — Patient Instructions (Signed)
Globus Pharyngeus Globus pharyngeus is a condition that makes it feel like you have a lump in your throat. It may also feel like you have something stuck in the front of your throat. This feeling may come and go. It is not painful, and it does not make it harder to swallow food or liquid. Globus pharyngeus does not cause changes that a health care provider can see during a physical exam. This condition usually goes away without treatment. What are the causes? Often, no cause can be found. The most common cause of globus pharyngeus is a condition that causes stomach juices to flow back up into the throat (gastroesophageal reflux). Other possible causes include:  Overstimulation of nerves that control swallowing.  Irritation of nerves that control swallowing (neuralgia).  An enlarged gland in the lower neck (thyroid gland).  Growth of tonsil tissue at the base of the tongue (lingual tonsil).  Anxiety.  Depression.  What are the signs or symptoms? The main symptom of this condition is a feeling of a lump in your throat. This feeling usually comes and goes. How is this diagnosed? This condition may be diagnosed after other conditions have been ruled out. You may have tests, such as:  A swallow study.  Ear, nose, and throat evaluation.  An exam of your throat using a thin, flexible tube with a light and camera on the end (endoscopy).  How is this treated? This condition may go away on its own, without treatment. In some cases, antidepressant medicines may be helpful. Follow these instructions at home:  Follow instructions from your health care provider about eating or drinking restrictions.  Take over-the-counter and prescription medicines only as told by your health care provider.  Keep all follow-up visits as told by your health care provider. This is important.  Follow instructions from your health care provider about home care for gastroesophageal reflux. Your health care  provider may recommend that you: ? Do not eat or drink anything that causes heartburn. ? Do not eat heavy meals close to bedtime. ? Do not drink caffeine. ? Do not drink alcohol. ? Raise the head of your bed. ? Sleep on your left side. Contact a health care provider if:  Your symptoms get worse.  You have throat pain.  You have trouble swallowing.  Food or liquid comes back up into your mouth.  You lose weight without trying. Get help right away if:  You develop swelling in your throat. Summary  Globus pharyngeus is a condition that makes it feel like you have a lump in your throat.  This condition usually goes away without treatment. This information is not intended to replace advice given to you by your health care provider. Make sure you discuss any questions you have with your health care provider. Document Released: 03/07/2016 Document Revised: 03/07/2016 Document Reviewed: 03/07/2016 Elsevier Interactive Patient Education  2018 Elsevier Inc.  

## 2017-10-23 LAB — CBC WITH DIFFERENTIAL/PLATELET
Basophils Absolute: 50 cells/uL (ref 0–200)
Basophils Relative: 0.9 %
EOS ABS: 341 {cells}/uL (ref 15–500)
Eosinophils Relative: 6.2 %
HCT: 40.1 % (ref 35.0–45.0)
Hemoglobin: 13.5 g/dL (ref 11.7–15.5)
Lymphs Abs: 2624 cells/uL (ref 850–3900)
MCH: 26.7 pg — AB (ref 27.0–33.0)
MCHC: 33.7 g/dL (ref 32.0–36.0)
MCV: 79.2 fL — AB (ref 80.0–100.0)
MPV: 10.7 fL (ref 7.5–12.5)
Monocytes Relative: 8.7 %
NEUTROS PCT: 36.5 %
Neutro Abs: 2008 cells/uL (ref 1500–7800)
PLATELETS: 240 10*3/uL (ref 140–400)
RBC: 5.06 10*6/uL (ref 3.80–5.10)
RDW: 14.5 % (ref 11.0–15.0)
TOTAL LYMPHOCYTE: 47.7 %
WBC: 5.5 10*3/uL (ref 3.8–10.8)
WBCMIX: 479 {cells}/uL (ref 200–950)

## 2017-10-23 LAB — LIPID PANEL
CHOL/HDL RATIO: 2.9 (calc) (ref <5.0)
Cholesterol: 252 mg/dL — ABNORMAL HIGH (ref <200)
HDL: 86 mg/dL (ref >50)
LDL Cholesterol (Calc): 141 mg/dL (calc) — ABNORMAL HIGH
NON-HDL CHOLESTEROL (CALC): 166 mg/dL — AB (ref <130)
Triglycerides: 130 mg/dL (ref <150)

## 2017-10-23 LAB — BASIC METABOLIC PANEL WITH GFR
BUN: 16 mg/dL (ref 7–25)
CO2: 32 mmol/L (ref 20–32)
CREATININE: 0.88 mg/dL (ref 0.50–0.99)
Calcium: 9.4 mg/dL (ref 8.6–10.4)
Chloride: 104 mmol/L (ref 98–110)
GFR, EST NON AFRICAN AMERICAN: 68 mL/min/{1.73_m2} (ref >=60)
GFR, Est African American: 79 mL/min/{1.73_m2} (ref >=60)
Glucose, Bld: 87 mg/dL (ref 65–99)
Potassium: 4.9 mmol/L (ref 3.5–5.3)
SODIUM: 141 mmol/L (ref 135–146)

## 2017-10-23 LAB — HEPATIC FUNCTION PANEL
AG RATIO: 2 (calc) (ref 1.0–2.5)
ALT: 10 U/L (ref 6–29)
AST: 19 U/L (ref 10–35)
Albumin: 4.4 g/dL (ref 3.6–5.1)
Alkaline phosphatase (APISO): 72 U/L (ref 33–130)
BILIRUBIN DIRECT: 0.1 mg/dL (ref 0.0–0.2)
BILIRUBIN INDIRECT: 0.3 mg/dL (ref 0.2–1.2)
GLOBULIN: 2.2 g/dL (ref 1.9–3.7)
Total Bilirubin: 0.4 mg/dL (ref 0.2–1.2)
Total Protein: 6.6 g/dL (ref 6.1–8.1)

## 2017-10-23 LAB — MAGNESIUM: MAGNESIUM: 2 mg/dL (ref 1.5–2.5)

## 2017-10-23 LAB — TSH: TSH: 0.68 mIU/L (ref 0.40–4.50)

## 2017-10-23 LAB — VITAMIN D 25 HYDROXY (VIT D DEFICIENCY, FRACTURES): Vit D, 25-Hydroxy: 92 ng/mL (ref 30–100)

## 2017-10-29 ENCOUNTER — Telehealth: Payer: Self-pay | Admitting: Adult Health

## 2017-10-29 NOTE — Telephone Encounter (Signed)
Spoke with pt, okay given for her to stop zetia for 2 weeks. Patient voiced understanding if the symptoms gop away, to stay off zetia. If the symptoms do not improve, she will restart zetia.

## 2017-10-29 NOTE — Telephone Encounter (Signed)
Returned call to pt. She states she has been experiencing some minor cramping in her legs that has progressively gotten worse over the last 4 days. Pt concerned that it could be due to her Zetia Routed to Dr. Stanford Breed and primary nurse.

## 2017-10-29 NOTE — Telephone Encounter (Signed)
New Message   Pt c/o medication issue:  1. Name of Medication: ezetimibe (ZETIA) 10 MG tablet  2. How are you currently taking this medication (dosage and times per day)? 10mg  tablet  3. Are you having a reaction (difficulty breathing--STAT)? none  4. What is your medication issue? Patient states that the medication is causing her to have severe muscle pain. Please call

## 2017-10-30 DIAGNOSIS — K219 Gastro-esophageal reflux disease without esophagitis: Secondary | ICD-10-CM | POA: Diagnosis not present

## 2017-10-30 DIAGNOSIS — R490 Dysphonia: Secondary | ICD-10-CM | POA: Diagnosis not present

## 2017-11-07 ENCOUNTER — Telehealth: Payer: Self-pay | Admitting: Adult Health

## 2017-11-07 NOTE — Telephone Encounter (Signed)
Called to schedule initial apt, scheduled for 4/30 at 11:00 am

## 2017-11-11 ENCOUNTER — Telehealth: Payer: Self-pay | Admitting: Cardiology

## 2017-11-11 NOTE — Telephone Encounter (Signed)
New Message:   Pt cancel 11-12-17 appointment,she wants to reschedule.

## 2017-11-12 ENCOUNTER — Ambulatory Visit: Payer: PPO

## 2017-11-14 ENCOUNTER — Ambulatory Visit: Payer: PPO

## 2017-11-14 NOTE — Progress Notes (Deleted)
Patient ID: ALMEE PELPHREY                 DOB: 05-28-1951                    MRN: [id number]     HPI: [name] is a 67 y.o. female patient of Dr Stanford Breed referred to lipid clinic by Jory Sims NP. PMH is significant for chest pain, COPD, hypertension, hyperlipidemia, pre-diabetes, and tobacco abuse.  Current Medications: Ezetimibe 10mg  daily Omega-3 fatty acids 1G daily   Intolerances:  Atorvastatin 10mg  daily  Atorvastatin 80mg  daily Welchol 3.8gm daily Rosuvastatin 20mg  daily  Diet:   Exercise:   Family History: The patient's family history includes CAD in her father; Colon cancer in her mother and sister; Diabetes in her mother; Hypertension in her brother, brother, and father; Lung cancer in her brother  Social History: She is a current smoker. She has never used smokeless tobacco. She reports that she drinks about 8.4 oz of alcohol per week. She reports that she has current or past drug history. Drug: Marijuana.   Labs:  Past Medical History:  Diagnosis Date  . Allergy    enviromental  . Anxiety   . Arthritis    osteoarthritis  . COPD (chronic obstructive pulmonary disease) (HCC)    mild  . Diabetes mellitus without complication ([place])    controlled by diet  . Environmental allergies   . Hepatitis    hepatitis A  . Hyperlipidemia   . Hypertension   . Mixed hyperlipidemia 06/15/2013  . PONV (postoperative nausea and vomiting)    second surgery no problems last surgery  . Urgency of urination    Takes oxybutynin; under control at this time    Current Outpatient Medications on File Prior to Visit  Medication Sig Dispense Refill  . amLODipine (NORVASC) 2.5 MG tablet Take 1 tablet (2.5 mg total) by mouth 2 (two) times daily. 60 tablet 5  . aspirin 81 MG tablet Take 81 mg by mouth daily.    . bisoprolol-hydrochlorothiazide (ZIAC) 10-6.25 MG tablet Take 0.5 tablets by mouth daily. 15 tablet 5  . buPROPion (WELLBUTRIN XL) 150 MG 24 hr tablet Take 1  tablet (150 mg total) by mouth every morning. 90 tablet 1  . Cholecalciferol (VITAMIN D3) 5000 UNITS TABS Take 5,000 Units by mouth daily.    . clonazePAM (KLONOPIN) 0.5 MG tablet TAKE 1/2-1 TABLET BY MOUTH AT BEDTIME FOR SLEEP 90 tablet 1  . ezetimibe (ZETIA) 10 MG tablet Take 1 tablet (10 mg total) by mouth daily. 90 tablet 1  . fluticasone (FLONASE) 50 MCG/ACT nasal spray 1 Spray each nostril daily 48 g 1  . losartan (COZAAR) 100 MG tablet Take 1 tablet daily for BP 90 tablet 1  . montelukast (SINGULAIR) 10 MG tablet TAKE 1 TABLET(10 MG) BY MOUTH DAILY 90 tablet 3  . Multiple Vitamins-Minerals (MULTI COMPLETE PO) Take 1 capsule by mouth daily.    Marland Kitchen NIFEdipine (PROCARDIA XL/ADALAT-CC) 60 MG 24 hr tablet TAKE 1 TABLET BY MOUTH EVERY NIGHT FOR BLOOD PRESSURE 90 tablet 1  . Omega-3 Fatty Acids (FISH OIL) 1000 MG CAPS Take 1 capsule by mouth daily.    Marland Kitchen omeprazole (PRILOSEC) 20 MG capsule TAKE 1 CAPSULE(20 MG) BY MOUTH EVERY DAY 90 capsule 1  . ranitidine (ZANTAC) 300 MG tablet TAKE 1 TABLET BY MOUTH AT NIGHT TIME FOR 2 WEEKS, THEN TAKE AS NEEDED 90 tablet 1   No current facility-administered medications on  file prior to visit.     Allergies  Allergen Reactions  . Enalapril Swelling    Caused lips to swell and bloating.  Marland Kitchen No Known Allergies     No problem-specific Assessment & Plan notes found for this encounter.    Faiz Weber Rodriguez-Guzman PharmD, BCPS, [place] [id number] 11/14/2017 12:47 PM

## 2017-11-15 NOTE — Telephone Encounter (Signed)
Rescheduled pt apt for 5/6 at 11:00 am

## 2017-11-18 ENCOUNTER — Ambulatory Visit (HOSPITAL_COMMUNITY)
Admission: RE | Admit: 2017-11-18 | Discharge: 2017-11-18 | Disposition: A | Discharge status: Home / Self Care | Payer: PPO | Source: Ambulatory Visit | Attending: Adult Health | Admitting: Adult Health

## 2017-11-18 ENCOUNTER — Ambulatory Visit (INDEPENDENT_AMBULATORY_CARE_PROVIDER_SITE_OTHER): Payer: Self-pay

## 2017-11-18 DIAGNOSIS — M5134 Other intervertebral disc degeneration, thoracic region: Secondary | ICD-10-CM | POA: Insufficient documentation

## 2017-11-18 DIAGNOSIS — J449 Chronic obstructive pulmonary disease, unspecified: Secondary | ICD-10-CM | POA: Diagnosis not present

## 2017-11-18 DIAGNOSIS — R0989 Other specified symptoms and signs involving the circulatory and respiratory systems: Secondary | ICD-10-CM

## 2017-11-18 DIAGNOSIS — F17200 Nicotine dependence, unspecified, uncomplicated: Secondary | ICD-10-CM | POA: Insufficient documentation

## 2017-11-18 DIAGNOSIS — M419 Scoliosis, unspecified: Secondary | ICD-10-CM | POA: Diagnosis not present

## 2017-11-18 DIAGNOSIS — J9811 Atelectasis: Secondary | ICD-10-CM | POA: Insufficient documentation

## 2017-11-18 DIAGNOSIS — R079 Chest pain, unspecified: Secondary | ICD-10-CM | POA: Diagnosis not present

## 2017-11-18 DIAGNOSIS — I10 Essential (primary) hypertension: Secondary | ICD-10-CM | POA: Diagnosis not present

## 2017-11-18 DIAGNOSIS — R05 Cough: Secondary | ICD-10-CM | POA: Diagnosis not present

## 2017-11-18 DIAGNOSIS — Z0000 Encounter for general adult medical examination without abnormal findings: Secondary | ICD-10-CM

## 2017-11-18 NOTE — Progress Notes (Signed)
Week: 1  Progress Notes: Pt says she has been smoking for over 30 years. Has only quit once, whenever she was pregnant with her daughter. She smokes around a pack a day due to her not working. 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   Challenges: Her husband also smokes, has no desire to quit. Her biggest triggers are with her cup of coffee in the morning and with her beer in the evening.   Opportunities: Would like to be more physically active. Says that she is becoming for health conscious and would like to go to a water aerobics class at the [facility], is currently a member. Says she needs someone to hold her accountable.   Client Commitment/Agreement for Next Session: After going through patient's daily schedule, patient agreed to get rid of her bedtime cigarette as well as start smoking outside. Believes this will set a barrier for her smoking. Provided pt with smoking cessation information as well as a log for her cigarette use. Care Guide agreed to provide more information on [employer] class by next session as well as formulate some educational materials for cravings. Next apt scheduled for 5/20 at 11:00 am

## 2017-11-19 ENCOUNTER — Other Ambulatory Visit: Payer: Self-pay | Admitting: Adult Health

## 2017-11-19 DIAGNOSIS — J449 Chronic obstructive pulmonary disease, unspecified: Secondary | ICD-10-CM

## 2017-11-19 MED ORDER — BUDESONIDE-FORMOTEROL FUMARATE 160-4.5 MCG/ACT IN AERO: 2.0000 | INHALATION_SPRAY | Freq: Two times a day (BID) | RESPIRATORY_TRACT | 12 refills | Status: DC

## 2017-11-26 ENCOUNTER — Ambulatory Visit (INDEPENDENT_AMBULATORY_CARE_PROVIDER_SITE_OTHER): Payer: PPO | Admitting: Pharmacist

## 2017-11-26 ENCOUNTER — Other Ambulatory Visit: Payer: Self-pay | Admitting: Cardiology

## 2017-11-26 VITALS — BP 140/58

## 2017-11-26 DIAGNOSIS — E782 Mixed hyperlipidemia: Secondary | ICD-10-CM

## 2017-11-26 DIAGNOSIS — I10 Essential (primary) hypertension: Secondary | ICD-10-CM

## 2017-11-26 MED ORDER — ROSUVASTATIN CALCIUM 5 MG PO TABS: ORAL_TABLET | ORAL | 0 refills | Status: DC

## 2017-11-26 NOTE — Progress Notes (Signed)
Patient ID: [name]                 DOB: 1951-01-17                      MRN: [id number]     HPI: [name] is a 67 y.o. female patient of Dr. Stanford Breed referred to pharmacist clinic by K. Lawrence NP.  PMH includes tobacco abuse, hypertension, hyperlipidemia, diabetes, hepatitis A, and COPD.  Noted patient intolerance to multiple medication including ACEI , multiple statins, and ezetimibe.   Current HTN meds:  Amlodipine 2.5mg  daily Bisoprolol/HCTZ 10-6.25mg  1/2 tablet daily Losartan 100mg  daily nifedipine XL 60mg  daily  Current Lipid management medication: none  Intolerance for hypertension:  Enalapril - lips swelling  Intolerance for lipid management: Rosuvastatin 20mg  - severe constipation Atorvastatin 10mg  - tingling on her toes Atorvastatin 40mg  - pain and tingling on feet, cramps Welchol 3.75gm - tried in 2013 - patient can not remember reason to stop Ezetimibe 10mg  daily - rash legs and face and worsening constipation  BP goal: <130/80  LDL goal: <4ml/dL  Family History: Family history includes CAD in her father; Colon cancer in her mother and sister; Diabetes in her mother; Hypertension in her brother, brother, and father; Lung cancer in her brother.  Social History: Reports that she has been smoking cigarettes.  She has never used smokeless tobacco. She reports that she drinks about 8.4 oz of alcohol per week. She reports that she has current or past drug history. Drug: Marijuana.   Diet: lowe fat and low sodium as possible  Home BP readings: none avialble  Wt Readings from Last 3 Encounters:  10/22/17 168 lb (76.2 kg)  10/17/17 167 lb (75.8 kg)  09/25/17 169 lb (76.7 kg)   BP Readings from Last 3 Encounters:  11/26/17 (!) 140/58  10/22/17 138/68  10/17/17 138/78   Pulse Readings from Last 3 Encounters:  10/22/17 67  10/17/17 69  09/25/17 100    Past Medical History:  Diagnosis Date  . Allergy    enviromental  . Anxiety   . Arthritis     osteoarthritis  . COPD (chronic obstructive pulmonary disease) (HCC)    mild  . Diabetes mellitus without complication ([place])    controlled by diet  . Environmental allergies   . Hepatitis    hepatitis A  . Hyperlipidemia   . Hypertension   . Mixed hyperlipidemia 06/15/2013  . PONV (postoperative nausea and vomiting)    second surgery no problems last surgery  . Urgency of urination    Takes oxybutynin; under control at this time    Current Outpatient Medications on File Prior to Visit  Medication Sig Dispense Refill  . amLODipine (NORVASC) 2.5 MG tablet Take 1 tablet (2.5 mg total) by mouth 2 (two) times daily. 60 tablet 5  . aspirin 81 MG tablet Take 81 mg by mouth daily.    . bisoprolol-hydrochlorothiazide (ZIAC) 10-6.25 MG tablet Take 0.5 tablets by mouth daily. 15 tablet 5  . budesonide-formoterol (SYMBICORT) 160-4.5 MCG/ACT inhaler Inhale 2 puffs into the lungs 2 (two) times daily. 1 Inhaler 12  . buPROPion (WELLBUTRIN XL) 150 MG 24 hr tablet Take 1 tablet (150 mg total) by mouth every morning. 90 tablet 1  . Cholecalciferol (VITAMIN D3) 5000 UNITS TABS Take 5,000 Units by mouth daily.    . clonazePAM (KLONOPIN) 0.5 MG tablet TAKE 1/2-1 TABLET BY MOUTH AT BEDTIME FOR SLEEP 90 tablet 1  .  dextromethorphan-guaiFENesin (MUCINEX DM) 30-600 MG 12hr tablet Take 1 tablet by mouth 2 (two) times daily.    . fluticasone (FLONASE) 50 MCG/ACT nasal spray 1 Spray each nostril daily 48 g 1  . losartan (COZAAR) 100 MG tablet Take 1 tablet daily for BP 90 tablet 1  . montelukast (SINGULAIR) 10 MG tablet TAKE 1 TABLET(10 MG) BY MOUTH DAILY 90 tablet 3  . Multiple Vitamins-Minerals (MULTI COMPLETE PO) Take 1 capsule by mouth daily.    Marland Kitchen NIFEdipine (PROCARDIA XL/ADALAT-CC) 60 MG 24 hr tablet TAKE 1 TABLET BY MOUTH EVERY NIGHT FOR BLOOD PRESSURE 90 tablet 1  . Omega-3 Fatty Acids (FISH OIL) 1000 MG CAPS Take 1 capsule by mouth daily.    Marland Kitchen omeprazole (PRILOSEC) 20 MG capsule TAKE 1 CAPSULE(20 MG)  BY MOUTH EVERY DAY 90 capsule 1  . ranitidine (ZANTAC) 300 MG tablet TAKE 1 TABLET BY MOUTH AT NIGHT TIME FOR 2 WEEKS, THEN TAKE AS NEEDED 90 tablet 1  . ezetimibe (ZETIA) 10 MG tablet Take 1 tablet (10 mg total) by mouth daily. (Patient not taking: Reported on 11/26/2017) 90 tablet 1  . methylcellulose oral powder Take by mouth 3 (three) times daily as needed.     No current facility-administered medications on file prior to visit.     Allergies  Allergen Reactions  . Enalapril Swelling    Caused lips to swell and bloating.  Marland Kitchen No Known Allergies     Blood pressure (!) 140/58.  Essential hypertension Patient blood pressure is above goal pressure today bur patient refused to make changes at this time. "Took a lot of time and changes to find the right combination". Will continue medication as previously prescribed, continue to work on lifestyle modifications, and call back if BP remains >409 systolic.  Hyperlipidemia, mixed LDL remains above goal for primary prevention.  Patient is not taking any medication at this time but continues to follow a low fat diet. After an hour long discussion about available options for lipid management, patient agreed on trial with low dose rosuvastatin.  She is to take rosuvastatin 5mg  weekly x 2 weeks, then 5mg  twice a week for 2 weeks, then increase to 5mg  3x/week. Plan to repeat lipid profile in 6-8 week to assess response.   Jasmaine Rochel Rodriguez-Guzman PharmD, BCPS, [place] [address] [place] [address] 11/28/2017 4:36 PM

## 2017-11-26 NOTE — Patient Instructions (Addendum)
Lipid Clinic [name]/[name] (pharmacist) [phone]  *START rosuvastatin 5mg  every Friday for 2 weeks, then increase to 5mg  every Monday and Friday for 2 weeks, then increase to 5mg  every Monday, Wednesday and Friday*  *Repeat fasting work in 6-8 weeks*   Cholesterol Cholesterol is a fat. Your body needs a small amount of cholesterol. Cholesterol (plaque) may build up in your blood vessels (arteries). That makes you more likely to have a heart attack or stroke. You cannot feel your cholesterol level. Having a blood test is the only way to find out if your level is high. Keep your test results. Work with your doctor to keep your cholesterol at a good level. What do the results mean?  Total cholesterol is how much cholesterol is in your blood.  LDL is bad cholesterol. This is the type that can build up. Try to have low LDL.  HDL is good cholesterol. It cleans your blood vessels and carries LDL away. Try to have high HDL.  Triglycerides are fat that the body can store or burn for energy. What are good levels of cholesterol?  Total cholesterol below 200.  LDL below 100 is good for people who have health risks. LDL below 70 is good for people who have very high risks.  HDL above 40 is good. It is best to have HDL of 60 or higher.  Triglycerides below 150. How can I lower my cholesterol? Diet Follow your diet program as told by your doctor.  Choose fish, white meat chicken, or [place] that is roasted or baked. Try not to eat red meat, fried foods, sausage, or lunch meats.  Eat lots of fresh fruits and vegetables.  Choose whole grains, beans, pasta, potatoes, and cereals.  Choose olive oil, corn oil, or canola oil. Only use small amounts.  Try not to eat butter, mayonnaise, shortening, or palm kernel oils.  Try not to eat foods with trans fats.  Choose low-fat or nonfat dairy foods. ? Drink skim or nonfat milk. ? Eat low-fat or nonfat yogurt and cheeses. ? Try not to drink  whole milk or cream. ? Try not to eat ice cream, egg yolks, or full-fat cheeses.  Healthy desserts include angel food cake, ginger snaps, animal crackers, hard candy, popsicles, and low-fat or nonfat frozen yogurt. Try not to eat pastries, cakes, pies, and cookies.  Exercise Follow your exercise program as told by your doctor.  Be more active. Try gardening, walking, and taking the stairs.  Ask your doctor about ways that you can be more active.  Medicine  Take over-the-counter and prescription medicines only as told by your doctor. This information is not intended to replace advice given to you by your health care provider. Make sure you discuss any questions you have with your health care provider. Document Released: 09/28/2008 Document Revised: 02/01/2016 Document Reviewed: 01/12/2016 Elsevier Interactive Patient Education  [employer].

## 2017-11-28 ENCOUNTER — Encounter: Payer: Self-pay | Admitting: Pharmacist

## 2017-11-28 NOTE — Assessment & Plan Note (Signed)
LDL remains above goal for primary prevention.  Patient is not taking any medication at this time but continues to follow a low fat diet. After an hour long discussion about available options for lipid management, patient agreed on trial with low dose rosuvastatin.  She is to take rosuvastatin 5mg  weekly x 2 weeks, then 5mg  twice a week for 2 weeks, then increase to 5mg  3x/week. Plan to repeat lipid profile in 6-8 week to assess response.

## 2017-11-28 NOTE — Assessment & Plan Note (Signed)
Patient blood pressure is above goal pressure today bur patient refused to make changes at this time. "Took a lot of time and changes to find the right combination". Will continue medication as previously prescribed, continue to work on lifestyle modifications, and call back if BP remains >444 systolic.

## 2017-11-29 DIAGNOSIS — H524 Presbyopia: Secondary | ICD-10-CM | POA: Diagnosis not present

## 2017-12-02 ENCOUNTER — Ambulatory Visit (INDEPENDENT_AMBULATORY_CARE_PROVIDER_SITE_OTHER): Payer: PPO | Admitting: Emergency Medicine

## 2017-12-02 ENCOUNTER — Encounter: Payer: Self-pay | Admitting: Emergency Medicine

## 2017-12-02 ENCOUNTER — Ambulatory Visit (INDEPENDENT_AMBULATORY_CARE_PROVIDER_SITE_OTHER): Payer: Self-pay

## 2017-12-02 DIAGNOSIS — J449 Chronic obstructive pulmonary disease, unspecified: Secondary | ICD-10-CM | POA: Diagnosis not present

## 2017-12-02 DIAGNOSIS — Z0000 Encounter for general adult medical examination without abnormal findings: Secondary | ICD-10-CM

## 2017-12-02 DIAGNOSIS — F17200 Nicotine dependence, unspecified, uncomplicated: Secondary | ICD-10-CM | POA: Diagnosis not present

## 2017-12-02 MED ORDER — ALBUTEROL SULFATE 108 (90 BASE) MCG/ACT IN AEPB: 2.0000 | INHALATION_SPRAY | RESPIRATORY_TRACT | 5 refills | Status: DC | PRN

## 2017-12-02 NOTE — Assessment & Plan Note (Addendum)
Discussed with her today.  She has had some success by going to a tobacco cessation counselor.  She is going to continue to follow with them.  She is down to 10 cigarettes daily.  I encouraged her in this.  She tried bupropion in the past but had to stop due to side effects.  She will likely be a candidate for lung cancer screening.  I will talk to her about this once I see her pulmonary function testing and ensure that she does qualify.

## 2017-12-02 NOTE — Progress Notes (Signed)
Week: 2  Progress Notes: Pt says that she has been successful reducing the amount of cigarettes, are down to 10 cigarettes a day. Is smoking outside, and her husband is in full support.   Challenges: She says that she has gotten down to 8 cigarettes, but met up with a friend and smoked more than she intended to.   Opportunities: Provided pt with information on curbing cravings, as well as water aerobics class information. Is working with her PCP to assist with her breathing and smoking as well.   Client Commitment/Agreement for Next Session:  Pt agreed to get down to 8 cigarettes a day. Also agreed to enroll in one of the classes at the [facility]

## 2017-12-02 NOTE — Progress Notes (Signed)
Subjective:    Patient ID: [name], female    DOB: Aug 26, 1950, 67 y.o.   MRN: [id number]  HPI 67 year old smoker (30 pack years) with a history of hypertension, diabetes, hyperlipidemia, allergic rhinitis.  She has a family history of lung cancer.  She is referred today for evaluation of COPD.  She she reports that she has been having more cough over the last few months, clear and easy to move. May be more in the am. She is having some sporadic dyspnea, can happen at rest, can happen with stairs. She hears mucous in her throat, no overt wheeze. She had a CXR 5/8 that showed some emphysematous change, ? L basilar atx change. She is on singulair prn, nasal saline washes, nasal steroids prn. She was just started on Symbicort 2 weeks ago. She has not noticed any change in breathing or mucous.    Review of Systems  Constitutional: Negative for fever and unexpected weight change.  HENT: Negative for congestion, dental problem, ear pain, nosebleeds, postnasal drip, rhinorrhea, sinus pressure, sneezing, sore throat and trouble swallowing.   Eyes: Negative for redness and itching.  Respiratory: Positive for cough and shortness of breath. Negative for chest tightness and wheezing.   Cardiovascular: Negative for palpitations and leg swelling.  Gastrointestinal: Negative for nausea and vomiting.  Genitourinary: Negative for dysuria.  Musculoskeletal: Negative for joint swelling.  Skin: Negative for rash.  Neurological: Negative for headaches.  Hematological: Does not bruise/bleed easily.  Psychiatric/Behavioral: Negative for dysphoric mood. The patient is not nervous/anxious.   \ Past Medical History:  Diagnosis Date  . Allergy    enviromental  . Anxiety   . Arthritis    osteoarthritis  . COPD (chronic obstructive pulmonary disease) (HCC)    mild  . Diabetes mellitus without complication ([place])    controlled by diet  . Environmental allergies   . Hepatitis    hepatitis A  .  Hyperlipidemia   . Hypertension   . Mixed hyperlipidemia 06/15/2013  . PONV (postoperative nausea and vomiting)    second surgery no problems last surgery  . Urgency of urination    Takes oxybutynin; under control at this time     Family History  Problem Relation Age of Onset  . Diabetes Mother   . Colon cancer Mother   . Hypertension Father   . CAD Father   . Colon cancer Sister   . Lung cancer Brother   . Hypertension Brother   . Hypertension Brother   . Esophageal cancer Neg Hx   . Rectal cancer Neg Hx   . Stomach cancer Neg Hx      Social History   Socioeconomic History  . Marital status: Married    Spouse name: Not on file  . Number of children: 2  . Years of education: Not on file  . Highest education level: Not on file  Occupational History  . Not on file  Social Needs  . Financial resource strain: Not on file  . Food insecurity:    Worry: Not on file    Inability: Not on file  . Transportation needs:    Medical: Not on file    Non-medical: Not on file  Tobacco Use  . Smoking status: Current Every Day Smoker    Packs/day: 0.50    Years: 30.00    Pack years: 15.00    Types: Cigarettes    Start date: 79  . Smokeless tobacco: Never Used  Substance and Sexual Activity  .  Alcohol use: Yes    Alcohol/week: 8.4 oz    Types: 14 Glasses of wine per week    Comment: either a glass of wine a night or 2 beers-advised none within 48 hrs of surgery  . Drug use: Yes    Types: Marijuana    Comment: last 06/12/16- advised none within 48 hrs of surgery  . Sexual activity: Yes    Birth control/protection: Surgical  Lifestyle  . Physical activity:    Days per week: Not on file    Minutes per session: Not on file  . Stress: Not on file  Relationships  . Social connections:    Talks on phone: Not on file    Gets together: Not on file    Attends religious service: Not on file    Active member of club or organization: Not on file    Attends meetings of clubs or  organizations: Not on file    Relationship status: Not on file  . Intimate partner violence:    Fear of current or ex partner: Not on file    Emotionally abused: Not on file    Physically abused: Not on file    Forced sexual activity: Not on file  Other Topics Concern  . Not on file  Social History Narrative  . Not on file     Allergies  Allergen Reactions  . Enalapril Swelling    Caused lips to swell and bloating.  Marland Kitchen No Known Allergies      Outpatient Medications Prior to Visit  Medication Sig Dispense Refill  . amLODipine (NORVASC) 2.5 MG tablet Take 1 tablet (2.5 mg total) by mouth 2 (two) times daily. 60 tablet 5  . aspirin 81 MG tablet Take 81 mg by mouth daily.    . bisoprolol-hydrochlorothiazide (ZIAC) 10-6.25 MG tablet Take 0.5 tablets by mouth daily. 15 tablet 5  . budesonide-formoterol (SYMBICORT) 160-4.5 MCG/ACT inhaler Inhale 2 puffs into the lungs 2 (two) times daily. 1 Inhaler 12  . buPROPion (WELLBUTRIN XL) 150 MG 24 hr tablet Take 1 tablet (150 mg total) by mouth every morning. 90 tablet 1  . Cholecalciferol (VITAMIN D3) 5000 UNITS TABS Take 5,000 Units by mouth daily.    Marland Kitchen dextromethorphan-guaiFENesin (MUCINEX DM) 30-600 MG 12hr tablet Take 1 tablet by mouth 2 (two) times daily.    . fluticasone (FLONASE) 50 MCG/ACT nasal spray 1 Spray each nostril daily 48 g 1  . losartan (COZAAR) 100 MG tablet Take 1 tablet daily for BP 90 tablet 1  . methylcellulose oral powder Take by mouth 3 (three) times daily as needed.    . montelukast (SINGULAIR) 10 MG tablet TAKE 1 TABLET(10 MG) BY MOUTH DAILY 90 tablet 3  . Multiple Vitamins-Minerals (MULTI COMPLETE PO) Take 1 capsule by mouth daily.    Marland Kitchen NIFEdipine (PROCARDIA XL/ADALAT-CC) 60 MG 24 hr tablet TAKE 1 TABLET BY MOUTH EVERY NIGHT FOR BLOOD PRESSURE 90 tablet 1  . Omega-3 Fatty Acids (FISH OIL) 1000 MG CAPS Take 1 capsule by mouth daily.    Marland Kitchen omeprazole (PRILOSEC) 20 MG capsule TAKE 1 CAPSULE(20 MG) BY MOUTH EVERY DAY 90  capsule 1  . ranitidine (ZANTAC) 300 MG tablet TAKE 1 TABLET BY MOUTH AT NIGHT TIME FOR 2 WEEKS, THEN TAKE AS NEEDED 90 tablet 1  . rosuvastatin (CRESTOR) 5 MG tablet SEE NOTES 27 tablet 0  . clonazePAM (KLONOPIN) 0.5 MG tablet TAKE 1/2-1 TABLET BY MOUTH AT BEDTIME FOR SLEEP 90 tablet 1  . ezetimibe (ZETIA)  10 MG tablet Take 1 tablet (10 mg total) by mouth daily. (Patient not taking: Reported on 11/26/2017) 90 tablet 1   No facility-administered medications prior to visit.         Objective:   Physical Exam Vitals:   12/02/17 0926 12/02/17 0927  BP:  122/72  Pulse:  70  SpO2:  95%  Weight: 164 lb (74.4 kg)   Height: 5\' 5"  (1.651 m)    Gen: Pleasant, well-nourished, in no distress,  normal affect  ENT: No lesions,  mouth clear,  oropharynx clear, no postnasal drip  Neck: No JVD, no stridor  Lungs: No use of accessory muscles, a few rhonchi best heard on a forced exp. Clear with cough. No wheeze  Cardiovascular: RRR, heart sounds normal, no murmur or gallops, no peripheral edema  Musculoskeletal: No deformities, no cyanosis or clubbing  Neuro: alert, non focal  Skin: Warm, no lesions or rashes     Assessment & Plan:  Current smoker Discussed with her today.  She has had some success by going to a tobacco cessation counselor.  She is going to continue to follow with them.  She is down to 10 cigarettes daily.  I encouraged her in this.  She tried bupropion in the past but had to stop due to side effects.  She will likely be a candidate for lung cancer screening.  I will talk to her about this once I see her pulmonary function testing and ensure that she does qualify.  COPD (chronic obstructive pulmonary disease) ([place]) Suspect based on her symptoms, her chest x-ray that she does have some COPD.  We need to quantify this with pulmonary function testing.  I like to at least temporarily stop her Symbicort so that we can assess her degree of obstruction completely.  We will give  her albuterol to use as needed in the interim.  If she does have significant obstruction then we may be able to find a better choice than the Symbicort, probably start w LAMA unless she has a bronchodilator response.   Baltazar Apo, MD, PhD 12/02/2017, 10:04 AM [facility] [phone] or if no answer [phone]

## 2017-12-02 NOTE — Assessment & Plan Note (Signed)
Suspect based on her symptoms, her chest x-ray that she does have some COPD.  We need to quantify this with pulmonary function testing.  I like to at least temporarily stop her Symbicort so that we can assess her degree of obstruction completely.  We will give her albuterol to use as needed in the interim.  If she does have significant obstruction then we may be able to find a better choice than the Symbicort, probably start w LAMA unless she has a bronchodilator response.

## 2017-12-02 NOTE — Patient Instructions (Addendum)
Stop your Symbicort temporarily.  Keep working with your smoking cessation counseling. Congratulations on your progress so far.  Please use albuterol 2 puffs up to every 4 hours if needed for shortness of breath, wheezing, chest tightness. We will perform full pulmonary function testing. Please start taking Singulair 10 mg every evening until next visit. Please start using your fluticasone nasal spray 2 sprays each nostril once a day. Continue to use your nasal saline washes as needed for allergy symptoms and mucus control. Follow with Dr Lamonte Sakai next available with full PFT

## 2017-12-11 ENCOUNTER — Ambulatory Visit (INDEPENDENT_AMBULATORY_CARE_PROVIDER_SITE_OTHER): Payer: PPO | Admitting: Emergency Medicine

## 2017-12-11 DIAGNOSIS — J449 Chronic obstructive pulmonary disease, unspecified: Secondary | ICD-10-CM

## 2017-12-11 LAB — PULMONARY FUNCTION TEST
DL/VA % pred: 74 %
DL/VA: 3.66 ml/min/mmHg/L
DLCO unc % pred: 71 %
DLCO unc: 18.39 ml/min/mmHg
FEF 25-75 POST: 1.9 L/s
FEF 25-75 Pre: 1.76 L/sec
FEF2575-%Change-Post: 7 %
FEF2575-%PRED-POST: 90 %
FEF2575-%Pred-Pre: 84 %
FEV1-%CHANGE-POST: 4 %
FEV1-%PRED-PRE: 95 %
FEV1-%Pred-Post: 100 %
FEV1-PRE: 2.34 L
FEV1-Post: 2.45 L
FEV1FVC-%Change-Post: 11 %
FEV1FVC-%PRED-PRE: 92 %
FEV6-%Change-Post: -5 %
FEV6-%Pred-Post: 99 %
FEV6-%Pred-Pre: 105 %
FEV6-PRE: 3.25 L
FEV6-Post: 3.08 L
FEV6FVC-%Change-Post: 0 %
FEV6FVC-%PRED-POST: 103 %
FEV6FVC-%PRED-PRE: 102 %
FVC-%Change-Post: -5 %
FVC-%PRED-POST: 97 %
FVC-%PRED-PRE: 102 %
FVC-POST: 3.12 L
FVC-PRE: 3.3 L
PRE FEV6/FVC RATIO: 99 %
Post FEV1/FVC ratio: 79 %
Post FEV6/FVC ratio: 99 %
Pre FEV1/FVC ratio: 71 %
RV % pred: 127 %
RV: 2.77 L
TLC % PRED: 125 %
TLC: 6.5 L

## 2017-12-11 NOTE — Progress Notes (Signed)
PFT completed today.  

## 2017-12-12 ENCOUNTER — Ambulatory Visit (INDEPENDENT_AMBULATORY_CARE_PROVIDER_SITE_OTHER): Payer: PPO | Admitting: Emergency Medicine

## 2017-12-12 ENCOUNTER — Encounter: Payer: Self-pay | Admitting: Emergency Medicine

## 2017-12-12 DIAGNOSIS — J449 Chronic obstructive pulmonary disease, unspecified: Secondary | ICD-10-CM

## 2017-12-12 DIAGNOSIS — R05 Cough: Secondary | ICD-10-CM | POA: Insufficient documentation

## 2017-12-12 DIAGNOSIS — R053 Chronic cough: Secondary | ICD-10-CM | POA: Insufficient documentation

## 2017-12-12 DIAGNOSIS — F17200 Nicotine dependence, unspecified, uncomplicated: Secondary | ICD-10-CM

## 2017-12-12 MED ORDER — TIOTROPIUM BROMIDE MONOHYDRATE 2.5 MCG/ACT IN AERS: 2.0000 | INHALATION_SPRAY | Freq: Every day | RESPIRATORY_TRACT | 0 refills | Status: DC

## 2017-12-12 NOTE — Addendum Note (Signed)
Addended by: Desmond Dike C on: 12/12/2017 03:15 PM   Modules accepted: Orders

## 2017-12-12 NOTE — Assessment & Plan Note (Signed)
Mild obstruction on her pulmonary function testing today.  I would like to try her on Spiriva to see if she benefits.  She knows that she needs to stop smoking and this is an important part of the plan.

## 2017-12-12 NOTE — Patient Instructions (Addendum)
We will decrease your cigarettes down to 7 a day by our next visit.  We will start Spiriva respimat, 2 puffs once a day Keep albuterol available to use 2 puffs if needed for shortness of breath, wheeze, chest tightness.  Continue your other medications as you have been taking them  We will refer you to the Lung Cancer Screening Program Follow with Dr Lamonte Sakai in 2 months or sooner if you have any problems.

## 2017-12-12 NOTE — Assessment & Plan Note (Signed)
Impacted by some breakthrough GERD, some breakthrough rhinitis.  Also clearly impacted by her smoking.  She is going to work on her diet, continue her omeprazole and Zantac.  Also continue nasal washes, Mucinex, Flonase, Singulair.

## 2017-12-12 NOTE — Assessment & Plan Note (Signed)
Discussed cessation strategies with her today.  As a first step we may know agreement that she would cut down to 7 cigarettes daily by our next visit.  Once we have been able to decrease her daily usage we will talk about setting a quit date, strategies to succeed.  She will also likely benefit from some additional counseling that will be available when she takes part of the lung cancer screening program  We will decrease your cigarettes down to 7 a day by our next visit.  We will refer you to the Lung Cancer Screening Program Follow with Dr Lamonte Sakai in 2 months or sooner if you have any problems.

## 2017-12-12 NOTE — Progress Notes (Signed)
Subjective:    Patient ID: [name], female    DOB: [birth year], 67 y.o.   MRN: [id number]  HPI 67 year old smoker (30 pack years) with a history of hypertension, diabetes, hyperlipidemia, allergic rhinitis.  She has a family history of lung cancer.  She is referred today for evaluation of COPD.  She she reports that she has been having more cough over the last few months, clear and easy to move. May be more in the am. She is having some sporadic dyspnea, can happen at rest, can happen with stairs. She hears mucous in her throat, no overt wheeze. She had a CXR 5/8 that showed some emphysematous change, ? L basilar atx change. She is on singulair prn, nasal saline washes, nasal steroids prn. She was just started on Symbicort 2 weeks ago. She has not noticed any change in breathing or mucous.   ROV 12/12/17 --patient follows up today for her dyspnea.  History of tobacco use.  She underwent pulmonary function testing on 12/11/2017.  This shows evidence for mild obstruction without a bronchodilator response, normal lung volumes and a slightly decreased diffusion capacity that does not correct to the normal range when adjusted for alveolar volume. She feel some dyspnea. She is doing a lot of coughing, seems to be impacted by poor GERD control. She is on [place], mucinex, flonase, singulair.    Review of Systems  Constitutional: Negative for fever and unexpected weight change.  HENT: Negative for congestion, dental problem, ear pain, nosebleeds, postnasal drip, rhinorrhea, sinus pressure, sneezing, sore throat and trouble swallowing.   Eyes: Negative for redness and itching.  Respiratory: Positive for cough and shortness of breath. Negative for chest tightness and wheezing.   Cardiovascular: Negative for palpitations and leg swelling.  Gastrointestinal: Negative for nausea and vomiting.  Genitourinary: Negative for dysuria.  Musculoskeletal: Negative for joint swelling.  Skin: Negative for rash.    Neurological: Negative for headaches.  Hematological: Does not bruise/bleed easily.  Psychiatric/Behavioral: Negative for dysphoric mood. The patient is not nervous/anxious.   \ Past Medical History:  Diagnosis Date  . Allergy    enviromental  . Anxiety   . Arthritis    osteoarthritis  . COPD (chronic obstructive pulmonary disease) (HCC)    mild  . Diabetes mellitus without complication ([place])    controlled by diet  . Environmental allergies   . Hepatitis    hepatitis A  . Hyperlipidemia   . Hypertension   . Mixed hyperlipidemia 06/15/2013  . PONV (postoperative nausea and vomiting)    second surgery no problems last surgery  . Urgency of urination    Takes oxybutynin; under control at this time     Family History  Problem Relation Age of Onset  . Diabetes Mother   . Colon cancer Mother   . Hypertension Father   . CAD Father   . Colon cancer Sister   . Lung cancer Brother   . Hypertension Brother   . Hypertension Brother   . Esophageal cancer Neg Hx   . Rectal cancer Neg Hx   . Stomach cancer Neg Hx      Social History   Socioeconomic History  . Marital status: Married    Spouse name: Not on file  . Number of children: 2  . Years of education: Not on file  . Highest education level: Not on file  Occupational History  . Not on file  Social Needs  . Financial resource strain: Not on file  .  Food insecurity:    Worry: Not on file    Inability: Not on file  . Transportation needs:    Medical: Not on file    Non-medical: Not on file  Tobacco Use  . Smoking status: Current Every Day Smoker    Packs/day: 0.50    Years: 30.00    Pack years: 15.00    Types: Cigarettes    Start date: 70  . Smokeless tobacco: Never Used  Substance and Sexual Activity  . Alcohol use: Yes    Alcohol/week: 8.4 oz    Types: 14 Glasses of wine per week    Comment: either a glass of wine a night or 2 beers-advised none within 48 hrs of surgery  . Drug use: Yes    Types:  Marijuana    Comment: last 06/12/16- advised none within 48 hrs of surgery  . Sexual activity: Yes    Birth control/protection: Surgical  Lifestyle  . Physical activity:    Days per week: Not on file    Minutes per session: Not on file  . Stress: Not on file  Relationships  . Social connections:    Talks on phone: Not on file    Gets together: Not on file    Attends religious service: Not on file    Active member of club or organization: Not on file    Attends meetings of clubs or organizations: Not on file    Relationship status: Not on file  . Intimate partner violence:    Fear of current or ex partner: Not on file    Emotionally abused: Not on file    Physically abused: Not on file    Forced sexual activity: Not on file  Other Topics Concern  . Not on file  Social History Narrative  . Not on file     Allergies  Allergen Reactions  . Enalapril Swelling    Caused lips to swell and bloating.  Marland Kitchen No Known Allergies      Outpatient Medications Prior to Visit  Medication Sig Dispense Refill  . Albuterol Sulfate (PROAIR RESPICLICK) 956 (90 Base) MCG/ACT AEPB Inhale 2 puffs into the lungs every 4 (four) hours as needed. 1 each 5  . amLODipine (NORVASC) 2.5 MG tablet Take 1 tablet (2.5 mg total) by mouth 2 (two) times daily. 60 tablet 5  . aspirin 81 MG tablet Take 81 mg by mouth daily.    . bisoprolol-hydrochlorothiazide (ZIAC) 10-6.25 MG tablet Take 0.5 tablets by mouth daily. 15 tablet 5  . budesonide-formoterol (SYMBICORT) 160-4.5 MCG/ACT inhaler Inhale 2 puffs into the lungs 2 (two) times daily. 1 Inhaler 12  . buPROPion (WELLBUTRIN XL) 150 MG 24 hr tablet Take 1 tablet (150 mg total) by mouth every morning. 90 tablet 1  . Cholecalciferol (VITAMIN D3) 5000 UNITS TABS Take 5,000 Units by mouth daily.    Marland Kitchen dextromethorphan-guaiFENesin (MUCINEX DM) 30-600 MG 12hr tablet Take 1 tablet by mouth 2 (two) times daily.    . fluticasone (FLONASE) 50 MCG/ACT nasal spray 1 Spray each  nostril daily 48 g 1  . losartan (COZAAR) 100 MG tablet Take 1 tablet daily for BP 90 tablet 1  . methylcellulose oral powder Take by mouth 3 (three) times daily as needed.    . montelukast (SINGULAIR) 10 MG tablet TAKE 1 TABLET(10 MG) BY MOUTH DAILY 90 tablet 3  . Multiple Vitamins-Minerals (MULTI COMPLETE PO) Take 1 capsule by mouth daily.    Marland Kitchen NIFEdipine (PROCARDIA XL/ADALAT-CC) 60 MG 24  hr tablet TAKE 1 TABLET BY MOUTH EVERY NIGHT FOR BLOOD PRESSURE 90 tablet 1  . Omega-3 Fatty Acids (FISH OIL) 1000 MG CAPS Take 1 capsule by mouth daily.    Marland Kitchen omeprazole (PRILOSEC) 20 MG capsule TAKE 1 CAPSULE(20 MG) BY MOUTH EVERY DAY 90 capsule 1  . ranitidine (ZANTAC) 300 MG tablet TAKE 1 TABLET BY MOUTH AT NIGHT TIME FOR 2 WEEKS, THEN TAKE AS NEEDED 90 tablet 1  . rosuvastatin (CRESTOR) 5 MG tablet SEE NOTES 27 tablet 0  . clonazePAM (KLONOPIN) 0.5 MG tablet TAKE 1/2-1 TABLET BY MOUTH AT BEDTIME FOR SLEEP 90 tablet 1   No facility-administered medications prior to visit.         Objective:   Physical Exam Vitals:   12/12/17 1450  BP: (!) 150/78  Pulse: (!) 59  SpO2: 96%  Weight: 165 lb (74.8 kg)  Height: 5\' 5"  (1.651 m)   Gen: Pleasant, well-nourished, in no distress,  normal affect  ENT: No lesions,  mouth clear,  oropharynx clear, no postnasal drip  Neck: No JVD, no stridor  Lungs: No use of accessory muscles, a few rhonchi best heard on a forced exp. Clear with cough. No wheeze  Cardiovascular: RRR, heart sounds normal, no murmur or gallops, no peripheral edema  Musculoskeletal: No deformities, no cyanosis or clubbing  Neuro: alert, non focal  Skin: Warm, no lesions or rashes     Assessment & Plan:  Current smoker Discussed cessation strategies with her today.  As a first step we may know agreement that she would cut down to 7 cigarettes daily by our next visit.  Once we have been able to decrease her daily usage we will talk about setting a quit date, strategies to succeed.   She will also likely benefit from some additional counseling that will be available when she takes part of the lung cancer screening program  We will decrease your cigarettes down to 7 a day by our next visit.  We will refer you to the Lung Cancer Screening Program Follow with Dr Lamonte Sakai in 2 months or sooner if you have any problems.  COPD (chronic obstructive pulmonary disease) (HCC) Mild obstruction on her pulmonary function testing today.  I would like to try her on Spiriva to see if she benefits.  She knows that she needs to stop smoking and this is an important part of the plan.  Chronic cough Impacted by some breakthrough GERD, some breakthrough rhinitis.  Also clearly impacted by her smoking.  She is going to work on her diet, continue her omeprazole and Zantac.  Also continue nasal washes, Mucinex, Flonase, Singulair.  Baltazar Apo, MD, PhD 12/12/2017, 3:08 PM [facility] [phone] or if no answer [phone]

## 2017-12-16 ENCOUNTER — Telehealth: Payer: Self-pay

## 2017-12-16 ENCOUNTER — Other Ambulatory Visit: Payer: Self-pay | Admitting: Internal Medicine

## 2017-12-16 NOTE — Telephone Encounter (Signed)
Telephonic Care Guide Session with patient. Says she is having issues reducing the amount of cigarettes while she is socializing with friends, care guide will reach out to smoking cessation specialist for tips. Is trying to be down to 7 cigarettes by her next apt with her pulmonologist.  Next apt on 6/17 at 11

## 2017-12-24 ENCOUNTER — Other Ambulatory Visit: Payer: Self-pay

## 2017-12-24 DIAGNOSIS — Z1212 Encounter for screening for malignant neoplasm of rectum: Secondary | ICD-10-CM | POA: Diagnosis not present

## 2017-12-24 DIAGNOSIS — Z1211 Encounter for screening for malignant neoplasm of colon: Secondary | ICD-10-CM

## 2017-12-30 ENCOUNTER — Ambulatory Visit (INDEPENDENT_AMBULATORY_CARE_PROVIDER_SITE_OTHER): Payer: Self-pay

## 2017-12-30 DIAGNOSIS — Z0000 Encounter for general adult medical examination without abnormal findings: Secondary | ICD-10-CM

## 2017-12-30 NOTE — Progress Notes (Signed)
Week: 4  Progress Notes: Pt is currently taking a water aerobics class at the [facility]. Goes 3x a week for 1hr. Has noticed an increase in energy. Has decided to cut out the caffeine as she has noticed that the caffeine makes her anxious and she then needs another cigarette to calm her down. Is also working on changing her diet, because she recognizes the importance of making lifestyle changes. Says she really appreciates coming to these appointments as it gives her support and motivation.   Challenges: Pt is having issues with anxiety at times. Says she notices that her body tenses up and she uses smoking for relief. Discussed with patient possible strategies for coping with stress including breathing, and delaying cigarettes in time increments ( 5 mins, 10, mins, etc).   Opportunities: Pt says she is feeling really good about herself now that she is able to set goals and make them. Is taking the time to focus on herself, and increase physical activity.   Client Commitment/Agreement for Next Session: Client agrees to work on eliminating her morning cigarettes as those are the hardest ones. Will work on stress coping techniques. Next session 7/1 at 11

## 2018-01-07 ENCOUNTER — Telehealth: Payer: Self-pay | Admitting: Cardiology

## 2018-01-07 NOTE — Telephone Encounter (Signed)
New Message    Pt c/o medication issue:  1. Name of Medication:rosuvastatin (CRESTOR) 5 MG tablet  2. How are you currently taking this medication (dosage and times per day)?   3. Are you having a reaction (difficulty breathing--STAT)?   4. What is your medication issue? Patient called because this medication is giving her side effects. Placed her on hold to see if Raquel was available (that's who she wanted) buit she hung up. No answer when I called back.

## 2018-01-07 NOTE — Telephone Encounter (Signed)
Patient agreed on starting paperwork for [id number]. Will start prio-authoration and follow [place] with patient as soon as answered known.

## 2018-01-08 ENCOUNTER — Ambulatory Visit: Payer: PPO | Admitting: Emergency Medicine

## 2018-01-08 ENCOUNTER — Ambulatory Visit (INDEPENDENT_AMBULATORY_CARE_PROVIDER_SITE_OTHER): Payer: PPO | Admitting: Internal Medicine

## 2018-01-08 ENCOUNTER — Encounter: Payer: Self-pay | Admitting: Internal Medicine

## 2018-01-08 VITALS — BP 116/74 | HR 60 | Temp 97.5°F | Resp 16 | Ht 66.0 in | Wt 163.8 lb

## 2018-01-08 DIAGNOSIS — I10 Essential (primary) hypertension: Secondary | ICD-10-CM | POA: Diagnosis not present

## 2018-01-08 DIAGNOSIS — E782 Mixed hyperlipidemia: Secondary | ICD-10-CM | POA: Diagnosis not present

## 2018-01-08 DIAGNOSIS — K219 Gastro-esophageal reflux disease without esophagitis: Secondary | ICD-10-CM | POA: Diagnosis not present

## 2018-01-08 DIAGNOSIS — R7303 Prediabetes: Secondary | ICD-10-CM | POA: Diagnosis not present

## 2018-01-08 DIAGNOSIS — E559 Vitamin D deficiency, unspecified: Secondary | ICD-10-CM | POA: Diagnosis not present

## 2018-01-08 DIAGNOSIS — I700 Atherosclerosis of aorta: Secondary | ICD-10-CM

## 2018-01-08 DIAGNOSIS — Z136 Encounter for screening for cardiovascular disorders: Secondary | ICD-10-CM | POA: Diagnosis not present

## 2018-01-08 DIAGNOSIS — Z79899 Other long term (current) drug therapy: Secondary | ICD-10-CM

## 2018-01-08 DIAGNOSIS — R7309 Other abnormal glucose: Secondary | ICD-10-CM | POA: Diagnosis not present

## 2018-01-08 DIAGNOSIS — F17200 Nicotine dependence, unspecified, uncomplicated: Secondary | ICD-10-CM

## 2018-01-08 DIAGNOSIS — Z1212 Encounter for screening for malignant neoplasm of rectum: Secondary | ICD-10-CM | POA: Diagnosis not present

## 2018-01-08 DIAGNOSIS — Z8249 Family history of ischemic heart disease and other diseases of the circulatory system: Secondary | ICD-10-CM | POA: Diagnosis not present

## 2018-01-08 DIAGNOSIS — Z0001 Encounter for general adult medical examination with abnormal findings: Secondary | ICD-10-CM | POA: Diagnosis not present

## 2018-01-08 DIAGNOSIS — J449 Chronic obstructive pulmonary disease, unspecified: Secondary | ICD-10-CM

## 2018-01-08 DIAGNOSIS — Z1211 Encounter for screening for malignant neoplasm of colon: Secondary | ICD-10-CM | POA: Diagnosis not present

## 2018-01-08 NOTE — Progress Notes (Signed)
[facility] Unk Pinto, M.D.     Uvaldo Bristle. Silverio Lay, P.A.-C Liane Comber, [place], [place] [address] Telephone [phone] Telefax [phone] Annual Screening/Preventative Visit & Comprehensive Evaluation &  Examination     This very nice 67 y.o. married [place] presents for a Screening /Preventative Visit & comprehensive evaluation and management of multiple medical co-morbidities.  Patient has been followed for HTN, HLD, Prediabetes  and Vitamin D Deficiency. Patient also is followed by Dr Lamonte Sakai for COPD and smoking cessation. Patient's GERD is controlled on her current meds.      HTN predates circa 1988. Patient's BP has been controlled at home and patient denies any cardiac symptoms as chest pain, palpitations, shortness of breath, dizziness or ankle swelling. Today's BP is at goal - 116/74.  Patient had an equivocal ETT in Sept 2018 and f/u ECHO stress test was reported "No Ischemia" by Dr Stanford Breed.     Patient's hyperlipidemia is not controlled with diet and she reports  statin intolerance - alto currently she is taking low dose Crestor 2 x/week thru the lipid clinic. Also application is made for PCSK9i.  Patient denies current myalgias or other medication SE's. Last lipids were not at goal: Lab Results  Component Value Date   CHOL 252 (H) 10/22/2017   HDL 86 10/22/2017   LDLCALC 141 (H) 10/22/2017   TRIG 130 10/22/2017   CHOLHDL 2.9 10/22/2017      Patient has prediabetes (A1c 5.7%/2017) and patient denies reactive hypoglycemic symptoms, visual blurring, diabetic polys, or paresthesias. Last A1c was Normal & at goal: Lab Results  Component Value Date   HGBA1C 5.5 07/10/2017      Finally, patient has history of Vitamin D Deficiency and last Vitamin D was at goal:  Lab Results  Component Value Date   VD25OH 92 10/22/2017   Current Outpatient Medications  on File Prior to Visit  Medication Sig  . Albuterol Sulfate (PROAIR RESPICLICK) 937 (90 Base) MCG/ACT AEPB Inhale 2 puffs into the lungs every 4 (four) hours as needed.  Marland Kitchen amLODipine (NORVASC) 2.5 MG tablet Take 1 tablet (2.5 mg total) by mouth 2 (two) times daily.  Marland Kitchen aspirin 81 MG tablet Take 81 mg by mouth daily.  . bisoprolol-hydrochlorothiazide (ZIAC) 10-6.25 MG tablet Take 0.5 tablets by mouth daily.  . budesonide-formoterol (SYMBICORT) 160-4.5 MCG/ACT inhaler Inhale 2 puffs into the lungs 2 (two) times daily.  Marland Kitchen buPROPion (WELLBUTRIN XL) 150 MG 24 hr tablet Take 1 tablet (150 mg total) by mouth every morning.  . Cholecalciferol (VITAMIN D3) 5000 UNITS TABS Take 5,000 Units by mouth daily.  Marland Kitchen dextromethorphan-guaiFENesin (MUCINEX DM) 30-600 MG 12hr tablet Take 1 tablet by mouth 2 (two) times daily.  . fluticasone (FLONASE) 50 MCG/ACT nasal spray 1 Spray each nostril daily  . losartan (COZAAR) 100 MG tablet TAKE 1 TABLET BY MOUTH DAILY FOR BLOOD PRESSURE  . methylcellulose oral powder Take by mouth 3 (three) times daily as needed.  . montelukast (SINGULAIR) 10 MG tablet TAKE 1 TABLET(10 MG) BY MOUTH DAILY  . Multiple Vitamins-Minerals (MULTI COMPLETE PO) Take 1 capsule by mouth daily.  Marland Kitchen NIFEdipine (PROCARDIA XL/ADALAT-CC) 60 MG 24 hr tablet TAKE 1 TABLET BY MOUTH EVERY NIGHT FOR BLOOD PRESSURE  . Omega-3 Fatty Acids (FISH OIL) 1000 MG CAPS Take 1 capsule by mouth daily.  Marland Kitchen omeprazole (PRILOSEC) 20 MG capsule TAKE 1 CAPSULE(20 MG) BY MOUTH EVERY DAY  .  ranitidine (ZANTAC) 300 MG tablet TAKE 1 TABLET BY MOUTH AT NIGHT TIME FOR 2 WEEKS, THEN TAKE AS NEEDED  . rosuvastatin (CRESTOR) 5 MG tablet SEE NOTES  . clonazePAM (KLONOPIN) 0.5 MG tablet TAKE 1/2-1 TABLET BY MOUTH AT BEDTIME FOR SLEEP   No current facility-administered medications on file prior to visit.    Allergies  Allergen Reactions  . Enalapril Swelling    Caused lips to swell and bloating.  Marland Kitchen No Known Allergies    Past  Medical History:  Diagnosis Date  . Allergy    enviromental  . Anxiety   . Arthritis    osteoarthritis  . COPD (chronic obstructive pulmonary disease) (HCC)    mild  . Diabetes mellitus without complication ([place])    controlled by diet  . Environmental allergies   . Hepatitis    hepatitis A  . Hyperlipidemia   . Hypertension   . Mixed hyperlipidemia 06/15/2013  . PONV (postoperative nausea and vomiting)    second surgery no problems last surgery  . Urgency of urination    Takes oxybutynin; under control at this time   Health Maintenance  Topic Date Due  . INFLUENZA VACCINE  02/13/2018  . MAMMOGRAM  05/25/2018  . TETANUS/TDAP  02/06/2021  . COLONOSCOPY  02/09/2024  . DEXA SCAN  Completed  . Hepatitis C Screening  Completed  . PNA vac Low Risk Adult  Completed   Immunization History  Administered Date(s) Administered  . Influenza, High Dose Seasonal PF 04/10/2017  . PPD Test 09/21/2014  . Pneumococcal Conjugate-13 10/22/2017  . Pneumococcal Polysaccharide-23 02/07/2011, 01/26/2016  . Tdap 02/07/2011   Last Colon - 02/08/2014 - Dr Deatra Ina - recc 5 yr f/u due July 2020  Last MGM - 05/25/2016  Past Surgical History:  Procedure Laterality Date  . APPENDECTOMY     removed as child  . BACK SURGERY     lumbar fusion  . COLONOSCOPY    . DILATION AND CURETTAGE OF UTERUS    . LUMBAR FUSION  02/06/2012   L 4  L5  . PARTIAL HYSTERECTOMY    . TOTAL HIP ARTHROPLASTY  06/02/2012   Procedure: TOTAL HIP ARTHROPLASTY;  Surgeon: Kerin Salen, MD;  Location: [facility];  Service: Orthopedics;  Laterality: Right;  . TOTAL HIP ARTHROPLASTY Left 01/25/2016  . TOTAL HIP ARTHROPLASTY Left 01/25/2016   Procedure: LEFT TOTAL HIP ARTHROPLASTY;  Surgeon: Frederik Pear, MD;  Location: [facility];  Service: Orthopedics;  Laterality: Left;  . TOTAL KNEE ARTHROPLASTY Right 12/15/2012   Procedure: TOTAL KNEE ARTHROPLASTY;  Surgeon: Kerin Salen, MD;  Location: [facility];  Service: Orthopedics;  Laterality: Right;   . TOTAL KNEE ARTHROPLASTY Left 06/18/2016   Procedure: TOTAL KNEE ARTHROPLASTY;  Surgeon: Frederik Pear, MD;  Location: [facility];  Service: Orthopedics;  Laterality: Left;   Family History  Problem Relation Age of Onset  . Diabetes Mother   . Colon cancer Mother   . Hypertension Father   . CAD Father   . Colon cancer Sister   . Lung cancer Brother   . Hypertension Brother   . Hypertension Brother   . Esophageal cancer Neg Hx   . Rectal cancer Neg Hx   . Stomach cancer Neg Hx    Social History   Tobacco Use  . Smoking status: Current Every Day Smoker    Packs/day: 0.50    Years: 30.00    Pack years: 15.00    Types: Cigarettes    Start date: 40  .  Smokeless tobacco: Never Used  Substance Use Topics  . Alcohol use: Yes    Alcohol/week: 8.4 oz    Types: 14 Glasses of wine per week    Comment: either a glass of wine a night or 2 beers-advised none within 48 hrs of surgery  . Drug use: Yes    Types: Marijuana    Comment: last 06/12/16- advised none within 48 hrs of surgery    ROS Constitutional: Denies fever, chills, weight loss/gain, headaches, insomnia,  night sweats, and change in appetite. Does c/o fatigue. Eyes: Denies redness, blurred vision, diplopia, discharge, itchy, watery eyes.  ENT: Denies discharge, congestion, post nasal drip, epistaxis, sore throat, earache, hearing loss, dental pain, Tinnitus, Vertigo, Sinus pain, snoring.  Cardio: Denies chest pain, palpitations, irregular heartbeat, syncope, dyspnea, diaphoresis, orthopnea, PND, claudication, edema Respiratory: denies cough, dyspnea, DOE, pleurisy, hoarseness, laryngitis, wheezing.  Gastrointestinal: Denies dysphagia, heartburn, reflux, water brash, pain, cramps, nausea, vomiting, bloating, diarrhea, constipation, hematemesis, melena, hematochezia, jaundice, hemorrhoids Genitourinary: Denies dysuria, frequency, urgency, nocturia, hesitancy, discharge, hematuria, flank pain Breast: Breast lumps, nipple discharge,  bleeding.  Musculoskeletal: Denies arthralgia, myalgia, stiffness, Jt. Swelling, pain, limp, and strain/sprain. Denies falls. Skin: Denies puritis, rash, hives, warts, acne, eczema, changing in skin lesion Neuro: No weakness, tremor, incoordination, spasms, paresthesia, pain Psychiatric: Denies confusion, memory loss, sensory loss. Denies Depression. Endocrine: Denies change in weight, skin, hair change, nocturia, and paresthesia, diabetic polys, visual blurring, hyper / hypo glycemic episodes.  Heme/Lymph: No excessive bleeding, bruising, enlarged lymph nodes.  Physical Exam  BP 116/74   Pulse 60   Temp (!) 97.5 F (36.4 C)   Resp 16   Ht 5\' 6"  (1.676 m)   Wt 163 lb 12.8 oz (74.3 kg)   BMI 26.44 kg/m   General Appearance: Well nourished, well groomed and in no apparent distress.  Eyes: PERRLA, EOMs, conjunctiva no swelling or erythema, normal fundi and vessels. Sinuses: No frontal/maxillary tenderness ENT/Mouth: EACs patent / TMs  nl. Nares clear without erythema, swelling, mucoid exudates. Oral hygiene is good. No erythema, swelling, or exudate. Tongue normal, non-obstructing. Tonsils not swollen or erythematous. Hearing normal.  Neck: Supple, thyroid not palpable. No bruits, nodes or JVD. Respiratory: Respiratory effort normal.  BS equal and clear bilateral without rales, rhonci, wheezing or stridor. Cardio: Heart sounds are normal with regular rate and rhythm and no murmurs, rubs or gallops. Peripheral pulses are normal and equal bilaterally without edema. No aortic or femoral bruits. Chest: symmetric with normal excursions and percussion. Breasts: Symmetric, without lumps, nipple discharge, retractions, or fibrocystic changes.  Abdomen: Flat, soft with bowel sounds active. Nontender, no guarding, rebound, hernias, masses, or organomegaly.  Lymphatics: Non tender without lymphadenopathy.  Genitourinary:  Musculoskeletal: Full ROM all peripheral extremities, joint stability, 5/5  strength, and normal gait. Skin: Warm and dry without rashes, lesions, cyanosis, clubbing or  ecchymosis.  Neuro: Cranial nerves intact, reflexes equal bilaterally. Normal muscle tone, no cerebellar symptoms. Sensation intact.  Pysch: Alert and oriented X 3, normal affect, Insight and Judgment appropriate.   Assessment and Plan  1. Annual Preventative Screening Examination  2. Essential hypertension  - EKG 12-Lead - Urinalysis, Routine w reflex microscopic - Microalbumin / creatinine urine ratio - CBC with Differential/Platelet - COMPLETE METABOLIC PANEL WITH GFR - Magnesium - TSH  3. Hyperlipidemia, mixed  - EKG 12-Lead - Lipid panel - TSH  4. Abnormal glucose  - EKG 12-Lead - Hemoglobin A1c - Insulin, random  5. Vitamin D deficiency  - VITAMIN D  25 Hydroxyl  6. Prediabetes  - EKG 12-Lead - Hemoglobin A1c - Insulin, random  7. Gastroesophageal reflux disease  - CBC with Differential/Platelet  8. Chronic obstructive pulmonary disease, unspecified COPD type ([place])   9. Screening for colorectal cancer  - POC Hemoccult Bld/Stl (3-Cd Home Screen); Future  10. Screening for ischemic heart disease  - EKG 12-Lead  11. Smoker  - EKG 12-Lead  12. FHx: heart disease  - EKG 12-Lead  13. Aortic atherosclerosis by Abd CT scan (HCC)  - EKG 12-Lead  14. Medication management  - Urinalysis, Routine w reflex microscopic - Microalbumin / creatinine urine ratio - CBC with Differential/Platelet - COMPLETE METABOLIC PANEL WITH GFR - Magnesium - Lipid panel - TSH - Hemoglobin A1c - Insulin, random - VITAMIN D 25 Hydroxyl      Patient was counseled in prudent diet to achieve/maintain BMI less than 25 for weight control, BP monitoring, regular exercise and medications. Discussed med's effects and SE's. Screening labs and tests as requested with regular follow-up as recommended. Over 40 minutes of exam, counseling, chart review and high complex critical decision  making was performed.

## 2018-01-08 NOTE — Patient Instructions (Signed)

## 2018-01-09 LAB — VITAMIN D 25 HYDROXY (VIT D DEFICIENCY, FRACTURES): Vit D, 25-Hydroxy: 84 ng/mL (ref 30–100)

## 2018-01-09 LAB — INSULIN, RANDOM: INSULIN: 3.9 u[IU]/mL (ref 2.0–19.6)

## 2018-01-09 LAB — COMPLETE METABOLIC PANEL WITH GFR
AG RATIO: 1.7 (calc) (ref 1.0–2.5)
ALBUMIN MSPROF: 4.5 g/dL (ref 3.6–5.1)
ALKALINE PHOSPHATASE (APISO): 76 U/L (ref 33–130)
ALT: 12 U/L (ref 6–29)
AST: 19 U/L (ref 10–35)
BILIRUBIN TOTAL: 0.5 mg/dL (ref 0.2–1.2)
BUN: 15 mg/dL (ref 7–25)
CHLORIDE: 102 mmol/L (ref 98–110)
CO2: 31 mmol/L (ref 20–32)
Calcium: 10 mg/dL (ref 8.6–10.4)
Creat: 0.79 mg/dL (ref 0.50–0.99)
GFR, Est African American: 90 mL/min/{1.73_m2} (ref >=60)
GFR, Est Non African American: 77 mL/min/{1.73_m2} (ref >=60)
Globulin: 2.6 g/dL (calc) (ref 1.9–3.7)
Glucose, Bld: 94 mg/dL (ref 65–99)
POTASSIUM: 5.8 mmol/L — AB (ref 3.5–5.3)
Sodium: 140 mmol/L (ref 135–146)
Total Protein: 7.1 g/dL (ref 6.1–8.1)

## 2018-01-09 LAB — CBC WITH DIFFERENTIAL/PLATELET
BASOS ABS: 38 {cells}/uL (ref 0–200)
Basophils Relative: 0.6 %
Eosinophils Absolute: 141 cells/uL (ref 15–500)
Eosinophils Relative: 2.2 %
HCT: 41.2 % (ref 35.0–45.0)
HEMOGLOBIN: 13.8 g/dL (ref 11.7–15.5)
Lymphs Abs: 2502 cells/uL (ref 850–3900)
MCH: 26.6 pg — AB (ref 27.0–33.0)
MCHC: 33.5 g/dL (ref 32.0–36.0)
MCV: 79.5 fL — ABNORMAL LOW (ref 80.0–100.0)
MONOS PCT: 8 %
MPV: 10.9 fL (ref 7.5–12.5)
NEUTROS ABS: 3206 {cells}/uL (ref 1500–7800)
Neutrophils Relative %: 50.1 %
PLATELETS: 266 10*3/uL (ref 140–400)
RBC: 5.18 10*6/uL — ABNORMAL HIGH (ref 3.80–5.10)
RDW: 13.9 % (ref 11.0–15.0)
TOTAL LYMPHOCYTE: 39.1 %
WBC mixed population: 512 cells/uL (ref 200–950)
WBC: 6.4 10*3/uL (ref 3.8–10.8)

## 2018-01-09 LAB — LIPID PANEL
Cholesterol: 240 mg/dL — ABNORMAL HIGH (ref <200)
HDL: 93 mg/dL (ref >50)
LDL Cholesterol (Calc): 132 mg/dL (calc) — ABNORMAL HIGH
Non-HDL Cholesterol (Calc): 147 mg/dL (calc) — ABNORMAL HIGH (ref <130)
Total CHOL/HDL Ratio: 2.6 (calc) (ref <5.0)
Triglycerides: 54 mg/dL (ref <150)

## 2018-01-09 LAB — URINALYSIS, ROUTINE W REFLEX MICROSCOPIC
BILIRUBIN URINE: NEGATIVE
Glucose, UA: NEGATIVE
Hgb urine dipstick: NEGATIVE
KETONES UR: NEGATIVE
Leukocytes, UA: NEGATIVE
Nitrite: NEGATIVE
Protein, ur: NEGATIVE
Specific Gravity, Urine: 1.008 (ref 1.001–1.03)
pH: 7.5 (ref 5.0–8.0)

## 2018-01-09 LAB — HEMOGLOBIN A1C
EAG (MMOL/L): 5.8 (calc)
Hgb A1c MFr Bld: 5.3 % of total Hgb (ref <5.7)
MEAN PLASMA GLUCOSE: 105 (calc)

## 2018-01-09 LAB — TSH: TSH: 0.58 m[IU]/L (ref 0.40–4.50)

## 2018-01-09 LAB — MICROALBUMIN / CREATININE URINE RATIO
CREATININE, URINE: 29 mg/dL (ref 20–275)
Microalb Creat Ratio: 17 mcg/mg creat (ref <30)
Microalb, Ur: 0.5 mg/dL

## 2018-01-09 LAB — MAGNESIUM: MAGNESIUM: 2 mg/dL (ref 1.5–2.5)

## 2018-01-13 ENCOUNTER — Telehealth: Payer: Self-pay | Admitting: Cardiology

## 2018-01-13 ENCOUNTER — Ambulatory Visit: Payer: PPO

## 2018-01-13 NOTE — Telephone Encounter (Signed)
Called to reschedule due to pt not feeling well. Scheduled for 7/8 at 11

## 2018-01-13 NOTE — Telephone Encounter (Signed)
New message   Please call to reschedule appt.

## 2018-01-17 ENCOUNTER — Other Ambulatory Visit: Payer: Self-pay | Admitting: Pharmacist

## 2018-01-17 MED ORDER — ALIROCUMAB 150 MG/ML ~~LOC~~ SOPN: 150.0000 mg | PEN_INJECTOR | SUBCUTANEOUS | 11 refills | Status: DC

## 2018-01-20 ENCOUNTER — Ambulatory Visit: Payer: PPO

## 2018-01-20 ENCOUNTER — Telehealth: Payer: Self-pay | Admitting: Cardiology

## 2018-01-20 NOTE — Telephone Encounter (Signed)
Did not need this encounter °

## 2018-01-23 ENCOUNTER — Other Ambulatory Visit: Payer: Self-pay | Admitting: Pharmacist

## 2018-01-23 MED ORDER — EVOLOCUMAB 140 MG/ML ~~LOC~~ SOAJ: 140.0000 mg | SUBCUTANEOUS | 11 refills | Status: DC

## 2018-01-23 NOTE — Telephone Encounter (Signed)
Initial Rx sent to [facility] for Praluent in error. Patient insurance approved Repatha 140mg .  New Rx sent today to prefer pharmacy

## 2018-01-27 ENCOUNTER — Telehealth: Payer: Self-pay

## 2018-01-27 NOTE — Telephone Encounter (Signed)
Pt came by office while care guide was in a meeting. Called back to reschedule, left message

## 2018-02-04 ENCOUNTER — Ambulatory Visit: Payer: PPO | Admitting: Pulmonary Disease

## 2018-02-05 ENCOUNTER — Telehealth: Payer: Self-pay | Admitting: Pharmacist

## 2018-02-05 NOTE — Telephone Encounter (Signed)
Patient mailed [employer] safetyNey directly to foundation.  Will follow up with [employer] tomorrow and follow up with patient as needed.

## 2018-02-06 NOTE — Telephone Encounter (Signed)
Rescheduled apt for 7/29 at 11

## 2018-02-10 ENCOUNTER — Ambulatory Visit: Payer: PPO

## 2018-02-10 ENCOUNTER — Telehealth: Payer: Self-pay

## 2018-02-10 NOTE — Telephone Encounter (Signed)
Called pt to reschedule apt. Pt says she has started smoking more and no longer feels as though she is ready to quit. Informed pt that I would like to continue working with her on her other health goals, pt says she has number and will reach out when she is ready.

## 2018-02-11 ENCOUNTER — Ambulatory Visit: Payer: PPO | Admitting: Pulmonary Disease

## 2018-02-11 ENCOUNTER — Encounter: Payer: Self-pay | Admitting: Pulmonary Disease

## 2018-02-11 VITALS — BP 126/72 | HR 79 | Ht 65.0 in | Wt 164.0 lb

## 2018-02-11 DIAGNOSIS — Z79899 Other long term (current) drug therapy: Secondary | ICD-10-CM | POA: Diagnosis not present

## 2018-02-11 DIAGNOSIS — J449 Chronic obstructive pulmonary disease, unspecified: Secondary | ICD-10-CM | POA: Diagnosis not present

## 2018-02-11 DIAGNOSIS — R5383 Other fatigue: Secondary | ICD-10-CM

## 2018-02-11 DIAGNOSIS — K219 Gastro-esophageal reflux disease without esophagitis: Secondary | ICD-10-CM | POA: Diagnosis not present

## 2018-02-11 DIAGNOSIS — F17200 Nicotine dependence, unspecified, uncomplicated: Secondary | ICD-10-CM | POA: Diagnosis not present

## 2018-02-11 MED ORDER — TIOTROPIUM BROMIDE MONOHYDRATE 1.25 MCG/ACT IN AERS: 2.0000 | INHALATION_SPRAY | Freq: Every day | RESPIRATORY_TRACT | 0 refills | Status: DC

## 2018-02-11 MED ORDER — TIOTROPIUM BROMIDE MONOHYDRATE 2.5 MCG/ACT IN AERS: 2.0000 | INHALATION_SPRAY | Freq: Every day | RESPIRATORY_TRACT | 5 refills | Status: DC

## 2018-02-11 NOTE — Assessment & Plan Note (Signed)
Reviewed extensively medication technique with patient.  Confirm patient is taking Spiriva Respimat.  Unfortunately patient was not using this inhaler correctly.  Patient instructed on how to use the device in office.  Clinical staff observed patient use device from sample.

## 2018-02-11 NOTE — Progress Notes (Signed)
@Patient  ID: [name], female    DOB: 10-27-1950, 67 y.o.   MRN: [id number]  Chief Complaint  Patient presents with  . Follow-up    COPD fu with spiriva     Referring provider: Unk Pinto, MD  HPI: 67 year old smoker (30 pack years) with a history of hypertension, diabetes, hyperlipidemia, allergic rhinitis.  May/19 pulmonary function testing showing mild obstruction without bronchodilator response, normal lung volumes and slightly decreased DLCO.  Patient initially referred to our office on 12/12/2017 for dyspnea as well as coughing. Patient of Dr. Lamonte Sakai  Current smoker (30 pack years)  Recent [facility] Encounters:   ROV 12/12/17 --patient follows up today for her dyspnea.  History of tobacco use.  She underwent pulmonary function testing on 12/11/2017.  This shows evidence for mild obstruction without a bronchodilator response, normal lung volumes and a slightly decreased diffusion capacity that does not correct to the normal range when adjusted for alveolar volume. She feel some dyspnea. She is doing a lot of coughing, seems to be impacted by poor GERD control. She is on [place], mucinex, flonase, singulair.  Plan: Emphasized smoking cessation, cut down to 7 cigarettes by her next visit, trial her on Spiriva, continue omeprazole and Zantac  Tests:   12/11/2017-pulmonary function test- ratio 71, FEV1 95, no significant bronchodilator response DLCO 71  Imaging:  11/18/2017-chest x-ray- COPD changes with minimal left basilar atelectasis, mild hyperinflation  Cardiac:  04/03/2017-echocardiogram-LV ejection fraction 55 to 60%,  Labs:   Micro:   Chart Review:    02/11/18 OV 67 year old patient seen for follow-up visit today.  Patient was recently tried on Spiriva at last office visit.  Patient reports that her breathing is been about the same.  Patient does admit that she is only taking her Spiriva about 3 times a week and only 1 puff at these times.  Patient is  still currently smoking 10 cigarettes a day.  She has not reduced any since her last office visit.  MMRC - Breathlessness Score 2 - on level ground, I walk slower than people of the same age because of breathlessness, or have to stop for breathe when walking to my own pace  Patient does report that she snores often has been feeling like she is been more fatigued during the day.  Patient denies feeling like she can go right back to sleep in the morning.  Patient reports that she sleeps consistently between 9:30 PM to 6 AM.  Without interruptions.  Family reports that snoring.  she denies sleep walking, sleep talking, bruxism, or nightmares.  There is no history of restless legs.  He denies sleep hallucinations, sleep paralysis, or cataplexy.    Allergies  Allergen Reactions  . Enalapril Swelling    Caused lips to swell and bloating.  Marland Kitchen No Known Allergies     Immunization History  Administered Date(s) Administered  . Influenza, High Dose Seasonal PF 04/10/2017  . PPD Test 09/21/2014  . Pneumococcal Conjugate-13 10/22/2017  . Pneumococcal Polysaccharide-23 02/07/2011, 01/26/2016  . Tdap 02/07/2011     Past Medical History:  Diagnosis Date  . Allergy    enviromental  . Anxiety   . Arthritis    osteoarthritis  . COPD (chronic obstructive pulmonary disease) (HCC)    mild  . Diabetes mellitus without complication ([place])    controlled by diet  . Environmental allergies   . Hepatitis    hepatitis A  . Hyperlipidemia   . Hypertension   . Mixed hyperlipidemia 06/15/2013  .  PONV (postoperative nausea and vomiting)    second surgery no problems last surgery  . Urgency of urination    Takes oxybutynin; under control at this time    Tobacco History: Social History   Tobacco Use  Smoking Status Current Every Day Smoker  . Packs/day: 0.50  . Years: 30.00  . Pack years: 15.00  . Types: Cigarettes  . Start date: 1985  Smokeless Tobacco Never Used  Tobacco Comment   10  cigarettes/day 02/11/2018   Ready to quit: Yes Counseling given: Yes Comment: 10 cigarettes/day 02/11/2018 Discussed extensively with patient need to stop smoking.  Have also reviewed with patient how FEV1 is affected when you continue to smoke.  Explained thoroughly to the patient that she needs to consistently work to decrease smoking so that we can adequately manage her respiratory status.  Patient agreed.  Patient to follow reduce to quit method.  Goal to be down to 5 to 6 cigarettes a day at next visit.  Patient reports this will be a major lifestyle changes many of her friends smoke.   Outpatient Encounter Medications as of 02/11/2018  Medication Sig  . Albuterol Sulfate (PROAIR RESPICLICK) 151 (90 Base) MCG/ACT AEPB Inhale 2 puffs into the lungs every 4 (four) hours as needed.  Marland Kitchen amLODipine (NORVASC) 2.5 MG tablet Take 1 tablet (2.5 mg total) by mouth 2 (two) times daily.  Marland Kitchen aspirin 81 MG tablet Take 81 mg by mouth daily.  . bisoprolol-hydrochlorothiazide (ZIAC) 10-6.25 MG tablet Take 0.5 tablets by mouth daily.  Marland Kitchen buPROPion (WELLBUTRIN XL) 150 MG 24 hr tablet Take 1 tablet (150 mg total) by mouth every morning.  . Cholecalciferol (VITAMIN D3) 5000 UNITS TABS Take 5,000 Units by mouth daily.  Marland Kitchen dextromethorphan-guaiFENesin (MUCINEX DM) 30-600 MG 12hr tablet Take 1 tablet by mouth 2 (two) times daily.  . Evolocumab (REPATHA SURECLICK) 761 MG/ML SOAJ Inject 140 mg into the skin every 14 (fourteen) days.  . fluticasone (FLONASE) 50 MCG/ACT nasal spray 1 Spray each nostril daily  . losartan (COZAAR) 100 MG tablet TAKE 1 TABLET BY MOUTH DAILY FOR BLOOD PRESSURE  . methylcellulose oral powder Take by mouth 3 (three) times daily as needed.  . montelukast (SINGULAIR) 10 MG tablet TAKE 1 TABLET(10 MG) BY MOUTH DAILY  . Multiple Vitamins-Minerals (MULTI COMPLETE PO) Take 1 capsule by mouth daily.  Marland Kitchen NIFEdipine (PROCARDIA XL/ADALAT-CC) 60 MG 24 hr tablet TAKE 1 TABLET BY MOUTH EVERY NIGHT FOR BLOOD  PRESSURE  . Omega-3 Fatty Acids (FISH OIL) 1000 MG CAPS Take 1 capsule by mouth daily.  Marland Kitchen omeprazole (PRILOSEC) 20 MG capsule TAKE 1 CAPSULE(20 MG) BY MOUTH EVERY DAY  . ranitidine (ZANTAC) 300 MG tablet TAKE 1 TABLET BY MOUTH AT NIGHT TIME FOR 2 WEEKS, THEN TAKE AS NEEDED  . rosuvastatin (CRESTOR) 5 MG tablet SEE NOTES  . clonazePAM (KLONOPIN) 0.5 MG tablet TAKE 1/2-1 TABLET BY MOUTH AT BEDTIME FOR SLEEP  . Tiotropium Bromide Monohydrate (SPIRIVA RESPIMAT) 1.25 MCG/ACT AERS Inhale 2 puffs into the lungs daily.  . Tiotropium Bromide Monohydrate (SPIRIVA RESPIMAT) 2.5 MCG/ACT AERS Inhale 2 puffs into the lungs daily.  . [DISCONTINUED] budesonide-formoterol (SYMBICORT) 160-4.5 MCG/ACT inhaler Inhale 2 puffs into the lungs 2 (two) times daily. (Patient not taking: Reported on 02/11/2018)   No facility-administered encounter medications on file as of 02/11/2018.      Review of Systems  Review of Systems  Constitutional: Positive for fatigue. Negative for chills, fever and unexpected weight change.  HENT: Negative for congestion,  ear pain, postnasal drip, rhinorrhea, sinus pressure, sinus pain, sneezing and sore throat.   Respiratory: Positive for shortness of breath (occasionally ). Negative for cough, chest tightness and wheezing.   Cardiovascular: Negative for chest pain and palpitations.  Gastrointestinal: Negative for blood in stool, diarrhea, nausea and vomiting.  Genitourinary: Negative for dysuria, frequency and urgency.  Musculoskeletal: Negative for arthralgias.  Skin: Negative for color change.  Allergic/Immunologic: Negative for environmental allergies and food allergies.  Neurological: Negative for dizziness, light-headedness and headaches.  Psychiatric/Behavioral: Negative for dysphoric mood. The patient is not nervous/anxious.       Physical Exam  BP 126/72   Pulse 79   Ht 5\' 5"  (1.651 m)   Wt 164 lb (74.4 kg)   SpO2 96%   BMI 27.29 kg/m   Wt Readings from Last 5  Encounters:  02/11/18 164 lb (74.4 kg)  01/08/18 163 lb 12.8 oz (74.3 kg)  12/12/17 165 lb (74.8 kg)  12/02/17 164 lb (74.4 kg)  10/22/17 168 lb (76.2 kg)     Physical Exam  Constitutional: She is oriented to person, place, and time and well-developed, well-nourished, and in no distress. No distress.  HENT:  Head: Normocephalic and atraumatic.  Right Ear: Hearing, tympanic membrane, external ear and ear canal normal.  Left Ear: Hearing, tympanic membrane, external ear and ear canal normal.  Nose: Nose normal. Right sinus exhibits no maxillary sinus tenderness and no frontal sinus tenderness. Left sinus exhibits no maxillary sinus tenderness and no frontal sinus tenderness.  Mouth/Throat: Uvula is midline and oropharynx is clear and moist. No oropharyngeal exudate.  Eyes: Pupils are equal, round, and reactive to light.  Neck: Normal range of motion. Neck supple. No JVD present.  Cardiovascular: Normal rate, regular rhythm and normal heart sounds.  Pulmonary/Chest: Effort normal and breath sounds normal. No accessory muscle usage. No respiratory distress. She has no decreased breath sounds. She has no wheezes. She has no rhonchi.  Abdominal: Soft. Bowel sounds are normal. There is no tenderness.  Musculoskeletal: Normal range of motion. She exhibits no edema.  Lymphadenopathy:    She has no cervical adenopathy.  Neurological: She is alert and oriented to person, place, and time. Gait normal.  Skin: Skin is warm and dry. She is not diaphoretic. No erythema.  Psychiatric: Mood, memory, affect and judgment normal.  Nursing note and vitals reviewed.    Lab Results:  CBC    Component Value Date/Time   WBC 6.4 01/08/2018 0954   RBC 5.18 (H) 01/08/2018 0954   HGB 13.8 01/08/2018 0954   HCT 41.2 01/08/2018 0954   PLT 266 01/08/2018 0954   MCV 79.5 (L) 01/08/2018 0954   MCH 26.6 (L) 01/08/2018 0954   MCHC 33.5 01/08/2018 0954   RDW 13.9 01/08/2018 0954   LYMPHSABS 2,502 01/08/2018  0954   MONOABS 496 12/18/2016 1225   EOSABS 141 01/08/2018 0954   BASOSABS 38 01/08/2018 0954    BMET    Component Value Date/Time   NA 140 01/08/2018 0954   K 5.8 (H) 01/08/2018 0954   CL 102 01/08/2018 0954   CO2 31 01/08/2018 0954   GLUCOSE 94 01/08/2018 0954   BUN 15 01/08/2018 0954   CREATININE 0.79 01/08/2018 0954   CALCIUM 10.0 01/08/2018 0954   GFRNONAA 77 01/08/2018 0954   GFRAA 90 01/08/2018 0954    BNP No results found for: BNP  ProBNP No results found for: PROBNP  Imaging: No results found.   Assessment & Plan:  Pleasant 67 year old patient seen for follow-up visit today.  Discussed extensively with patient why she needs to take Spiriva 2 puffs daily.  Unfortunately after much discussion with the patient we have realized that patient was only taking her Spiriva as needed about 3 times a week.  She also was not turning and clicking the mechanisms that she was actually not receiving any medication. Patient to use that 2 puffs daily no matter what.  Patient agrees.  Patient to follow-up with our office if symptoms are poorly managed on this medication.  We can adjust the dose to 2.5 that puts needed.  I hope that patient simply needs the 1.25.   Emphasized the importance of patient stopping smoking.  Smoking cessation resources provided to patient.  Encouraged her to do reduced to quit.  Patient agrees to be down to 5 to 6 cigarettes at next office visit.  COPD (chronic obstructive pulmonary disease) (HCC) Continue spiriva 1.25 >>>sample provided today  >>>2 puffs daily  >>>do this no matter what   We recommend that you stop smoking Continue to use reduced to quit method goal to be down to 5 to 6 cigarettes a day at next office visit  1 800 QUIT NOW  >>> Patient to call this resource and utilize it to help support her quit smoking >>> Keep up your hard work with stopping smoking  You can also contact the [facility] >>>For smoking cessation  classes call [phone]  We do not recommend using e-cigarettes as a form of stopping smoking  Note your daily symptoms > remember "red flags" for COPD:   >>>Increase in cough >>>increase in sputum production >>>increase in shortness of breath or activity  intolerance.   If you notice these symptoms, please call the office to be seen.   Follow up in 4 months   GERD (gastroesophageal reflux disease) Continue Zantac Continue omeprazole   Current smoker  We recommend that you stop smoking Continue to use reduced to quit method goal to be down to 5 to 6 cigarettes a day at next office visit  1 [place]  >>> Patient to call this resource and utilize it to help support her quit smoking >>> Keep up your hard work with stopping smoking  You can also contact the [facility] >>>For smoking cessation classes call [phone]  We do not recommend using e-cigarettes as a form of stopping smoking  Note your daily symptoms > remember "red flags" for COPD:   >>>Increase in cough >>>increase in sputum production >>>increase in shortness of breath or activity  intolerance.   If you notice these symptoms, please call the office to be seen.   Follow up in 4 months   Medication management Reviewed extensively medication technique with patient.  Confirm patient is taking Spiriva Respimat.  Unfortunately patient was not using this inhaler correctly.  Patient instructed on how to use the device in office.  Clinical staff observed patient use device from sample.     Lauraine Rinne, NP 02/11/2018

## 2018-02-11 NOTE — Assessment & Plan Note (Signed)
Continue Zantac Continue omeprazole

## 2018-02-11 NOTE — Assessment & Plan Note (Signed)
  We recommend that you stop smoking Continue to use reduced to quit method goal to be down to 5 to 6 cigarettes a day at next office visit  1 [place]  >>> Patient to call this resource and utilize it to help support her quit smoking >>> Keep up your hard work with stopping smoking  You can also contact the [facility] >>>For smoking cessation classes call [phone]  We do not recommend using e-cigarettes as a form of stopping smoking  Note your daily symptoms > remember "red flags" for COPD:   >>>Increase in cough >>>increase in sputum production >>>increase in shortness of breath or activity  intolerance.   If you notice these symptoms, please call the office to be seen.   Follow up in 4 months

## 2018-02-11 NOTE — Assessment & Plan Note (Signed)
Continue spiriva 1.25 >>>sample provided today  >>>2 puffs daily  >>>do this no matter what   We recommend that you stop smoking Continue to use reduced to quit method goal to be down to 5 to 6 cigarettes a day at next office visit  1 800 QUIT NOW  >>> Patient to call this resource and utilize it to help support her quit smoking >>> Keep up your hard work with stopping smoking  You can also contact the [facility] >>>For smoking cessation classes call [phone]  We do not recommend using e-cigarettes as a form of stopping smoking  Note your daily symptoms > remember "red flags" for COPD:   >>>Increase in cough >>>increase in sputum production >>>increase in shortness of breath or activity  intolerance.   If you notice these symptoms, please call the office to be seen.   Follow up in 4 months

## 2018-02-11 NOTE — Patient Instructions (Addendum)
Continue spiriva 1.25 >>>sample provided today  >>>2 puffs daily  >>>do this no matter what   [facility]'s today - 13 >>>will order home sleep study   We recommend that you stop smoking Continue to use reduced to quit method goal to be down to 5 to 6 cigarettes a day at next office visit  1 800 QUIT NOW  >>> Patient to call this resource and utilize it to help support her quit smoking >>> Keep up your hard work with stopping smoking  You can also contact the [facility] >>>For smoking cessation classes call [phone]  We do not recommend using e-cigarettes as a form of stopping smoking  Note your daily symptoms > remember "red flags" for COPD:   >>>Increase in cough >>>increase in sputum production >>>increase in shortness of breath or activity  intolerance.   If you notice these symptoms, please call the office to be seen.     Follow up in 4 months     Please contact the office if your symptoms worsen or you have concerns that you are not improving.   Thank you for choosing [facility] for your healthcare, and for allowing [place] to partner with you on your healthcare journey. I am thankful to be able to provide care to you today.   Wyn Quaker FNP-C

## 2018-02-11 NOTE — Assessment & Plan Note (Signed)
[facility] today 13 Will order home sleep study to rule out obstructive sleep apnea TSH from primary care is been normal

## 2018-02-20 ENCOUNTER — Telehealth: Payer: Self-pay | Admitting: Pharmacist

## 2018-02-20 NOTE — Telephone Encounter (Signed)
Patient assistance approved until 07/15/2018.  [name] is to call and place order for 1st delivery.

## 2018-02-23 DIAGNOSIS — G4733 Obstructive sleep apnea (adult) (pediatric): Secondary | ICD-10-CM | POA: Diagnosis not present

## 2018-02-24 ENCOUNTER — Other Ambulatory Visit: Payer: Self-pay | Admitting: *Deleted

## 2018-02-24 DIAGNOSIS — G4733 Obstructive sleep apnea (adult) (pediatric): Secondary | ICD-10-CM

## 2018-02-24 DIAGNOSIS — R5383 Other fatigue: Secondary | ICD-10-CM

## 2018-02-25 ENCOUNTER — Telehealth: Payer: Self-pay | Admitting: Pulmonary Disease

## 2018-02-25 NOTE — Telephone Encounter (Signed)
02/23/2018- Home sleep study results- AHI 12.5 an hour, SaO2 low of 79% with an average of 87%.  I would recommend that patient start CPAP therapy 5-15 auto titrating.  With mask of choice with a follow-up in our office in 6 to 8 weeks.  Patient is also more than welcome to present to our office and we can discuss these results in person.  If she is hesitant to start therapy.  Wyn Quaker FNP

## 2018-02-25 NOTE — Telephone Encounter (Signed)
Called and spoke with patient, patient made aware of results. Patient requests to have a office visit to discuss in more detail. Appt made for Wednesday August 14th at 1130am. Nothing further needed at this time.

## 2018-02-25 NOTE — Progress Notes (Signed)
@Patient  ID: [name], female    DOB: [birth year], 67 y.o.   MRN: [id number]  Chief Complaint  Patient presents with  . Follow-up    Needs HST results     Referring provider: Unk Pinto, MD  HPI: 67 year old smoker (30 pack years). Followed in our office for mild obstruction on 11/2017 PFTs.  PMH: HTN, DMT2, Hyperlipidemia, AR Maintenance: Spiriva 1.25 Smoking Status/History:  Current smoker (30 pack years), smoking 7-10 cigarettes a day, willing to quit Pt of: Dr. Lamonte Sakai  Recent [place] [facility] Encounters:   ROV 12/12/17 -- [place] patient follows up today for her dyspnea.  History of tobacco use.  She underwent pulmonary function testing on 12/11/2017.  This shows evidence for mild obstruction without a bronchodilator response, normal lung volumes and a slightly decreased diffusion capacity that does not correct to the normal range when adjusted for alveolar volume. She feel some dyspnea. She is doing a lot of coughing, seems to be impacted by poor GERD control. She is on [place], mucinex, flonase, singulair.  Plan: Emphasized smoking cessation, cut down to 7 cigarettes by her next visit, trial her on Spiriva, continue omeprazole and Zantac  02/11/18-OV-BM 67 year old patient seen for follow-up visit today.  Patient was recently tried on Spiriva at last office visit.  Patient reports that her breathing is been about the same.  Patient does admit that she is only taking her Spiriva about 3 times a week and only 1 puff at these times.  Patient is still currently smoking 10 cigarettes a day.  She has not reduced any since her last office visit.  mMRC 2.  Patient reports she is been feeling more fatigued throughout the day.  Patient reports that she snores.. Plan: Smoking sensation, educated on Spiriva as well as instructed patient on how to take it, take Spiriva daily, home sleep study  Tests:  12/11/2017-pulmonary function test- post bronc ratio 77, FEV1 95, no significant  bronchodilator response DLCO 71 02/23/2018- Home sleep study results- AHI 12.5 an hour, SaO2 low of 79% with an average of 87%.   Imaging:  11/18/2017-chest x-ray- COPD changes with minimal left basilar atelectasis, mild hyperinflation  Cardiac:  04/03/2017-echocardiogram-LV ejection fraction 55 to 60%     Chart Review:    02/26/18 Office visit Patient presents office today to discuss home sleep study results.  Patient's home sleep study shows an AHI of 12.5 an hour.  Patient also has an average SaO2 of 87%, with an SaO2 low of 79%.  Patient would like to know her options as far as for treating her obstructive sleep apnea.  Patient reports the Spiriva 1.25 has been doing great with her.  She reports that this is helped with her breathing.  Unfortunately she is still smoking the same amount of cigarettes.  Patient is currently smoking half a pack a day.    Allergies  Allergen Reactions  . Enalapril Swelling    Caused lips to swell and bloating.  Marland Kitchen No Known Allergies     Immunization History  Administered Date(s) Administered  . Influenza, High Dose Seasonal PF 04/10/2017  . PPD Test 09/21/2014  . Pneumococcal Conjugate-13 10/22/2017  . Pneumococcal Polysaccharide-23 02/07/2011, 01/26/2016  . Tdap 02/07/2011  Patient needs flu vaccine  Past Medical History:  Diagnosis Date  . Allergy    enviromental  . Anxiety   . Arthritis    osteoarthritis  . COPD (chronic obstructive pulmonary disease) (HCC)    mild  . Diabetes mellitus without complication (  [place])    controlled by diet  . Environmental allergies   . Hepatitis    hepatitis A  . Hyperlipidemia   . Hypertension   . Mixed hyperlipidemia 06/15/2013  . PONV (postoperative nausea and vomiting)    second surgery no problems last surgery  . Urgency of urination    Takes oxybutynin; under control at this time    Tobacco History: Social History   Tobacco Use  Smoking Status Current Every Day Smoker  . Packs/day:  0.50  . Years: 30.00  . Pack years: 15.00  . Types: Cigarettes  . Start date: 1985  Smokeless Tobacco Never Used  Tobacco Comment   10 cigarettes/day 02/11/2018   Ready to quit: Not Answered Counseling given: Not Answered Comment: 10 cigarettes/day 02/11/2018  Smoking assessment and cessation counseling  Patient currently smoking: 0.5 ppd   I have advised the patient to quit/stop smoking as soon as possible due to high risk for multiple medical problems.  It will also be very difficult for [place] to manage patient's  respiratory symptoms and status if we continue to expose her lungs to a known irritant.  We do not advise e-cigarettes as a form of stopping smoking.  Patient is willing to quit smoking.  I have advised the patient that we can assist and have options of nicotine replacement therapy, provided smoking cessation education today, provided smoking cessation counseling, and provided cessation resources.  Patient is willing to try to reduce to quit method.  Follow-up next office visit office visit for assessment of smoking cessation.  Smoking cessation counseling advised for: 4 min   Outpatient Encounter Medications as of 02/26/2018  Medication Sig  . Albuterol Sulfate (PROAIR RESPICLICK) 992 (90 Base) MCG/ACT AEPB Inhale 2 puffs into the lungs every 4 (four) hours as needed.  Marland Kitchen amLODipine (NORVASC) 2.5 MG tablet Take 1 tablet (2.5 mg total) by mouth 2 (two) times daily.  Marland Kitchen aspirin 81 MG tablet Take 81 mg by mouth daily.  . bisoprolol-hydrochlorothiazide (ZIAC) 10-6.25 MG tablet Take 0.5 tablets by mouth daily.  Marland Kitchen buPROPion (WELLBUTRIN XL) 150 MG 24 hr tablet Take 1 tablet (150 mg total) by mouth every morning.  . Cholecalciferol (VITAMIN D3) 5000 UNITS TABS Take 5,000 Units by mouth daily.  Marland Kitchen dextromethorphan-guaiFENesin (MUCINEX DM) 30-600 MG 12hr tablet Take 1 tablet by mouth 2 (two) times daily.  . Evolocumab (REPATHA SURECLICK) 426 MG/ML SOAJ Inject 140 mg into the skin every  14 (fourteen) days.  . fluticasone (FLONASE) 50 MCG/ACT nasal spray 1 Spray each nostril daily  . losartan (COZAAR) 100 MG tablet TAKE 1 TABLET BY MOUTH DAILY FOR BLOOD PRESSURE  . methylcellulose oral powder Take by mouth 3 (three) times daily as needed.  . montelukast (SINGULAIR) 10 MG tablet TAKE 1 TABLET(10 MG) BY MOUTH DAILY  . Multiple Vitamins-Minerals (MULTI COMPLETE PO) Take 1 capsule by mouth daily.  Marland Kitchen NIFEdipine (PROCARDIA XL/ADALAT-CC) 60 MG 24 hr tablet TAKE 1 TABLET BY MOUTH EVERY NIGHT FOR BLOOD PRESSURE  . Omega-3 Fatty Acids (FISH OIL) 1000 MG CAPS Take 1 capsule by mouth daily.  Marland Kitchen omeprazole (PRILOSEC) 20 MG capsule TAKE 1 CAPSULE(20 MG) BY MOUTH EVERY DAY  . ranitidine (ZANTAC) 300 MG tablet TAKE 1 TABLET BY MOUTH AT NIGHT TIME FOR 2 WEEKS, THEN TAKE AS NEEDED  . Tiotropium Bromide Monohydrate (SPIRIVA RESPIMAT) 1.25 MCG/ACT AERS Inhale 2 puffs into the lungs daily.  . [DISCONTINUED] Tiotropium Bromide Monohydrate (SPIRIVA RESPIMAT) 1.25 MCG/ACT AERS Inhale 2 puffs into the  lungs daily.  . [DISCONTINUED] Tiotropium Bromide Monohydrate (SPIRIVA RESPIMAT) 2.5 MCG/ACT AERS Inhale 2 puffs into the lungs daily.  . clonazePAM (KLONOPIN) 0.5 MG tablet TAKE 1/2-1 TABLET BY MOUTH AT BEDTIME FOR SLEEP  . [DISCONTINUED] rosuvastatin (CRESTOR) 5 MG tablet SEE NOTES (Patient not taking: Reported on 02/26/2018)   No facility-administered encounter medications on file as of 02/26/2018.      Review of Systems  Review of Systems  Constitutional: Positive for fatigue. Negative for chills, fever and unexpected weight change.  HENT: Negative for congestion, ear pain and postnasal drip.   Respiratory: Negative for cough, chest tightness, shortness of breath and wheezing.   Cardiovascular: Negative for chest pain and palpitations.  Gastrointestinal: Negative for blood in stool, diarrhea, nausea and vomiting.  Genitourinary: Negative for dysuria, frequency and urgency.  Musculoskeletal:  Negative for arthralgias.  Skin: Negative for color change.  Allergic/Immunologic: Negative for environmental allergies and food allergies.  Neurological: Negative for dizziness, light-headedness and headaches.  Psychiatric/Behavioral: Negative for dysphoric mood. The patient is not nervous/anxious.        Physical Exam  BP 128/80   Pulse 60   Ht 5\' 1"  (1.549 m)   Wt 158 lb 8 oz (71.9 kg)   SpO2 98%   BMI 29.95 kg/m   Wt Readings from Last 5 Encounters:  02/26/18 158 lb 8 oz (71.9 kg)  02/11/18 164 lb (74.4 kg)  01/08/18 163 lb 12.8 oz (74.3 kg)  12/12/17 165 lb (74.8 kg)  12/02/17 164 lb (74.4 kg)     Physical Exam  Constitutional: She is oriented to person, place, and time and well-developed, well-nourished, and in no distress. No distress.  HENT:  Head: Normocephalic and atraumatic.  Right Ear: Hearing, tympanic membrane, external ear and ear canal normal.  Left Ear: Hearing, tympanic membrane, external ear and ear canal normal.  Nose: Nose normal. Right sinus exhibits no maxillary sinus tenderness and no frontal sinus tenderness. Left sinus exhibits no maxillary sinus tenderness and no frontal sinus tenderness.  Mouth/Throat: Uvula is midline and oropharynx is clear and moist. No oropharyngeal exudate.  Eyes: Pupils are equal, round, and reactive to light.  Neck: Normal range of motion. Neck supple. No JVD present.  Cardiovascular: Normal rate, regular rhythm and normal heart sounds.  Pulmonary/Chest: Effort normal and breath sounds normal. No accessory muscle usage. No respiratory distress. She has no decreased breath sounds. She has no wheezes. She has no rhonchi.  Abdominal: Soft. Bowel sounds are normal. There is no tenderness.  Musculoskeletal: Normal range of motion. She exhibits no edema.  Lymphadenopathy:    She has no cervical adenopathy.  Neurological: She is alert and oriented to person, place, and time. Gait normal.  Skin: Skin is warm and dry. She is not  diaphoretic. No erythema.  Psychiatric: Mood, memory, affect and judgment normal.  Nursing note and vitals reviewed.      Lab Results:  CBC    Component Value Date/Time   WBC 6.4 01/08/2018 0954   RBC 5.18 (H) 01/08/2018 0954   HGB 13.8 01/08/2018 0954   HCT 41.2 01/08/2018 0954   PLT 266 01/08/2018 0954   MCV 79.5 (L) 01/08/2018 0954   MCH 26.6 (L) 01/08/2018 0954   MCHC 33.5 01/08/2018 0954   RDW 13.9 01/08/2018 0954   LYMPHSABS 2,502 01/08/2018 0954   MONOABS 496 12/18/2016 1225   EOSABS 141 01/08/2018 0954   BASOSABS 38 01/08/2018 0954    BMET    Component Value Date/Time  NA 140 01/08/2018 0954   K 5.8 (H) 01/08/2018 0954   CL 102 01/08/2018 0954   CO2 31 01/08/2018 0954   GLUCOSE 94 01/08/2018 0954   BUN 15 01/08/2018 0954   CREATININE 0.79 01/08/2018 0954   CALCIUM 10.0 01/08/2018 0954   GFRNONAA 77 01/08/2018 0954   GFRAA 90 01/08/2018 0954    BNP No results found for: BNP  ProBNP No results found for: PROBNP  Imaging: No results found.   Assessment & Plan:   Pleasant 67 year old patient seen office visit today.  Patient to proceed forward with CPAP therapy for mild obstructive sleep apnea.  We will bring patient back in 6 to 8 weeks to show 30-day compliance using CPAP.  We will continue Spiriva.  Sample provided today.  Emphasized importance the patient of stopping smoking.  Emphasized patient working to be down to 7 cigarettes daily by next appointment.  OSA (obstructive sleep apnea) Will order CPAP from DME company >>>Will need follow-up with our office in about 8 weeks to show 30 days compliance using CPAP >>> CPAP educational information listed below   COPD GOLD 0  Continue Spiriva 2 puffs daily  Obtain flu vaccine when available  We recommend that you stop smoking Resources: 1 800 QUIT NOW  >>> Patient to call this resource and utilize it to help support her quit smoking >>> Keep up your hard work with stopping smoking  You  can also contact the [facility] >>>For smoking cessation classes call [phone]  We do not recommend using e-cigarettes as a form of stopping smoking   Current smoker  Obtain flu vaccine when available  We recommend that you stop smoking Resources: 1 [place]  >>> Patient to call this resource and utilize it to help support her quit smoking >>> Keep up your hard work with stopping smoking  You can also contact the [facility] >>>For smoking cessation classes call [phone]  We do not recommend using e-cigarettes as a form of stopping smoking   GERD (gastroesophageal reflux disease)  Take omeprazole 20 mg in the morning 30 minutes to an hour before eating or taking her other medications >>> Please read GERD information below as well as GERD diet information below      Lauraine Rinne, NP 02/26/2018

## 2018-02-26 ENCOUNTER — Encounter: Payer: Self-pay | Admitting: Pulmonary Disease

## 2018-02-26 ENCOUNTER — Ambulatory Visit (INDEPENDENT_AMBULATORY_CARE_PROVIDER_SITE_OTHER): Payer: PPO | Admitting: Pulmonary Disease

## 2018-02-26 VITALS — BP 128/80 | HR 60 | Ht 61.0 in | Wt 158.5 lb

## 2018-02-26 DIAGNOSIS — G4733 Obstructive sleep apnea (adult) (pediatric): Secondary | ICD-10-CM | POA: Insufficient documentation

## 2018-02-26 DIAGNOSIS — K219 Gastro-esophageal reflux disease without esophagitis: Secondary | ICD-10-CM | POA: Diagnosis not present

## 2018-02-26 DIAGNOSIS — F17210 Nicotine dependence, cigarettes, uncomplicated: Secondary | ICD-10-CM

## 2018-02-26 DIAGNOSIS — J449 Chronic obstructive pulmonary disease, unspecified: Secondary | ICD-10-CM | POA: Diagnosis not present

## 2018-02-26 DIAGNOSIS — F17200 Nicotine dependence, unspecified, uncomplicated: Secondary | ICD-10-CM

## 2018-02-26 MED ORDER — TIOTROPIUM BROMIDE MONOHYDRATE 1.25 MCG/ACT IN AERS: 2.0000 | INHALATION_SPRAY | Freq: Every day | RESPIRATORY_TRACT | 0 refills | Status: DC

## 2018-02-26 NOTE — Assessment & Plan Note (Signed)
  Obtain flu vaccine when available  We recommend that you stop smoking Resources: 1 800 QUIT NOW  >>> Patient to call this resource and utilize it to help support her quit smoking >>> Keep up your hard work with stopping smoking  You can also contact the [facility] >>>For smoking cessation classes call [phone]  We do not recommend using e-cigarettes as a form of stopping smoking

## 2018-02-26 NOTE — Assessment & Plan Note (Signed)
  Take omeprazole 20 mg in the morning 30 minutes to an hour before eating or taking her other medications >>> Please read GERD information below as well as GERD diet information below

## 2018-02-26 NOTE — Assessment & Plan Note (Addendum)
Continue Spiriva 2 puffs daily  Obtain flu vaccine when available  We recommend that you stop smoking Resources: 1 800 QUIT NOW  >>> Patient to call this resource and utilize it to help support her quit smoking >>> Keep up your hard work with stopping smoking  You can also contact the [facility] >>>For smoking cessation classes call [phone]  We do not recommend using e-cigarettes as a form of stopping smoking

## 2018-02-26 NOTE — Patient Instructions (Signed)
Will order CPAP from DME company >>>Will need follow-up with our office in about 8 weeks to show 30 days compliance using CPAP >>> CPAP educational information listed below  Continue Spiriva 2 puffs daily  Take omeprazole 20 mg in the morning 30 minutes to an hour before eating or taking her other medications >>> Please read GERD information below as well as GERD diet information below  Obtain flu vaccine when available  We recommend that you stop smoking Resources: 1 800 QUIT NOW  >>> Patient to call this resource and utilize it to help support her quit smoking >>> Keep up your hard work with stopping smoking  You can also contact the [facility] >>>For smoking cessation classes call [phone]  We do not recommend using e-cigarettes as a form of stopping smoking     Please contact the office if your symptoms worsen or you have concerns that you are not improving.   Thank you for choosing [facility] for your healthcare, and for allowing [place] to partner with you on your healthcare journey. I am thankful to be able to provide care to you today.   Wyn Quaker FNP-C     CPAP and BiPAP Information CPAP and BiPAP are methods of helping a person breathe with the use of air pressure. CPAP stands for "continuous positive airway pressure." BiPAP stands for "bi-level positive airway pressure." In both methods, air is blown through your nose or mouth and into your air passages to help you breathe well. CPAP and BiPAP use different amounts of pressure to blow air. With CPAP, the amount of pressure stays the same while you breathe in and out. With BiPAP, the amount of pressure is increased when you breathe in (inhale) so that you can take larger breaths. Your health care provider will recommend whether CPAP or BiPAP would be more helpful for you. Why are CPAP and BiPAP treatments used? CPAP or BiPAP can be helpful if you have:  Sleep apnea.  Chronic obstructive  pulmonary disease (COPD).  Heart failure.  Medical conditions that weaken the muscles of the chest including muscular dystrophy, or neurological diseases such as amyotrophic lateral sclerosis (ALS).  Other problems that cause breathing to be weak, abnormal, or difficult.  CPAP is most commonly used for obstructive sleep apnea (OSA) to keep the airways from collapsing when the muscles relax during sleep. How is CPAP or BiPAP administered? Both CPAP and BiPAP are provided by a small machine with a flexible plastic tube that attaches to a plastic mask. You wear the mask. Air is blown through the mask into your nose or mouth. The amount of pressure that is used to blow the air can be adjusted on the machine. Your health care provider will determine the pressure setting that should be used based on your individual needs. When should CPAP or BiPAP be used? In most cases, the mask only needs to be worn during sleep. Generally, the mask needs to be worn throughout the night and during any daytime naps. People with certain medical conditions may also need to wear the mask at other times when they are awake. Follow instructions from your health care provider about when to use the machine. What are some tips for using the mask?  Because the mask needs to be snug, some people feel trapped or closed-in (claustrophobic) when first using the mask. If you feel this way, you may need to get used to the mask. One way to do this is  by holding the mask loosely over your nose or mouth and then gradually applying the mask more snugly. You can also gradually increase the amount of time that you use the mask.  Masks are available in various types and sizes. Some fit over your mouth and nose while others fit over just your nose. If your mask does not fit well, talk with your health care provider about getting a different one.  If you are using a mask that fits over your nose and you tend to breathe through your mouth, a  chin strap may be applied to help keep your mouth closed.  The CPAP and BiPAP machines have alarms that may sound if the mask comes off or develops a leak.  If you have trouble with the mask, it is very important that you talk with your health care provider about finding a way to make the mask easier to tolerate. Do not stop using the mask. Stopping the use of the mask could have a negative impact on your health. What are some tips for using the machine?  Place your CPAP or BiPAP machine on a secure table or stand near an electrical outlet.  Know where the on/off switch is located on the machine.  Follow instructions from your health care provider about how to set the pressure on your machine and when you should use it.  Do not eat or drink while the CPAP or BiPAP machine is on. Food or fluids could get pushed into your lungs by the pressure of the CPAP or BiPAP.  Do not smoke. Tobacco smoke residue can damage the machine.  For home use, CPAP and BiPAP machines can be rented or purchased through home health care companies. Many different brands of machines are available. Renting a machine before purchasing may help you find out which particular machine works well for you.  Keep the CPAP or BiPAP machine and attachments clean. Ask your health care provider for specific instructions. Get help right away if:  You have redness or open areas around your nose or mouth where the mask fits.  You have trouble using the CPAP or BiPAP machine.  You cannot tolerate wearing the CPAP or BiPAP mask.  You have pain, discomfort, and bloating in your abdomen. Summary  CPAP and BiPAP are methods of helping a person breathe with the use of air pressure.  Both CPAP and BiPAP are provided by a small machine with a flexible plastic tube that attaches to a plastic mask.  If you have trouble with the mask, it is very important that you talk with your health care provider about finding a way to make the  mask easier to tolerate. This information is not intended to replace advice given to you by your health care provider. Make sure you discuss any questions you have with your health care provider. Document Released: 03/30/2004 Document Revised: 05/21/2016 Document Reviewed: 05/21/2016 Elsevier Interactive Patient Education  2017 [employer] for Gastroesophageal Reflux Disease, Adult When you have gastroesophageal reflux disease (GERD), the foods you eat and your eating habits are very important. Choosing the right foods can help ease your discomfort. What guidelines do I need to follow?  Choose fruits, vegetables, whole grains, and low-fat dairy products.  Choose low-fat meat, fish, and poultry.  Limit fats such as oils, salad dressings, butter, nuts, and avocado.  Keep a food diary. This helps you identify foods that cause symptoms.  Avoid foods that cause symptoms. These may be  different for everyone.  Eat small meals often instead of 3 large meals a day.  Eat your meals slowly, in a place where you are relaxed.  Limit fried foods.  Cook foods using methods other than frying.  Avoid drinking alcohol.  Avoid drinking large amounts of liquids with your meals.  Avoid bending over or lying down until 2-3 hours after eating. What foods are not recommended? These are some foods and drinks that may make your symptoms worse: Vegetables Tomatoes. Tomato juice. Tomato and spaghetti sauce. Chili peppers. Onion and garlic. Horseradish. Fruits Oranges, grapefruit, and lemon (fruit and juice). Meats High-fat meats, fish, and poultry. This includes hot dogs, ribs, ham, sausage, salami, and bacon. Dairy Whole milk and chocolate milk. Sour cream. Cream. Butter. Ice cream. Cream cheese. Drinks Coffee and tea. Bubbly (carbonated) drinks or energy drinks. Condiments Hot sauce. Barbecue sauce. Sweets/Desserts Chocolate and cocoa. Donuts. Peppermint and spearmint. Fats and  Oils High-fat foods. This includes [place] fries and potato chips. Other Vinegar. Strong spices. This includes black pepper, white pepper, red pepper, cayenne, curry powder, cloves, ginger, and chili powder. The items listed above may not be a complete list of foods and drinks to avoid. Contact your dietitian for more information. This information is not intended to replace advice given to you by your health care provider. Make sure you discuss any questions you have with your health care provider. Document Released: 01/01/2012 Document Revised: 12/08/2015 Document Reviewed: 05/06/2013 Elsevier Interactive Patient Education  2017 [place].  Gastroesophageal Reflux Disease, Adult Normally, food travels down the esophagus and stays in the stomach to be digested. If a person has gastroesophageal reflux disease (GERD), food and stomach acid move back up into the esophagus. When this happens, the esophagus becomes sore and swollen (inflamed). Over time, GERD can make small holes (ulcers) in the lining of the esophagus. Follow these instructions at home: Diet  Follow a diet as told by your doctor. You may need to avoid foods and drinks such as: ? Coffee and tea (with or without caffeine). ? Drinks that contain alcohol. ? Energy drinks and sports drinks. ? Carbonated drinks or sodas. ? Chocolate and cocoa. ? Peppermint and mint flavorings. ? Garlic and onions. ? Horseradish. ? Spicy and acidic foods, such as peppers, chili powder, curry powder, vinegar, hot sauces, and BBQ sauce. ? Citrus fruit juices and citrus fruits, such as oranges, lemons, and limes. ? Tomato-based foods, such as red sauce, chili, salsa, and pizza with red sauce. ? Fried and fatty foods, such as donuts, french fries, potato chips, and high-fat dressings. ? High-fat meats, such as hot dogs, rib eye steak, sausage, ham, and bacon. ? High-fat dairy items, such as whole milk, butter, and cream cheese.  Eat small meals  often. Avoid eating large meals.  Avoid drinking large amounts of liquid with your meals.  Avoid eating meals during the 2-3 hours before bedtime.  Avoid lying down right after you eat.  Do not exercise right after you eat. General instructions  Pay attention to any changes in your symptoms.  Take over-the-counter and prescription medicines only as told by your doctor. Do not take aspirin, ibuprofen, or other NSAIDs unless your doctor says it is okay.  Do not use any tobacco products, including cigarettes, chewing tobacco, and e-cigarettes. If you need help quitting, ask your doctor.  Wear loose clothes. Do not wear anything tight around your waist.  Raise (elevate) the head of your bed about 6 inches (15 cm).  Try to lower your stress. If you need help doing this, ask your doctor.  If you are overweight, lose an amount of weight that is healthy for you. Ask your doctor about a safe weight loss goal.  Keep all follow-up visits as told by your doctor. This is important. Contact a doctor if:  You have new symptoms.  You lose weight and you do not know why it is happening.  You have trouble swallowing, or it hurts to swallow.  You have wheezing or a cough that keeps happening.  Your symptoms do not get better with treatment.  You have a hoarse voice. Get help right away if:  You have pain in your arms, neck, jaw, teeth, or back.  You feel sweaty, dizzy, or light-headed.  You have chest pain or shortness of breath.  You throw up (vomit) and your throw up looks like blood or coffee grounds.  You pass out (faint).  Your poop (stool) is bloody or black.  You cannot swallow, drink, or eat. This information is not intended to replace advice given to you by your health care provider. Make sure you discuss any questions you have with your health care provider. Document Released: 12/19/2007 Document Revised: 12/08/2015 Document Reviewed: 10/27/2014 Elsevier Interactive  Patient Education  [employer].

## 2018-02-26 NOTE — Assessment & Plan Note (Signed)
Will order CPAP from DME company >>>Will need follow-up with our office in about 8 weeks to show 30 days compliance using CPAP >>> CPAP educational information listed below

## 2018-03-03 ENCOUNTER — Other Ambulatory Visit: Payer: Self-pay | Admitting: Cardiology

## 2018-03-13 DIAGNOSIS — G4733 Obstructive sleep apnea (adult) (pediatric): Secondary | ICD-10-CM | POA: Diagnosis not present

## 2018-03-19 ENCOUNTER — Telehealth: Payer: Self-pay | Admitting: *Deleted

## 2018-03-19 ENCOUNTER — Other Ambulatory Visit: Payer: Self-pay | Admitting: Internal Medicine

## 2018-03-19 ENCOUNTER — Telehealth: Payer: Self-pay | Admitting: Cardiology

## 2018-03-19 NOTE — Telephone Encounter (Signed)
New Message:   Patient returning a call for her Evolocumab (REPATHA SURECLICK) 791 MG/ML SOAJ

## 2018-03-19 NOTE — Telephone Encounter (Signed)
Patient calling to report Repatha 140mg  injection not received yet.   [facility] safetyNet approved. Phone number given (2nd time) to arrange delivery ASAP.

## 2018-03-19 NOTE — Telephone Encounter (Signed)
-----   Message from Lauraine Rinne, NP sent at 03/19/2018 10:51 AM EDT ----- Regarding: RE: [employer] Its unfortunate the patient is unable to be reached.  We can readdress at next office visit.  If they have attempted to reach her and mailed her letters and I am okay with not proceeding further.  Please ensure that they have tried to contact the patient via the telephone, email, letters release multiple forms of communication to try to reach her.  Wyn Quaker FNP  ----- Message ----- From: Amado Coe, RN Sent: 03/19/2018  10:36 AM EDT To: Lauraine Rinne, NP Subject: [employer]                                       Received an email from Christin Bach with [employer], multiple contacts made to patient and unable to reach her.  They are wondering how you would like to proceed.  Thank you  Serita Kyle

## 2018-03-19 NOTE — Telephone Encounter (Signed)
Thank you for addressing this.   Wyn Quaker FNP

## 2018-03-19 NOTE — Telephone Encounter (Signed)
I was able to contact patient via phone call.  She picked up her machine Thursday 03/13/18 and is going to start using it today Wednesday 03/19/18. This should be within the 31 days of use by her next appointment on 10.9.19.    Nothing further is needed

## 2018-03-20 ENCOUNTER — Ambulatory Visit (INDEPENDENT_AMBULATORY_CARE_PROVIDER_SITE_OTHER): Payer: PPO | Admitting: Internal Medicine

## 2018-03-20 VITALS — BP 182/84 | HR 72 | Temp 97.3°F | Resp 16 | Ht 66.0 in | Wt 159.4 lb

## 2018-03-20 DIAGNOSIS — D692 Other nonthrombocytopenic purpura: Secondary | ICD-10-CM | POA: Diagnosis not present

## 2018-03-20 DIAGNOSIS — Z79899 Other long term (current) drug therapy: Secondary | ICD-10-CM | POA: Diagnosis not present

## 2018-03-20 LAB — CBC WITH DIFFERENTIAL/PLATELET
BASOS ABS: 29 {cells}/uL (ref 0–200)
Basophils Relative: 0.5 %
EOS PCT: 1.2 %
Eosinophils Absolute: 70 cells/uL (ref 15–500)
HCT: 38.4 % (ref 35.0–45.0)
HEMOGLOBIN: 12.6 g/dL (ref 11.7–15.5)
LYMPHS ABS: 2326 {cells}/uL (ref 850–3900)
MCH: 27.1 pg (ref 27.0–33.0)
MCHC: 32.8 g/dL (ref 32.0–36.0)
MCV: 82.6 fL (ref 80.0–100.0)
MONOS PCT: 7.7 %
MPV: 10.5 fL (ref 7.5–12.5)
NEUTROS ABS: 2929 {cells}/uL (ref 1500–7800)
Neutrophils Relative %: 50.5 %
Platelets: 269 10*3/uL (ref 140–400)
RBC: 4.65 10*6/uL (ref 3.80–5.10)
RDW: 14.1 % (ref 11.0–15.0)
Total Lymphocyte: 40.1 %
WBC mixed population: 447 cells/uL (ref 200–950)
WBC: 5.8 10*3/uL (ref 3.8–10.8)

## 2018-03-20 NOTE — Progress Notes (Signed)
   Subjective:    Patient ID: [name], female    DOB: 08-Jul-1951, 67 y.o.   MRN: [id number]  HPI   This 67 yo married lady presents with concerns of recent "spots" noted of her dorsal Rt forearm. She denies k/o trauma/injury.   Medication Sig  . PROAIR RESPICLICK   Inhale 2 puffs into the lungs every 4 (four) hours as needed.  Marland Kitchen amLODipine 2.5 MG Take 1 tablet (2.5 mg total) by mouth 2 (two) times daily.  Marland Kitchen aspirin 81 MG tablet Take 81 mg by mouth daily.  . bisoprolol-hctz 10-6.25 Take 0.5 tablets by mouth daily.  Marland Kitchen buPROPion-XL 150 MG  Take 1 tablet (150 mg total) by mouth every morning.  Marland Kitchen VITAMIN D3 5000 UNITS  Take  daily.  Marland Kitchen REPATHA  140 MG/ML Inject 140 mg into the skin every 14 (fourteen) days.  Marland Kitchen FLONASE  nasal spray 1 Spray each nostril daily  . losartan  100 MG tablet TAKE 1 TABLET  DAILY FOR BLOOD PRESSURE  . meloxicam  15 MG tablet TAKE 1 TABLET  DAILY  . methylcellulose oral powder Take  3  times daily as needed.  . montelukast 10 MG tablet TAKE 1 TABLET DAILY  . Multiple Vitamins-Minerals Take 1 capsule by mouth daily.  Marland Kitchen NIFEdipine-XL 60 MG  TAKE 1 TABLET  EVERY NIGHT  . Omega-3 FISH OIL 1000 MG  Take 1 capsule by mouth daily.  Marland Kitchen omeprazole  20 MG  TAKE 1 CAPSULE(20 MG) BY MOUTH EVERY DAY  . ranitidine  300 MG TAKE AS NEEDED  . SPIRIVA RESPIMAT AERS Inhale 2 puffs into the lungs daily.  . clonazePAM 0.5 MG TAKE 1/2-1 TABLET BY MOUTH AT BEDTIME FOR SLEEP   Allergies  Allergen Reactions  . Enalapril Swelling    Caused lips to swell and bloating.  Marland Kitchen No Known Allergies    Past Medical History:  Diagnosis Date  . Allergy    enviromental  . Anxiety   . Arthritis    osteoarthritis  . COPD (chronic obstructive pulmonary disease) (HCC)    mild  . Diabetes mellitus without complication ([place])    controlled by diet  . Environmental allergies   . Hepatitis    hepatitis A  . Hyperlipidemia   . Hypertension   . Mixed hyperlipidemia 06/15/2013  . PONV  (postoperative nausea and vomiting)    second surgery no problems last surgery  . Urgency of urination    Takes oxybutynin; under control at this time      Review of Systems   10 point systems review negative except as above.    Objective:   Physical Exam  BP (!) 182/84   Pulse 72   Temp (!) 97.3 F (36.3 C)   Resp 16   Ht 5\' 6"  (1.676 m)   Wt 159 lb 6.4 oz (72.3 kg)   BMI 25.73 kg/m   HEENT - WNL. Neck - supple.  Chest - Clear equal BS. Cor - Nl HS. RRR w/o sig MGR. PP 1(+). No edema. MS- FROM w/o deformities.  Gait Nl. Neuro -  Nl w/o focal abnormalities. Skin - Few small 1-2 cm ecchymoses on dorsal R forearm.     Assessment & Plan:   1. Senile purpura (HCC)  - CBC with Differential/Platelet  2. Medication management  - CBC with Differential/Platelet  - advised to taper her LD bASA to 3 x/week

## 2018-03-21 ENCOUNTER — Encounter: Payer: Self-pay | Admitting: Internal Medicine

## 2018-03-31 DIAGNOSIS — G4733 Obstructive sleep apnea (adult) (pediatric): Secondary | ICD-10-CM | POA: Diagnosis not present

## 2018-04-13 DIAGNOSIS — G4733 Obstructive sleep apnea (adult) (pediatric): Secondary | ICD-10-CM | POA: Diagnosis not present

## 2018-04-16 NOTE — Progress Notes (Signed)
FOLLOW UP  Assessment and Plan:   Atherosclerosis of aorta Control blood pressure, cholesterol, glucose, increase exercise.   Hypertension Well controlled with current medications  Monitor blood pressure at home; patient to call if consistently greater than 130/80 Continue DASH diet.   Reminder to go to the ER if any CP, SOB, nausea, dizziness, severe HA, changes vision/speech, left arm numbness and tingling and jaw pain.  Cholesterol Currently above goal; newly on repatha, took shot #2 Continue low cholesterol diet and exercise.  Check lipid panel.   Other abnormal glucose  Recent A1Cs at goal Discussed diet/exercise, weight management  Defer A1C; check CMP  Overweight Long discussion about weight loss, diet, and exercise Recommended diet heavy in fruits and veggies and low in animal meats, cheeses, and dairy products, appropriate calorie intake Discussed ideal weight for height and initial weight goal (140lb) Patient will work on increasing exercise, watching portions Will follow up in 3 months  Vitamin D Def At goal at last visit; continue supplementation to maintain goal of 70-100 Defer Vit D level  GERD Well managed on current medications, didn't tolerate transition to H2 inhibitor Discussed diet, avoiding triggers and other lifestyle changes  Tobacco use Discussed risks associated with tobacco use and advised to reduce or quit Patient is not ready to do so, but advised to consider strongly Will follow up at the next visit    Continue diet and meds as discussed. Further disposition pending results of labs. Discussed med's effects and SE's.   Over 30 minutes of exam, counseling, chart review, and critical decision making was performed.   Future Appointments  Date Time Provider [place]  04/23/2018 12:00 PM Lauraine Rinne, NP [facility] None  05/13/2018  8:40 AM Lelon Perla, MD [facility]  06/16/2018  3:00 PM Lauraine Rinne, NP  [facility] None  07/24/2018  2:30 PM Unk Pinto, MD [facility] None  02/04/2019  9:00 AM Unk Pinto, MD [facility] None    ----------------------------------------------------------------------------------------------------------------------  HPI 67 y.o. female  presents for 3 month follow up on hypertension, cholesterol, glucose management, weight and vitamin D deficiency. She is newly on CPAP and working up to get to 4 hours per Dr. Lamonte Sakai pulmonology. She reports chronic lower back pain is improving with increased exercise at the gym.   she currently continues to smoke 0.5 pack a day; discussed risks associated with smoking, patient is not ready to quit.   she has a diagnosis of GERD which is currently managed by omeprazole (failed trial on ranitidine).  she reports symptoms is currently well controlled, and denies breakthrough reflux, burning in chest, hoarseness or cough.    BMI is Body mass index is 26.24 kg/m., she has been working on diet and exercise, has been going to the GYM 3 days a week.  Wt Readings from Last 3 Encounters:  04/17/18 162 lb 9.6 oz (73.8 kg)  03/20/18 159 lb 6.4 oz (72.3 kg)  02/26/18 158 lb 8 oz (71.9 kg)   Her blood pressure has been controlled at home, today their BP is BP: 128/62  She does workout. She denies chest pain, shortness of breath, dizziness.   She is on cholesterol medication (repatha by cardiology due to ? Statin intolerance). Her cholesterol is not at goal. The cholesterol last visit was:   Lab Results  Component Value Date   CHOL 240 (H) 01/08/2018   HDL 93 01/08/2018   LDLCALC 132 (H) 01/08/2018   TRIG 54 01/08/2018   CHOLHDL 2.6 01/08/2018  She has been working on diet and exercise for glucose management, and denies foot ulcerations, increased appetite, nausea, paresthesia of the feet, polydipsia, polyuria and visual disturbances. Last A1C in the office was:  Lab Results  Component Value Date   HGBA1C 5.3 01/08/2018    Patient is on Vitamin D supplement.   Lab Results  Component Value Date   VD25OH 58 01/08/2018        Current Medications:  Current Outpatient Medications on File Prior to Visit  Medication Sig  . amLODipine (NORVASC) 2.5 MG tablet Take 1 tablet (2.5 mg total) by mouth 2 (two) times daily.  Marland Kitchen aspirin 81 MG tablet Take 81 mg by mouth daily.  . bisoprolol-hydrochlorothiazide (ZIAC) 10-6.25 MG tablet Take 0.5 tablets by mouth daily.  Marland Kitchen buPROPion (WELLBUTRIN XL) 150 MG 24 hr tablet Take 1 tablet (150 mg total) by mouth every morning.  . Cholecalciferol (VITAMIN D3) 5000 UNITS TABS Take 5,000 Units by mouth daily.  Marland Kitchen dextromethorphan-guaiFENesin (MUCINEX DM) 30-600 MG 12hr tablet Take 1 tablet by mouth 2 (two) times daily.  . Evolocumab (REPATHA SURECLICK) 706 MG/ML SOAJ Inject 140 mg into the skin every 14 (fourteen) days.  . fluticasone (FLONASE) 50 MCG/ACT nasal spray 1 Spray each nostril daily  . losartan (COZAAR) 100 MG tablet TAKE 1 TABLET BY MOUTH DAILY FOR BLOOD PRESSURE  . meloxicam (MOBIC) 15 MG tablet TAKE 1 TABLET BY MOUTH DAILY  . methylcellulose oral powder Take by mouth 3 (three) times daily as needed.  . montelukast (SINGULAIR) 10 MG tablet TAKE 1 TABLET(10 MG) BY MOUTH DAILY  . Multiple Vitamins-Minerals (MULTI COMPLETE PO) Take 1 capsule by mouth daily.  Marland Kitchen NIFEdipine (PROCARDIA XL/ADALAT-CC) 60 MG 24 hr tablet TAKE 1 TABLET BY MOUTH EVERY NIGHT FOR BLOOD PRESSURE  . Omega-3 Fatty Acids (FISH OIL) 1000 MG CAPS Take 1 capsule by mouth daily.  Marland Kitchen omeprazole (PRILOSEC) 20 MG capsule TAKE 1 CAPSULE(20 MG) BY MOUTH EVERY DAY  . Tiotropium Bromide Monohydrate (SPIRIVA RESPIMAT) 1.25 MCG/ACT AERS Inhale 2 Inhalers into the lungs daily.  . clonazePAM (KLONOPIN) 0.5 MG tablet TAKE 1/2-1 TABLET BY MOUTH AT BEDTIME FOR SLEEP   No current facility-administered medications on file prior to visit.      Allergies:  Allergies  Allergen Reactions  . Enalapril Swelling    Caused  lips to swell and bloating.  Marland Kitchen No Known Allergies      Medical History:  Past Medical History:  Diagnosis Date  . Allergy    enviromental  . Anxiety   . Arthritis    osteoarthritis  . COPD (chronic obstructive pulmonary disease) (HCC)    mild  . Diabetes mellitus without complication ([place])    controlled by diet  . Environmental allergies   . Hepatitis    hepatitis A  . Hyperlipidemia   . Hypertension   . Mixed hyperlipidemia 06/15/2013  . PONV (postoperative nausea and vomiting)    second surgery no problems last surgery  . Urgency of urination    Takes oxybutynin; under control at this time   Family history- Reviewed and unchanged Social history- Reviewed and unchanged   Review of Systems:  Review of Systems  Constitutional: Negative for malaise/fatigue and weight loss.  HENT: Negative for hearing loss and tinnitus.   Eyes: Negative for blurred vision and double vision.  Respiratory: Negative for cough, shortness of breath and wheezing.   Cardiovascular: Negative for chest pain, palpitations, orthopnea, claudication and leg swelling.  Gastrointestinal: Negative for abdominal pain,  blood in stool, constipation, diarrhea, heartburn, melena, nausea and vomiting.  Genitourinary: Negative.   Musculoskeletal: Positive for back pain (lower back pain improving with exercise). Negative for joint pain and myalgias.  Skin: Negative for rash.  Neurological: Negative for dizziness, tingling, sensory change, weakness and headaches.  Endo/Heme/Allergies: Negative for polydipsia.  Psychiatric/Behavioral: Negative.   All other systems reviewed and are negative.   Physical Exam: BP 128/62   Pulse 68   Temp (!) 97.1 F (36.2 C)   Resp 16   Ht 5\' 6"  (1.676 m)   Wt 162 lb 9.6 oz (73.8 kg)   BMI 26.24 kg/m  Wt Readings from Last 3 Encounters:  04/17/18 162 lb 9.6 oz (73.8 kg)  03/20/18 159 lb 6.4 oz (72.3 kg)  02/26/18 158 lb 8 oz (71.9 kg)   General Appearance: Well  nourished, in no apparent distress. Eyes: PERRLA, EOMs, conjunctiva no swelling or erythema Sinuses: No Frontal/maxillary tenderness ENT/Mouth: Ext aud canals clear, TMs without erythema, bulging. No erythema, swelling, or exudate on post pharynx.  Tonsils not swollen or erythematous. Hearing normal.  Neck: Supple, thyroid normal.  Respiratory: Respiratory effort normal, BS equal bilaterally without rales, rhonchi, wheezing or stridor.  Cardio: RRR with no MRGs. Brisk peripheral pulses without edema.  Abdomen: Soft, + BS.  Non tender, no guarding, rebound, hernias, masses. Lymphatics: Non tender without lymphadenopathy.  Musculoskeletal: Full ROM, 5/5 strength, Normal gait Skin: Warm, dry without rashes, lesions, ecchymosis.  Neuro: Cranial nerves intact. No cerebellar symptoms.  Psych: Awake and oriented X 3, normal affect, Insight and Judgment appropriate.    Sherri Ribas, NP 2:57 PM [facility]

## 2018-04-17 ENCOUNTER — Ambulatory Visit (INDEPENDENT_AMBULATORY_CARE_PROVIDER_SITE_OTHER): Payer: PPO | Admitting: Adult Health

## 2018-04-17 ENCOUNTER — Encounter: Payer: Self-pay | Admitting: Adult Health

## 2018-04-17 VITALS — BP 128/62 | HR 68 | Temp 97.1°F | Resp 16 | Ht 66.0 in | Wt 162.6 lb

## 2018-04-17 DIAGNOSIS — Z79899 Other long term (current) drug therapy: Secondary | ICD-10-CM

## 2018-04-17 DIAGNOSIS — E782 Mixed hyperlipidemia: Secondary | ICD-10-CM

## 2018-04-17 DIAGNOSIS — E663 Overweight: Secondary | ICD-10-CM | POA: Diagnosis not present

## 2018-04-17 DIAGNOSIS — K219 Gastro-esophageal reflux disease without esophagitis: Secondary | ICD-10-CM | POA: Diagnosis not present

## 2018-04-17 DIAGNOSIS — F17200 Nicotine dependence, unspecified, uncomplicated: Secondary | ICD-10-CM

## 2018-04-17 DIAGNOSIS — I700 Atherosclerosis of aorta: Secondary | ICD-10-CM | POA: Diagnosis not present

## 2018-04-17 DIAGNOSIS — I10 Essential (primary) hypertension: Secondary | ICD-10-CM

## 2018-04-17 DIAGNOSIS — E559 Vitamin D deficiency, unspecified: Secondary | ICD-10-CM | POA: Diagnosis not present

## 2018-04-17 DIAGNOSIS — R7309 Other abnormal glucose: Secondary | ICD-10-CM

## 2018-04-17 NOTE — Patient Instructions (Addendum)
Goals    . LDL CALC < 70    . Weight (lb) < 140 lb (63.5 kg)        Know what a healthy weight is for you (roughly BMI <25) and aim to maintain this  Aim for 7+ servings of fruits and vegetables daily  65-80+ fluid ounces of water or unsweet tea for healthy kidneys  Limit to max 1 drink of alcohol per day; avoid smoking/tobacco  Limit animal fats in diet for cholesterol and heart health - choose grass fed whenever available  Avoid highly processed foods, and foods high in saturated/trans fats  Aim for low stress - take time to unwind and care for your mental health  Aim for 150 min of moderate intensity exercise weekly for heart health, and weights twice weekly for bone health  Aim for 7-9 hours of sleep daily     Steps to Quit Smoking Smoking tobacco can be harmful to your health and can affect almost every organ in your body. Smoking puts you, and those around you, at risk for developing many serious chronic diseases. Quitting smoking is difficult, but it is one of the best things that you can do for your health. It is never too late to quit. What are the benefits of quitting smoking? When you quit smoking, you lower your risk of developing serious diseases and conditions, such as:  Lung cancer or lung disease, such as COPD.  Heart disease.  Stroke.  Heart attack.  Infertility.  Osteoporosis and bone fractures.  Additionally, symptoms such as coughing, wheezing, and shortness of breath may get better when you quit. You may also find that you get sick less often because your body is stronger at fighting off colds and infections. If you are pregnant, quitting smoking can help to reduce your chances of having a baby of low birth weight. How do I get ready to quit? When you decide to quit smoking, create a plan to make sure that you are successful. Before you quit:  Pick a date to quit. Set a date within the next two weeks to give you time to prepare.  Write down the  reasons why you are quitting. Keep this list in places where you will see it often, such as on your bathroom mirror or in your car or wallet.  Identify the people, places, things, and activities that make you want to smoke (triggers) and avoid them. Make sure to take these actions: ? Throw away all cigarettes at home, at work, and in your car. ? Throw away smoking accessories, such as Scientist, research (medical). ? Clean your car and make sure to empty the ashtray. ? Clean your home, including curtains and carpets.  Tell your family, friends, and coworkers that you are quitting. Support from your loved ones can make quitting easier.  Talk with your health care provider about your options for quitting smoking.  Find out what treatment options are covered by your health insurance.  What strategies can I use to quit smoking? Talk with your healthcare provider about different strategies to quit smoking. Some strategies include:  Quitting smoking altogether instead of gradually lessening how much you smoke over a period of time. Research shows that quitting "cold [place]" is more successful than gradually quitting.  Attending in-person counseling to help you build problem-solving skills. You are more likely to have success in quitting if you attend several counseling sessions. Even short sessions of 10 minutes can be effective.  Finding resources and  support systems that can help you to quit smoking and remain smoke-free after you quit. These resources are most helpful when you use them often. They can include: ? Online chats with a Social worker. ? Telephone quitlines. ? Careers information officer. ? Support groups or group counseling. ? Text messaging programs. ? Mobile phone applications.  Taking medicines to help you quit smoking. (If you are pregnant or breastfeeding, talk with your health care provider first.) Some medicines contain nicotine and some do not. Both types of medicines help with  cravings, but the medicines that include nicotine help to relieve withdrawal symptoms. Your health care provider may recommend: ? Nicotine patches, gum, or lozenges. ? Nicotine inhalers or sprays. ? Non-nicotine medicine that is taken by mouth.  Talk with your health care provider about combining strategies, such as taking medicines while you are also receiving in-person counseling. Using these two strategies together makes you more likely to succeed in quitting than if you used either strategy on its own. If you are pregnant or breastfeeding, talk with your health care provider about finding counseling or other support strategies to quit smoking. Do not take medicine to help you quit smoking unless told to do so by your health care provider. What things can I do to make it easier to quit? Quitting smoking might feel overwhelming at first, but there is a lot that you can do to make it easier. Take these important actions:  Reach out to your family and friends and ask that they support and encourage you during this time. Call telephone quitlines, reach out to support groups, or work with a counselor for support.  Ask people who smoke to avoid smoking around you.  Avoid places that trigger you to smoke, such as bars, parties, or smoke-break areas at work.  Spend time around people who do not smoke.  Lessen stress in your life, because stress can be a smoking trigger for some people. To lessen stress, try: ? Exercising regularly. ? Deep-breathing exercises. ? Yoga. ? Meditating. ? Performing a body scan. This involves closing your eyes, scanning your body from head to toe, and noticing which parts of your body are particularly tense. Purposefully relax the muscles in those areas.  Download or purchase mobile phone or tablet apps (applications) that can help you stick to your quit plan by providing reminders, tips, and encouragement. There are many free apps, such as QuitGuide from the [employer]  Office manager for Disease Control and Prevention). You can find other support for quitting smoking (smoking cessation) through smokefree.gov and other websites.  How will I feel when I quit smoking? Within the first 24 hours of quitting smoking, you may start to feel some withdrawal symptoms. These symptoms are usually most noticeable 2-3 days after quitting, but they usually do not last beyond 2-3 weeks. Changes or symptoms that you might experience include:  Mood swings.  Restlessness, anxiety, or irritation.  Difficulty concentrating.  Dizziness.  Strong cravings for sugary foods in addition to nicotine.  Mild weight gain.  Constipation.  Nausea.  Coughing or a sore throat.  Changes in how your medicines work in your body.  A depressed mood.  Difficulty sleeping (insomnia).  After the first 2-3 weeks of quitting, you may start to notice more positive results, such as:  Improved sense of smell and taste.  Decreased coughing and sore throat.  Slower heart rate.  Lower blood pressure.  Clearer skin.  The ability to breathe more easily.  Fewer sick days.  Quitting smoking is very challenging for most people. Do not get discouraged if you are not successful the first time. Some people need to make many attempts to quit before they achieve long-term success. Do your best to stick to your quit plan, and talk with your health care provider if you have any questions or concerns. This information is not intended to replace advice given to you by your health care provider. Make sure you discuss any questions you have with your health care provider. Document Released: 06/26/2001 Document Revised: 02/28/2016 Document Reviewed: 11/16/2014 Elsevier Interactive Patient Education  [employer].

## 2018-04-18 LAB — LIPID PANEL
Cholesterol: 186 mg/dL (ref <200)
HDL: 86 mg/dL (ref >50)
LDL Cholesterol (Calc): 75 mg/dL (calc)
NON-HDL CHOLESTEROL (CALC): 100 mg/dL (ref <130)
TRIGLYCERIDES: 152 mg/dL — AB (ref <150)
Total CHOL/HDL Ratio: 2.2 (calc) (ref <5.0)

## 2018-04-18 LAB — COMPLETE METABOLIC PANEL WITH GFR
AG RATIO: 2.1 (calc) (ref 1.0–2.5)
ALBUMIN MSPROF: 4.4 g/dL (ref 3.6–5.1)
ALT: 10 U/L (ref 6–29)
AST: 17 U/L (ref 10–35)
Alkaline phosphatase (APISO): 67 U/L (ref 33–130)
BILIRUBIN TOTAL: 0.4 mg/dL (ref 0.2–1.2)
BUN / CREAT RATIO: 13 (calc) (ref 6–22)
BUN: 14 mg/dL (ref 7–25)
CHLORIDE: 103 mmol/L (ref 98–110)
CO2: 29 mmol/L (ref 20–32)
Calcium: 9.5 mg/dL (ref 8.6–10.4)
Creat: 1.09 mg/dL — ABNORMAL HIGH (ref 0.50–0.99)
GFR, EST AFRICAN AMERICAN: 61 mL/min/{1.73_m2} (ref >=60)
GFR, Est Non African American: 52 mL/min/{1.73_m2} — ABNORMAL LOW (ref >=60)
Globulin: 2.1 g/dL (calc) (ref 1.9–3.7)
Glucose, Bld: 97 mg/dL (ref 65–99)
POTASSIUM: 4.6 mmol/L (ref 3.5–5.3)
Sodium: 140 mmol/L (ref 135–146)
TOTAL PROTEIN: 6.5 g/dL (ref 6.1–8.1)

## 2018-04-18 LAB — CBC WITH DIFFERENTIAL/PLATELET
BASOS ABS: 33 {cells}/uL (ref 0–200)
Basophils Relative: 0.5 %
EOS ABS: 163 {cells}/uL (ref 15–500)
Eosinophils Relative: 2.5 %
HEMATOCRIT: 39.7 % (ref 35.0–45.0)
Hemoglobin: 13.2 g/dL (ref 11.7–15.5)
Lymphs Abs: 2613 cells/uL (ref 850–3900)
MCH: 27.3 pg (ref 27.0–33.0)
MCHC: 33.2 g/dL (ref 32.0–36.0)
MCV: 82.2 fL (ref 80.0–100.0)
MPV: 10.5 fL (ref 7.5–12.5)
Monocytes Relative: 7.3 %
NEUTROS PCT: 49.5 %
Neutro Abs: 3218 cells/uL (ref 1500–7800)
Platelets: 260 10*3/uL (ref 140–400)
RBC: 4.83 10*6/uL (ref 3.80–5.10)
RDW: 14.1 % (ref 11.0–15.0)
Total Lymphocyte: 40.2 %
WBC mixed population: 475 cells/uL (ref 200–950)
WBC: 6.5 10*3/uL (ref 3.8–10.8)

## 2018-04-18 LAB — MAGNESIUM: Magnesium: 2 mg/dL (ref 1.5–2.5)

## 2018-04-18 LAB — TSH: TSH: 0.8 m[IU]/L (ref 0.40–4.50)

## 2018-04-22 NOTE — Progress Notes (Deleted)
@Patient  ID: [name], female    DOB: 04-16-1951, 67 y.o.   MRN: [id number]  No chief complaint on file.   Referring provider: Unk Pinto, MD  HPI:  67 year old smoker (30 pack years). Followed in our office for mild obstruction on 11/2017 PFTs.  PMH: HTN, DMT2, Hyperlipidemia, AR Maintenance: Spiriva 1.25 Smoking Status/History:  Current smoker (30 pack years), smoking 7-10 cigarettes a day, willing to quit Pt of: Dr. Lamonte Sakai  04/22/2018  - Visit   HPI  Tests:  12/11/2017-pulmonary function test- post bronc ratio 77, FEV1 95, no significant bronchodilator response DLCO 71 02/23/2018- Home sleep study results- AHI 12.5 an hour, SaO2 low of 79% with an average of 87%.   Imaging:  11/18/2017-chest x-ray- COPD changes with minimal left basilar atelectasis, mild hyperinflation  Cardiac:  04/03/2017-echocardiogram-LV ejection fraction 55 to 60%  FENO:  No results found for: NITRICOXIDE  PFT: PFT Results Latest Ref Rng & Units 12/11/2017  FVC-Pre L 3.30  FVC-Predicted Pre % 102  FVC-Post L 3.12  FVC-Predicted Post % 97  Pre FEV1/FVC % % 71  Post FEV1/FCV % % 79  FEV1-Pre L 2.34  FEV1-Predicted Pre % 95  DLCO UNC% % 71  DLCO COR %Predicted % 74    Imaging: No results found.  Chart Review:    Specialty Problems      Pulmonary Problems   COPD GOLD 0     12/11/2017-pulmonary function test- post bronch ratio 77, FEV1 95, no significant bronchodilator response DLCO 71      Chronic cough   OSA (obstructive sleep apnea)    02/23/2018- Home sleep study-AHI 12.5 an hour lowest SaO2 was 79% with an average of 87%         Allergies  Allergen Reactions  . Enalapril Swelling    Caused lips to swell and bloating.  Marland Kitchen No Known Allergies     Immunization History  Administered Date(s) Administered  . Influenza, High Dose Seasonal PF 04/10/2017  . Influenza-Unspecified 03/05/2018  . PPD Test 09/21/2014  . Pneumococcal Conjugate-13 10/22/2017  . Pneumococcal  Polysaccharide-23 02/07/2011, 01/26/2016  . Tdap 02/07/2011    Past Medical History:  Diagnosis Date  . Allergy    enviromental  . Anxiety   . Arthritis    osteoarthritis  . COPD (chronic obstructive pulmonary disease) (HCC)    mild  . Diabetes mellitus without complication ([place])    controlled by diet  . Environmental allergies   . Hepatitis    hepatitis A  . Hyperlipidemia   . Hypertension   . Mixed hyperlipidemia 06/15/2013  . PONV (postoperative nausea and vomiting)    second surgery no problems last surgery  . Urgency of urination    Takes oxybutynin; under control at this time    Tobacco History: Social History   Tobacco Use  Smoking Status Current Every Day Smoker  . Packs/day: 0.50  . Years: 30.00  . Pack years: 15.00  . Types: Cigarettes  . Start date: 1985  Smokeless Tobacco Never Used  Tobacco Comment   10 cigarettes/day 02/11/2018   Ready to quit: Not Answered Counseling given: Not Answered Comment: 10 cigarettes/day 02/11/2018   Outpatient Encounter Medications as of 04/23/2018  Medication Sig  . amLODipine (NORVASC) 2.5 MG tablet Take 1 tablet (2.5 mg total) by mouth 2 (two) times daily.  Marland Kitchen aspirin 81 MG tablet Take 81 mg by mouth daily.  . bisoprolol-hydrochlorothiazide (ZIAC) 10-6.25 MG tablet Take 0.5 tablets by mouth daily.  Marland Kitchen buPROPion (  WELLBUTRIN XL) 150 MG 24 hr tablet Take 1 tablet (150 mg total) by mouth every morning.  . Cholecalciferol (VITAMIN D3) 5000 UNITS TABS Take 5,000 Units by mouth daily.  . clonazePAM (KLONOPIN) 0.5 MG tablet TAKE 1/2-1 TABLET BY MOUTH AT BEDTIME FOR SLEEP  . dextromethorphan-guaiFENesin (MUCINEX DM) 30-600 MG 12hr tablet Take 1 tablet by mouth 2 (two) times daily.  . Evolocumab (REPATHA SURECLICK) 102 MG/ML SOAJ Inject 140 mg into the skin every 14 (fourteen) days.  . fluticasone (FLONASE) 50 MCG/ACT nasal spray 1 Spray each nostril daily  . losartan (COZAAR) 100 MG tablet TAKE 1 TABLET BY MOUTH DAILY FOR BLOOD  PRESSURE  . meloxicam (MOBIC) 15 MG tablet TAKE 1 TABLET BY MOUTH DAILY  . methylcellulose oral powder Take by mouth 3 (three) times daily as needed.  . montelukast (SINGULAIR) 10 MG tablet TAKE 1 TABLET(10 MG) BY MOUTH DAILY  . Multiple Vitamins-Minerals (MULTI COMPLETE PO) Take 1 capsule by mouth daily.  Marland Kitchen NIFEdipine (PROCARDIA XL/ADALAT-CC) 60 MG 24 hr tablet TAKE 1 TABLET BY MOUTH EVERY NIGHT FOR BLOOD PRESSURE  . Omega-3 Fatty Acids (FISH OIL) 1000 MG CAPS Take 1 capsule by mouth daily.  Marland Kitchen omeprazole (PRILOSEC) 20 MG capsule TAKE 1 CAPSULE(20 MG) BY MOUTH EVERY DAY  . Tiotropium Bromide Monohydrate (SPIRIVA RESPIMAT) 1.25 MCG/ACT AERS Inhale 2 Inhalers into the lungs daily.   No facility-administered encounter medications on file as of 04/23/2018.      Review of Systems  Review of Systems   Physical Exam  There were no vitals taken for this visit.  Wt Readings from Last 5 Encounters:  04/17/18 162 lb 9.6 oz (73.8 kg)  03/20/18 159 lb 6.4 oz (72.3 kg)  02/26/18 158 lb 8 oz (71.9 kg)  02/11/18 164 lb (74.4 kg)  01/08/18 163 lb 12.8 oz (74.3 kg)     Physical Exam    Lab Results:  CBC    Component Value Date/Time   WBC 6.5 04/17/2018 1516   RBC 4.83 04/17/2018 1516   HGB 13.2 04/17/2018 1516   HCT 39.7 04/17/2018 1516   PLT 260 04/17/2018 1516   MCV 82.2 04/17/2018 1516   MCH 27.3 04/17/2018 1516   MCHC 33.2 04/17/2018 1516   RDW 14.1 04/17/2018 1516   LYMPHSABS 2,613 04/17/2018 1516   MONOABS 496 12/18/2016 1225   EOSABS 163 04/17/2018 1516   BASOSABS 33 04/17/2018 1516    BMET    Component Value Date/Time   NA 140 04/17/2018 1516   K 4.6 04/17/2018 1516   CL 103 04/17/2018 1516   CO2 29 04/17/2018 1516   GLUCOSE 97 04/17/2018 1516   BUN 14 04/17/2018 1516   CREATININE 1.09 (H) 04/17/2018 1516   CALCIUM 9.5 04/17/2018 1516   GFRNONAA 52 (L) 04/17/2018 1516   GFRAA 61 04/17/2018 1516    BNP No results found for: BNP  ProBNP No results found  for: PROBNP    Assessment & Plan:     No problem-specific Assessment & Plan notes found for this encounter.     Lauraine Rinne, NP 04/22/2018

## 2018-04-23 ENCOUNTER — Ambulatory Visit: Payer: PPO | Admitting: Pulmonary Disease

## 2018-04-29 ENCOUNTER — Telehealth: Payer: Self-pay | Admitting: Pharmacist

## 2018-04-29 MED ORDER — ALIROCUMAB 150 MG/ML ~~LOC~~ SOPN: 150.0000 mg | PEN_INJECTOR | SUBCUTANEOUS | 11 refills | Status: DC

## 2018-04-29 NOTE — Telephone Encounter (Signed)
3rd injection developed lip swelling, sore throat, and HA.  STOP Repatha, not medication for 1 month. Trial with Praleunt Nov/9th  Medication Samples have been provided to the patient.  Drug name: Praluent       Strength: 150mg  Qty: 2   LOT: [id number]  Exp.Date: 01/13/19   The patient has been instructed regarding the correct time, dose, and frequency of taking this medication, including desired effects and most common side effects.   Aimi Essner Rodriguez-Guzman PharmD, BCPS, [place] [address] [place] [address] 04/29/2018 4:57 PM

## 2018-05-05 NOTE — Progress Notes (Signed)
HPI: FU CP. Carotid Dopplers July 2013 showed less than 50% bilateral stenosis. Echocardiogram September 2018 showed normal LV function, trace aortic insufficiency and mild left atrial enlargement. Stress echocardiogram October 2018 showed no stress-induced wall motion abnormalities. Since last seen, she denies dyspnea, chest pain, palpitations or syncope.  Current Outpatient Medications  Medication Sig Dispense Refill  . amLODipine (NORVASC) 2.5 MG tablet Take 1 tablet (2.5 mg total) by mouth 2 (two) times daily. 60 tablet 5  . aspirin 81 MG tablet Take 81 mg by mouth every other day.     . bisoprolol-hydrochlorothiazide (ZIAC) 10-6.25 MG tablet Take 0.5 tablets by mouth daily. 15 tablet 5  . buPROPion (WELLBUTRIN XL) 150 MG 24 hr tablet Take 1 tablet (150 mg total) by mouth every morning. 90 tablet 1  . Cholecalciferol (VITAMIN D3) 5000 UNITS TABS Take 5,000 Units by mouth daily.    Marland Kitchen dextromethorphan-guaiFENesin (MUCINEX DM) 30-600 MG 12hr tablet Take 1 tablet by mouth 2 (two) times daily.    . fluticasone (FLONASE) 50 MCG/ACT nasal spray 1 Spray each nostril daily 48 g 1  . losartan (COZAAR) 100 MG tablet TAKE 1 TABLET BY MOUTH DAILY FOR BLOOD PRESSURE 90 tablet 0  . meloxicam (MOBIC) 15 MG tablet TAKE 1 TABLET BY MOUTH DAILY 90 tablet 0  . methylcellulose oral powder Take by mouth 3 (three) times daily as needed.    . montelukast (SINGULAIR) 10 MG tablet TAKE 1 TABLET(10 MG) BY MOUTH DAILY 90 tablet 3  . Multiple Vitamins-Minerals (MULTI COMPLETE PO) Take 1 capsule by mouth daily.    Marland Kitchen NIFEdipine (PROCARDIA XL/ADALAT-CC) 60 MG 24 hr tablet TAKE 1 TABLET BY MOUTH EVERY NIGHT FOR BLOOD PRESSURE 90 tablet 1  . Omega-3 Fatty Acids (FISH OIL) 1000 MG CAPS Take 1 capsule by mouth daily.    Marland Kitchen omeprazole (PRILOSEC) 20 MG capsule TAKE 1 CAPSULE(20 MG) BY MOUTH EVERY DAY 90 capsule 1  . Tiotropium Bromide Monohydrate (SPIRIVA RESPIMAT) 1.25 MCG/ACT AERS Inhale 2 Inhalers into the lungs daily.     . clonazePAM (KLONOPIN) 0.5 MG tablet TAKE 1/2-1 TABLET BY MOUTH AT BEDTIME FOR SLEEP 90 tablet 1   No current facility-administered medications for this visit.      Past Medical History:  Diagnosis Date  . Allergy    enviromental  . Anxiety   . Arthritis    osteoarthritis  . COPD (chronic obstructive pulmonary disease) (HCC)    mild  . Diabetes mellitus without complication ([place])    controlled by diet  . Environmental allergies   . Hepatitis    hepatitis A  . Hyperlipidemia   . Hypertension   . Mixed hyperlipidemia 06/15/2013  . PONV (postoperative nausea and vomiting)    second surgery no problems last surgery  . Urgency of urination    Takes oxybutynin; under control at this time    Past Surgical History:  Procedure Laterality Date  . APPENDECTOMY     removed as child  . BACK SURGERY     lumbar fusion  . COLONOSCOPY    . DILATION AND CURETTAGE OF UTERUS    . LUMBAR FUSION  02/06/2012   L 4  L5  . PARTIAL HYSTERECTOMY    . TOTAL HIP ARTHROPLASTY  06/02/2012   Procedure: TOTAL HIP ARTHROPLASTY;  Surgeon: Kerin Salen, MD;  Location: [place];  Service: Orthopedics;  Laterality: Right;  . TOTAL HIP ARTHROPLASTY Left 01/25/2016  . TOTAL HIP ARTHROPLASTY Left 01/25/2016  Procedure: LEFT TOTAL HIP ARTHROPLASTY;  Surgeon: Frederik Pear, MD;  Location: [place];  Service: Orthopedics;  Laterality: Left;  . TOTAL KNEE ARTHROPLASTY Right 12/15/2012   Procedure: TOTAL KNEE ARTHROPLASTY;  Surgeon: Kerin Salen, MD;  Location: [place];  Service: Orthopedics;  Laterality: Right;  . TOTAL KNEE ARTHROPLASTY Left 06/18/2016   Procedure: TOTAL KNEE ARTHROPLASTY;  Surgeon: Frederik Pear, MD;  Location: [place];  Service: Orthopedics;  Laterality: Left;    Social History   Socioeconomic History  . Marital status: Married    Spouse name: Not on file  . Number of children: 2  . Years of education: Not on file  . Highest education level: Not on file  Occupational History  . Not on file    Social Needs  . Financial resource strain: Not on file  . Food insecurity:    Worry: Not on file    Inability: Not on file  . Transportation needs:    Medical: Not on file    Non-medical: Not on file  Tobacco Use  . Smoking status: Current Every Day Smoker    Packs/day: 0.50    Years: 30.00    Pack years: 15.00    Types: Cigarettes    Start date: 70  . Smokeless tobacco: Never Used  . Tobacco comment: 10 cigarettes/day 02/11/2018  Substance and Sexual Activity  . Alcohol use: Yes    Alcohol/week: 14.0 standard drinks    Types: 14 Glasses of wine per week    Comment: either a glass of wine a night or 2 beers-advised none within 48 hrs of surgery  . Drug use: Yes    Types: Marijuana    Comment: last 06/12/16- advised none within 48 hrs of surgery  . Sexual activity: Yes    Birth control/protection: Surgical  Lifestyle  . Physical activity:    Days per week: Not on file    Minutes per session: Not on file  . Stress: Not on file  Relationships  . Social connections:    Talks on phone: Not on file    Gets together: Not on file    Attends religious service: Not on file    Active member of club or organization: Not on file    Attends meetings of clubs or organizations: Not on file    Relationship status: Not on file  . Intimate partner violence:    Fear of current or ex partner: Not on file    Emotionally abused: Not on file    Physically abused: Not on file    Forced sexual activity: Not on file  Other Topics Concern  . Not on file  Social History Narrative  . Not on file    Family History  Problem Relation Age of Onset  . Diabetes Mother   . Colon cancer Mother   . Hypertension Father   . CAD Father   . Colon cancer Sister   . Lung cancer Brother   . Hypertension Brother   . Hypertension Brother   . Esophageal cancer Neg Hx   . Rectal cancer Neg Hx   . Stomach cancer Neg Hx     ROS: no fevers or chills, productive cough, hemoptysis, dysphasia,  odynophagia, melena, hematochezia, dysuria, hematuria, rash, seizure activity, orthopnea, PND, pedal edema, claudication. Remaining systems are negative.  Physical Exam: Well-developed well-nourished in no acute distress.  Skin is warm and dry.  HEENT is normal.  Neck is supple.  Chest is clear to auscultation with normal  expansion.  Cardiovascular exam is regular rate and rhythm.  Abdominal exam nontender or distended. No masses palpated. Extremities show no edema. neuro grossly intact   A/P  1 chest pain-patient has had no recurrent symptoms.  Previous functional study negative.  No plans for further evaluation.  2 hypertension-patient's blood pressure is controlled.  Continue present medications and follow.  3 hyperlipidemia-she has been intolerant to statins.  She also did not tolerate Repatha.  She has been given samples of praulent.  4 tobacco abuse-patient counseled on discontinuing.  Kirk Ruths, MD

## 2018-05-08 ENCOUNTER — Ambulatory Visit (INDEPENDENT_AMBULATORY_CARE_PROVIDER_SITE_OTHER): Payer: PPO

## 2018-05-08 ENCOUNTER — Ambulatory Visit: Payer: Self-pay

## 2018-05-08 VITALS — BP 124/76 | HR 74 | Temp 97.2°F | Ht 66.0 in | Wt 160.0 lb

## 2018-05-08 DIAGNOSIS — Z79899 Other long term (current) drug therapy: Secondary | ICD-10-CM | POA: Diagnosis not present

## 2018-05-08 NOTE — Progress Notes (Signed)
Patient presents to the office for a nurse visit to have labs done to check kidney functions. BMP with GFR performed. Patient states that she has increased her water intake to help with her kidney function.

## 2018-05-09 LAB — BASIC METABOLIC PANEL WITH GFR
BUN: 12 mg/dL (ref 7–25)
CO2: 29 mmol/L (ref 20–32)
Calcium: 9.5 mg/dL (ref 8.6–10.4)
Chloride: 103 mmol/L (ref 98–110)
Creat: 0.88 mg/dL (ref 0.50–0.99)
GFR, EST AFRICAN AMERICAN: 79 mL/min/{1.73_m2} (ref >=60)
GFR, EST NON AFRICAN AMERICAN: 68 mL/min/{1.73_m2} (ref >=60)
Glucose, Bld: 95 mg/dL (ref 65–99)
POTASSIUM: 4.9 mmol/L (ref 3.5–5.3)
SODIUM: 138 mmol/L (ref 135–146)

## 2018-05-13 ENCOUNTER — Ambulatory Visit (INDEPENDENT_AMBULATORY_CARE_PROVIDER_SITE_OTHER): Payer: PPO | Admitting: Cardiology

## 2018-05-13 ENCOUNTER — Encounter: Payer: Self-pay | Admitting: Cardiology

## 2018-05-13 VITALS — BP 128/68 | HR 70 | Resp 16 | Ht 65.0 in | Wt 159.4 lb

## 2018-05-13 DIAGNOSIS — G4733 Obstructive sleep apnea (adult) (pediatric): Secondary | ICD-10-CM | POA: Diagnosis not present

## 2018-05-13 DIAGNOSIS — Z720 Tobacco use: Secondary | ICD-10-CM

## 2018-05-13 DIAGNOSIS — R072 Precordial pain: Secondary | ICD-10-CM | POA: Diagnosis not present

## 2018-05-13 DIAGNOSIS — I10 Essential (primary) hypertension: Secondary | ICD-10-CM | POA: Diagnosis not present

## 2018-05-13 DIAGNOSIS — E7800 Pure hypercholesterolemia, unspecified: Secondary | ICD-10-CM

## 2018-05-13 NOTE — Patient Instructions (Signed)

## 2018-06-11 ENCOUNTER — Telehealth: Payer: Self-pay

## 2018-06-11 ENCOUNTER — Other Ambulatory Visit: Payer: Self-pay | Admitting: Adult Health

## 2018-06-11 DIAGNOSIS — F5101 Primary insomnia: Secondary | ICD-10-CM

## 2018-06-11 MED ORDER — CLONAZEPAM 0.5 MG PO TABS: ORAL_TABLET | ORAL | 0 refills | Status: DC

## 2018-06-11 NOTE — Telephone Encounter (Signed)
Clonazepam refill request. Last written on 06/05/16. Last office visit on 05/08/18, Next office visit on 07/24/18.

## 2018-06-12 ENCOUNTER — Other Ambulatory Visit: Payer: Self-pay | Admitting: Adult Health

## 2018-06-13 DIAGNOSIS — G4733 Obstructive sleep apnea (adult) (pediatric): Secondary | ICD-10-CM | POA: Diagnosis not present

## 2018-06-15 NOTE — Progress Notes (Signed)
@Patient  ID: [name], female    DOB: 1950-09-22, 68 y.o.   MRN: [id number]  Chief Complaint  Patient presents with  . Follow-up    pt states she is doing well.  tolerating cpap well, no complaints currently.     Referring provider: Unk Pinto, MD  HPI:  67 year old smoker (30 pack years). Followed in our office for mild obstruction on 11/2017 PFTs.  PMH: HTN, DMT2, Hyperlipidemia, AR Maintenance: Spiriva 1.25 Smoking Status/History:  Current smoker (30 pack years) Pt of: Dr. Lamonte Sakai   06/16/2018  - Visit   67 year old female patient presenting today for follow-up after starting on CPAP therapy.  Patient reports that CPAP is going well.  She reports it did take her a few weeks to get adjusted to using it.  Patient reports no concerns using the device.  Patient is using a nasal mask.  CPAP compliance report showing 28 out of 30 days use, 24 those days greater than 4 hours, average usage 4 hours and 39 minutes, APAP settings 5-15, AHI 4.6.  Moderate amount of leaks.  Patient reports she is not using a chinstrap with her mask.  Patient reports she also has ran out of her Spiriva Respimat 1.25 she reports her last dose of that was yesterday.  Patient still remains asymptomatic in regards to COPD.  Patient is still interested in stopping smoking.  She reports she was doing well with reduced to quit method but then developed anxiety regarding stopping smoking and now she is back to smoking almost a pack a day.  MMRC - Breathlessness Score 0 - I will get breathless with strenuous exercise     Tests:  12/11/2017-pulmonary function test- post bronc ratio 77, FEV1 95, no significant bronchodilator response DLCO 71  02/23/2018- Home sleep study results- AHI 12.5 an hour, SaO2 low of 79% with an average of 87%.  Imaging:  11/18/2017-chest x-ray- COPD changes with minimal left basilar atelectasis, mild hyperinflation  Cardiac:  04/03/2017-echocardiogram-LV ejection fraction 55 to  60%  FENO:  No results found for: NITRICOXIDE  PFT: PFT Results Latest Ref Rng & Units 12/11/2017  FVC-Pre L 3.30  FVC-Predicted Pre % 102  FVC-Post L 3.12  FVC-Predicted Post % 97  Pre FEV1/FVC % % 71  Post FEV1/FCV % % 79  FEV1-Pre L 2.34  FEV1-Predicted Pre % 95  FEV1-Post L 2.45  DLCO UNC% % 71  DLCO COR %Predicted % 74  TLC L 6.50  TLC % Predicted % 125  RV % Predicted % 127    Imaging: No results found.  Chart Review:    Specialty Problems      Pulmonary Problems   COPD GOLD 0     12/11/2017-pulmonary function test- post bronch ratio 77, FEV1 95, no significant bronchodilator response DLCO 71      Chronic cough   OSA (obstructive sleep apnea)    02/23/2018- Home sleep study-AHI 12.5 an hour lowest SaO2 was 79% with an average of 87%         Allergies  Allergen Reactions  . Enalapril Swelling    Caused lips to swell and bloating.  Marland Kitchen No Known Allergies   . Repatha [Evolocumab]     Immunization History  Administered Date(s) Administered  . Influenza, High Dose Seasonal PF 04/10/2017  . Influenza-Unspecified 03/05/2018  . PPD Test 09/21/2014  . Pneumococcal Conjugate-13 10/22/2017  . Pneumococcal Polysaccharide-23 02/07/2011, 01/26/2016  . Tdap 02/07/2011    Past Medical History:  Diagnosis Date  .  Allergy    enviromental  . Anxiety   . Arthritis    osteoarthritis  . COPD (chronic obstructive pulmonary disease) (HCC)    mild  . Diabetes mellitus without complication ([place])    controlled by diet  . Environmental allergies   . Hepatitis    hepatitis A  . Hyperlipidemia   . Hypertension   . Mixed hyperlipidemia 06/15/2013  . PONV (postoperative nausea and vomiting)    second surgery no problems last surgery  . Urgency of urination    Takes oxybutynin; under control at this time    Tobacco History: Social History   Tobacco Use  Smoking Status Current Every Day Smoker  . Packs/day: 0.50  . Years: 30.00  . Pack years: 15.00  . Types:  Cigarettes  . Start date: 1985  Smokeless Tobacco Never Used  Tobacco Comment   10 cigarettes/day 02/11/2018   Ready to quit: Yes Counseling given: Yes Comment: 10 cigarettes/day 02/11/2018  Smoking assessment and cessation counseling  Patient currently smoking:1 ppd I have advised the patient to quit/stop smoking as soon as possible due to high risk for multiple medical problems.  It will also be very difficult for [place] to manage patient's  respiratory symptoms and status if we continue to expose her lungs to a known irritant.  We do not advise e-cigarettes as a form of stopping smoking.  Patient is willing to quit smoking. Has not set quit date   I have advised the patient that we can assist and have options of nicotine replacement therapy, provided smoking cessation education today, provided smoking cessation counseling, and provided cessation resources.  Patient has tried Chantix and gave her nightmares.  Patient is currently on Wellbutrin.  Patient is willing to consider nicotine replacement therapies although she admits the nicotine replacement therapies can be expensive.  Follow-up next office visit office visit for assessment of smoking cessation.  Smoking cessation counseling advised for: 5 min   Outpatient Encounter Medications as of 06/16/2018  Medication Sig  . amLODipine (NORVASC) 2.5 MG tablet Take 1 tablet (2.5 mg total) by mouth 2 (two) times daily.  Marland Kitchen aspirin 81 MG tablet Take 81 mg by mouth every other day.   . bisoprolol-hydrochlorothiazide (ZIAC) 10-6.25 MG tablet Take 0.5 tablets by mouth daily.  Marland Kitchen buPROPion (WELLBUTRIN XL) 150 MG 24 hr tablet TAKE 1 TABLET(150 MG) BY MOUTH EVERY MORNING  . Cholecalciferol (VITAMIN D3) 5000 UNITS TABS Take 5,000 Units by mouth daily.  . clonazePAM (KLONOPIN) 0.5 MG tablet TAKE 1/2-1 TABLET BY MOUTH AT BEDTIME FOR SLEEP (Patient taking differently: at bedtime as needed. TAKE 1/2-1 TABLET BY MOUTH AT BEDTIME FOR SLEEP)  .  dextromethorphan-guaiFENesin (MUCINEX DM) 30-600 MG 12hr tablet Take 1 tablet by mouth 2 (two) times daily.  . fluticasone (FLONASE) 50 MCG/ACT nasal spray 1 Spray each nostril daily  . losartan (COZAAR) 100 MG tablet TAKE 1 TABLET BY MOUTH DAILY FOR BLOOD PRESSURE  . meloxicam (MOBIC) 15 MG tablet TAKE 1 TABLET BY MOUTH DAILY  . methylcellulose oral powder Take by mouth 3 (three) times daily as needed.  . Multiple Vitamins-Minerals (MULTI COMPLETE PO) Take 1 capsule by mouth daily.  Marland Kitchen NIFEdipine (PROCARDIA XL/ADALAT-CC) 60 MG 24 hr tablet TAKE 1 TABLET BY MOUTH EVERY NIGHT FOR BLOOD PRESSURE  . Omega-3 Fatty Acids (FISH OIL) 1000 MG CAPS Take 1 capsule by mouth daily.  Marland Kitchen omeprazole (PRILOSEC) 20 MG capsule TAKE 1 CAPSULE(20 MG) BY MOUTH EVERY DAY  . Tiotropium Bromide Monohydrate (SPIRIVA  RESPIMAT) 1.25 MCG/ACT AERS Inhale 2 Inhalers into the lungs daily.  . Tiotropium Bromide Monohydrate (SPIRIVA RESPIMAT) 1.25 MCG/ACT AERS Inhale 2 puffs into the lungs daily.  . [DISCONTINUED] montelukast (SINGULAIR) 10 MG tablet TAKE 1 TABLET(10 MG) BY MOUTH DAILY (Patient not taking: Reported on 06/16/2018)   No facility-administered encounter medications on file as of 06/16/2018.      Review of Systems  Review of Systems  Constitutional: Negative for chills, fatigue, fever and unexpected weight change.  HENT: Negative for postnasal drip, sinus pressure and sinus pain.   Respiratory: Negative for cough, chest tightness, shortness of breath and wheezing.   Cardiovascular: Negative for chest pain and palpitations.  Gastrointestinal: Negative for diarrhea, nausea and vomiting.  Musculoskeletal: Negative for arthralgias.  Skin: Negative for color change.  Allergic/Immunologic: Positive for environmental allergies (Patient reports seasonal allergies mainly in spring and summer). Negative for food allergies.  Neurological: Negative for dizziness, light-headedness and headaches.  Psychiatric/Behavioral:  Negative for dysphoric mood. The patient is nervous/anxious (Occasional anxiety especially when considering stopping smoking).   All other systems reviewed and are negative.    Physical Exam  BP (!) 142/74 (BP Location: Left Arm, Cuff Size: Normal)   Pulse 71   Ht 5\' 6"  (1.676 m)   Wt 162 lb (73.5 kg)   SpO2 94%   BMI 26.15 kg/m   Wt Readings from Last 5 Encounters:  06/16/18 162 lb (73.5 kg)  05/13/18 159 lb 6.4 oz (72.3 kg)  05/08/18 160 lb (72.6 kg)  04/17/18 162 lb 9.6 oz (73.8 kg)  03/20/18 159 lb 6.4 oz (72.3 kg)     Physical Exam  Constitutional: She is oriented to person, place, and time and well-developed, well-nourished, and in no distress. No distress.  HENT:  Head: Normocephalic and atraumatic.  Right Ear: Hearing, tympanic membrane, external ear and ear canal normal.  Left Ear: Hearing, tympanic membrane, external ear and ear canal normal.  Nose: Mucosal edema present. Right sinus exhibits no maxillary sinus tenderness and no frontal sinus tenderness. Left sinus exhibits no maxillary sinus tenderness and no frontal sinus tenderness.  Mouth/Throat: Uvula is midline and oropharynx is clear and moist. No oropharyngeal exudate.  +PND  Eyes: Pupils are equal, round, and reactive to light.  Neck: Normal range of motion. Neck supple. No JVD present.  Cardiovascular: Normal rate, regular rhythm and normal heart sounds.  Pulmonary/Chest: Effort normal and breath sounds normal. No accessory muscle usage. No respiratory distress. She has no decreased breath sounds. She has no wheezes. She has no rhonchi.  Musculoskeletal: Normal range of motion.  Lymphadenopathy:    She has no cervical adenopathy.  Neurological: She is alert and oriented to person, place, and time. Gait normal.  Skin: Skin is warm and dry. She is not diaphoretic. No erythema.  Psychiatric: Mood, memory, affect and judgment normal.  Nursing note and vitals reviewed.     Lab Results:  CBC      Component Value Date/Time   WBC 6.5 04/17/2018 1516   RBC 4.83 04/17/2018 1516   HGB 13.2 04/17/2018 1516   HCT 39.7 04/17/2018 1516   PLT 260 04/17/2018 1516   MCV 82.2 04/17/2018 1516   MCH 27.3 04/17/2018 1516   MCHC 33.2 04/17/2018 1516   RDW 14.1 04/17/2018 1516   LYMPHSABS 2,613 04/17/2018 1516   MONOABS 496 12/18/2016 1225   EOSABS 163 04/17/2018 1516   BASOSABS 33 04/17/2018 1516    BMET    Component Value Date/Time  NA 138 05/08/2018 1117   K 4.9 05/08/2018 1117   CL 103 05/08/2018 1117   CO2 29 05/08/2018 1117   GLUCOSE 95 05/08/2018 1117   BUN 12 05/08/2018 1117   CREATININE 0.88 05/08/2018 1117   CALCIUM 9.5 05/08/2018 1117   GFRNONAA 68 05/08/2018 1117   GFRAA 79 05/08/2018 1117    BNP No results found for: BNP  ProBNP No results found for: PROBNP    Assessment & Plan:   Pleasant 67 year old female patient completing follow-up with our office today.  Patient to continue on CPAP therapy.  Patient to start using chinstrap with her nasal mask.  Patient to continue Spiriva 1.25 daily.  Patient to work on stopping smoking.  Patient to follow-up in 6 months or sooner if symptoms are not improving or she has difficulty stopping smoking  OSA (obstructive sleep apnea)  Note your daily symptoms > remember "red flags" for COPD:   >>>Increase in cough >>>increase in sputum production >>>increase in shortness of breath or activity  intolerance.   If you notice these symptoms, please call the office to be seen.    We recommend that you continue using your CPAP daily >>>Keep up the hard work using your device >>> Goal should be wearing this for the entire night that you are sleeping, at least 4 to 6 hours  Remember:  . Do not drive or operate heavy machinery if tired or drowsy.  . Please notify the supply company and office if you are unable to use your device regularly due to missing supplies or machine being broken.  . Work on maintaining a healthy  weight and following your recommended nutrition plan  . Maintain proper daily exercise and movement  . Maintaining proper use of your device can also help improve management of other chronic illnesses such as: Blood pressure, blood sugars, and weight management.   BiPAP/ CPAP Cleaning:  >>>Clean weekly, with Dawn soap, and bottle brush.  Set up to air dry.  Follow-up in 6 months or sooner if symptoms are worsening    COPD GOLD 0  Spiriva Respimat 1.25 >>> 2 puffs daily >>> Do this every day >>>This is not a rescue inhaler  We recommend that you stop smoking.  >>>You need to set a quit date  Follow-up in 6 months or sooner if symptoms are worsening    Current smoker  We recommend that you stop smoking.  >>>You need to set a quit date >>>If you have friends or family who smoke, let them know you are trying to quit and not to smoke around you or in your living environment  Smoking Cessation Resources:  1 800 QUIT NOW  >>> Patient to call this resource and utilize it to help support her quit smoking >>> Keep up your hard work with stopping smoking  You can also contact the [facility] >>>For smoking cessation classes call [phone]  We do not recommend using e-cigarettes as a form of stopping smoking  You can sign up for smoking cessation support texts and information:  >>>https://smokefree.gov/smokefreetxt  Nicotine patches: >>>Make sure you rotate sites that you do not get skin irritation, Apply 1 patch each morning to a non-hairy skin site  If you are smoking greater than 10 cigarettes/day and weigh over 45 kg start with the nicotine patch of 21 mg a day for 6 weeks, then 14 mg a day for 2 weeks, then finished with 7 mg a day for 2 weeks, then stop  If you are smoking less than 10 cigarettes a day or weight less than 45 kg start with medium dose pack of 14 mg a day for 6 weeks, followed by 7 mg a day for 2 weeks   >>>If insomnia occurs you are having  trouble sleeping you can take the patch off at night, and place a new one on in the morning >>>If the patch is removed at night and you have morning cravings start short acting nicotine replacement therapy such as gum or lozenges  Nicotine Gum:  >>>Smokers who smoke 25 or more cigarettes a day should use 4 mg dose >>>Smokers who smoke fewer than 25 cigarettes a day should use 2 mg dose  Proper chewing of gum is important for optimal results.   >>>Do not chew gum to rapidly.  Once you start chewing eating tasty peppery taste and slide the gum to your cheek.  When the taste disappears to a few more times.  Repeat this for 30 minutes.  Then discard the gum.  >>>Avoid acidic beverages such as coffee, carbonated beverages before and during gum use.  A soft acidic beverages lower oral pH which cause nicotine to not be absorbed properly >>>If you chew the gum too quickly or vigorously you could have nausea, vomiting, abdominal pain, constipation, hiccups, headache, sore jaw, mouth irritation ulcers  Nicotine lozenge: Lozenges are commonly uses short acting NRT product  >>>Smokers who smoke within 30 minutes of awakening should use 4 mg dose >>>Smokers who wait more than 30 minutes after awakening to smoke should use 2 mg dose  Can use up to 1 lozenge every 1-2 hours for 6 weeks >>>Total amount of lozenges that can be used per day as 20 >>>Gradually reduce number of lozenges used per day after 2 weeks of use  Place lozenge in mouth and allowed to dissolve for 30 minutes loss and does not need to be chewed  Lozenges have advantages to be able to be used in people with TMG, poor dentition, dentures    Follow-up in 6 months or sooner if symptoms are worsening    This appointment was 28 minutes along with over 50% of that time in direct face-to-face patient care, assessment, plan of care follow-up and discussion   Sherri Rinne, NP 06/16/2018

## 2018-06-16 ENCOUNTER — Ambulatory Visit (INDEPENDENT_AMBULATORY_CARE_PROVIDER_SITE_OTHER): Payer: PPO | Admitting: Pulmonary Disease

## 2018-06-16 ENCOUNTER — Encounter: Payer: Self-pay | Admitting: Pulmonary Disease

## 2018-06-16 DIAGNOSIS — F17210 Nicotine dependence, cigarettes, uncomplicated: Secondary | ICD-10-CM | POA: Diagnosis not present

## 2018-06-16 DIAGNOSIS — G4733 Obstructive sleep apnea (adult) (pediatric): Secondary | ICD-10-CM | POA: Diagnosis not present

## 2018-06-16 DIAGNOSIS — J449 Chronic obstructive pulmonary disease, unspecified: Secondary | ICD-10-CM | POA: Diagnosis not present

## 2018-06-16 DIAGNOSIS — F17200 Nicotine dependence, unspecified, uncomplicated: Secondary | ICD-10-CM

## 2018-06-16 MED ORDER — TIOTROPIUM BROMIDE MONOHYDRATE 1.25 MCG/ACT IN AERS: 2.0000 | INHALATION_SPRAY | Freq: Every day | RESPIRATORY_TRACT | 0 refills | Status: DC

## 2018-06-16 NOTE — Assessment & Plan Note (Addendum)
Patient to start using chinstrap with nasal mask to see if that helps with leaks  We recommend that you continue using your CPAP daily >>>Keep up the hard work using your device >>> Goal should be wearing this for the entire night that you are sleeping, at least 4 to 6 hours  Remember:  . Do not drive or operate heavy machinery if tired or drowsy.  . Please notify the supply company and office if you are unable to use your device regularly due to missing supplies or machine being broken.  . Work on maintaining a healthy weight and following your recommended nutrition plan  . Maintain proper daily exercise and movement  . Maintaining proper use of your device can also help improve management of other chronic illnesses such as: Blood pressure, blood sugars, and weight management.   BiPAP/ CPAP Cleaning:  >>>Clean weekly, with Dawn soap, and bottle brush.  Set up to air dry.  Follow-up in 6 months or sooner if symptoms are worsening

## 2018-06-16 NOTE — Assessment & Plan Note (Signed)
  We recommend that you stop smoking.  >>>You need to set a quit date >>>If you have friends or family who smoke, let them know you are trying to quit and not to smoke around you or in your living environment  Smoking Cessation Resources:  1 800 QUIT NOW  >>> Patient to call this resource and utilize it to help support her quit smoking >>> Keep up your hard work with stopping smoking  You can also contact the [facility] >>>For smoking cessation classes call [phone]  We do not recommend using e-cigarettes as a form of stopping smoking  You can sign up for smoking cessation support texts and information:  >>>https://smokefree.gov/smokefreetxt  Nicotine patches: >>>Make sure you rotate sites that you do not get skin irritation, Apply 1 patch each morning to a non-hairy skin site  If you are smoking greater than 10 cigarettes/day and weigh over 45 kg start with the nicotine patch of 21 mg a day for 6 weeks, then 14 mg a day for 2 weeks, then finished with 7 mg a day for 2 weeks, then stop  If you are smoking less than 10 cigarettes a day or weight less than 45 kg start with medium dose pack of 14 mg a day for 6 weeks, followed by 7 mg a day for 2 weeks   >>>If insomnia occurs you are having trouble sleeping you can take the patch off at night, and place a new one on in the morning >>>If the patch is removed at night and you have morning cravings start short acting nicotine replacement therapy such as gum or lozenges  Nicotine Gum:  >>>Smokers who smoke 25 or more cigarettes a day should use 4 mg dose >>>Smokers who smoke fewer than 25 cigarettes a day should use 2 mg dose  Proper chewing of gum is important for optimal results.   >>>Do not chew gum to rapidly.  Once you start chewing eating tasty peppery taste and slide the gum to your cheek.  When the taste disappears to a few more times.  Repeat this for 30 minutes.  Then discard the gum.  >>>Avoid acidic  beverages such as coffee, carbonated beverages before and during gum use.  A soft acidic beverages lower oral pH which cause nicotine to not be absorbed properly >>>If you chew the gum too quickly or vigorously you could have nausea, vomiting, abdominal pain, constipation, hiccups, headache, sore jaw, mouth irritation ulcers  Nicotine lozenge: Lozenges are commonly uses short acting NRT product  >>>Smokers who smoke within 30 minutes of awakening should use 4 mg dose >>>Smokers who wait more than 30 minutes after awakening to smoke should use 2 mg dose  Can use up to 1 lozenge every 1-2 hours for 6 weeks >>>Total amount of lozenges that can be used per day as 20 >>>Gradually reduce number of lozenges used per day after 2 weeks of use  Place lozenge in mouth and allowed to dissolve for 30 minutes loss and does not need to be chewed  Lozenges have advantages to be able to be used in people with TMG, poor dentition, dentures    Follow-up in 6 months or sooner if symptoms are worsening

## 2018-06-16 NOTE — Patient Instructions (Signed)
Spiriva Respimat 1.25 >>> 2 puffs daily >>> Do this every day >>>This is not a rescue inhaler  Note your daily symptoms > remember "red flags" for COPD:   >>>Increase in cough >>>increase in sputum production >>>increase in shortness of breath or activity  intolerance.   If you notice these symptoms, please call the office to be seen.    We recommend that you continue using your CPAP daily >>>Keep up the hard work using your device >>> Goal should be wearing this for the entire night that you are sleeping, at least 4 to 6 hours  Remember:  . Do not drive or operate heavy machinery if tired or drowsy.  . Please notify the supply company and office if you are unable to use your device regularly due to missing supplies or machine being broken.  . Work on maintaining a healthy weight and following your recommended nutrition plan  . Maintain proper daily exercise and movement  . Maintaining proper use of your device can also help improve management of other chronic illnesses such as: Blood pressure, blood sugars, and weight management.   BiPAP/ CPAP Cleaning:  >>>Clean weekly, with Dawn soap, and bottle brush.  Set up to air dry.       We recommend that you stop smoking.  >>>You need to set a quit date >>>If you have friends or family who smoke, let them know you are trying to quit and not to smoke around you or in your living environment  Smoking Cessation Resources:  1 800 QUIT NOW  >>> Patient to call this resource and utilize it to help support her quit smoking >>> Keep up your hard work with stopping smoking  You can also contact the [facility] >>>For smoking cessation classes call [phone]  We do not recommend using e-cigarettes as a form of stopping smoking  You can sign up for smoking cessation support texts and information:  >>>https://smokefree.gov/smokefreetxt  Nicotine patches: >>>Make sure you rotate sites that you do not get skin  irritation, Apply 1 patch each morning to a non-hairy skin site  If you are smoking greater than 10 cigarettes/day and weigh over 45 kg start with the nicotine patch of 21 mg a day for 6 weeks, then 14 mg a day for 2 weeks, then finished with 7 mg a day for 2 weeks, then stop  If you are smoking less than 10 cigarettes a day or weight less than 45 kg start with medium dose pack of 14 mg a day for 6 weeks, followed by 7 mg a day for 2 weeks   >>>If insomnia occurs you are having trouble sleeping you can take the patch off at night, and place a new one on in the morning >>>If the patch is removed at night and you have morning cravings start short acting nicotine replacement therapy such as gum or lozenges  Nicotine Gum:  >>>Smokers who smoke 25 or more cigarettes a day should use 4 mg dose >>>Smokers who smoke fewer than 25 cigarettes a day should use 2 mg dose  Proper chewing of gum is important for optimal results.   >>>Do not chew gum to rapidly.  Once you start chewing eating tasty peppery taste and slide the gum to your cheek.  When the taste disappears to a few more times.  Repeat this for 30 minutes.  Then discard the gum.  >>>Avoid acidic beverages such as coffee, carbonated beverages before and during gum use.  A soft  acidic beverages lower oral pH which cause nicotine to not be absorbed properly >>>If you chew the gum too quickly or vigorously you could have nausea, vomiting, abdominal pain, constipation, hiccups, headache, sore jaw, mouth irritation ulcers  Nicotine lozenge: Lozenges are commonly uses short acting NRT product  >>>Smokers who smoke within 30 minutes of awakening should use 4 mg dose >>>Smokers who wait more than 30 minutes after awakening to smoke should use 2 mg dose  Can use up to 1 lozenge every 1-2 hours for 6 weeks >>>Total amount of lozenges that can be used per day as 20 >>>Gradually reduce number of lozenges used per day after 2 weeks of use  Place  lozenge in mouth and allowed to dissolve for 30 minutes loss and does not need to be chewed  Lozenges have advantages to be able to be used in people with TMG, poor dentition, dentures    Follow-up in 6 months or sooner if symptoms are worsening    It is flu season:   >>>Remember to be washing your hands regularly, using hand sanitizer, be careful to use around herself with has contact with people who are sick will increase her chances of getting sick yourself. >>> Best ways to protect herself from the flu: Receive the yearly flu vaccine, practice good hand hygiene washing with soap and also using hand sanitizer when available, eat a nutritious meals, get adequate rest, hydrate appropriately   Please contact the office if your symptoms worsen or you have concerns that you are not improving.   Thank you for choosing [facility] for your healthcare, and for allowing [place] to partner with you on your healthcare journey. I am thankful to be able to provide care to you today.   Wyn Quaker FNP-C

## 2018-06-16 NOTE — Assessment & Plan Note (Signed)
Spiriva Respimat 1.25 >>> 2 puffs daily >>> Do this every day >>>This is not a rescue inhaler  We recommend that you stop smoking.  >>>You need to set a quit date  Follow-up in 6 months or sooner if symptoms are worsening

## 2018-06-23 ENCOUNTER — Other Ambulatory Visit: Payer: Self-pay | Admitting: Cardiology

## 2018-06-23 ENCOUNTER — Other Ambulatory Visit: Payer: Self-pay | Admitting: Adult Health

## 2018-06-23 ENCOUNTER — Other Ambulatory Visit: Payer: Self-pay | Admitting: Internal Medicine

## 2018-06-23 DIAGNOSIS — R1013 Epigastric pain: Secondary | ICD-10-CM

## 2018-06-23 DIAGNOSIS — R079 Chest pain, unspecified: Secondary | ICD-10-CM

## 2018-06-23 NOTE — Telephone Encounter (Signed)
Rx request sent to pharmacy.  

## 2018-06-25 NOTE — Telephone Encounter (Signed)
Rx request sent to pharmacy.  

## 2018-07-13 DIAGNOSIS — G4733 Obstructive sleep apnea (adult) (pediatric): Secondary | ICD-10-CM | POA: Diagnosis not present

## 2018-07-19 ENCOUNTER — Other Ambulatory Visit: Payer: Self-pay | Admitting: Internal Medicine

## 2018-07-22 ENCOUNTER — Other Ambulatory Visit: Payer: Self-pay

## 2018-07-22 MED ORDER — BISOPROLOL-HYDROCHLOROTHIAZIDE 10-6.25 MG PO TABS: 0.5000 | ORAL_TABLET | Freq: Every day | ORAL | 3 refills | Status: DC

## 2018-07-22 NOTE — Telephone Encounter (Signed)
Refilled rqst received for Ziac. Ziac 10-6.25 1/2 tablet daily refilled #45 R-3.

## 2018-07-24 ENCOUNTER — Ambulatory Visit (INDEPENDENT_AMBULATORY_CARE_PROVIDER_SITE_OTHER): Payer: PPO | Admitting: Adult Health

## 2018-07-24 ENCOUNTER — Ambulatory Visit: Payer: Self-pay | Admitting: Internal Medicine

## 2018-07-24 ENCOUNTER — Encounter: Payer: Self-pay | Admitting: Adult Health

## 2018-07-24 VITALS — BP 132/74 | HR 56 | Temp 97.3°F | Ht 66.0 in | Wt 164.6 lb

## 2018-07-24 DIAGNOSIS — Z79899 Other long term (current) drug therapy: Secondary | ICD-10-CM

## 2018-07-24 DIAGNOSIS — K219 Gastro-esophageal reflux disease without esophagitis: Secondary | ICD-10-CM | POA: Diagnosis not present

## 2018-07-24 DIAGNOSIS — R6889 Other general symptoms and signs: Secondary | ICD-10-CM | POA: Diagnosis not present

## 2018-07-24 DIAGNOSIS — G4733 Obstructive sleep apnea (adult) (pediatric): Secondary | ICD-10-CM | POA: Diagnosis not present

## 2018-07-24 DIAGNOSIS — R7309 Other abnormal glucose: Secondary | ICD-10-CM | POA: Diagnosis not present

## 2018-07-24 DIAGNOSIS — F419 Anxiety disorder, unspecified: Secondary | ICD-10-CM | POA: Diagnosis not present

## 2018-07-24 DIAGNOSIS — E782 Mixed hyperlipidemia: Secondary | ICD-10-CM

## 2018-07-24 DIAGNOSIS — J449 Chronic obstructive pulmonary disease, unspecified: Secondary | ICD-10-CM

## 2018-07-24 DIAGNOSIS — J410 Simple chronic bronchitis: Secondary | ICD-10-CM

## 2018-07-24 DIAGNOSIS — F17200 Nicotine dependence, unspecified, uncomplicated: Secondary | ICD-10-CM | POA: Diagnosis not present

## 2018-07-24 DIAGNOSIS — I700 Atherosclerosis of aorta: Secondary | ICD-10-CM

## 2018-07-24 DIAGNOSIS — R05 Cough: Secondary | ICD-10-CM

## 2018-07-24 DIAGNOSIS — I10 Essential (primary) hypertension: Secondary | ICD-10-CM | POA: Diagnosis not present

## 2018-07-24 DIAGNOSIS — E559 Vitamin D deficiency, unspecified: Secondary | ICD-10-CM | POA: Diagnosis not present

## 2018-07-24 DIAGNOSIS — Z0000 Encounter for general adult medical examination without abnormal findings: Secondary | ICD-10-CM

## 2018-07-24 DIAGNOSIS — Z0001 Encounter for general adult medical examination with abnormal findings: Secondary | ICD-10-CM

## 2018-07-24 DIAGNOSIS — R053 Chronic cough: Secondary | ICD-10-CM

## 2018-07-24 NOTE — Progress Notes (Signed)
MEDICARE ANNUAL WELLNESS VISIT AND FOLLOW UP  Assessment:   Diagnoses and all orders for this visit:  Encounter for Medicare annual wellness exam  COPD 0 Stop smoking, followed by pulm  OSA on CPAP - continue CPAP, CPAP is helping with daytime fatigue, weight loss still advised.   Essential hypertension Continue medications Monitor blood pressure at home; call if consistently over 130/80 Continue DASH diet.   Reminder to go to the ER if any CP, SOB, nausea, dizziness, severe HA, changes vision/speech, left arm numbness and tingling and jaw pain.  Aortic atherosclerosis by Abd CT scan [facility]) Control blood pressure, cholesterol, glucose, increase exercise.   Gastroesophageal reflux disease, esophagitis presence not specified Increase omeprazole to 40 mg in AM x 2 weeks, add famotidine 20-40 mg in the evening if needed, call back if not improving in 2-3 weeks can refer back to GI - known hiatal hernia Discussed diet, avoiding triggers and other lifestyle changes  Vitamin D deficiency Continue supplementation for goal 70-100 Check vitamin D level  Other abnormal glucose Recent A1Cs at goal Discussed diet/exercise, weight management  Defer A1C; check CMP  Medication management CBC, BMP/GFR, LFTs  Hyperlipidemia, mixed Continue medications: newly on praluent Continue low cholesterol diet and exercise.  Check lipid panel, TSH   Chronic cough/smokers cough Seen by ENT/pulm; smokers cough+ allergies/GERD   Smoker  Discussed risks associated with tobacco use and advised to reduce or quit Will follow up at the next visit   Anxiety/insomnia Recently improved, rare use of benzo  good sleep hygiene discussed, increase day time activity  Over C0 minutes of exam, counseling, chart review and critical decision making was performed Future Appointments  Date Time Provider [place]  12/17/2018 12:00 PM Lauraine Rinne, NP [facility] None  02/04/2019  9:00 AM Unk Pinto, MD [facility] None     Plan:   During the course of the visit the patient was educated and counseled about appropriate screening and preventive services including:    Pneumococcal vaccine   Prevnar 13  Influenza vaccine  Td vaccine  Screening electrocardiogram  Bone densitometry screening  Colorectal cancer screening  Diabetes screening  Glaucoma screening  Nutrition counseling   Advanced directives: requested   Subjective:  [name] is a 68 y.o. female who presents for Medicare Annual Wellness Visit and 3 month follow up.   She had sleep study in 2019 and newly diagnosed OSA on CPAP, she endorses every other night use, 4-5 hours per night. She does feel more energized in the AM when she does wear, but she tends to toss and turn and dislodges and doesn't bother to put back on.   she has a diagnosis of anxiety and is currently on clonazepam 0.25 - 0.5 mg, reports symptoms are fairly controlled on current regimen. she reports use is highly variable, recently hasn't used in weeks.   she has a diagnosis of GERD which is currently managedprilosec 20 mg daily in AM. Off of ranitidine due to recall.  she reports symptoms is not currently well controlled; she reports ongoing hoarseness, burning in her throat/mouth, has sensation of lump in her throat, "tightness" with attempts to swallow, nagging cough.   She is a smoker, currently smoking 3/4 pack daily. She reports ongoing hoarseness that is worse over the course of several years. She is trying to cut down on smoking. She plans to cut back, on wellbutrin and this does help.   BMI is Body mass index is 26.57 kg/m., she  has been working on diet and exercise. Goes to water aerobics 3 days a week for an hour each visit.  Wt Readings from Last 3 Encounters:  07/24/18 164 lb 9.6 oz (74.7 kg)  06/16/18 162 lb (73.5 kg)  05/13/18 159 lb 6.4 oz (72.3 kg)    Her blood pressure has been controlled at home, today  their BP is BP: 132/74 She does workout. She denies chest pain, shortness of breath, dizziness.   She is on cholesterol medication (she is newly on praluent injectible via cardiology, had allergic reaction repatha) and denies myalgias. Her cholesterol is not at goal. The cholesterol last visit was:   Lab Results  Component Value Date   CHOL 186 04/17/2018   HDL 86 04/17/2018   LDLCALC 75 04/17/2018   TRIG 152 (H) 04/17/2018   CHOLHDL 2.2 04/17/2018    She has been working on diet and exercise for glucose management, and denies foot ulcerations, increased appetite, nausea, paresthesia of the feet, polydipsia, polyuria, visual disturbances, vomiting and weight loss. Last A1C in the office was:  Lab Results  Component Value Date   HGBA1C 5.3 01/08/2018   Last GFR: Lab Results  Component Value Date   GFRNONAA 68 05/08/2018   Patient is on Vitamin D supplement but remained below goal of 70 at most recent check:   Lab Results  Component Value Date   VD25OH 84 01/08/2018      Medication Review: Current Outpatient Medications on File Prior to Visit  Medication Sig Dispense Refill  . Alirocumab (PRALUENT) 75 MG/ML SOAJ Inject 75 mg into the skin every 14 (fourteen) days.    Marland Kitchen amLODipine (NORVASC) 2.5 MG tablet Take 1 tablet (2.5 mg total) by mouth 2 (two) times daily. 60 tablet 5  . aspirin 81 MG tablet Take 81 mg by mouth every other day.     . bisoprolol-hydrochlorothiazide (ZIAC) 10-6.25 MG tablet Take 0.5 tablets by mouth daily. 45 tablet 3  . buPROPion (WELLBUTRIN XL) 150 MG 24 hr tablet TAKE 1 TABLET(150 MG) BY MOUTH EVERY MORNING 90 tablet 0  . Cholecalciferol (VITAMIN D3) 5000 UNITS TABS Take 5,000 Units by mouth daily.    . clonazePAM (KLONOPIN) 0.5 MG tablet TAKE 1/2-1 TABLET BY MOUTH AT BEDTIME FOR SLEEP (Patient taking differently: at bedtime as needed. TAKE 1/2-1 TABLET BY MOUTH AT BEDTIME FOR SLEEP) 30 tablet 0  . dextromethorphan-guaiFENesin (MUCINEX DM) 30-600 MG 12hr  tablet Take 1 tablet by mouth 2 (two) times daily.    . fluticasone (FLONASE) 50 MCG/ACT nasal spray 1 Spray each nostril daily 48 g 1  . losartan (COZAAR) 100 MG tablet TAKE 1 TABLET BY MOUTH DAILY FOR BLOOD PRESSURE 90 tablet 0  . meloxicam (MOBIC) 15 MG tablet TAKE 1 TABLET BY MOUTH DAILY 90 tablet 3  . methylcellulose oral powder Take by mouth 3 (three) times daily as needed.    . Multiple Vitamins-Minerals (MULTI COMPLETE PO) Take 1 capsule by mouth daily.    Marland Kitchen NIFEdipine (PROCARDIA XL/ADALAT-CC) 60 MG 24 hr tablet TAKE 1 TABLET BY MOUTH EVERY NIGHT FOR BLOOD PRESSURE 90 tablet 1  . Omega-3 Fatty Acids (FISH OIL) 1000 MG CAPS Take 1 capsule by mouth daily.    Marland Kitchen omeprazole (PRILOSEC) 20 MG capsule TAKE 1 CAPSULE(20 MG) BY MOUTH EVERY DAY 90 capsule 1  . Tiotropium Bromide Monohydrate (SPIRIVA RESPIMAT) 1.25 MCG/ACT AERS Inhale 2 Inhalers into the lungs daily.     No current facility-administered medications on file prior to visit.  Allergies  Allergen Reactions  . Enalapril Swelling    Caused lips to swell and bloating.  Marland Kitchen No Known Allergies   . Repatha [Evolocumab]     Current Problems (verified) Patient Active Problem List   Diagnosis Date Noted  . Smokers' cough ([place]) 07/24/2018  . OSA (obstructive sleep apnea) 02/26/2018  . Chronic cough 12/12/2017  . COPD GOLD 0  12/02/2017  . Anxiety 10/22/2017  . Current smoker 10/22/2017  . Encounter for Medicare annual wellness exam 10/21/2017  . Aortic atherosclerosis by Abd CT scan ([place]) 05/16/2017  . GERD (gastroesophageal reflux disease) 04/10/2017  . Other abnormal glucose 03/20/2014  . Medication management 03/20/2014  . Essential hypertension 06/15/2013  . Hyperlipidemia, mixed 06/15/2013  . Vitamin D deficiency 06/15/2013    Screening Tests Immunization History  Administered Date(s) Administered  . Influenza, High Dose Seasonal PF 04/10/2017  . Influenza-Unspecified 03/05/2018  . PPD Test 09/21/2014  .  Pneumococcal Conjugate-13 10/22/2017  . Pneumococcal Polysaccharide-23 02/07/2011, 01/26/2016  . Tdap 02/07/2011   Preventative care: Last colonoscopy: 2015, 10 year follow up Mammogram:  05/2017, at Obgyn, will schedule follow up Pelvic exam: at OBGYN, never abnormal, sees [facility], last 2018 Dexa: obgyn has been handling  Prior vaccinations: TD or Tdap: 2012  Influenza: 2019  Pneumococcal: 2012, 2017 Prevnar13: 2019 Shingles/Zostavax: will check with insurance  Names of Other Physician/Practitioners you currently use: 1. [facility] here for primary care 2. [facility], eye doctor, last visit 2018 3. Dr. Purcell [facility], dentist, last visit 2019, goes q58m  Patient Care Team: Unk Pinto, MD as PCP - General (Internal Medicine)  SURGICAL HISTORY She  has a past surgical history that includes Appendectomy; Partial hysterectomy; Lumbar fusion (02/06/2012); Back surgery; Dilation and curettage of uterus; Total hip arthroplasty (06/02/2012); Colonoscopy; Total knee arthroplasty (Right, 12/15/2012); Total hip arthroplasty (Left, 01/25/2016); Total hip arthroplasty (Left, 01/25/2016); and Total knee arthroplasty (Left, 06/18/2016). FAMILY HISTORY Her family history includes CAD in her father; Colon cancer in her mother and sister; Diabetes in her mother; Hypertension in her brother, brother, and father; Lung cancer in her brother. SOCIAL HISTORY She  reports that she has been smoking cigarettes. She started smoking about 35 years ago. She has a 22.50 pack-year smoking history. She has never used smokeless tobacco. She reports current alcohol use of about 14.0 standard drinks of alcohol per week. She reports current drug use. Drug: Marijuana.   MEDICARE WELLNESS OBJECTIVES: Physical activity: Current Exercise Habits: Home exercise routine, Type of exercise: Other - see comments(water aerobics), Time (Minutes): 60, Frequency (Times/Week): 3,  Weekly Exercise (Minutes/Week): 180, Intensity: Mild, Exercise limited by: None identified Cardiac risk factors: Cardiac Risk Factors include: advanced age (>86men, >39 women);hypertension;dyslipidemia;smoking/ tobacco exposure Depression/mood screen:   Depression screen [facility] 2/9 07/24/2018  Decreased Interest 0  Down, Depressed, Hopeless 0  PHQ - 2 Score 0    ADLs:  In your present state of health, do you have any difficulty performing the following activities: 07/24/2018 03/21/2018  Hearing? N N  Vision? N N  Difficulty concentrating or making decisions? N N  Walking or climbing stairs? N N  Dressing or bathing? N N  Doing errands, shopping? N N  Some recent data might be hidden     Cognitive Testing  Alert? Yes  Normal Appearance?Yes  Oriented to person? Yes  Place? Yes   Time? Yes  Recall of three objects?  Yes  Can perform simple calculations? Yes  Displays appropriate judgment?Yes  Can  read the correct time from a watch face?Yes  EOL planning: Does Patient Have a Medical Advance Directive?: No Would patient like information on creating a medical advance directive?: Yes (MAU/Ambulatory/Procedural Areas - Information given)  Review of Systems  Constitutional: Negative for malaise/fatigue and weight loss.  HENT: Negative for congestion, hearing loss, sore throat and tinnitus.   Eyes: Negative for blurred vision and double vision.  Respiratory: Positive for cough and sputum production. Negative for shortness of breath and wheezing.   Cardiovascular: Negative for chest pain, palpitations, orthopnea, claudication, leg swelling and PND.  Gastrointestinal: Positive for heartburn. Negative for abdominal pain, blood in stool, constipation, diarrhea, melena, nausea and vomiting.  Genitourinary: Negative.   Musculoskeletal: Negative for falls, joint pain and myalgias.  Skin: Negative for rash.  Neurological: Negative for dizziness, tingling, sensory change, weakness and headaches.   Endo/Heme/Allergies: Negative for environmental allergies and polydipsia.  Psychiatric/Behavioral: Negative.  Negative for depression, memory loss, substance abuse and suicidal ideas. The patient is not nervous/anxious and does not have insomnia.   All other systems reviewed and are negative.    Objective:     Today's Vitals   07/24/18 1605  BP: 132/74  Pulse: (!) 56  Temp: (!) 97.3 F (36.3 C)  SpO2: 97%  Weight: 164 lb 9.6 oz (74.7 kg)  Height: 5\' 6"  (1.676 m)   Body mass index is 26.57 kg/m.  General appearance: alert, no distress, WD/WN, female HEENT: normocephalic, sclerae anicteric, TMs pearly, nares patent, no discharge or erythema, pharynx normal Oral cavity: MMM, no lesions Neck: supple, no lymphadenopathy, no thyromegaly, no masses Heart: RRR, normal S1, S2, no murmurs Lungs: Scattered rhonchi to bilateral bases without wheezes or rales Abdomen: +bs, soft, non tender, non distended, no masses, no hepatomegaly, no splenomegaly Musculoskeletal: nontender, no swelling, no obvious deformity Extremities: no edema, no cyanosis, no clubbing Pulses: 2+ symmetric, upper and lower extremities, normal cap refill Neurological: alert, oriented x 3, CN2-12 intact, strength normal upper extremities and lower extremities, sensation normal throughout, DTRs 2+ throughout, no cerebellar signs, gait normal Psychiatric: normal affect, behavior normal, pleasant   Medicare Attestation I have personally reviewed: The patient's medical and social history Their use of alcohol, tobacco or illicit drugs Their current medications and supplements The patient's functional ability including ADLs,fall risks, home safety risks, cognitive, and hearing and visual impairment Diet and physical activities Evidence for depression or mood disorders  The patient's weight, height, BMI, and visual acuity have been recorded in the chart.  I have made referrals, counseling, and provided education to the  patient based on review of the above and I have provided the patient with a written personalized care plan for preventive services.     Izora Ribas, NP   07/24/2018

## 2018-07-24 NOTE — Patient Instructions (Addendum)
Double up omeprazole to 40 mg in the morning, can pick up famotidine (pepcid) 20-40 mg in the evening if needed.   Bloating - simethicone (gas-X)   [name] , Thank you for taking time to come for your Medicare Wellness Visit. I appreciate your ongoing commitment to your health goals. Please review the following plan we discussed and let me know if I can assist you in the future.   These are the goals we discussed: Goals    . LDL CALC < 70    . Quit Smoking    . Weight (lb) < 150 lb (68 kg)       This is a list of the screening recommended for you and due dates:  Health Maintenance  Topic Date Due  . Mammogram  05/25/2018  . Tetanus Vaccine  02/06/2021  . Colon Cancer Screening  02/09/2024  . Flu Shot  Completed  . DEXA scan (bone density measurement)  Completed  .  Hepatitis C: One time screening is recommended by Center for Disease Control  (CDC) for  adults born from [birth year] through 1965.   Completed  . Pneumonia vaccines  Completed    Know what a healthy weight is for you (roughly BMI <25) and aim to maintain this  Aim for 7+ servings of fruits and vegetables daily  65-80+ fluid ounces of water or unsweet tea for healthy kidneys  Limit to max 1 drink of alcohol per day; avoid smoking/tobacco  Limit animal fats in diet for cholesterol and heart health - choose grass fed whenever available  Avoid highly processed foods, and foods high in saturated/trans fats  Aim for low stress - take time to unwind and care for your mental health  Aim for 150 min of moderate intensity exercise weekly for heart health, and weights twice weekly for bone health  Aim for 7-9 hours of sleep daily      Steps to Quit Smoking  Smoking tobacco can be harmful to your health and can affect almost every organ in your body. Smoking puts you, and those around you, at risk for developing many serious chronic diseases. Quitting smoking is difficult, but it is one of the best things that you can  do for your health. It is never too late to quit. What are the benefits of quitting smoking? When you quit smoking, you lower your risk of developing serious diseases and conditions, such as:  Lung cancer or lung disease, such as COPD.  Heart disease.  Stroke.  Heart attack.  Infertility.  Osteoporosis and bone fractures. Additionally, symptoms such as coughing, wheezing, and shortness of breath may get better when you quit. You may also find that you get sick less often because your body is stronger at fighting off colds and infections. If you are pregnant, quitting smoking can help to reduce your chances of having a baby of low birth weight. How do I get ready to quit? When you decide to quit smoking, create a plan to make sure that you are successful. Before you quit:  Pick a date to quit. Set a date within the next two weeks to give you time to prepare.  Write down the reasons why you are quitting. Keep this list in places where you will see it often, such as on your bathroom mirror or in your car or wallet.  Identify the people, places, things, and activities that make you want to smoke (triggers) and avoid them. Make sure to take these actions: ?  Throw away all cigarettes at home, at work, and in your car. ? Throw away smoking accessories, such as Scientist, research (medical). ? Clean your car and make sure to empty the ashtray. ? Clean your home, including curtains and carpets.  Tell your family, friends, and coworkers that you are quitting. Support from your loved ones can make quitting easier.  Talk with your health care provider about your options for quitting smoking.  Find out what treatment options are covered by your health insurance. What strategies can I use to quit smoking? Talk with your healthcare provider about different strategies to quit smoking. Some strategies include:  Quitting smoking altogether instead of gradually lessening how much you smoke over a period of  time. Research shows that quitting "cold [place]" is more successful than gradually quitting.  Attending in-person counseling to help you build problem-solving skills. You are more likely to have success in quitting if you attend several counseling sessions. Even short sessions of 10 minutes can be effective.  Finding resources and support systems that can help you to quit smoking and remain smoke-free after you quit. These resources are most helpful when you use them often. They can include: ? Online chats with a Social worker. ? Telephone quitlines. ? Careers information officer. ? Support groups or group counseling. ? Text messaging programs. ? Mobile phone applications.  Taking medicines to help you quit smoking. (If you are pregnant or breastfeeding, talk with your health care provider first.) Some medicines contain nicotine and some do not. Both types of medicines help with cravings, but the medicines that include nicotine help to relieve withdrawal symptoms. Your health care provider may recommend: ? Nicotine patches, gum, or lozenges. ? Nicotine inhalers or sprays. ? Non-nicotine medicine that is taken by mouth. Talk with your health care provider about combining strategies, such as taking medicines while you are also receiving in-person counseling. Using these two strategies together makes you more likely to succeed in quitting than if you used either strategy on its own. If you are pregnant or breastfeeding, talk with your health care provider about finding counseling or other support strategies to quit smoking. Do not take medicine to help you quit smoking unless told to do so by your health care provider. What things can I do to make it easier to quit? Quitting smoking might feel overwhelming at first, but there is a lot that you can do to make it easier. Take these important actions:  Reach out to your family and friends and ask that they support and encourage you during this time. Call  telephone quitlines, reach out to support groups, or work with a counselor for support.  Ask people who smoke to avoid smoking around you.  Avoid places that trigger you to smoke, such as bars, parties, or smoke-break areas at work.  Spend time around people who do not smoke.  Lessen stress in your life, because stress can be a smoking trigger for some people. To lessen stress, try: ? Exercising regularly. ? Deep-breathing exercises. ? Yoga. ? Meditating. ? Performing a body scan. This involves closing your eyes, scanning your body from head to toe, and noticing which parts of your body are particularly tense. Purposefully relax the muscles in those areas.  Download or purchase mobile phone or tablet apps (applications) that can help you stick to your quit plan by providing reminders, tips, and encouragement. There are many free apps, such as QuitGuide from the [employer] Office manager for Disease Control and Prevention). You can  find other support for quitting smoking (smoking cessation) through smokefree.gov and other websites. How will I feel when I quit smoking? Within the first 24 hours of quitting smoking, you may start to feel some withdrawal symptoms. These symptoms are usually most noticeable 2-3 days after quitting, but they usually do not last beyond 2-3 weeks. Changes or symptoms that you might experience include:  Mood swings.  Restlessness, anxiety, or irritation.  Difficulty concentrating.  Dizziness.  Strong cravings for sugary foods in addition to nicotine.  Mild weight gain.  Constipation.  Nausea.  Coughing or a sore throat.  Changes in how your medicines work in your body.  A depressed mood.  Difficulty sleeping (insomnia). After the first 2-3 weeks of quitting, you may start to notice more positive results, such as:  Improved sense of smell and taste.  Decreased coughing and sore throat.  Slower heart rate.  Lower blood pressure.  Clearer skin.  The  ability to breathe more easily.  Fewer sick days. Quitting smoking is very challenging for most people. Do not get discouraged if you are not successful the first time. Some people need to make many attempts to quit before they achieve long-term success. Do your best to stick to your quit plan, and talk with your health care provider if you have any questions or concerns. This information is not intended to replace advice given to you by your health care provider. Make sure you discuss any questions you have with your health care provider. Document Released: 06/26/2001 Document Revised: 02/05/2017 Document Reviewed: 11/16/2014 Elsevier Interactive Patient Education  2019 [employer].

## 2018-07-25 LAB — COMPLETE METABOLIC PANEL WITH GFR
AG RATIO: 1.9 (calc) (ref 1.0–2.5)
ALT: 8 U/L (ref 6–29)
AST: 16 U/L (ref 10–35)
Albumin: 4.4 g/dL (ref 3.6–5.1)
Alkaline phosphatase (APISO): 63 U/L (ref 33–130)
BILIRUBIN TOTAL: 0.5 mg/dL (ref 0.2–1.2)
BUN: 13 mg/dL (ref 7–25)
CHLORIDE: 103 mmol/L (ref 98–110)
CO2: 30 mmol/L (ref 20–32)
Calcium: 9.8 mg/dL (ref 8.6–10.4)
Creat: 0.71 mg/dL (ref 0.50–0.99)
GFR, EST NON AFRICAN AMERICAN: 88 mL/min/{1.73_m2} (ref >=60)
GFR, Est African American: 102 mL/min/{1.73_m2} (ref >=60)
GLUCOSE: 81 mg/dL (ref 65–99)
Globulin: 2.3 g/dL (calc) (ref 1.9–3.7)
Potassium: 4.5 mmol/L (ref 3.5–5.3)
Sodium: 140 mmol/L (ref 135–146)
TOTAL PROTEIN: 6.7 g/dL (ref 6.1–8.1)

## 2018-07-25 LAB — CBC WITH DIFFERENTIAL/PLATELET
Absolute Monocytes: 490 cells/uL (ref 200–950)
Basophils Absolute: 19 cells/uL (ref 0–200)
Basophils Relative: 0.3 %
EOS PCT: 2.1 %
Eosinophils Absolute: 130 cells/uL (ref 15–500)
HEMATOCRIT: 39.5 % (ref 35.0–45.0)
Hemoglobin: 13.4 g/dL (ref 11.7–15.5)
LYMPHS ABS: 2629 {cells}/uL (ref 850–3900)
MCH: 27.8 pg (ref 27.0–33.0)
MCHC: 33.9 g/dL (ref 32.0–36.0)
MCV: 82 fL (ref 80.0–100.0)
MPV: 10.7 fL (ref 7.5–12.5)
Monocytes Relative: 7.9 %
NEUTROS PCT: 47.3 %
Neutro Abs: 2933 cells/uL (ref 1500–7800)
PLATELETS: 248 10*3/uL (ref 140–400)
RBC: 4.82 10*6/uL (ref 3.80–5.10)
RDW: 13.3 % (ref 11.0–15.0)
TOTAL LYMPHOCYTE: 42.4 %
WBC: 6.2 10*3/uL (ref 3.8–10.8)

## 2018-07-25 LAB — LIPID PANEL
Cholesterol: 230 mg/dL — ABNORMAL HIGH (ref <200)
HDL: 85 mg/dL (ref >50)
LDL Cholesterol (Calc): 122 mg/dL (calc) — ABNORMAL HIGH
Non-HDL Cholesterol (Calc): 145 mg/dL (calc) — ABNORMAL HIGH (ref <130)
Total CHOL/HDL Ratio: 2.7 (calc) (ref <5.0)
Triglycerides: 122 mg/dL (ref <150)

## 2018-07-25 LAB — TSH: TSH: 0.82 m[IU]/L (ref 0.40–4.50)

## 2018-07-25 LAB — MAGNESIUM: Magnesium: 1.9 mg/dL (ref 1.5–2.5)

## 2018-08-04 ENCOUNTER — Other Ambulatory Visit: Payer: Self-pay | Admitting: Adult Health

## 2018-08-13 DIAGNOSIS — G4733 Obstructive sleep apnea (adult) (pediatric): Secondary | ICD-10-CM | POA: Diagnosis not present

## 2018-08-28 ENCOUNTER — Other Ambulatory Visit: Payer: Self-pay | Admitting: Internal Medicine

## 2018-08-29 ENCOUNTER — Other Ambulatory Visit: Payer: Self-pay | Admitting: Internal Medicine

## 2018-08-29 ENCOUNTER — Telehealth: Payer: Self-pay | Admitting: Adult Health

## 2018-08-29 DIAGNOSIS — R1013 Epigastric pain: Secondary | ICD-10-CM

## 2018-08-29 DIAGNOSIS — R079 Chest pain, unspecified: Secondary | ICD-10-CM

## 2018-08-29 MED ORDER — OMEPRAZOLE 40 MG PO CPDR: DELAYED_RELEASE_CAPSULE | ORAL | 3 refills | Status: DC

## 2018-08-29 NOTE — Telephone Encounter (Signed)
Patient called - please send new rx for (g)prillosec 10mg  take 2 once daily. Pharm- [place]. New instructions per Liane Comber

## 2018-09-02 ENCOUNTER — Telehealth: Payer: Self-pay

## 2018-09-02 NOTE — Telephone Encounter (Signed)
Called to let the pt know that the praluent pa was approved via [employer] insurance and will fax the pass paperwork in

## 2018-09-09 ENCOUNTER — Telehealth: Payer: Self-pay

## 2018-09-09 NOTE — Telephone Encounter (Signed)
Called pt to et them know that pass is missing some info from them and they want the pt to call them directly pt was compliant

## 2018-09-13 DIAGNOSIS — G4733 Obstructive sleep apnea (adult) (pediatric): Secondary | ICD-10-CM | POA: Diagnosis not present

## 2018-09-30 ENCOUNTER — Other Ambulatory Visit: Payer: Self-pay | Admitting: Adult Health

## 2018-10-01 ENCOUNTER — Telehealth: Payer: Self-pay

## 2018-10-01 NOTE — Telephone Encounter (Signed)
Called the pt and left a msg stating that the pass program needs a call back regarding income information. Also instructed the pt to call the [employer] and see which one would prove to be more helpful and to call us back at the office and let us know

## 2018-10-12 DIAGNOSIS — G4733 Obstructive sleep apnea (adult) (pediatric): Secondary | ICD-10-CM | POA: Diagnosis not present

## 2018-11-03 ENCOUNTER — Other Ambulatory Visit: Payer: Self-pay | Admitting: Internal Medicine

## 2018-11-12 DIAGNOSIS — R3915 Urgency of urination: Secondary | ICD-10-CM | POA: Diagnosis not present

## 2018-11-12 DIAGNOSIS — Z6827 Body mass index (BMI) 27.0-27.9, adult: Secondary | ICD-10-CM | POA: Diagnosis not present

## 2018-11-12 DIAGNOSIS — Z01419 Encounter for gynecological examination (general) (routine) without abnormal findings: Secondary | ICD-10-CM | POA: Diagnosis not present

## 2018-11-12 DIAGNOSIS — G4733 Obstructive sleep apnea (adult) (pediatric): Secondary | ICD-10-CM | POA: Diagnosis not present

## 2018-11-12 DIAGNOSIS — Z1231 Encounter for screening mammogram for malignant neoplasm of breast: Secondary | ICD-10-CM | POA: Diagnosis not present

## 2018-11-13 LAB — HM MAMMOGRAPHY

## 2018-11-14 ENCOUNTER — Other Ambulatory Visit: Payer: Self-pay | Admitting: Pharmacist

## 2018-11-14 MED ORDER — ALIROCUMAB 75 MG/ML ~~LOC~~ SOAJ: 75.0000 mg | SUBCUTANEOUS | 11 refills | Status: DC

## 2018-12-08 ENCOUNTER — Other Ambulatory Visit: Payer: Self-pay | Admitting: Adult Health

## 2018-12-08 DIAGNOSIS — F5101 Primary insomnia: Secondary | ICD-10-CM

## 2018-12-12 DIAGNOSIS — G4733 Obstructive sleep apnea (adult) (pediatric): Secondary | ICD-10-CM | POA: Diagnosis not present

## 2018-12-17 ENCOUNTER — Ambulatory Visit: Payer: PPO | Admitting: Pulmonary Disease

## 2018-12-24 ENCOUNTER — Other Ambulatory Visit: Payer: Self-pay | Admitting: Pharmacist Clinician (PhC)/ Clinical Pharmacy Specialist

## 2018-12-24 MED ORDER — ALIROCUMAB 75 MG/ML ~~LOC~~ SOAJ: 75.0000 mg | SUBCUTANEOUS | 11 refills | Status: DC

## 2018-12-26 IMAGING — CR DG CHEST 2V
2 series · 2 of 2 positions shown · non-contrast
Comparison: 01/11/2016

CLINICAL DATA: Cough, productive cough for months, seems chronic
but worsened in past few months, LEFT lower chest pain new, smoker,
hypertension

EXAM:
CHEST - 2 VIEW

[chest pa]
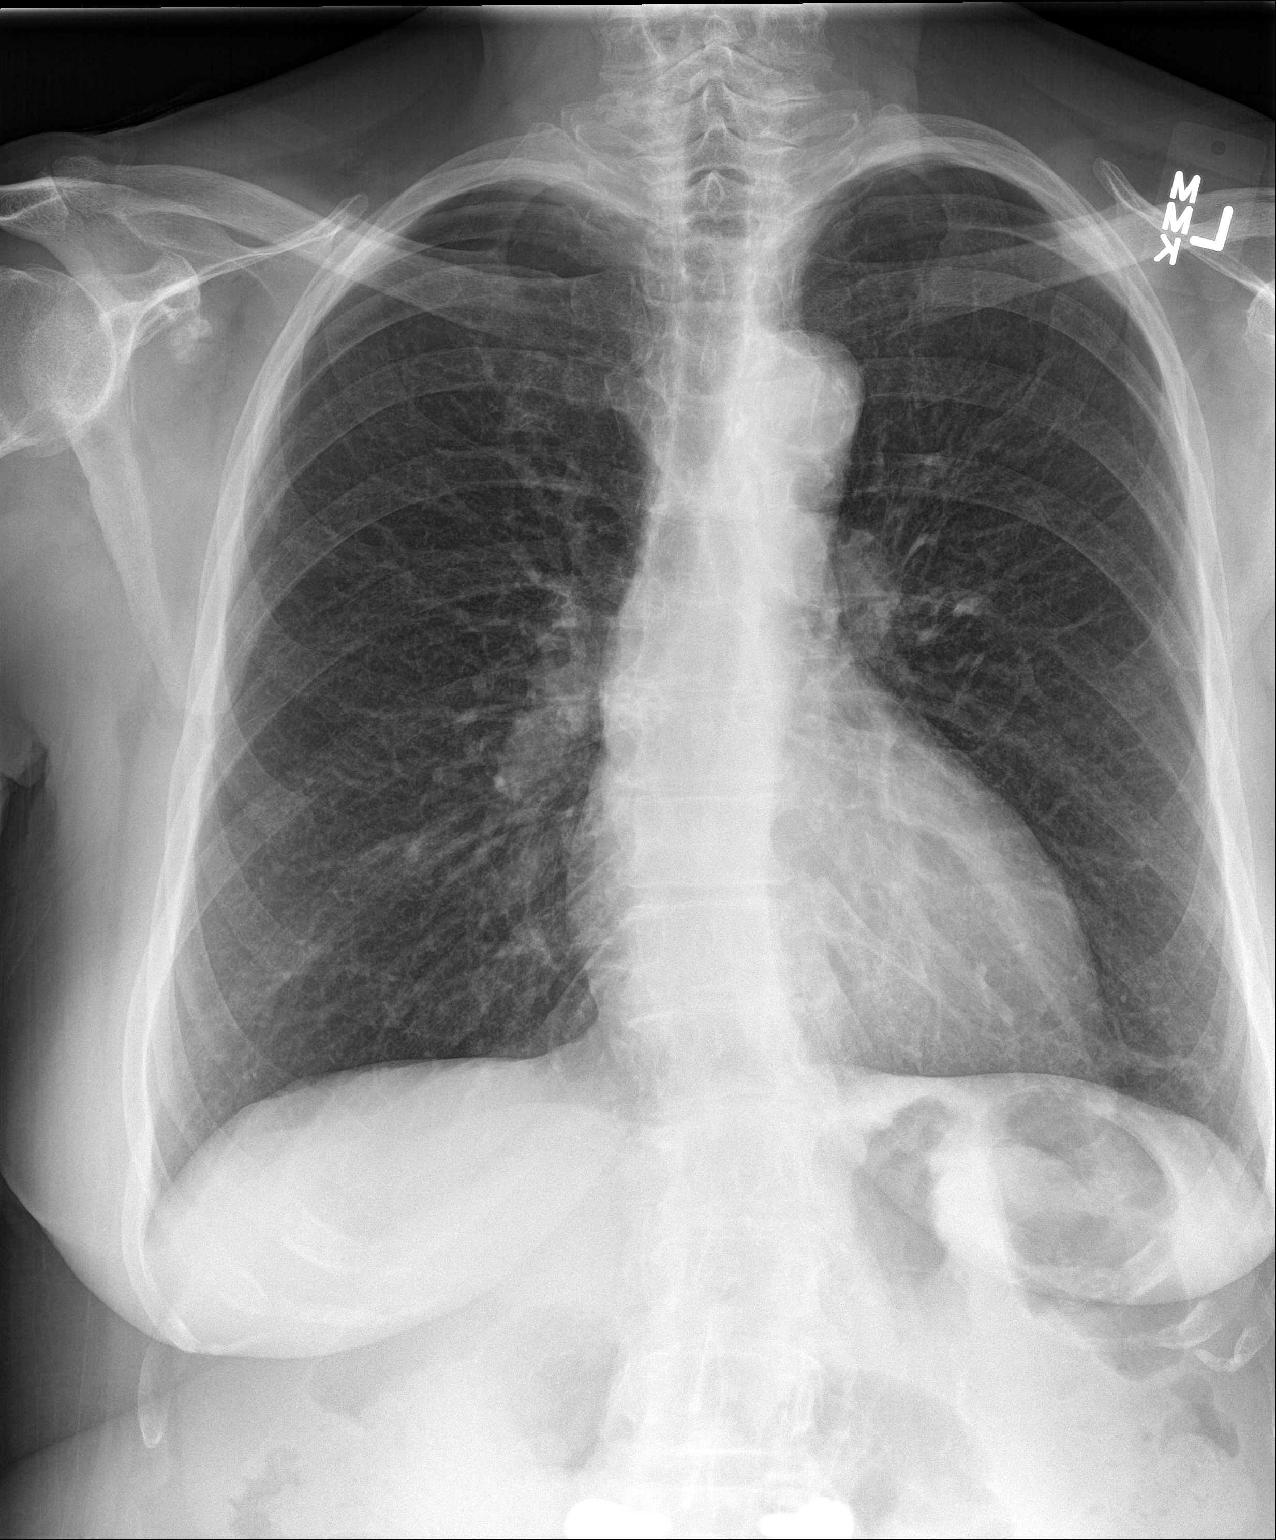

[chest lat]
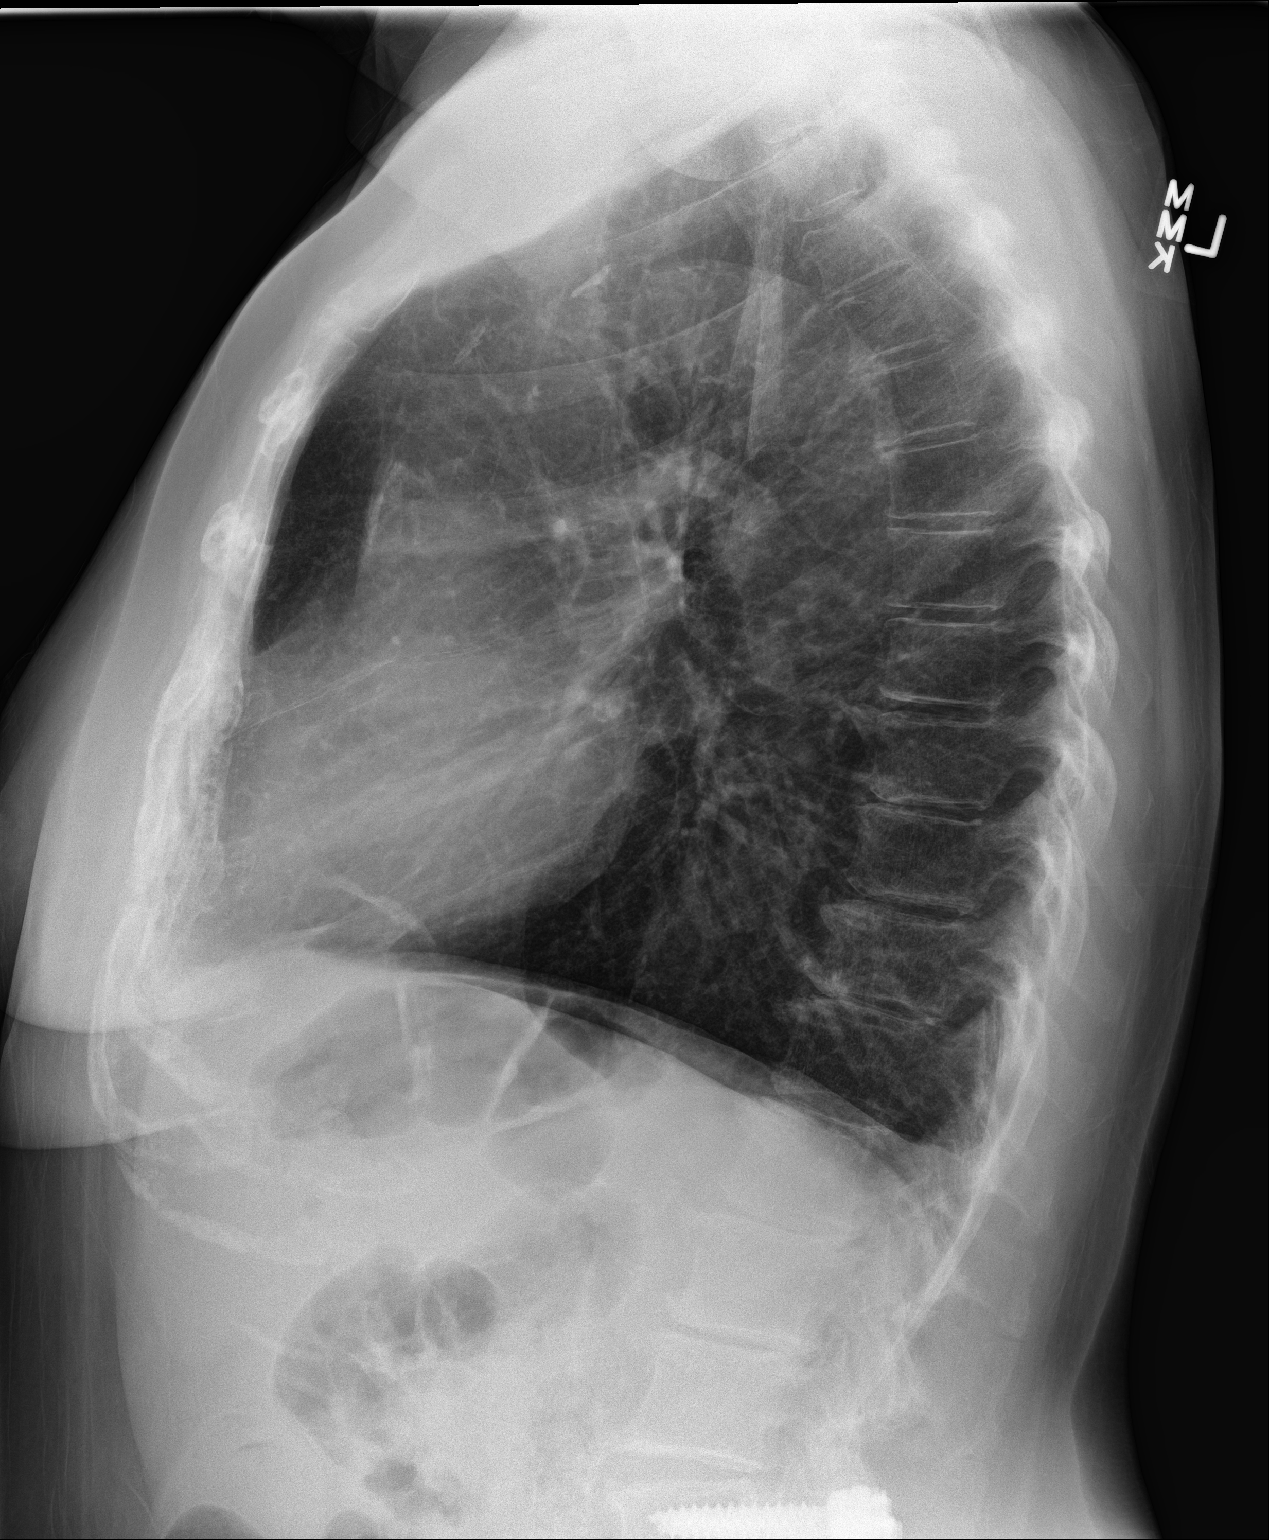

[2 of 2 positions shown; findings below may reference images not displayed]

FINDINGS: Normal heart size, mediastinal contours, and pulmonary vascularity.

Atherosclerotic calcification aorta.

Lungs emphysematous with minimal subsegmental atelectasis at base of
lingula.

No acute infiltrate, pleural effusion or pneumothorax.

Biconvex thoracolumbar scoliosis.

Mild degenerative disc disease changes thoracic spine.

Prior lumbar fusion.

Small calcified bodies at inferior to RIGHT coracoid process.
IMPRESSION: COPD changes with minimal LEFT basilar atelectasis.

Scoliosis.

## 2018-12-29 ENCOUNTER — Other Ambulatory Visit: Payer: Self-pay | Admitting: Internal Medicine

## 2018-12-29 MED ORDER — LOSARTAN POTASSIUM 100 MG PO TABS: ORAL_TABLET | ORAL | 0 refills | Status: DC

## 2018-12-30 ENCOUNTER — Telehealth: Payer: Self-pay

## 2018-12-30 ENCOUNTER — Other Ambulatory Visit: Payer: Self-pay

## 2018-12-30 MED ORDER — PRALUENT 75 MG/ML ~~LOC~~ SOAJ: 75.0000 mg | SUBCUTANEOUS | 11 refills | Status: DC

## 2018-12-30 NOTE — Telephone Encounter (Signed)
Refill

## 2018-12-30 NOTE — Addendum Note (Signed)
Addended by: Rockne Menghini on: 12/30/2018 09:24 AM   Modules accepted: Orders

## 2019-01-02 ENCOUNTER — Other Ambulatory Visit: Payer: Self-pay | Admitting: Adult Health

## 2019-01-02 DIAGNOSIS — I10 Essential (primary) hypertension: Secondary | ICD-10-CM

## 2019-01-12 DIAGNOSIS — G4733 Obstructive sleep apnea (adult) (pediatric): Secondary | ICD-10-CM | POA: Diagnosis not present

## 2019-01-29 ENCOUNTER — Encounter: Payer: Self-pay | Admitting: Adult Health

## 2019-01-29 ENCOUNTER — Ambulatory Visit (INDEPENDENT_AMBULATORY_CARE_PROVIDER_SITE_OTHER): Payer: PPO | Admitting: Adult Health

## 2019-01-29 ENCOUNTER — Other Ambulatory Visit: Payer: Self-pay

## 2019-01-29 VITALS — BP 124/72 | HR 74 | Temp 98.1°F | Ht 66.0 in | Wt 161.0 lb

## 2019-01-29 DIAGNOSIS — R358 Other polyuria: Secondary | ICD-10-CM | POA: Diagnosis not present

## 2019-01-29 DIAGNOSIS — H539 Unspecified visual disturbance: Secondary | ICD-10-CM | POA: Diagnosis not present

## 2019-01-29 DIAGNOSIS — R631 Polydipsia: Secondary | ICD-10-CM

## 2019-01-29 DIAGNOSIS — R350 Frequency of micturition: Secondary | ICD-10-CM

## 2019-01-29 DIAGNOSIS — R3589 Other polyuria: Secondary | ICD-10-CM

## 2019-01-29 NOTE — Progress Notes (Signed)
Assessment and Plan:  [name] was seen today for eye problem.  Diagnoses and all orders for this visit:  Frequency of urination and polyuria/Polydipsia Reported per patient, though per HPI whether this is a significant change is debatable  POC glucose is normal - 95 Seeing established ophth tomorrow for vision check and exam Normal neuro exam, no red flags in history  Will check UA, osmolality, renal functions and electrolytes   R/o UTI If any abnormalities can pursue further workup- 24 hour urine studies My suspicion for primary polydipsia, central etiology is low at this time; may reevaluate pending initial labs Patient is reassured by this assessment today; will follow up if any changes  -     COMPLETE METABOLIC PANEL WITH GFR -     Urinalysis w microscopic + reflex cultur -     Osmolality, urine  Vision abnormalities No red flags; pending evaluation by established ophth tomorrow; she is due for annual check   Further disposition pending results of labs. Discussed med's effects and SE's.   Over 20 minutes of exam, counseling, chart review, and critical decision making was performed.   Future Appointments  Date Time Provider [place]  02/04/2019  9:00 AM Unk Pinto, MD [facility] None  07/29/2019  3:45 PM Liane Comber, NP [facility] None    ------------------------------------------------------------------------------------------------------------------   HPI BP 124/72   Pulse 74   Temp 98.1 F (36.7 C)   Ht 5\' 6"  (1.676 m)   Wt 161 lb (73 kg)   SpO2 94%   BMI 25.99 kg/m   68 y.o.female smoker, with hx of htn, former prediabetic without complication (last non-normal A1C was 5.9% in 09/2015) presents for evaluation due to vision changes, perceived polydipsia/polyuria and concern about possible glucose abnormality. Her mother was a T2 diabetic.   She reports excessive thirst, excessive urination in the last 2-3 weeks; she reports she has urge to go  frequently. She is getting up 2-3 times per night to urinate. Urine is very clear. She reports feels she needs to urinate as soon as she drinks. Denies foul odor, urine cloudiness, dysuria, urgency.   She reports has always had a dry mouth, has always a lot, but mouth has been drier than usual and increased fluid intake - but states has always drank 80-90 fluid ounces + 3-4 beers in the evening, water is up to ~100 fluid ounces. No new medications other than praluent via cardiology started 3 months ago. She does take an antihistamine for allergies.    She endorses new onset headaches in the last 2-3 weeks; describes as day time headaches, typically after she tries to read on computer. She describes as frontal, gradual onset, brief, 5 min or so, mild 2/10, resolves spontaneously without interventions. Denies dizziness, nausea/vomiting.   She also reports vision change, seeing a "light" spot in left eye vision when she is in a dark room; has appointment with established ophthalmology tomorrow. She is s/p cataract in this eye. Uses reading glasses, last checked ~1 year ago.   Spot check glucose today: 95   She has been working on diet and exercise for hx of prediabetes, and denies increased appetite, nausea, paresthesia of the feet, vomiting and weight loss. Last A1C in the office was:  Lab Results  Component Value Date   HGBA1C 5.3 01/08/2018   BMI is Body mass index is 25.99 kg/m. Wt Readings from Last 3 Encounters:  01/29/19 161 lb (73 kg)  07/24/18 164 lb 9.6 oz (74.7 kg)  06/16/18 162 lb (73.5 kg)      Past Medical History:  Diagnosis Date  . Allergy    enviromental  . Anxiety   . Arthritis    osteoarthritis  . COPD (chronic obstructive pulmonary disease) (HCC)    mild  . Diabetes mellitus without complication ([place])    controlled by diet  . Environmental allergies   . Hepatitis    hepatitis A  . Hyperlipidemia   . Hypertension   . Mixed hyperlipidemia 06/15/2013  . PONV  (postoperative nausea and vomiting)    second surgery no problems last surgery  . Urgency of urination    Takes oxybutynin; under control at this time     Allergies  Allergen Reactions  . Enalapril Swelling    Caused lips to swell and bloating.  Marland Kitchen No Known Allergies   . Repatha [Evolocumab]     Current Outpatient Medications on File Prior to Visit  Medication Sig  . Alirocumab (PRALUENT) 75 MG/ML SOAJ Inject 75 mg into the skin every 14 (fourteen) days.  Marland Kitchen amLODipine (NORVASC) 2.5 MG tablet TAKE 1 TABLET BY MOUTH TWICE DAILY  . aspirin 81 MG tablet Take 81 mg by mouth every other day.   . bisoprolol-hydrochlorothiazide (ZIAC) 10-6.25 MG tablet Take 0.5 tablets by mouth daily.  Marland Kitchen buPROPion (WELLBUTRIN XL) 150 MG 24 hr tablet TAKE 1 TABLET(150 MG) BY MOUTH EVERY MORNING  . Cholecalciferol (VITAMIN D3) 5000 UNITS TABS Take 5,000 Units by mouth daily.  . clonazePAM (KLONOPIN) 0.5 MG tablet TAKE 1/2 TO 1 TABLET BY MOUTH AT BEDTIME FOR SLEEP  . Cyanocobalamin (VITAMIN B12 PO) Take 1,000 mcg by mouth daily.  Marland Kitchen dextromethorphan-guaiFENesin (MUCINEX DM) 30-600 MG 12hr tablet Take 1 tablet by mouth as needed.   . fluticasone (FLONASE) 50 MCG/ACT nasal spray 1 Spray each nostril daily (Patient taking differently: as needed. 1 Spray each nostril daily)  . losartan (COZAAR) 100 MG tablet Take 1 tablet Daily for BP  . meloxicam (MOBIC) 15 MG tablet TAKE 1 TABLET BY MOUTH DAILY  . methylcellulose oral powder Take by mouth daily.   . montelukast (SINGULAIR) 10 MG tablet TAKE 1 TABLET(10 MG) BY MOUTH DAILY  . Multiple Vitamins-Minerals (MULTI COMPLETE PO) Take 1 capsule by mouth daily.  Marland Kitchen NIFEdipine (PROCARDIA XL/NIFEDICAL XL) 60 MG 24 hr tablet TAKE 1 TABLET BY MOUTH EVERY NIGHT FOR BLOOD PRESSURE  . Omega-3 Fatty Acids (FISH OIL) 1000 MG CAPS Take 1 capsule by mouth daily.  Marland Kitchen omeprazole (PRILOSEC) 40 MG capsule Take 1 capsule daily for Indigestion & Heartburn  . Tiotropium Bromide Monohydrate  (SPIRIVA RESPIMAT) 1.25 MCG/ACT AERS Inhale 2 Inhalers into the lungs daily.   No current facility-administered medications on file prior to visit.     ROS: all negative except above.   Physical Exam:  BP 124/72   Pulse 74   Temp 98.1 F (36.7 C)   Ht 5\' 6"  (1.676 m)   Wt 161 lb (73 kg)   SpO2 94%   BMI 25.99 kg/m   General Appearance: Well nourished, in no apparent distress. Eyes: PERRLA, EOMs, Field of vision is normal, conjunctiva no swelling or erythema Sinuses: No Frontal/maxillary tenderness ENT/Mouth: Ext aud canals clear, TMs without erythema, bulging. No erythema, swelling, or exudate on post pharynx.  Tonsils not swollen or erythematous. Hearing normal.  Neck: Supple, thyroid normal.  Respiratory: Respiratory effort normal, BS equal bilaterally without rales, rhonchi, wheezing or stridor.  Cardio: RRR with no MRGs. Brisk peripheral pulses without edema.  Abdomen: Soft, + BS.  Non tender, no guarding, rebound, hernias, masses. Lymphatics: Non tender without lymphadenopathy.  Musculoskeletal: Full ROM, 5/5 strength, normal gait.  Skin: Warm, dry without rashes, lesions, ecchymosis.  Neuro: Cranial nerves intact. Normal muscle tone, no cerebellar symptoms.  Psych: Awake and oriented X 3, normal affect, Insight and Judgment appropriate.     Izora Ribas, NP 11:51 AM [facility]

## 2019-01-29 NOTE — Patient Instructions (Signed)
Urinary Frequency, Adult Urinary frequency means urinating more often than usual. You may urinate every 1-2 hours even though you drink a normal amount of fluid and do not have a bladder infection or condition. Although you urinate more often than normal, the total amount of urine produced in a day is normal. With urinary frequency, you may have an urgent need to urinate often. The stress and anxiety of needing to find a bathroom quickly can make this urge worse. This condition may go away on its own or you may need treatment at home. Home treatment may include bladder training, exercises, taking medicines, or making changes to your diet. Follow these instructions at home: Bladder health   Keep a bladder diary if told by your health care provider. Keep track of: ? What you eat and drink. ? How often you urinate. ? How much you urinate.  Follow a bladder training program if told by your health care provider. This may include: ? Learning to delay going to the bathroom. ? Double urinating (voiding). This helps if you are not completely emptying your bladder. ? Scheduled voiding.  Do Kegel exercises as told by your health care provider. Kegel exercises strengthen the muscles that help control urination, which may help the condition. Eating and drinking  If told by your health care provider, make diet changes, such as: ? Avoiding caffeine. ? Drinking fewer fluids, especially alcohol. ? Not drinking in the evening. ? Avoiding foods or drinks that may irritate the bladder. These include coffee, tea, soda, artificial sweeteners, citrus, tomato-based foods, and chocolate. ? Eating foods that help prevent or ease constipation. Constipation can make this condition worse. Your health care provider may recommend that you:  Drink enough fluid to keep your urine pale yellow.  Take over-the-counter or prescription medicines.  Eat foods that are high in fiber, such as beans, whole grains, and fresh  fruits and vegetables.  Limit foods that are high in fat and processed sugars, such as fried or sweet foods. General instructions  Take over-the-counter and prescription medicines only as told by your health care provider.  Keep all follow-up visits as told by your health care provider. This is important. Contact a health care provider if:  You start urinating more often.  You feel pain or irritation when you urinate.  You notice blood in your urine.  Your urine looks cloudy.  You develop a fever.  You begin vomiting. Get help right away if:  You are unable to urinate. Summary  Urinary frequency means urinating more often than usual. With urinary frequency, you may urinate every 1-2 hours even though you drink a normal amount of fluid and do not have a bladder infection or other bladder condition.  Your health care provider may recommend that you keep a bladder diary, follow a bladder training program, or make dietary changes.  If told by your health care provider, do Kegel exercises to strengthen the muscles that help control urination.  Take over-the-counter and prescription medicines only as told by your health care provider.  Contact a health care provider if your symptoms do not improve or get worse. This information is not intended to replace advice given to you by your health care provider. Make sure you discuss any questions you have with your health care provider. Document Released: 04/28/2009 Document Revised: 01/09/2018 Document Reviewed: 01/09/2018 Elsevier Patient Education  2020 [employer].

## 2019-01-30 DIAGNOSIS — H524 Presbyopia: Secondary | ICD-10-CM | POA: Diagnosis not present

## 2019-01-31 LAB — URINALYSIS W MICROSCOPIC + REFLEX CULTURE
Bacteria, UA: NONE SEEN /HPF
Bilirubin Urine: NEGATIVE
Glucose, UA: NEGATIVE
Hgb urine dipstick: NEGATIVE
Hyaline Cast: NONE SEEN /LPF
Ketones, ur: NEGATIVE
Leukocyte Esterase: NEGATIVE
Nitrites, Initial: NEGATIVE
Protein, ur: NEGATIVE
RBC / HPF: NONE SEEN /HPF (ref 0–2)
Specific Gravity, Urine: 1.005 (ref 1.001–1.03)
WBC, UA: NONE SEEN /HPF (ref 0–5)
pH: 7 (ref 5.0–8.0)

## 2019-01-31 LAB — OSMOLALITY, URINE: Osmolality, Ur: 107 mOsm/kg (ref 50–1200)

## 2019-01-31 LAB — COMPLETE METABOLIC PANEL WITH GFR
AG Ratio: 1.8 (calc) (ref 1.0–2.5)
ALT: 9 U/L (ref 6–29)
AST: 16 U/L (ref 10–35)
Albumin: 4.4 g/dL (ref 3.6–5.1)
Alkaline phosphatase (APISO): 64 U/L (ref 37–153)
BUN: 15 mg/dL (ref 7–25)
CO2: 28 mmol/L (ref 20–32)
Calcium: 9.8 mg/dL (ref 8.6–10.4)
Chloride: 102 mmol/L (ref 98–110)
Creat: 0.7 mg/dL (ref 0.50–0.99)
GFR, Est African American: 103 mL/min/{1.73_m2} (ref >=60)
GFR, Est Non African American: 89 mL/min/{1.73_m2} (ref >=60)
Globulin: 2.5 g/dL (calc) (ref 1.9–3.7)
Glucose, Bld: 93 mg/dL (ref 65–99)
Potassium: 4.2 mmol/L (ref 3.5–5.3)
Sodium: 138 mmol/L (ref 135–146)
Total Bilirubin: 0.6 mg/dL (ref 0.2–1.2)
Total Protein: 6.9 g/dL (ref 6.1–8.1)

## 2019-01-31 LAB — NO CULTURE INDICATED

## 2019-02-03 ENCOUNTER — Encounter: Payer: Self-pay | Admitting: Internal Medicine

## 2019-02-03 NOTE — Progress Notes (Signed)
[facility] Unk Pinto, M.D.     Uvaldo Bristle. Silverio Lay, P.A.-C Liane Comber, [facility] [address] [place], [place] [address] Telephone [phone] Telefax [phone] Annual Screening/Preventative Visit & Comprehensive Evaluation &  Examination     This very nice 68 y.o. married American [place] presents for a Screening / Preventative Visit & comprehensive evaluation and management of multiple medical co-morbidities.  Patient has been followed for HTN, HLD, Prediabetes  and Vitamin D Deficiency. Patient has GERD controlled on current meds. She has also been followed bty Dr Lamonte Sakai & Wyn Quaker. NP re: her COPD, OSA/CPAP and for Lung Cancer Screening.      HTN predates since 89. In Sept 2018, she had an equivocal ETT & a f/u 2D Stress cardiac Echo was reported "No Ischemia".   Patient's BP has been controlled at home and patient denies any cardiac symptoms as chest pain, palpitations, shortness of breath, dizziness or ankle swelling. Today's BPis at goal - 128/58.     Patient has hx/o Statin intolerance and hyperlipidemia is improved, but not controlled with diet and PCSK9i thru the [facility]. Last lipids were not at goal: Lab Results  Component Value Date   CHOL 230 (H) 07/24/2018   HDL 85 07/24/2018   LDLCALC 122 (H) 07/24/2018   TRIG 122 07/24/2018   CHOLHDL 2.7 07/24/2018      Patient has hx/o  Prediabetes (A1c 5.7% / 2017)  and patient denies reactive hypoglycemic symptoms, visual blurring, diabetic polys or paresthesias. Last A1c was Normal & at goal: Lab Results  Component Value Date   HGBA1C 5.3 01/08/2018      Finally, patient has history of Vitamin D Deficiency and last Vitamin D was at goal: Lab Results  Component Value Date   VD25OH 84 01/08/2018   Current Outpatient Medications on File Prior to Visit  Medication Sig  . Alirocumab (PRALUENT) 75 MG/ML SOAJ  Inject 75 mg into the skin every 14 (fourteen) days.  Marland Kitchen amLODipine (NORVASC) 2.5 MG tablet TAKE 1 TABLET BY MOUTH TWICE DAILY  . aspirin 81 MG tablet Take 81 mg by mouth every other day.   . bisoprolol-hydrochlorothiazide (ZIAC) 10-6.25 MG tablet Take 0.5 tablets by mouth daily.  Marland Kitchen buPROPion (WELLBUTRIN XL) 150 MG 24 hr tablet TAKE 1 TABLET(150 MG) BY MOUTH EVERY MORNING  . Cholecalciferol (VITAMIN D3) 5000 UNITS TABS Take 5,000 Units by mouth daily.  . clonazePAM (KLONOPIN) 0.5 MG tablet TAKE 1/2 TO 1 TABLET BY MOUTH AT BEDTIME FOR SLEEP  . Cyanocobalamin (VITAMIN B12 PO) Take 1,000 mcg by mouth daily.  Marland Kitchen dextromethorphan-guaiFENesin (MUCINEX DM) 30-600 MG 12hr tablet Take 1 tablet by mouth as needed.   . fluticasone (FLONASE) 50 MCG/ACT nasal spray 1 Spray each nostril daily (Patient taking differently: as needed. 1 Spray each nostril daily)  . losartan (COZAAR) 100 MG tablet Take 1 tablet Daily for BP  . meloxicam (MOBIC) 15 MG tablet TAKE 1 TABLET BY MOUTH DAILY  . methylcellulose oral powder Take by mouth daily.   . montelukast (SINGULAIR) 10 MG tablet TAKE 1 TABLET(10 MG) BY MOUTH DAILY  . Multiple Vitamins-Minerals (MULTI COMPLETE PO) Take 1 capsule by mouth daily.  Marland Kitchen NIFEdipine (PROCARDIA XL/NIFEDICAL XL) 60 MG 24 hr tablet TAKE 1 TABLET BY MOUTH EVERY NIGHT FOR BLOOD PRESSURE  . Omega-3 Fatty Acids (FISH OIL) 1000 MG CAPS Take 1 capsule by mouth daily.  Marland Kitchen omeprazole (PRILOSEC) 40 MG capsule  Take 1 capsule daily for Indigestion & Heartburn  . Tiotropium Bromide Monohydrate (SPIRIVA RESPIMAT) 1.25 MCG/ACT AERS Inhale 2 Inhalers into the lungs daily.   No current facility-administered medications on file prior to visit.    Allergies  Allergen Reactions  . Enalapril Swelling    Caused lips to swell and bloating.  Marland Kitchen No Known Allergies   . Repatha [Evolocumab]    Past Medical History:  Diagnosis Date  . Allergy    enviromental  . Anxiety   . Arthritis    osteoarthritis  . COPD  (chronic obstructive pulmonary disease) (HCC)    mild  . Diabetes mellitus without complication ([place])    controlled by diet  . Environmental allergies   . Hepatitis    hepatitis A  . Hyperlipidemia   . Hypertension   . Mixed hyperlipidemia 06/15/2013  . PONV (postoperative nausea and vomiting)    second surgery no problems last surgery  . Urgency of urination    Takes oxybutynin; under control at this time   Health Maintenance  Topic Date Due  . MAMMOGRAM  05/25/2018  . INFLUENZA VACCINE  02/14/2019  . TETANUS/TDAP  02/06/2021  . COLONOSCOPY  02/09/2024  . DEXA SCAN  Completed  . Hepatitis C Screening  Completed  . PNA vac Low Risk Adult  Completed   Immunization History  Administered Date(s) Administered  . Influenza, High Dose Seasonal PF 04/10/2017  . Influenza-Unspecified 03/05/2018  . PPD Test 09/21/2014  . Pneumococcal Conjugate-13 10/22/2017  . Pneumococcal Polysaccharide-23 02/07/2011, 01/26/2016  . Tdap 02/07/2011   Last Colon - 02/08/2014 - Dr Deatra Ina - recc 10 yr f/u due July 2025  Last MGM - 05/25/2016  & patient aware overdue  Past Surgical History:  Procedure Laterality Date  . APPENDECTOMY     removed as child  . BACK SURGERY     lumbar fusion  . COLONOSCOPY    . DILATION AND CURETTAGE OF UTERUS    . LUMBAR FUSION  02/06/2012   L 4  L5  . PARTIAL HYSTERECTOMY    . TOTAL HIP ARTHROPLASTY  06/02/2012   Procedure: TOTAL HIP ARTHROPLASTY;  Surgeon: Kerin Salen, MD;  Location: [facility];  Service: Orthopedics;  Laterality: Right;  . TOTAL HIP ARTHROPLASTY Left 01/25/2016  . TOTAL HIP ARTHROPLASTY Left 01/25/2016   Procedure: LEFT TOTAL HIP ARTHROPLASTY;  Surgeon: Frederik Pear, MD;  Location: [facility];  Service: Orthopedics;  Laterality: Left;  . TOTAL KNEE ARTHROPLASTY Right 12/15/2012   Procedure: TOTAL KNEE ARTHROPLASTY;  Surgeon: Kerin Salen, MD;  Location: [facility];  Service: Orthopedics;  Laterality: Right;  . TOTAL KNEE ARTHROPLASTY Left 06/18/2016    Procedure: TOTAL KNEE ARTHROPLASTY;  Surgeon: Frederik Pear, MD;  Location: [facility];  Service: Orthopedics;  Laterality: Left;   Family History  Problem Relation Age of Onset  . Diabetes Mother   . Colon cancer Mother   . Hypertension Father   . CAD Father   . Colon cancer Sister   . Lung cancer Brother   . Hypertension Brother   . Hypertension Brother   . Esophageal cancer Neg Hx   . Rectal cancer Neg Hx   . Stomach cancer Neg Hx    Social History   Tobacco Use  . Smoking status: Current Every Day Smoker    Packs/day: 0.75    Years: 30.00    Pack years: 22.50    Types: Cigarettes    Start date: 53  . Smokeless tobacco: Never Used  .  Tobacco comment: 10 cigarettes/day 02/11/2018  Substance Use Topics  . Alcohol use: Yes    Alcohol/week: 14.0 standard drinks    Types: 14 Glasses of wine per week    Comment: either a glass of wine a night or 2 beers-advised none within 48 hrs of surgery  . Drug use: Yes    Types: Marijuana    Comment: last 06/12/16- advised none within 48 hrs of surgery    ROS Constitutional: Denies fever, chills, weight loss/gain, headaches, insomnia,  night sweats, and change in appetite. Does c/o fatigue. Eyes: Denies redness, blurred vision, diplopia, discharge, itchy, watery eyes.  ENT: Denies discharge, congestion, post nasal drip, epistaxis, sore throat, earache, hearing loss, dental pain, Tinnitus, Vertigo, Sinus pain, snoring.  Cardio: Denies chest pain, palpitations, irregular heartbeat, syncope, dyspnea, diaphoresis, orthopnea, PND, claudication, edema Respiratory: denies cough, dyspnea, DOE, pleurisy, hoarseness, laryngitis, wheezing.  Gastrointestinal: Denies dysphagia, heartburn, reflux, water brash, pain, cramps, nausea, vomiting, bloating, diarrhea, constipation, hematemesis, melena, hematochezia, jaundice, hemorrhoids Genitourinary: Denies dysuria, frequency, urgency, nocturia, hesitancy, discharge, hematuria, flank pain Breast: Breast lumps,  nipple discharge, bleeding.  Musculoskeletal: Denies arthralgia, myalgia, stiffness, Jt. Swelling, pain, limp, and strain/sprain. Denies falls. Skin: Denies puritis, rash, hives, warts, acne, eczema, changing in skin lesion Neuro: No weakness, tremor, incoordination, spasms, paresthesia, pain Psychiatric: Denies confusion, memory loss, sensory loss. Denies Depression. Endocrine: Denies change in weight, skin, hair change, nocturia, and paresthesia, diabetic polys, visual blurring, hyper / hypo glycemic episodes.  Heme/Lymph: No excessive bleeding, bruising, enlarged lymph nodes.  Physical Exam  BP (!) 128/58   Pulse 68   Temp (!) 97.1 F (36.2 C)   Resp 16   Ht 5\' 6"  (1.676 m)   Wt 162 lb 9.6 oz (73.8 kg)   BMI 26.24 kg/m   General Appearance: Well nourished, well groomed and in no apparent distress.  Eyes: PERRLA, EOMs, conjunctiva no swelling or erythema, normal fundi and vessels. Sinuses: No frontal/maxillary tenderness ENT/Mouth: EACs patent / TMs  nl. Nares clear without erythema, swelling, mucoid exudates. Oral hygiene is good. No erythema, swelling, or exudate. Tongue normal, non-obstructing. Tonsils not swollen or erythematous. Hearing normal.  Neck: Supple, thyroid not palpable. No bruits, nodes or JVD. Respiratory: Respiratory effort normal.  BS equal and clear bilateral without rales, rhonci, wheezing or stridor. Cardio: Heart sounds are normal with regular rate and rhythm and no murmurs, rubs or gallops. Peripheral pulses are normal and equal bilaterally without edema. No aortic or femoral bruits. Chest: symmetric with normal excursions and percussion. Breasts: Symmetric, without lumps, nipple discharge, retractions, or fibrocystic changes.  Abdomen: Flat, soft with bowel sounds active. Nontender, no guarding, rebound, hernias, masses, or organomegaly.  Lymphatics: Non tender without lymphadenopathy.  Musculoskeletal: Full ROM all peripheral extremities, joint stability,  5/5 strength, and normal gait. Skin: Warm and dry without rashes, lesions, cyanosis, clubbing or  ecchymosis.  Neuro: Cranial nerves intact, reflexes equal bilaterally. Normal muscle tone, no cerebellar symptoms. Sensation intact.  Pysch: Alert and oriented X 3, normal affect, Insight and Judgment appropriate.   Assessment and Plan  1. Annual Preventative Screening Examination  2. Essential hypertension  - EKG 12-Lead - Microalbumin / creatinine urine ratio - CBC with Differential/Platelet - Magnesium - TSH  3. Hyperlipidemia, mixed  - EKG 12-Lead - Lipid panel - TSH  4. Abnormal glucose  - EKG 12-Lead - Hemoglobin A1c - Insulin, random  5. Vitamin D deficiency  - VITAMIN D 25 Hydroxy   6. Prediabetes  - EKG 12-Lead -  Hemoglobin A1c - Insulin, random  7. Chronic obstructive pulmonary disease ([place])   8. Gastroesophageal reflux disease  - CBC with Differential/Platelet  9. Screening for colorectal cancer  - POC Hemoccult Bld/Stl  10. Screening for ischemic heart disease  - EKG 12-Lead  11. FHx: heart disease  - EKG 12-Lead  12. Smoker  - EKG 12-Lead  13. Aortic atherosclerosis by Abd CT scan (HCC)  - EKG 12-Lead  14. Medication management  - Microalbumin / creatinine urine ratio - CBC with Differential/Platelet - Magnesium - Lipid panel - TSH - Hemoglobin A1 - Insulin, random - VITAMIN D 25 Hydroxyl       Patient was counseled in prudent diet to achieve/maintain BMI less than 25 for weight control, BP monitoring, regular exercise and medications. Discussed med's effects and SE's. Screening labs and tests as requested with regular follow-up as recommended. Over 40 minutes of exam, counseling, chart review and high complex critical decision making was performed.   Kirtland Bouchard, MD

## 2019-02-03 NOTE — Patient Instructions (Signed)

## 2019-02-04 ENCOUNTER — Other Ambulatory Visit: Payer: Self-pay

## 2019-02-04 ENCOUNTER — Ambulatory Visit (INDEPENDENT_AMBULATORY_CARE_PROVIDER_SITE_OTHER): Payer: PPO | Admitting: Internal Medicine

## 2019-02-04 VITALS — BP 128/58 | HR 68 | Temp 97.1°F | Resp 16 | Ht 66.0 in | Wt 162.6 lb

## 2019-02-04 DIAGNOSIS — Z136 Encounter for screening for cardiovascular disorders: Secondary | ICD-10-CM | POA: Diagnosis not present

## 2019-02-04 DIAGNOSIS — E782 Mixed hyperlipidemia: Secondary | ICD-10-CM | POA: Diagnosis not present

## 2019-02-04 DIAGNOSIS — I10 Essential (primary) hypertension: Secondary | ICD-10-CM | POA: Diagnosis not present

## 2019-02-04 DIAGNOSIS — I700 Atherosclerosis of aorta: Secondary | ICD-10-CM

## 2019-02-04 DIAGNOSIS — Z0001 Encounter for general adult medical examination with abnormal findings: Secondary | ICD-10-CM

## 2019-02-04 DIAGNOSIS — K219 Gastro-esophageal reflux disease without esophagitis: Secondary | ICD-10-CM

## 2019-02-04 DIAGNOSIS — J449 Chronic obstructive pulmonary disease, unspecified: Secondary | ICD-10-CM

## 2019-02-04 DIAGNOSIS — Z8249 Family history of ischemic heart disease and other diseases of the circulatory system: Secondary | ICD-10-CM | POA: Diagnosis not present

## 2019-02-04 DIAGNOSIS — E559 Vitamin D deficiency, unspecified: Secondary | ICD-10-CM

## 2019-02-04 DIAGNOSIS — Z0000 Encounter for general adult medical examination without abnormal findings: Secondary | ICD-10-CM | POA: Diagnosis not present

## 2019-02-04 DIAGNOSIS — R7303 Prediabetes: Secondary | ICD-10-CM | POA: Diagnosis not present

## 2019-02-04 DIAGNOSIS — Z1211 Encounter for screening for malignant neoplasm of colon: Secondary | ICD-10-CM

## 2019-02-04 DIAGNOSIS — Z79899 Other long term (current) drug therapy: Secondary | ICD-10-CM

## 2019-02-04 DIAGNOSIS — R7309 Other abnormal glucose: Secondary | ICD-10-CM | POA: Diagnosis not present

## 2019-02-04 DIAGNOSIS — F17200 Nicotine dependence, unspecified, uncomplicated: Secondary | ICD-10-CM

## 2019-02-05 LAB — CBC WITH DIFFERENTIAL/PLATELET
Absolute Monocytes: 531 {cells}/uL (ref 200–950)
Basophils Absolute: 51 {cells}/uL (ref 0–200)
Basophils Relative: 0.8 %
Eosinophils Absolute: 122 {cells}/uL (ref 15–500)
Eosinophils Relative: 1.9 %
HCT: 42.2 % (ref 35.0–45.0)
Hemoglobin: 14 g/dL (ref 11.7–15.5)
Lymphs Abs: 2458 {cells}/uL (ref 850–3900)
MCH: 27.5 pg (ref 27.0–33.0)
MCHC: 33.2 g/dL (ref 32.0–36.0)
MCV: 82.9 fL (ref 80.0–100.0)
MPV: 10.7 fL (ref 7.5–12.5)
Monocytes Relative: 8.3 %
Neutro Abs: 3238 {cells}/uL (ref 1500–7800)
Neutrophils Relative %: 50.6 %
Platelets: 270 Thousand/uL (ref 140–400)
RBC: 5.09 Million/uL (ref 3.80–5.10)
RDW: 14.1 % (ref 11.0–15.0)
Total Lymphocyte: 38.4 %
WBC: 6.4 Thousand/uL (ref 3.8–10.8)

## 2019-02-05 LAB — LIPID PANEL
Cholesterol: 197 mg/dL
HDL: 87 mg/dL
LDL Cholesterol (Calc): 95 mg/dL
Non-HDL Cholesterol (Calc): 110 mg/dL
Total CHOL/HDL Ratio: 2.3 (calc)
Triglycerides: 67 mg/dL

## 2019-02-05 LAB — MICROALBUMIN / CREATININE URINE RATIO
Creatinine, Urine: 19 mg/dL — ABNORMAL LOW (ref 20–275)
Microalb Creat Ratio: 16 mcg/mg creat (ref <30)
Microalb, Ur: 0.3 mg/dL

## 2019-02-05 LAB — HEMOGLOBIN A1C
Hgb A1c MFr Bld: 5.2 %{Hb}
Mean Plasma Glucose: 103 (calc)
eAG (mmol/L): 5.7 (calc)

## 2019-02-05 LAB — TSH: TSH: 0.82 m[IU]/L (ref 0.40–4.50)

## 2019-02-05 LAB — VITAMIN D 25 HYDROXY (VIT D DEFICIENCY, FRACTURES): Vit D, 25-Hydroxy: 71 ng/mL (ref 30–100)

## 2019-02-05 LAB — INSULIN, RANDOM: Insulin: 8.6 u[IU]/mL

## 2019-02-05 LAB — MAGNESIUM: Magnesium: 1.9 mg/dL (ref 1.5–2.5)

## 2019-02-07 ENCOUNTER — Encounter: Payer: Self-pay | Admitting: Internal Medicine

## 2019-02-11 DIAGNOSIS — G4733 Obstructive sleep apnea (adult) (pediatric): Secondary | ICD-10-CM | POA: Diagnosis not present

## 2019-02-24 ENCOUNTER — Other Ambulatory Visit: Payer: Self-pay

## 2019-02-24 MED ORDER — AMLODIPINE BESYLATE 2.5 MG PO TABS: 2.5000 mg | ORAL_TABLET | Freq: Two times a day (BID) | ORAL | 5 refills | Status: DC

## 2019-03-10 ENCOUNTER — Other Ambulatory Visit: Payer: Self-pay | Admitting: Adult Health

## 2019-03-10 ENCOUNTER — Other Ambulatory Visit: Payer: Self-pay

## 2019-03-10 ENCOUNTER — Ambulatory Visit (INDEPENDENT_AMBULATORY_CARE_PROVIDER_SITE_OTHER): Payer: PPO | Admitting: Adult Health

## 2019-03-10 ENCOUNTER — Encounter: Payer: Self-pay | Admitting: Adult Health

## 2019-03-10 VITALS — BP 120/66 | HR 59 | Temp 97.7°F | Ht 66.0 in | Wt 162.0 lb

## 2019-03-10 DIAGNOSIS — R142 Eructation: Secondary | ICD-10-CM | POA: Diagnosis not present

## 2019-03-10 DIAGNOSIS — K219 Gastro-esophageal reflux disease without esophagitis: Secondary | ICD-10-CM

## 2019-03-10 DIAGNOSIS — R490 Dysphonia: Secondary | ICD-10-CM

## 2019-03-10 DIAGNOSIS — R140 Abdominal distension (gaseous): Secondary | ICD-10-CM | POA: Diagnosis not present

## 2019-03-10 DIAGNOSIS — R10816 Epigastric abdominal tenderness: Secondary | ICD-10-CM | POA: Diagnosis not present

## 2019-03-10 MED ORDER — SUCRALFATE 1 G PO TABS: 1.0000 g | ORAL_TABLET | Freq: Four times a day (QID) | ORAL | 1 refills | Status: DC

## 2019-03-10 MED ORDER — FAMOTIDINE 20 MG PO TABS: 20.0000 mg | ORAL_TABLET | Freq: Two times a day (BID) | ORAL | 1 refills | Status: DC

## 2019-03-10 NOTE — Patient Instructions (Signed)
Add famotidine - can do 20 mg twice daily, or can take 2 in the evening  Carafate - dissolve in 2-4 oz of water and drink prior to each meal and prior to bedtime - will coat stomach to protect    Gastroesophageal Reflux Disease, Adult Gastroesophageal reflux (GER) happens when acid from the stomach flows up into the tube that connects the mouth and the stomach (esophagus). Normally, food travels down the esophagus and stays in the stomach to be digested. With GER, food and stomach acid sometimes move back up into the esophagus. You may have a disease called gastroesophageal reflux disease (GERD) if the reflux:  Happens often.  Causes frequent or very bad symptoms.  Causes problems such as damage to the esophagus. When this happens, the esophagus becomes sore and swollen (inflamed). Over time, GERD can make small holes (ulcers) in the lining of the esophagus. What are the causes? This condition is caused by a problem with the muscle between the esophagus and the stomach. When this muscle is weak or not normal, it does not close properly to keep food and acid from coming back up from the stomach. The muscle can be weak because of:  Tobacco use.  Pregnancy.  Having a certain type of hernia (hiatal hernia).  Alcohol use.  Certain foods and drinks, such as coffee, chocolate, onions, and peppermint. What increases the risk? You are more likely to develop this condition if you:  Are overweight.  Have a disease that affects your connective tissue.  Use NSAID medicines. What are the signs or symptoms? Symptoms of this condition include:  Heartburn.  Difficult or painful swallowing.  The feeling of having a lump in the throat.  A bitter taste in the mouth.  Bad breath.  Having a lot of saliva.  Having an upset or bloated stomach.  Belching.  Chest pain. Different conditions can cause chest pain. Make sure you see your doctor if you have chest pain.  Shortness of breath  or noisy breathing (wheezing).  Ongoing (chronic) cough or a cough at night.  Wearing away of the surface of teeth (tooth enamel).  Weight loss. How is this treated? Treatment will depend on how bad your symptoms are. Your doctor may suggest:  Changes to your diet.  Medicine.  Surgery. Follow these instructions at home: Eating and drinking   Follow a diet as told by your doctor. You may need to avoid foods and drinks such as: ? Coffee and tea (with or without caffeine). ? Drinks that contain alcohol. ? Energy drinks and sports drinks. ? Bubbly (carbonated) drinks or sodas. ? Chocolate and cocoa. ? Peppermint and mint flavorings. ? Garlic and onions. ? Horseradish. ? Spicy and acidic foods. These include peppers, chili powder, curry powder, vinegar, hot sauces, and BBQ sauce. ? Citrus fruit juices and citrus fruits, such as oranges, lemons, and limes. ? Tomato-based foods. These include red sauce, chili, salsa, and pizza with red sauce. ? Fried and fatty foods. These include donuts, french fries, potato chips, and high-fat dressings. ? High-fat meats. These include hot dogs, rib eye steak, sausage, ham, and bacon. ? High-fat dairy items, such as whole milk, butter, and cream cheese.  Eat small meals often. Avoid eating large meals.  Avoid drinking large amounts of liquid with your meals.  Avoid eating meals during the 2-3 hours before bedtime.  Avoid lying down right after you eat.  Do not exercise right after you eat. Lifestyle   Do not use any  products that contain nicotine or tobacco. These include cigarettes, e-cigarettes, and chewing tobacco. If you need help quitting, ask your doctor.  Try to lower your stress. If you need help doing this, ask your doctor.  If you are overweight, lose an amount of weight that is healthy for you. Ask your doctor about a safe weight loss goal. General instructions  Pay attention to any changes in your symptoms.  Take  over-the-counter and prescription medicines only as told by your doctor. Do not take aspirin, ibuprofen, or other NSAIDs unless your doctor says it is okay.  Wear loose clothes. Do not wear anything tight around your waist.  Raise (elevate) the head of your bed about 6 inches (15 cm).  Avoid bending over if this makes your symptoms worse.  Keep all follow-up visits as told by your doctor. This is important. Contact a doctor if:  You have new symptoms.  You lose weight and you do not know why.  You have trouble swallowing or it hurts to swallow.  You have wheezing or a cough that keeps happening.  Your symptoms do not get better with treatment.  You have a hoarse voice. Get help right away if:  You have pain in your arms, neck, jaw, teeth, or back.  You feel sweaty, dizzy, or light-headed.  You have chest pain or shortness of breath.  You throw up (vomit) and your throw-up looks like blood or coffee grounds.  You pass out (faint).  Your poop (stool) is bloody or black.  You cannot swallow, drink, or eat. Summary  If a person has gastroesophageal reflux disease (GERD), food and stomach acid move back up into the esophagus and cause symptoms or problems such as damage to the esophagus.  Treatment will depend on how bad your symptoms are.  Follow a diet as told by your doctor.  Take all medicines only as told by your doctor. This information is not intended to replace advice given to you by your health care provider. Make sure you discuss any questions you have with your health care provider. Document Released: 12/19/2007 Document Revised: 01/08/2018 Document Reviewed: 01/08/2018 Elsevier Patient Education  2020 [employer].     Hiatal Hernia  A hiatal hernia occurs when part of the stomach slides above the muscle that separates the abdomen from the chest (diaphragm). A person can be born with a hiatal hernia (congenital), or it may develop over time. In almost  all cases of hiatal hernia, only the top part of the stomach pushes through the diaphragm. Many people have a hiatal hernia with no symptoms. The larger the hernia, the more likely it is that you will have symptoms. In some cases, a hiatal hernia allows stomach acid to flow back into the tube that carries food from your mouth to your stomach (esophagus). This may cause heartburn symptoms. Severe heartburn symptoms may mean that you have developed a condition called gastroesophageal reflux disease (GERD). What are the causes? This condition is caused by a weakness in the opening (hiatus) where the esophagus passes through the diaphragm to attach to the upper part of the stomach. A person may be born with a weakness in the hiatus, or a weakness can develop over time. What increases the risk? This condition is more likely to develop in:  Older people. Age is a major risk factor for a hiatal hernia, especially if you are over the age of 1.  Pregnant women.  People who are overweight.  People who have  frequent constipation. What are the signs or symptoms? Symptoms of this condition usually develop in the form of GERD symptoms. Symptoms include:  Heartburn.  Belching.  Indigestion.  Trouble swallowing.  Coughing or wheezing.  Sore throat.  Hoarseness.  Chest pain.  Nausea and vomiting. How is this diagnosed? This condition may be diagnosed during testing for GERD. Tests that may be done include:  X-rays of your stomach or chest.  An upper gastrointestinal (GI) series. This is an X-ray exam of your GI tract that is taken after you swallow a chalky liquid that shows up clearly on the X-ray.  Endoscopy. This is a procedure to look into your stomach using a thin, flexible tube that has a tiny camera and light on the end of it. How is this treated? This condition may be treated by:  Dietary and lifestyle changes to help reduce GERD symptoms.  Medicines. These may include: ?  Over-the-counter antacids. ? Medicines that make your stomach empty more quickly. ? Medicines that block the production of stomach acid (H2 blockers). ? Stronger medicines to reduce stomach acid (proton pump inhibitors).  Surgery to repair the hernia, if other treatments are not helping. If you have no symptoms, you may not need treatment. Follow these instructions at home: Lifestyle and activity  Do not use any products that contain nicotine or tobacco, such as cigarettes and e-cigarettes. If you need help quitting, ask your health care provider.  Try to achieve and maintain a healthy body weight.  Avoid putting pressure on your abdomen. Anything that puts pressure on your abdomen increases the amount of acid that may be pushed up into your esophagus. ? Avoid bending over, especially after eating. ? Raise the head of your bed by putting blocks under the legs. This keeps your head and esophagus higher than your stomach. ? Do not wear tight clothing around your chest or stomach. ? Try not to strain when having a bowel movement, when urinating, or when lifting heavy objects. Eating and drinking  Avoid foods that can worsen GERD symptoms. These may include: ? Fatty foods, like fried foods. ? Citrus fruits, like oranges or lemon. ? Other foods and drinks that contain acid, like orange juice or tomatoes. ? Spicy food. ? Chocolate.  Eat frequent small meals instead of three large meals a day. This helps prevent your stomach from getting too full. ? Eat slowly. ? Do not lie down right after eating. ? Do not eat 1-2 hours before bed.  Do not drink beverages with caffeine. These include cola, coffee, cocoa, and tea.  Do not drink alcohol. General instructions  Take over-the-counter and prescription medicines only as told by your health care provider.  Keep all follow-up visits as told by your health care provider. This is important. Contact a health care provider if:  Your symptoms  are not controlled with medicines or lifestyle changes.  You are having trouble swallowing.  You have coughing or wheezing that will not go away. Get help right away if:  Your pain is getting worse.  Your pain spreads to your arms, neck, jaw, teeth, or back.  You have shortness of breath.  You sweat for no reason.  You feel sick to your stomach (nauseous) or you vomit.  You vomit blood.  You have bright red blood in your stools.  You have black, tarry stools. This information is not intended to replace advice given to you by your health care provider. Make sure you discuss any questions you  have with your health care provider. Document Released: 09/22/2003 Document Revised: 06/14/2017 Document Reviewed: 02/04/2017 Elsevier Patient Education  2020 [employer].

## 2019-03-10 NOTE — Progress Notes (Signed)
Assessment and Plan:  Ilya was seen today for bloated and gi problem.  Diagnoses and all orders for this visit:  Epigastric abdominal tenderness without rebound tenderness/ GERD/chronic hoarseness, bloating belching Normal labs recently, symptoms suggestive of poorly controlled GERD despite high dose PPI Has never had EGD Advised to quit smoking, reduce/stop ETOH Discussed diet, avoiding triggers and other lifestyle changes Has had normal CBC, CMP, UA since onset of symptoms Will refer to GI for evaluation, suspect will require EGD to r/o h. Pylori, ulcer, gastritis, r/o stomach cancer Please go to the ER if you have any severe AB pain, unable to hold down food/water, blood in stool or vomit, chest pain, shortness of breath, or any worsening symptoms.  She is in agreement with plan of care; she fill follow up with me if any new concerning symptoms in the interim  -     Ambulatory referral to Gastroenterology -     famotidine (PEPCID) 20 MG tablet; Take 1 tablet (20 mg total) by mouth 2 (two) times daily. -     sucralfate (CARAFATE) 1 g tablet; Take 1 tablet (1 g total) by mouth 4 (four) times daily.   Further disposition pending results of labs. Discussed med's effects and SE's.   Over 15 minutes of exam, counseling, chart review, and critical decision making was performed.   Future Appointments  Date Time Provider [place]  05/08/2019 10:30 AM Liane Comber, NP [facility] None  08/10/2019 11:15 AM Liane Comber, NP [facility] None  11/09/2019 10:30 AM Unk Pinto, MD [facility] None  02/17/2020  9:00 AM Unk Pinto, MD [facility] None    ------------------------------------------------------------------------------------------------------------------   HPI BP 120/66   Pulse (!) 59   Temp 97.7 F (36.5 C)   Ht 5\' 6"  (1.676 m)   Wt 162 lb (73.5 kg)   SpO2 94%   BMI 26.15 kg/m   68 y.o.female, smoker with extensive hx of GERD, hx of chronic cough with  presents for evaluation of worsening reflux symptoms, burning in throat, belching and bloating, gassiness, cough, persistent bad breath that seems gradually worse over the last 4-5 months;   She was on omeprazole 20 mg daily, increased to 40 mg without improvement, taking daily in the morning prior to breakfast, wakes up with sympotms She notes worse reflux with tomatoes and other aggravating foods  She has tried dietary changes, cut down spicy foods, fried foods, tomatoes/acidic foods without significant improvement. She does drink 2 cups of coffee daily, admits to 3-4 beers daily   She reports intermittent upper abdominal discomfort associated with bloating, mild nausea but no emesis (3 occasions). She denies changes in bowel movements, is regular as long as she keeps her fiber intake high, takes daily metamucil, having daily normal BMs without changes in size/texture.   Per patient has been told she has a hiatal hernia; not seen on abd Ct from 2018   Established with Dr. Deatra Ina for colonoscopies, last 2015 with single polyp, has never had EGD  Since onset of symptoms she has had normal CMP/GFR, CBC, UA  Has seen Dr. Lamonte Sakai for chronic cough/hoarseness with some concern for poorly controlled GERD contributing  S/p partial hysterectomy; ovaries remain; denies urinary sx or vaginal discharge;    Lab Results  Component Value Date   WBC 6.4 02/04/2019   HGB 14.0 02/04/2019   HCT 42.2 02/04/2019   MCV 82.9 02/04/2019   PLT 270 02/04/2019   Lab Results  Component Value Date   NA 138 01/29/2019  K 4.2 01/29/2019   CL 102 01/29/2019   CO2 28 01/29/2019   GLUCOSE 93 01/29/2019   BUN 15 01/29/2019   CREATININE 0.70 01/29/2019   CALCIUM 9.8 01/29/2019   GFRAA 103 01/29/2019   GFRNONAA 89 01/29/2019   Lab Results  Component Value Date   ALT 9 01/29/2019   AST 16 01/29/2019   ALKPHOS 64 12/18/2016   BILITOT 0.6 01/29/2019     Past Medical History:  Diagnosis Date  . Allergy     enviromental  . Anxiety   . Arthritis    osteoarthritis  . COPD (chronic obstructive pulmonary disease) (HCC)    mild  . Diabetes mellitus without complication ([place])    controlled by diet  . Environmental allergies   . Hepatitis    hepatitis A  . Hyperlipidemia   . Hypertension   . Mixed hyperlipidemia 06/15/2013  . PONV (postoperative nausea and vomiting)    second surgery no problems last surgery  . Urgency of urination    Takes oxybutynin; under control at this time     Allergies  Allergen Reactions  . Enalapril Swelling    Caused lips to swell and bloating.  Marland Kitchen No Known Allergies   . Repatha [Evolocumab]     Current Outpatient Medications on File Prior to Visit  Medication Sig  . Alirocumab (PRALUENT) 75 MG/ML SOAJ Inject 75 mg into the skin every 14 (fourteen) days.  Marland Kitchen amLODipine (NORVASC) 2.5 MG tablet Take 1 tablet (2.5 mg total) by mouth 2 (two) times daily.  . Ascorbic Acid (VITAMIN C) 1000 MG tablet Take 1,000 mg by mouth daily.  Marland Kitchen aspirin 81 MG tablet Take 81 mg by mouth every other day.   . bisoprolol-hydrochlorothiazide (ZIAC) 10-6.25 MG tablet Take 0.5 tablets by mouth daily.  Marland Kitchen buPROPion (WELLBUTRIN XL) 150 MG 24 hr tablet TAKE 1 TABLET(150 MG) BY MOUTH EVERY MORNING  . Cholecalciferol (VITAMIN D3) 5000 UNITS TABS Take 5,000 Units by mouth daily.  . clonazePAM (KLONOPIN) 0.5 MG tablet TAKE 1/2 TO 1 TABLET BY MOUTH AT BEDTIME FOR SLEEP  . Cyanocobalamin (VITAMIN B12 PO) Take 1,000 mcg by mouth daily.  Marland Kitchen dextromethorphan-guaiFENesin (MUCINEX DM) 30-600 MG 12hr tablet Take 1 tablet by mouth as needed.   . fluticasone (FLONASE) 50 MCG/ACT nasal spray 1 Spray each nostril daily (Patient taking differently: as needed. 1 Spray each nostril daily)  . losartan (COZAAR) 100 MG tablet Take 1 tablet Daily for BP  . meloxicam (MOBIC) 15 MG tablet TAKE 1 TABLET BY MOUTH DAILY  . methylcellulose oral powder Take by mouth daily.   . montelukast (SINGULAIR) 10 MG tablet  TAKE 1 TABLET(10 MG) BY MOUTH DAILY  . Multiple Vitamins-Minerals (MULTI COMPLETE PO) Take 1 capsule by mouth daily.  Marland Kitchen NIFEdipine (PROCARDIA XL/NIFEDICAL XL) 60 MG 24 hr tablet TAKE 1 TABLET BY MOUTH EVERY NIGHT FOR BLOOD PRESSURE  . Omega-3 Fatty Acids (FISH OIL) 1000 MG CAPS Take 1 capsule by mouth daily.  Marland Kitchen omeprazole (PRILOSEC) 40 MG capsule Take 1 capsule daily for Indigestion & Heartburn  . Tiotropium Bromide Monohydrate (SPIRIVA RESPIMAT) 1.25 MCG/ACT AERS Inhale 2 Inhalers into the lungs daily.  . Zinc 50 MG TABS Take by mouth daily.   No current facility-administered medications on file prior to visit.     ROS: all negative except above.   Physical Exam:  BP 120/66   Pulse (!) 59   Temp 97.7 F (36.5 C)   Ht 5\' 6"  (1.676 m)  Wt 162 lb (73.5 kg)   SpO2 94%   BMI 26.15 kg/m   General Appearance: Well nourished, in no apparent distress. Eyes: conjunctiva no swelling or erythema ENT/Mouth: No erythema, swelling, or exudate on post pharynx.  Tonsils not swollen or erythematous. Hearing normal.  Neck: Supple, thyroid normal.  Respiratory: Respiratory effort normal, BS equal bilaterally without rales, rhonchi, wheezing or stridor.  Cardio: RRR with no MRGs. Brisk peripheral pulses without edema.  Abdomen: Soft, + BS.  She has epigastric and some RUQ tenderness, no guarding, rebound, palpable hernias, masses. Lymphatics: Non tender without lymphadenopathy.  Musculoskeletal: normal gait.  Skin: Warm, dry without rashes, lesions, ecchymosis.  Neuro: Normal muscle tone Psych: Awake and oriented X 3, normal affect, Insight and Judgment appropriate.     Izora Ribas, NP 3:47 PM [facility]

## 2019-03-12 ENCOUNTER — Encounter: Payer: Self-pay | Admitting: Gastroenterology

## 2019-03-14 DIAGNOSIS — G4733 Obstructive sleep apnea (adult) (pediatric): Secondary | ICD-10-CM | POA: Diagnosis not present

## 2019-04-01 ENCOUNTER — Other Ambulatory Visit: Payer: Self-pay | Admitting: Adult Health

## 2019-04-01 DIAGNOSIS — F5101 Primary insomnia: Secondary | ICD-10-CM

## 2019-04-03 ENCOUNTER — Other Ambulatory Visit: Payer: Self-pay | Admitting: Internal Medicine

## 2019-04-03 MED ORDER — LOSARTAN POTASSIUM 100 MG PO TABS: ORAL_TABLET | ORAL | 3 refills | Status: DC

## 2019-04-21 ENCOUNTER — Ambulatory Visit (INDEPENDENT_AMBULATORY_CARE_PROVIDER_SITE_OTHER): Payer: PPO | Admitting: Gastroenterology

## 2019-04-21 ENCOUNTER — Encounter: Payer: Self-pay | Admitting: Gastroenterology

## 2019-04-21 ENCOUNTER — Encounter

## 2019-04-21 ENCOUNTER — Other Ambulatory Visit: Payer: Self-pay

## 2019-04-21 VITALS — BP 136/64 | HR 70 | Temp 97.8°F | Ht 65.0 in | Wt 166.0 lb

## 2019-04-21 DIAGNOSIS — R140 Abdominal distension (gaseous): Secondary | ICD-10-CM

## 2019-04-21 DIAGNOSIS — K5909 Other constipation: Secondary | ICD-10-CM

## 2019-04-21 DIAGNOSIS — K219 Gastro-esophageal reflux disease without esophagitis: Secondary | ICD-10-CM | POA: Diagnosis not present

## 2019-04-21 NOTE — Progress Notes (Signed)
[facility] Consult Note:  History: [name] 04/21/2019  Referring provider: Unk Pinto, MD  Reason for consult/chief complaint: Gastroesophageal Reflux (getting worse. Wants to know if she had an hiatal hernia) and Bloated (swollen and inflammation per patient)   Subjective  HPI:  This is a very pleasant 68 year old woman referred by primary care to see [place], former patient of Dr. Deatra Ina.  She reports many years of reflux with both heartburn and regurgitation.  Symptoms have worsened over the last 9 to 12 months with more frequent heartburn even on increased dose of once daily PPI.  She also has constipation over the last several months, with associated bloating where it feels like her bra and pants feel tight.  She has used both added fiber supplement such as Metamucil and flax as well as MiraLAX without much improvement.  She might have a BM every 1 to 2 days but often feels incompletely evacuated. [name] admits that she has been less active over the last several months and she is unable to go to the gym for water aerobics.  It is difficult for her to walk very far due to her joint replacements and arthritic pain.  Unfortunately, he continues to smoke, having gotten down to half a pack a day earlier this year but now back up to about a pack a day.  Colonoscopy by Dr. Deatra Ina July 2015 had an inflammatory left colon polyp.  ROS:  Review of Systems  Constitutional: Positive for fatigue. Negative for appetite change and unexpected weight change.  HENT: Negative for mouth sores and voice change.   Eyes: Negative for pain and redness.  Respiratory: Positive for cough and shortness of breath.   Cardiovascular: Negative for chest pain and palpitations.  Genitourinary: Positive for enuresis. Negative for dysuria and hematuria.  Musculoskeletal: Positive for arthralgias and back pain. Negative for myalgias.  Skin: Negative for pallor and rash.   Allergic/Immunologic: Positive for environmental allergies.  Neurological: Negative for weakness and headaches.  Hematological: Negative for adenopathy.     Past Medical History: Past Medical History:  Diagnosis Date  . Allergy    enviromental  . Anxiety   . Arthritis    osteoarthritis  . COPD (chronic obstructive pulmonary disease) (HCC)    mild  . Diabetes mellitus without complication ([place])    controlled by diet  . Environmental allergies   . GERD (gastroesophageal reflux disease)   . Hepatitis    hepatitis A  . History of colon polyps   . Hyperlipidemia   . Hypertension   . IBS (irritable bowel syndrome)   . Mixed hyperlipidemia 06/15/2013  . PONV (postoperative nausea and vomiting)    second surgery no problems last surgery  . Urgency of urination    Takes oxybutynin; under control at this time     Past Surgical History: Past Surgical History:  Procedure Laterality Date  . APPENDECTOMY     removed as child  . BACK SURGERY     lumbar fusion  . COLONOSCOPY    . DILATION AND CURETTAGE OF UTERUS    . LUMBAR FUSION  02/06/2012   L 4  L5  . PARTIAL HYSTERECTOMY    . TOTAL HIP ARTHROPLASTY  06/02/2012   Procedure: TOTAL HIP ARTHROPLASTY;  Surgeon: Kerin Salen, MD;  Location: [place];  Service: Orthopedics;  Laterality: Right;  . TOTAL HIP ARTHROPLASTY Left 01/25/2016  . TOTAL HIP ARTHROPLASTY Left 01/25/2016   Procedure: LEFT TOTAL HIP ARTHROPLASTY;  Surgeon: Pilar Plate  Mayer Camel, MD;  Location: [place];  Service: Orthopedics;  Laterality: Left;  . TOTAL KNEE ARTHROPLASTY Right 12/15/2012   Procedure: TOTAL KNEE ARTHROPLASTY;  Surgeon: Kerin Salen, MD;  Location: [place];  Service: Orthopedics;  Laterality: Right;  . TOTAL KNEE ARTHROPLASTY Left 06/18/2016   Procedure: TOTAL KNEE ARTHROPLASTY;  Surgeon: Frederik Pear, MD;  Location: [place];  Service: Orthopedics;  Laterality: Left;     Family History: Family History  Problem Relation Age of Onset  . Diabetes Mother   . Colon  cancer Mother   . Hypertension Father   . CAD Father   . Colon cancer Sister   . Lung cancer Brother   . Hypertension Brother   . Hypertension Brother   . Esophageal cancer Neg Hx   . Rectal cancer Neg Hx   . Stomach cancer Neg Hx     Social History: Social History   Socioeconomic History  . Marital status: Married    Spouse name: Not on file  . Number of children: 2  . Years of education: Not on file  . Highest education level: Not on file  Occupational History  . Not on file  Social Needs  . Financial resource strain: Not on file  . Food insecurity    Worry: Not on file    Inability: Not on file  . Transportation needs    Medical: Not on file    Non-medical: Not on file  Tobacco Use  . Smoking status: Current Every Day Smoker    Packs/day: 0.75    Years: 30.00    Pack years: 22.50    Types: Cigarettes    Start date: 82  . Smokeless tobacco: Never Used  . Tobacco comment: 10 cigarettes/day 02/11/2018  Substance and Sexual Activity  . Alcohol use: Yes    Alcohol/week: 14.0 standard drinks    Types: 14 Glasses of wine per week    Comment: either a glass of wine a night or 2 beers-advised none within 48 hrs of surgery  . Drug use: Yes    Types: Marijuana    Comment: last 06/12/16- advised none within 48 hrs of surgery  . Sexual activity: Yes    Birth control/protection: Surgical  Lifestyle  . Physical activity    Days per week: Not on file    Minutes per session: Not on file  . Stress: Not on file  Relationships  . Social Herbalist on phone: Not on file    Gets together: Not on file    Attends religious service: Not on file    Active member of club or organization: Not on file    Attends meetings of clubs or organizations: Not on file    Relationship status: Not on file  Other Topics Concern  . Not on file  Social History Narrative  . Not on file    Allergies: Allergies  Allergen Reactions  . Enalapril Swelling    Caused lips to swell  and bloating.  Marland Kitchen No Known Allergies   . Repatha [Evolocumab]     Outpatient Meds: Current Outpatient Medications  Medication Sig Dispense Refill  . Alirocumab (PRALUENT) 75 MG/ML SOAJ Inject 75 mg into the skin every 14 (fourteen) days. 2 pen 11  . amLODipine (NORVASC) 2.5 MG tablet Take 1 tablet (2.5 mg total) by mouth 2 (two) times daily. 60 tablet 5  . Ascorbic Acid (VITAMIN C) 1000 MG tablet Take 1,000 mg by mouth daily.    Marland Kitchen  aspirin 81 MG tablet Take 81 mg by mouth every other day.     . bisoprolol-hydrochlorothiazide (ZIAC) 10-6.25 MG tablet Take 0.5 tablets by mouth daily. 45 tablet 3  . Cholecalciferol (VITAMIN D3) 5000 UNITS TABS Take 5,000 Units by mouth daily.    . clonazePAM (KLONOPIN) 0.5 MG tablet TAKE 1/2 TO 1 TABLET BY MOUTH AT BEDTIME FOR SLEEP 30 tablet 0  . Cyanocobalamin (VITAMIN B12 PO) Take 1,000 mcg by mouth daily.    Marland Kitchen dextromethorphan-guaiFENesin (MUCINEX DM) 30-600 MG 12hr tablet Take 1 tablet by mouth as needed.     . fluticasone (FLONASE) 50 MCG/ACT nasal spray 1 Spray each nostril daily (Patient taking differently: as needed. 1 Spray each nostril daily) 48 g 1  . losartan (COZAAR) 100 MG tablet Take 1 tablet Daily for BP 90 tablet 3  . meloxicam (MOBIC) 15 MG tablet TAKE 1 TABLET BY MOUTH DAILY 90 tablet 3  . methylcellulose oral powder Take by mouth daily.     . montelukast (SINGULAIR) 10 MG tablet TAKE 1 TABLET(10 MG) BY MOUTH DAILY 90 tablet 3  . Multiple Vitamins-Minerals (MULTI COMPLETE PO) Take 1 capsule by mouth daily.    Marland Kitchen NIFEdipine (PROCARDIA XL/NIFEDICAL XL) 60 MG 24 hr tablet TAKE 1 TABLET BY MOUTH EVERY NIGHT FOR BLOOD PRESSURE 90 tablet 1  . Omega-3 Fatty Acids (FISH OIL) 1000 MG CAPS Take 1 capsule by mouth daily.    Marland Kitchen omeprazole (PRILOSEC) 40 MG capsule Take 1 capsule daily for Indigestion & Heartburn 90 capsule 3  . sucralfate (CARAFATE) 1 g tablet TAKE 1 TABLET(1 GRAM) BY MOUTH FOUR TIMES DAILY 360 tablet 0  . Tiotropium Bromide Monohydrate  (SPIRIVA RESPIMAT) 1.25 MCG/ACT AERS Inhale 2 Inhalers into the lungs daily.    . Zinc 50 MG TABS Take by mouth daily.     No current facility-administered medications for this visit.       ___________________________________________________________________ Objective   Exam:  BP 136/64   Pulse 70   Temp 97.8 F (36.6 C)   Ht 5\' 5"  (1.651 m)   Wt 166 lb (75.3 kg)   BMI 27.62 kg/m    General: Does not and well-appearing woman, somewhat raspy vocal quality  Eyes: sclera anicteric, no redness  ENT: oral mucosa moist without lesions, no cervical or supraclavicular lymphadenopathy  CV: RRR without murmur, S1/S2, no JVD, no peripheral edema  Resp: clear to auscultation bilaterally, normal RR and effort noted  GI: soft, no tenderness, with active bowel sounds. No guarding or palpable organomegaly noted.  Skin; warm and dry, no rash or jaundice noted  Neuro: awake, alert and oriented x 3. Normal gross motor function and fluent speech  Labs:  CBC Latest Ref Rng & Units 02/04/2019 07/24/2018 04/17/2018  WBC 3.8 - 10.8 Thousand/uL 6.4 6.2 6.5  Hemoglobin 11.7 - 15.5 g/dL 14.0 13.4 13.2  Hematocrit 35.0 - 45.0 % 42.2 39.5 39.7  Platelets 140 - 400 Thousand/uL 270 248 260     Radiologic Studies:  05/2013 Barium swallow:  CLINICAL DATA:  Dysphagia, esophageal reflux   EXAM: ESOPHOGRAM / BARIUM SWALLOW / BARIUM TABLET STUDY   TECHNIQUE: Combined double contrast and single contrast examination performed using effervescent crystals, thick barium liquid, and thin barium liquid. The patient was observed with fluoroscopy swallowing a 42mm barium sulphate tablet.   COMPARISON:  None.   FLUOROSCOPY TIME:  1 min 18 seconds   FINDINGS: Normal esophageal peristalsis.   No fixed esophageal narrowing or stricture. A 13 mm  barium tablet passed into the stomach without delay.   No hiatal hernia is seen.   No gastroesophageal reflux is demonstrated.   IMPRESSION: Negative  double contrast esophagram.     Electronically Signed   By: Julian Hy M.D.   On: 05/18/2013 13:02    Assessment: Encounter Diagnoses  Name Primary?  . GERD without esophagitis Yes  . Abdominal bloating   . Chronic constipation     Worsening heartburn despite escalation of antacid therapy.  We discussed the nature of GERD, need for diet and lifestyle changes in the limitation of acid suppression therapy.  She unquestionably needs to stop smoking for long-term improvement in the symptoms. No dysphagia or odynophagia.  Longstanding symptoms and smoking history increased risk of Barrett's esophagus.  Her bloating is probably multifactorial, suspect excess fiber intake, and recommended she decrease that in favor of just MiraLAX 1-2 times daily.  Constipation may be at least partially related to recent decrease in physical activity.  Plan:  In addition to above, upper endoscopy was recommended.  She was agreeable after discussion of procedure and risks.  The benefits and risks of the planned procedure were described in detail with the patient or (when appropriate) their health care proxy.  Risks were outlined as including, but not limited to, bleeding, infection, perforation, adverse medication reaction leading to cardiac or pulmonary decompensation, pancreatitis (if ERCP).  The limitation of incomplete mucosal visualization was also discussed.  No guarantees or warranties were given.  Also, she has been using Carafate lately without much improvement.  I recommended she dissolve the tablets and 2 teaspoons of water rather than swallow them whole, as this will adhere to the esophageal mucosa more effectively.  Thank you for the courtesy of this consult.  Please call me with any questions or concerns.  Nelida Meuse III  CC: Referring provider noted above

## 2019-04-21 NOTE — Patient Instructions (Addendum)
If you are age 68 or older, your body mass index should be between 23-30. Your Body mass index is 27.62 kg/m. If this is out of the aforementioned range listed, please consider follow up with your Primary Care Provider.  If you are age 27 or younger, your body mass index should be between 19-25. Your Body mass index is 27.62 kg/m. If this is out of the aformentioned range listed, please consider follow up with your Primary Care Provider.   You have been scheduled for an endoscopy. Please follow written instructions given to you at your visit today. If you use inhalers (even only as needed), please bring them with you on the day of your procedure. Your physician has requested that you go to www.startemmi.com and enter the access code given to you at your visit today. This web site gives a general overview about your procedure. However, you should still follow specific instructions given to you by our office regarding your preparation for the procedure.  It was a pleasure to see you today!  Dr. Loletha Carrow

## 2019-04-22 ENCOUNTER — Other Ambulatory Visit: Payer: Self-pay

## 2019-04-22 DIAGNOSIS — K5909 Other constipation: Secondary | ICD-10-CM

## 2019-04-22 DIAGNOSIS — K219 Gastro-esophageal reflux disease without esophagitis: Secondary | ICD-10-CM

## 2019-04-22 DIAGNOSIS — R140 Abdominal distension (gaseous): Secondary | ICD-10-CM

## 2019-05-04 ENCOUNTER — Encounter: Payer: Self-pay | Admitting: Gastroenterology

## 2019-05-05 ENCOUNTER — Telehealth: Payer: Self-pay

## 2019-05-05 NOTE — Telephone Encounter (Signed)
Covid-19 screening questions   Do you now or have you had a fever in the last 14 days? NO   Do you have any respiratory symptoms of shortness of breath or cough now or in the last 14 days? NO  Do you have any family members or close contacts with diagnosed or suspected Covid-19 in the past 14 days? NO  Have you been tested for Covid-19 and found to be positive? NO        

## 2019-05-06 ENCOUNTER — Encounter: Payer: Self-pay | Admitting: Gastroenterology

## 2019-05-06 ENCOUNTER — Ambulatory Visit (AMBULATORY_SURGERY_CENTER): Payer: PPO | Admitting: Gastroenterology

## 2019-05-06 ENCOUNTER — Other Ambulatory Visit: Payer: Self-pay

## 2019-05-06 VITALS — BP 143/65 | HR 53 | Temp 98.6°F | Resp 13 | Ht 65.0 in | Wt 166.0 lb

## 2019-05-06 DIAGNOSIS — R140 Abdominal distension (gaseous): Secondary | ICD-10-CM

## 2019-05-06 DIAGNOSIS — K219 Gastro-esophageal reflux disease without esophagitis: Secondary | ICD-10-CM

## 2019-05-06 DIAGNOSIS — K2100 Gastro-esophageal reflux disease with esophagitis, without bleeding: Secondary | ICD-10-CM | POA: Diagnosis not present

## 2019-05-06 HISTORY — PX: UPPER GASTROINTESTINAL ENDOSCOPY: SHX188

## 2019-05-06 MED ORDER — SODIUM CHLORIDE 0.9 % IV SOLN: 500.0000 mL | Freq: Once | INTRAVENOUS | Status: DC

## 2019-05-06 NOTE — Progress Notes (Signed)
Called to room to assist during endoscopic procedure.  Patient ID and intended procedure confirmed with present staff. Received instructions for my participation in the procedure from the performing physician.  

## 2019-05-06 NOTE — Op Note (Signed)
[facility] Patient Name: [name] Procedure Date: 05/06/2019 10:39 AM MRN: [id number] Endoscopist: [place]. Loletha Carrow , MD Age: 68 Referring MD:  Date of Birth: [birth year] Gender: Female Account #: [id number] Procedure:                Upper GI endoscopy Indications:              Esophageal reflux symptoms that persist despite                            appropriate therapy Medicines:                Monitored Anesthesia Care Procedure:                Pre-Anesthesia Assessment:                           - Prior to the procedure, a History and Physical                            was performed, and patient medications and                            allergies were reviewed. The patient's tolerance of                            previous anesthesia was also reviewed. The risks                            and benefits of the procedure and the sedation                            options and risks were discussed with the patient.                            All questions were answered, and informed consent                            was obtained. Prior Anticoagulants: The patient has                            taken no previous anticoagulant or antiplatelet                            agents except for aspirin. ASA Grade Assessment: II                            - A patient with mild systemic disease. After                            reviewing the risks and benefits, the patient was                            deemed in satisfactory condition to undergo the  procedure.                           After obtaining informed consent, the endoscope was                            passed under direct vision. Throughout the                            procedure, the patient's blood pressure, pulse, and                            oxygen saturations were monitored continuously. The                            Endoscope was introduced through the mouth, and                             advanced to the second part of duodenum. The upper                            GI endoscopy was accomplished without difficulty.                            The patient tolerated the procedure well. Scope In: Scope Out: Findings:                 Islands of salmon-colored mucosa were present at 39                            cm. No other visible abnormalities were present.                            Biopsies were taken with a cold forceps for                            histology.                           The exam of the esophagus was otherwise normal.                           The stomach was normal.                           The cardia and gastric fundus were normal on                            retroflexion.                           The examined duodenum was normal. Complications:            No immediate complications. Estimated Blood Loss:     Estimated blood loss was minimal. Impression:               - Salmon-colored mucosa. Biopsied.                           -  Normal stomach.                           - Normal examined duodenum. Recommendation:           - Patient has a contact number available for                            emergencies. The signs and symptoms of potential                            delayed complications were discussed with the                            patient. Return to normal activities tomorrow.                            Written discharge instructions were provided to the                            patient.                           - Resume previous diet.                           - Continue present medications.                           - Await pathology results.                           - Follow an antireflux regimen.                           - Discontinue the use of any products containing                            nicotine indefinitely. Stop smoking. Candise Crabtree L. Loletha Carrow, MD 05/06/2019 10:58:16 AM This report has been signed electronically.

## 2019-05-06 NOTE — Patient Instructions (Signed)
THE BIOPSIES TAKEN TODAY HAVE BEEN SENT FOR PATHOLOGY.  IT WILL TAKE 10-14 DAYS TO RECEIVE THE RESULTS.  YOU MAY RESUME YOUR PREVIOUS DIET AND MEDICATION SCHEDULE.  FOLLOW AN ANTIREFLUX REGIMEN.    STOP SMOKING OR USING ANY PRODUCTS WITH NICOTINE.  [place] YOU FOR ALLOWING [place] TO CARE FOR YOU TODAY!!!  YOU HAD AN ENDOSCOPIC PROCEDURE TODAY AT [facility]:   Refer to the procedure report that was given to you for any specific questions about what was found during the examination.  If the procedure report does not answer your questions, please call your gastroenterologist to clarify.  If you requested that your care partner not be given the details of your procedure findings, then the procedure report has been included in a sealed envelope for you to review at your convenience later.  YOU SHOULD EXPECT: Some feelings of bloating in the abdomen. Passage of more gas than usual.  Walking can help get rid of the air that was put into your GI tract during the procedure and reduce the bloating. If you had a lower endoscopy (such as a colonoscopy or flexible sigmoidoscopy) you may notice spotting of blood in your stool or on the toilet paper. If you underwent a bowel prep for your procedure, you may not have a normal bowel movement for a few days.  Please Note:  You might notice some irritation and congestion in your nose or some drainage.  This is from the oxygen used during your procedure.  There is no need for concern and it should clear up in a day or so.  SYMPTOMS TO REPORT IMMEDIATELY:   Following upper endoscopy (EGD)  Vomiting of blood or coffee ground material  New chest pain or pain under the shoulder blades  Painful or persistently difficult swallowing  New shortness of breath  Fever of 100F or higher  Black, tarry-looking stools  For urgent or emergent issues, a gastroenterologist can be reached at any hour by calling [phone].   DIET:  We do recommend a small meal  at first, but then you may proceed to your regular diet.  Drink plenty of fluids but you should avoid alcoholic beverages for 24 hours.  ACTIVITY:  You should plan to take it easy for the rest of today and you should NOT DRIVE or use heavy machinery until tomorrow (because of the sedation medicines used during the test).    FOLLOW UP: Our staff will call the number listed on your records 48-72 hours following your procedure to check on you and address any questions or concerns that you may have regarding the information given to you following your procedure. If we do not reach you, we will leave a message.  We will attempt to reach you two times.  During this call, we will ask if you have developed any symptoms of COVID 19. If you develop any symptoms (ie: fever, flu-like symptoms, shortness of breath, cough etc.) before then, please call [phone].  If you test positive for Covid 19 in the 2 weeks post procedure, please call and report this information to [place].    If any biopsies were taken you will be contacted by phone or by letter within the next 1-3 weeks.  Please call us at [phone] if you have not heard about the biopsies in 3 weeks.    SIGNATURES/CONFIDENTIALITY: You and/or your care partner have signed paperwork which will be entered into your electronic medical record.  These signatures attest to  the fact that that the information above on your After Visit Summary has been reviewed and is understood.  Full responsibility of the confidentiality of this discharge information lies with you and/or your care-partner.

## 2019-05-06 NOTE — Progress Notes (Signed)
PT taken to PACU. Monitors in place. VSS. Report given to RN. 

## 2019-05-06 NOTE — Progress Notes (Signed)
Pt's states no medical or surgical changes since previsit or office visit.  KA - temp CW - vitals 

## 2019-05-07 NOTE — Progress Notes (Signed)
FOLLOW UP  Assessment and Plan:   Atherosclerosis of aorta By imaging Control blood pressure, cholesterol, glucose, increase exercise.   Hypertension Well controlled with current medications  Monitor blood pressure at home; patient to call if consistently greater than 130/80 Continue DASH diet.   Reminder to go to the ER if any CP, SOB, nausea, dizziness, severe HA, changes vision/speech, left arm numbness and tingling and jaw pain.  Cholesterol Currently at goal;  Statin intolerance; on praluent via lipid clinic Continue low cholesterol diet and exercise.  Check lipid panel and forward to PharmD Raquel Rodriguez-Guzman   Other abnormal glucose  Recent A1Cs at goal Discussed diet/exercise, weight management  Defer A1C; check CMP  Overweight Long discussion about weight loss, diet, and exercise Recommended diet heavy in fruits and veggies and low in animal meats, cheeses, and dairy products, appropriate calorie intake Discussed ideal weight for height and initial weight goal (150lb) Patient will work on increasing exercise, watching portions Will follow up in 3 months  Vitamin D Def At goal at last visit; continue supplementation to maintain goal of 70-100 Defer Vit D level  GERD Continue PPI; didn't tolerate transition to H2 inhibitor; Carafate BID is helpful Pending EGD pathology report via Dr. Loletha Carrow Discussed diet, avoiding triggers and other lifestyle changes - monitor magnesium   Tobacco use/Smoker's cough ([place]) Discussed risks associated with tobacco use and advised to reduce or quit Patient is not ready to do so, but advised to consider strongly Will follow up at the next visit  OSA on CPAP Managed by pulm; overdue follow up She reports not wearing due to mask intolerance; has tried 4 different types Encouraged her to scheduled follow up; reviewed risks associated with untreated OSA  Emphysema (HCC)/ GOLD 0 Follows with pulmonology Advised to stop  smoking; imaging due; has never had CT lung cancer screening; after discussion will pursue today due to current smoker with 30+ pack year history Fairly controlled by currently inhalers   Anxiety Recently well controlled with occasional use of low dose klonopin Stress management techniques discussed, increase water, good sleep hygiene discussed, increase exercise, and increase veggies.   Need for influenza vaccine - high dose quadrivalent administered without complication  Continue diet and meds as discussed. Further disposition pending results of labs. Discussed med's effects and SE's.   Over 30 minutes of exam, counseling, chart review, and critical decision making was performed.   Future Appointments  Date Time Provider [place]  08/10/2019 11:15 AM Liane Comber, NP [facility] None  11/09/2019 10:30 AM Unk Pinto, MD [facility] None  02/17/2020  9:00 AM Unk Pinto, MD [facility] None    ----------------------------------------------------------------------------------------------------------------------  HPI 68 y.o. female  presents for 3 month follow up on hypertension, cholesterol, glucose management, weight and vitamin D deficiency.   She has OSA, newly on CPAP and working up to get to 4 hours per Dr. Lamonte Sakai pulmonology. Today reports not wearing due to mask intolerance, has tried 4 different types. She will follow up with pulm.   she currently continues to smoke 0.75 pack a day; discussed risks associated with smoking, patient is not ready to quit. She has emphysema by imaging; GOLD 0 per pulmonology and PFTs. She is on spiriva daily which she finds helpful.  She has 35 pack/year smoking history; she has never undergone screening low dose lung CT which she is agreeable to doing.   she has a diagnosis of acid reflux which is currently managed by omeprazole 40 mg daily and PRN Carafate,  doing twice daily, dissolved in 1 tbsp of water. She recently was seen here  reporting severe breakthrough sx, bloating, belching despite excalation of antacid therapy and was referred to GI Dr. Wilfrid Lund and underwent EGD on 05/06/2019 which was felt to be a normal exam excepting salmon-colored mucosa which was biopsied and pending pathology report.   He recommended smoking cessation; esophogram/barium swallow was normal He recommended miralax 1-2 times daily and increased exercise for bloating/constipation. She reports bloating has improved since.   She has a diagnosis of anxiety and is current prescribed PRN klonopin; uses rarely, 3-4 times per month recently.  BMI is Body mass index is 27.46 kg/m., she has been working on diet and exercise, was going to the GYM 3 days a week but not since covid. She doesn't enjoy walking outdoors, but tries to do extra stair climbing. She reports chronic lower back pain is improving with increased exercise at the gym.  Wt Readings from Last 3 Encounters:  05/08/19 165 lb (74.8 kg)  05/06/19 166 lb (75.3 kg)  04/21/19 166 lb (75.3 kg)   She has aortic atherosclerosis by imaging.  She follows with Dr. Stanford Breed; Carotid Dopplers July 2013 showed less than 50% bilateral stenosis.Echocardiogram September 2018 showed normal LV function, trace aortic insufficiency and mild left atrial enlargement.  Stress echocardiogram October 2018 showed no stress-induced wall motion abnormalities.  Her blood pressure has been controlled at home, today their BP is BP: 124/76  She does workout. She denies chest pain, shortness of breath, dizziness.   She is on cholesterol medication (praluent via cardiology due to statin intolerance). Her cholesterol is at goal. Levels are checked q66m via our office and forwarded to lipid clinic PharmD Raquel Rodriguez-Guzman. The cholesterol last visit was:   Lab Results  Component Value Date   CHOL 197 02/04/2019   HDL 87 02/04/2019   LDLCALC 95 02/04/2019   TRIG 67 02/04/2019   CHOLHDL 2.3 02/04/2019    She  has been working on diet and exercise for glucose management (hx of prediabetes recently well controlled), and denies foot ulcerations, increased appetite, nausea, paresthesia of the feet, polydipsia, polyuria and visual disturbances. Last A1C in the office was:  Lab Results  Component Value Date   HGBA1C 5.2 02/04/2019   Patient is on Vitamin D supplement.   Lab Results  Component Value Date   VD25OH 1 02/04/2019        Current Medications:  Current Outpatient Medications on File Prior to Visit  Medication Sig  . Alirocumab (PRALUENT) 75 MG/ML SOAJ Inject 75 mg into the skin every 14 (fourteen) days.  Marland Kitchen amLODipine (NORVASC) 2.5 MG tablet Take 1 tablet (2.5 mg total) by mouth 2 (two) times daily.  . Ascorbic Acid (VITAMIN C) 1000 MG tablet Take 1,000 mg by mouth daily.  Marland Kitchen aspirin 81 MG tablet Take 81 mg by mouth every other day.   . bisoprolol-hydrochlorothiazide (ZIAC) 10-6.25 MG tablet Take 0.5 tablets by mouth daily.  . Cholecalciferol (VITAMIN D3) 5000 UNITS TABS Take 5,000 Units by mouth daily.  . clonazePAM (KLONOPIN) 0.5 MG tablet TAKE 1/2 TO 1 TABLET BY MOUTH AT BEDTIME FOR SLEEP  . Cyanocobalamin (VITAMIN B12 PO) Take 1,000 mcg by mouth daily.  Marland Kitchen dextromethorphan-guaiFENesin (MUCINEX DM) 30-600 MG 12hr tablet Take 1 tablet by mouth as needed.   . fluticasone (FLONASE) 50 MCG/ACT nasal spray 1 Spray each nostril daily (Patient taking differently: as needed. 1 Spray each nostril daily)  . losartan (COZAAR) 100 MG  tablet Take 1 tablet Daily for BP  . meloxicam (MOBIC) 15 MG tablet TAKE 1 TABLET BY MOUTH DAILY  . methylcellulose oral powder Take by mouth daily.   . montelukast (SINGULAIR) 10 MG tablet TAKE 1 TABLET(10 MG) BY MOUTH DAILY  . Multiple Vitamins-Minerals (MULTI COMPLETE PO) Take 1 capsule by mouth daily.  Marland Kitchen NIFEdipine (PROCARDIA XL/NIFEDICAL XL) 60 MG 24 hr tablet TAKE 1 TABLET BY MOUTH EVERY NIGHT FOR BLOOD PRESSURE  . Omega-3 Fatty Acids (FISH OIL) 1000 MG CAPS  Take 1 capsule by mouth daily.  Marland Kitchen omeprazole (PRILOSEC) 40 MG capsule Take 1 capsule daily for Indigestion & Heartburn  . sucralfate (CARAFATE) 1 g tablet TAKE 1 TABLET(1 GRAM) BY MOUTH FOUR TIMES DAILY  . Tiotropium Bromide Monohydrate (SPIRIVA RESPIMAT) 1.25 MCG/ACT AERS Inhale 2 Inhalers into the lungs daily.  . Zinc 50 MG TABS Take by mouth daily.   No current facility-administered medications on file prior to visit.      Allergies:  Allergies  Allergen Reactions  . Enalapril Swelling    Caused lips to swell and bloating.  Maryelizabeth Kaufmann [Evolocumab]      Medical History:  Past Medical History:  Diagnosis Date  . Allergy    enviromental  . Anxiety   . Arthritis    osteoarthritis  . COPD (chronic obstructive pulmonary disease) (HCC)    mild  . Diabetes mellitus without complication ([place])    controlled by diet  . Environmental allergies   . GERD (gastroesophageal reflux disease)   . Hepatitis    hepatitis A  . History of colon polyps   . Hyperlipidemia   . Hypertension   . IBS (irritable bowel syndrome)   . Mixed hyperlipidemia 06/15/2013  . PONV (postoperative nausea and vomiting)    second surgery no problems last surgery  . Urgency of urination    Takes oxybutynin; under control at this time   Family history- Reviewed and unchanged Social history- Reviewed and unchanged   Review of Systems:  Review of Systems  Constitutional: Negative for malaise/fatigue and weight loss.  HENT: Negative for hearing loss and tinnitus.   Eyes: Negative for blurred vision and double vision.  Respiratory: Positive for cough. Negative for shortness of breath and wheezing.   Cardiovascular: Negative for chest pain, palpitations, orthopnea, claudication and leg swelling.  Gastrointestinal: Positive for heartburn. Negative for abdominal pain, blood in stool, constipation, diarrhea, melena, nausea and vomiting.  Genitourinary: Negative.   Musculoskeletal: Negative for back pain, joint  pain and myalgias.  Skin: Negative for rash.  Neurological: Negative for dizziness, tingling, sensory change, weakness and headaches.  Endo/Heme/Allergies: Negative for polydipsia.  Psychiatric/Behavioral: Negative.   All other systems reviewed and are negative.   Physical Exam: BP 124/76   Pulse 66   Temp (!) 97.3 F (36.3 C)   Wt 165 lb (74.8 kg)   SpO2 96%   BMI 27.46 kg/m  Wt Readings from Last 3 Encounters:  05/08/19 165 lb (74.8 kg)  05/06/19 166 lb (75.3 kg)  04/21/19 166 lb (75.3 kg)   General Appearance: Well nourished, in no apparent distress. Eyes: PERRLA, EOMs, conjunctiva no swelling or erythema Sinuses: No Frontal/maxillary tenderness ENT/Mouth: Ext aud canals clear, TMs without erythema, bulging. No erythema, swelling, or exudate on post pharynx.  Tonsils not swollen or erythematous. Hearing normal. Occasional cough.  Neck: Supple, thyroid normal.  Respiratory: Respiratory effort normal, BS equal bilaterally without rales, rhonchi, wheezing or stridor.  Cardio: RRR with no MRGs. Brisk peripheral  pulses without edema.  Abdomen: Soft, + BS.  Non tender, no guarding, rebound, hernias, masses. Lymphatics: Non tender without lymphadenopathy.  Musculoskeletal: Full ROM, 5/5 strength, Normal gait Skin: Warm, dry without rashes, lesions, ecchymosis.  Neuro: Cranial nerves intact. No cerebellar symptoms.  Psych: Awake and oriented X 3, normal affect, Insight and Judgment appropriate.    Izora Ribas, NP 10:42 AM [facility]

## 2019-05-08 ENCOUNTER — Encounter: Payer: Self-pay | Admitting: Gastroenterology

## 2019-05-08 ENCOUNTER — Other Ambulatory Visit: Payer: Self-pay

## 2019-05-08 ENCOUNTER — Encounter: Payer: Self-pay | Admitting: Adult Health

## 2019-05-08 ENCOUNTER — Ambulatory Visit (INDEPENDENT_AMBULATORY_CARE_PROVIDER_SITE_OTHER): Payer: PPO | Admitting: Adult Health

## 2019-05-08 ENCOUNTER — Telehealth: Payer: Self-pay

## 2019-05-08 VITALS — BP 124/76 | HR 66 | Temp 97.3°F | Wt 165.0 lb

## 2019-05-08 DIAGNOSIS — I10 Essential (primary) hypertension: Secondary | ICD-10-CM | POA: Diagnosis not present

## 2019-05-08 DIAGNOSIS — R7309 Other abnormal glucose: Secondary | ICD-10-CM | POA: Diagnosis not present

## 2019-05-08 DIAGNOSIS — K219 Gastro-esophageal reflux disease without esophagitis: Secondary | ICD-10-CM | POA: Diagnosis not present

## 2019-05-08 DIAGNOSIS — Z23 Encounter for immunization: Secondary | ICD-10-CM | POA: Diagnosis not present

## 2019-05-08 DIAGNOSIS — E782 Mixed hyperlipidemia: Secondary | ICD-10-CM | POA: Diagnosis not present

## 2019-05-08 DIAGNOSIS — I700 Atherosclerosis of aorta: Secondary | ICD-10-CM | POA: Diagnosis not present

## 2019-05-08 DIAGNOSIS — F17200 Nicotine dependence, unspecified, uncomplicated: Secondary | ICD-10-CM | POA: Diagnosis not present

## 2019-05-08 DIAGNOSIS — J432 Centrilobular emphysema: Secondary | ICD-10-CM

## 2019-05-08 DIAGNOSIS — J410 Simple chronic bronchitis: Secondary | ICD-10-CM

## 2019-05-08 DIAGNOSIS — E559 Vitamin D deficiency, unspecified: Secondary | ICD-10-CM

## 2019-05-08 DIAGNOSIS — Z122 Encounter for screening for malignant neoplasm of respiratory organs: Secondary | ICD-10-CM | POA: Diagnosis not present

## 2019-05-08 DIAGNOSIS — F419 Anxiety disorder, unspecified: Secondary | ICD-10-CM | POA: Diagnosis not present

## 2019-05-08 DIAGNOSIS — Z79899 Other long term (current) drug therapy: Secondary | ICD-10-CM | POA: Diagnosis not present

## 2019-05-08 NOTE — Telephone Encounter (Signed)
  Follow up Call-  Call back number 05/06/2019  Post procedure Call Back phone  # [phone]  Permission to leave phone message Yes  Some recent data might be hidden     Patient questions:  Do you have a fever, pain , or abdominal swelling? No. Pain Score  0 *  Have you tolerated food without any problems? Yes.    Have you been able to return to your normal activities? Yes.    Do you have any questions about your discharge instructions: Diet   No. Medications  No. Follow up visit  No.  Do you have questions or concerns about your Care? No.  Actions: * If pain score is 4 or above: No action needed, pain <4.  1. Have you developed a fever since your procedure? No  2.   Have you had an respiratory symptoms (SOB or cough) since your procedure? No  3.   Have you tested positive for COVID 19 since your procedure No  4.   Have you had any family members/close contacts diagnosed with the COVID 19 since your procedure?  No   If yes to any of these questions please route to Joylene John, RN and Alphonsa Gin, RN.

## 2019-05-08 NOTE — Patient Instructions (Addendum)
Goals    . LDL CALC < 70    . Quit Smoking    . Weight (lb) < 150 lb (68 kg)       ? Due follow up with Dr. Stanford Breed  Last was 05/14/2019 - please call to schedule  ? Need carotid artery ultrasound - or we can order if he doesn't plan to       [place]  [id number] for more information or for a free program for smoking cessation help.   You can call QUIT SMART 1-800-QUIT-NOW for free nicotine patches or replacement therapy- if they are out- keep calling  [facility] Can call for smoking cessation classes, [phone]  If you have a smart phone, please look up Smoke Free app, this will help you stay on track and give you information about money you have saved, life that you have gained back and a ton of more information.     ADVANTAGES OF QUITTING SMOKING  Within 20 minutes, blood pressure decreases. Your pulse is at normal level.  After 8 hours, carbon monoxide levels in the blood return to normal. Your oxygen level increases.  After 24 hours, the chance of having a heart attack starts to decrease. Your breath, hair, and body stop smelling like smoke.  After 48 hours, damaged nerve endings begin to recover. Your sense of taste and smell improve.  After 72 hours, the body is virtually free of nicotine. Your bronchial tubes relax and breathing becomes easier.  After 2 to 12 weeks, lungs can hold more air. Exercise becomes easier and circulation improves.  After 1 year, the risk of coronary heart disease is cut in half.  After 5 years, the risk of stroke falls to the same as a nonsmoker.  After 10 years, the risk of lung cancer is cut in half and the risk of other cancers decreases significantly.  After 15 years, the risk of coronary heart disease drops, usually to the level of a nonsmoker.  You will have extra money to spend on things other than cigarettes.    Lung Cancer Screening A lung cancer screening is a  test that checks for lung cancer. Lung cancer screening is done to look for lung cancer in its very early stages, before it spreads and becomes harder to treat and before symptoms appear. Finding cancer early improves the chances of successful treatment. It may save your life. Should I be screened for lung cancer? You should be screened for lung cancer if all of these apply:  You currently smoke or you have quit smoking within the past 15 years.  You are 37-61 years old. Screening may be recommended up to age 64 depending on your overall health and other factors.  You are in good general health.  You have a smoking history of 1 pack a day for 30 years or 2 packs a day for 15 years. Screening may also be recommended if you are at high risk for the disease. You may be at high risk if:  You have a family history of lung cancer.  You have been exposed to asbestos.  You have chronic obstructive pulmonary disease (COPD).  You have a history of previous lung cancer. How often should I be screened for lung cancer?  If you are at risk for lung cancer, it is recommended that you are screened once a year. The recommended screening test is a low-dose CT scan. How can I lower my risk  of lung cancer? To lower your risk of developing lung cancer:  If you smoke, stop smoking all tobacco products.  Avoid secondhand smoke.  Avoid exposure to radiation.  Avoid exposure to radon gas. Have your home checked for radon regularly.  Avoid things that cause cancer (carcinogens).  Avoid living or working in places with high air pollution. Where to find more information Ask your health care provider about the risks and benefits of screening. More information and resources are available from these organizations:  [place] (ACS): www.cancer.org  American Lung Association: www.lung.org Contact a health care provider if:  You start to show symptoms of lung cancer, including: ? Coughing  that will not go away. ? Wheezing. ? Chest pain. ? Coughing up blood. ? Shortness of breath. ? Weight loss that cannot be explained. ? Constant fatigue. Summary  Lung cancer screening may find lung cancer before symptoms appear. Finding cancer early improves the chances of successful treatment. It may save your life.  If you are at risk for lung cancer, it is recommended that you are screened once a year. The recommended screening test is a low-dose CT scan.  You can make lifestyle changes to lower your risk of lung cancer.  Ask your health care provider about the risks and benefits of screening. This information is not intended to replace advice given to you by your health care provider. Make sure you discuss any questions you have with your health care provider. Document Released: 05/23/2016 Document Revised: 10/24/2018 Document Reviewed: 05/23/2016 Elsevier Patient Education  2020 [employer].

## 2019-05-09 LAB — COMPLETE METABOLIC PANEL WITH GFR
AG Ratio: 1.8 (calc) (ref 1.0–2.5)
ALT: 10 U/L (ref 6–29)
AST: 16 U/L (ref 10–35)
Albumin: 4.2 g/dL (ref 3.6–5.1)
Alkaline phosphatase (APISO): 68 U/L (ref 37–153)
BUN: 16 mg/dL (ref 7–25)
CO2: 27 mmol/L (ref 20–32)
Calcium: 9.4 mg/dL (ref 8.6–10.4)
Chloride: 104 mmol/L (ref 98–110)
Creat: 0.71 mg/dL (ref 0.50–0.99)
GFR, Est African American: 101 mL/min/{1.73_m2} (ref >=60)
GFR, Est Non African American: 88 mL/min/{1.73_m2} (ref >=60)
Globulin: 2.4 g/dL (calc) (ref 1.9–3.7)
Glucose, Bld: 97 mg/dL (ref 65–99)
Potassium: 4.9 mmol/L (ref 3.5–5.3)
Sodium: 143 mmol/L (ref 135–146)
Total Bilirubin: 0.4 mg/dL (ref 0.2–1.2)
Total Protein: 6.6 g/dL (ref 6.1–8.1)

## 2019-05-09 LAB — CBC WITH DIFFERENTIAL/PLATELET
Absolute Monocytes: 476 cells/uL (ref 200–950)
Basophils Absolute: 41 cells/uL (ref 0–200)
Basophils Relative: 0.6 %
Eosinophils Absolute: 88 cells/uL (ref 15–500)
Eosinophils Relative: 1.3 %
HCT: 40.2 % (ref 35.0–45.0)
Hemoglobin: 13.4 g/dL (ref 11.7–15.5)
Lymphs Abs: 2618 cells/uL (ref 850–3900)
MCH: 27.6 pg (ref 27.0–33.0)
MCHC: 33.3 g/dL (ref 32.0–36.0)
MCV: 82.9 fL (ref 80.0–100.0)
MPV: 10.5 fL (ref 7.5–12.5)
Monocytes Relative: 7 %
Neutro Abs: 3577 cells/uL (ref 1500–7800)
Neutrophils Relative %: 52.6 %
Platelets: 264 10*3/uL (ref 140–400)
RBC: 4.85 10*6/uL (ref 3.80–5.10)
RDW: 13.5 % (ref 11.0–15.0)
Total Lymphocyte: 38.5 %
WBC: 6.8 10*3/uL (ref 3.8–10.8)

## 2019-05-09 LAB — TSH: TSH: 0.64 mIU/L (ref 0.40–4.50)

## 2019-05-09 LAB — LIPID PANEL
Cholesterol: 185 mg/dL (ref <200)
HDL: 85 mg/dL (ref >=50)
LDL Cholesterol (Calc): 83 mg/dL (calc)
Non-HDL Cholesterol (Calc): 100 mg/dL (calc) (ref <130)
Total CHOL/HDL Ratio: 2.2 (calc) (ref <5.0)
Triglycerides: 77 mg/dL (ref <150)

## 2019-05-09 LAB — MAGNESIUM: Magnesium: 1.8 mg/dL (ref 1.5–2.5)

## 2019-05-21 ENCOUNTER — Other Ambulatory Visit: Payer: Self-pay

## 2019-05-21 DIAGNOSIS — Z20822 Contact with and (suspected) exposure to covid-19: Secondary | ICD-10-CM

## 2019-05-23 LAB — NOVEL CORONAVIRUS, NAA: SARS-CoV-2, NAA: NOT DETECTED

## 2019-05-27 ENCOUNTER — Ambulatory Visit: Payer: PPO

## 2019-06-02 ENCOUNTER — Encounter: Payer: Self-pay | Admitting: Adult Health

## 2019-06-02 ENCOUNTER — Ambulatory Visit
Admission: RE | Admit: 2019-06-02 | Discharge: 2019-06-02 | Disposition: A | Discharge status: Home / Self Care | Payer: PPO | Source: Ambulatory Visit | Attending: Adult Health | Admitting: Adult Health

## 2019-06-02 ENCOUNTER — Other Ambulatory Visit: Payer: Self-pay

## 2019-06-02 DIAGNOSIS — F17200 Nicotine dependence, unspecified, uncomplicated: Secondary | ICD-10-CM

## 2019-06-02 DIAGNOSIS — R911 Solitary pulmonary nodule: Secondary | ICD-10-CM

## 2019-06-02 DIAGNOSIS — F17210 Nicotine dependence, cigarettes, uncomplicated: Secondary | ICD-10-CM | POA: Diagnosis not present

## 2019-06-02 DIAGNOSIS — Z122 Encounter for screening for malignant neoplasm of respiratory organs: Secondary | ICD-10-CM

## 2019-06-02 HISTORY — DX: Solitary pulmonary nodule: R91.1

## 2019-07-13 ENCOUNTER — Telehealth: Payer: Self-pay

## 2019-07-13 NOTE — Telephone Encounter (Signed)
Bisoprolol 10mg  tablet is making her breath smell and has a bad taste in her mouth. Please advise.

## 2019-07-14 NOTE — Telephone Encounter (Signed)
Left message on voice mail  to call back

## 2019-07-26 ENCOUNTER — Other Ambulatory Visit: Payer: Self-pay | Admitting: Adult Health

## 2019-07-26 ENCOUNTER — Other Ambulatory Visit: Payer: Self-pay | Admitting: Internal Medicine

## 2019-07-26 DIAGNOSIS — I10 Essential (primary) hypertension: Secondary | ICD-10-CM

## 2019-07-26 MED ORDER — MELOXICAM 15 MG PO TABS: ORAL_TABLET | ORAL | 3 refills | Status: DC

## 2019-07-29 ENCOUNTER — Other Ambulatory Visit: Payer: Self-pay | Admitting: Cardiology

## 2019-07-29 ENCOUNTER — Ambulatory Visit: Payer: Self-pay | Admitting: Adult Health

## 2019-07-29 DIAGNOSIS — I10 Essential (primary) hypertension: Secondary | ICD-10-CM

## 2019-07-29 NOTE — Telephone Encounter (Signed)
This is Dr. Christopher's pt 

## 2019-07-31 ENCOUNTER — Other Ambulatory Visit: Payer: Self-pay

## 2019-07-31 DIAGNOSIS — Z1211 Encounter for screening for malignant neoplasm of colon: Secondary | ICD-10-CM

## 2019-07-31 DIAGNOSIS — Z1212 Encounter for screening for malignant neoplasm of rectum: Secondary | ICD-10-CM

## 2019-07-31 LAB — POC HEMOCCULT BLD/STL (HOME/3-CARD/SCREEN)
Card #2 Fecal Occult Blod, POC: NEGATIVE
Card #3 Fecal Occult Blood, POC: NEGATIVE
Fecal Occult Blood, POC: NEGATIVE

## 2019-08-03 DIAGNOSIS — Z1212 Encounter for screening for malignant neoplasm of rectum: Secondary | ICD-10-CM | POA: Diagnosis not present

## 2019-08-03 DIAGNOSIS — Z1211 Encounter for screening for malignant neoplasm of colon: Secondary | ICD-10-CM | POA: Diagnosis not present

## 2019-08-06 NOTE — Progress Notes (Deleted)
MEDICARE ANNUAL WELLNESS VISIT AND FOLLOW UP  Assessment:   Diagnoses and all orders for this visit:  Encounter for Medicare annual wellness exam  COPD 0/Centrilobular emphysema Stop smoking, followed by pulm  OSA on CPAP - continue CPAP, CPAP is helping with daytime fatigue, weight loss still advised.   Essential hypertension Continue medications Monitor blood pressure at home; call if consistently over 130/80 Continue DASH diet.   Reminder to go to the ER if any CP, SOB, nausea, dizziness, severe HA, changes vision/speech, left arm numbness and tingling and jaw pain.  Aortic atherosclerosis by Abd CT scan [facility]) Per CT 05/2019 Control blood pressure, cholesterol, glucose, increase exercise.   Gastroesophageal reflux disease, esophagitis presence not specified Increase omeprazole to 40 mg in AM x 2 weeks, add famotidine 20-40 mg in the evening if needed, call back if not improving in 2-3 weeks can refer back to GI - known hiatal hernia Discussed diet, avoiding triggers and other lifestyle changes  Vitamin D deficiency Continue supplementation for goal 70-100 Check vitamin D level  Other abnormal glucose Recent A1Cs at goal Discussed diet/exercise, weight management  Defer A1C; check CMP  Medication management CBC, BMP/GFR, LFTs  Hyperlipidemia, mixed Continue medications: newly on praluent Continue low cholesterol diet and exercise.  Check lipid panel, TSH   Chronic cough/smokers cough Seen by ENT/pulm; smokers cough+ allergies/GERD  Stop smoking, continue PPI, H2i  Smoker  Discussed risks associated with tobacco use and advised to reduce or quit Patient is not ready to do so, but advised to consider strongly Will follow up at the next visit Doing annual low dose CT; negative 05/2019  Anxiety/insomnia Recently improved, rare use of benzo  good sleep hygiene discussed, increase day time activity  Over C0 minutes of exam, counseling, chart review and  critical decision making was performed Future Appointments  Date Time Provider [place]  08/10/2019 11:15 AM Liane Comber, NP [facility] None  11/09/2019 10:30 AM Unk Pinto, MD [facility] None  02/17/2020  9:00 AM Unk Pinto, MD [facility] None     Plan:   During the course of the visit the patient was educated and counseled about appropriate screening and preventive services including:    Pneumococcal vaccine   Prevnar 13  Influenza vaccine  Td vaccine  Screening electrocardiogram  Bone densitometry screening  Colorectal cancer screening  Diabetes screening  Glaucoma screening  Nutrition counseling   Advanced directives: requested   Subjective:  [name] is a 69 y.o. female who presents for Medicare Annual Wellness Visit and 3 month follow up.   She had sleep study in 2019 and newly diagnosed OSA on CPAP, she endorses every other night use, 4-5 hours per night. She does feel more energized in the AM when she does wear, but she tends to toss and turn and dislodges and doesn't bother to put back on. ***  she has a diagnosis of anxiety and is currently on clonazepam 0.25 - 0.5 mg, reports symptoms are fairly controlled on current regimen. she reports use is highly variable, recently hasn't used in weeks. Last filled 03/2019.   she has a diagnosis of GERD which is currently managed by omeprazole 40 mg daily, carafate PRN; had recent EGD in 04/2019 which was unremarkable excepting inflammation. *** meloxicam she reports symptoms is currently well controlled, and denies breakthrough reflux, burning in chest; she does have multifactorial chronic hoarseness and smoker's cough.   She is a smoker, currently smoking 3/4 pack daily.  She reports ongoing hoarseness  that is worse in recent years. She is trying to cut down on smoking. She plans to cut back, on wellbutrin and this does help.  Estimated 35 pack/year history. She had CT lung cancer  screening 05/2019 which was negative.   BMI is There is no height or weight on file to calculate BMI., she has been working on diet and exercise. Goes to water aerobics 3 days a week for an hour each visit.  Wt Readings from Last 3 Encounters:  05/08/19 165 lb (74.8 kg)  05/06/19 166 lb (75.3 kg)  04/21/19 166 lb (75.3 kg)    She follows with Dr. Stanford Breed; Carotid Dopplers July 2013 showed less than 50% bilateral stenosis.Echocardiogram September 2018 showed normal LV function, trace aortic insufficiency and mild left atrial enlargement. Stress echocardiogram October 2018 showed no stress-induced wall motion abnormalities.She has CAD and aortic atherosclerosis by CT 05/2019.   Her blood pressure has been controlled at home, today their BP is   She does workout. She denies chest pain, shortness of breath, dizziness.   She is on cholesterol medication (she is on praluent injectible via cardiology, had allergic reaction repatha) and denies myalgias. Her cholesterol is at goal. The cholesterol last visit was:   Lab Results  Component Value Date   CHOL 185 05/08/2019   HDL 85 05/08/2019   LDLCALC 83 05/08/2019   TRIG 77 05/08/2019   CHOLHDL 2.2 05/08/2019    She has been working on diet and exercise for glucose management, and denies foot ulcerations, increased appetite, nausea, paresthesia of the feet, polydipsia, polyuria, visual disturbances, vomiting and weight loss. Last A1C in the office was:  Lab Results  Component Value Date   HGBA1C 5.2 02/04/2019   Last GFR: Lab Results  Component Value Date   GFRNONAA 88 05/08/2019   Patient is on Vitamin D supplement and at recent check:   Lab Results  Component Value Date   VD25OH 71 02/04/2019      Medication Review: Current Outpatient Medications on File Prior to Visit  Medication Sig Dispense Refill  . Alirocumab (PRALUENT) 75 MG/ML SOAJ Inject 75 mg into the skin every 14 (fourteen) days. 2 pen 11  . amLODipine (NORVASC) 2.5 MG  tablet Take 1 tablet (2.5 mg total) by mouth 2 (two) times daily. 60 tablet 5  . Ascorbic Acid (VITAMIN C) 1000 MG tablet Take 1,000 mg by mouth daily.    Marland Kitchen aspirin 81 MG tablet Take 81 mg by mouth every other day.     . bisoprolol-hydrochlorothiazide (ZIAC) 10-6.25 MG tablet Take 0.5 tablets by mouth daily. 45 tablet 3  . Cholecalciferol (VITAMIN D3) 5000 UNITS TABS Take 5,000 Units by mouth daily.    . clonazePAM (KLONOPIN) 0.5 MG tablet TAKE 1/2 TO 1 TABLET BY MOUTH AT BEDTIME FOR SLEEP 30 tablet 0  . Cyanocobalamin (VITAMIN B12 PO) Take 1,000 mcg by mouth daily.    Marland Kitchen dextromethorphan-guaiFENesin (MUCINEX DM) 30-600 MG 12hr tablet Take 1 tablet by mouth as needed.     . fluticasone (FLONASE) 50 MCG/ACT nasal spray 1 Spray each nostril daily (Patient taking differently: as needed. 1 Spray each nostril daily) 48 g 1  . losartan (COZAAR) 100 MG tablet Take 1 tablet Daily for BP 90 tablet 3  . meloxicam (MOBIC) 15 MG tablet Take 1/2 to 1 tablet Daily with Food for Pain & Inflammation 90 tablet 3  . methylcellulose oral powder Take by mouth daily.     . montelukast (SINGULAIR) 10 MG tablet  TAKE 1 TABLET(10 MG) BY MOUTH DAILY 90 tablet 3  . Multiple Vitamins-Minerals (MULTI COMPLETE PO) Take 1 capsule by mouth daily.    Marland Kitchen NIFEdipine (PROCARDIA XL/NIFEDICAL XL) 60 MG 24 hr tablet TAKE 1 TABLET BY MOUTH EVERY NIGHT FOR BLOOD PRESSURE 90 tablet 1  . Omega-3 Fatty Acids (FISH OIL) 1000 MG CAPS Take 1 capsule by mouth daily.    Marland Kitchen omeprazole (PRILOSEC) 40 MG capsule Take 1 capsule daily for Indigestion & Heartburn 90 capsule 3  . sucralfate (CARAFATE) 1 g tablet TAKE 1 TABLET(1 GRAM) BY MOUTH FOUR TIMES DAILY 360 tablet 0  . Tiotropium Bromide Monohydrate (SPIRIVA RESPIMAT) 1.25 MCG/ACT AERS Inhale 2 Inhalers into the lungs daily.    . Zinc 50 MG TABS Take by mouth daily.     No current facility-administered medications on file prior to visit.    Allergies  Allergen Reactions  . Enalapril Swelling     Caused lips to swell and bloating.  Maryelizabeth Kaufmann [Evolocumab]     Current Problems (verified) Patient Active Problem List   Diagnosis Date Noted  . Smokers' cough ([place]) 07/24/2018  . OSA (obstructive sleep apnea) 02/26/2018  . Chronic cough 12/12/2017  . COPD GOLD 0  12/02/2017  . Anxiety 10/22/2017  . Current smoker 10/22/2017  . Aortic atherosclerosis by Abd CT scan ([place]) 05/16/2017  . GERD (gastroesophageal reflux disease) 04/10/2017  . Other abnormal glucose 03/20/2014  . Medication management 03/20/2014  . Essential hypertension 06/15/2013  . Hyperlipidemia, mixed 06/15/2013  . Vitamin D deficiency 06/15/2013    Screening Tests Immunization History  Administered Date(s) Administered  . Influenza, High Dose Seasonal PF 04/10/2017, 05/08/2019  . Influenza-Unspecified 03/05/2018  . PPD Test 09/21/2014  . Pneumococcal Conjugate-13 10/22/2017  . Pneumococcal Polysaccharide-23 02/07/2011, 01/26/2016  . Tdap 02/07/2011   Preventative care: Last colonoscopy: 2015, 10 year follow up EGD: 2020 Mammogram:  05/2017, at Obgyn, will schedule follow up *** Pelvic exam: at OBGYN, never abnormal, [facility], last 2018 Dexa: obgyn has been handling ***  CT chest smoker: 05/2019  Prior vaccinations: TD or Tdap: 2012  Influenza: 04/2019  Pneumococcal: 2012, 2017 Prevnar13: 2019 Shingrix: will check with insurance ***  Names of Other Physician/Practitioners you currently use: 1. [facility] here for primary care 2. [facility], eye doctor, last visit 2018 3. Dr. Purcell [facility], dentist, last visit 2019, goes q68m  Patient Care Team: Unk Pinto, MD as PCP - General (Internal Medicine) Lelon Perla, MD as Consulting Physician (Cardiology)  SURGICAL HISTORY She  has a past surgical history that includes Appendectomy; Partial hysterectomy; Lumbar fusion (02/06/2012); Back surgery; Dilation and curettage of uterus; Total  hip arthroplasty (06/02/2012); Total knee arthroplasty (Right, 12/15/2012); Total hip arthroplasty (Left, 01/25/2016); Total hip arthroplasty (Left, 01/25/2016); Total knee arthroplasty (Left, 06/18/2016); Colonoscopy; Upper gastrointestinal endoscopy (05/06/2019); and Polypectomy. FAMILY HISTORY Her family history includes CAD in her father; Cancer in her maternal grandmother; Colon cancer (age of onset: 42) in her sister; Colon cancer (age of onset: 38) in her mother; Diabetes in her mother; Hypertension in her brother, brother, and father; Lung cancer (age of onset: 69) in her brother; Peripheral Artery Disease in her paternal grandfather. SOCIAL HISTORY She  reports that she has been smoking cigarettes. She started smoking about 36 years ago. She has a 35.00 pack-year smoking history. She has never used smokeless tobacco. She reports current alcohol use. She reports current drug use. Drug: Marijuana.   MEDICARE WELLNESS OBJECTIVES: Physical activity:  Cardiac risk factors:   Depression/mood screen:   Depression screen [facility] 2/9 02/03/2019  Decreased Interest 1  Down, Depressed, Hopeless 1  PHQ - 2 Score 2    ADLs:  In your present state of health, do you have any difficulty performing the following activities: 02/03/2019  Hearing? N  Vision? N  Difficulty concentrating or making decisions? N  Walking or climbing stairs? N  Dressing or bathing? N  Doing errands, shopping? N  Some recent data might be hidden     Cognitive Testing  Alert? Yes  Normal Appearance?Yes  Oriented to person? Yes  Place? Yes   Time? Yes  Recall of three objects?  Yes  Can perform simple calculations? Yes  Displays appropriate judgment?Yes  Can read the correct time from a watch face?Yes  EOL planning:    Review of Systems  Constitutional: Negative for malaise/fatigue and weight loss.  HENT: Negative for congestion, hearing loss, sore throat and tinnitus.   Eyes: Negative for blurred vision and double  vision.  Respiratory: Positive for cough and sputum production. Negative for shortness of breath and wheezing.   Cardiovascular: Negative for chest pain, palpitations, orthopnea, claudication, leg swelling and PND.  Gastrointestinal: Positive for heartburn. Negative for abdominal pain, blood in stool, constipation, diarrhea, melena, nausea and vomiting.  Genitourinary: Negative.   Musculoskeletal: Negative for falls, joint pain and myalgias.  Skin: Negative for rash.  Neurological: Negative for dizziness, tingling, sensory change, weakness and headaches.  Endo/Heme/Allergies: Negative for environmental allergies and polydipsia.  Psychiatric/Behavioral: Negative.  Negative for depression, memory loss, substance abuse and suicidal ideas. The patient is not nervous/anxious and does not have insomnia.   All other systems reviewed and are negative.    Objective:     There were no vitals filed for this visit. There is no height or weight on file to calculate BMI.  General appearance: alert, no distress, WD/WN, female HEENT: normocephalic, sclerae anicteric, TMs pearly, nares patent, no discharge or erythema, pharynx normal Oral cavity: MMM, no lesions Neck: supple, no lymphadenopathy, no thyromegaly, no masses Heart: RRR, normal S1, S2, no murmurs Lungs: Scattered rhonchi to bilateral bases without wheezes or rales Abdomen: +bs, soft, non tender, non distended, no masses, no hepatomegaly, no splenomegaly Musculoskeletal: nontender, no swelling, no obvious deformity Extremities: no edema, no cyanosis, no clubbing Pulses: 2+ symmetric, upper and lower extremities, normal cap refill Neurological: alert, oriented x 3, CN2-12 intact, strength normal upper extremities and lower extremities, sensation normal throughout, DTRs 2+ throughout, no cerebellar signs, gait normal Psychiatric: normal affect, behavior normal, pleasant   Medicare Attestation I have personally reviewed: The patient's  medical and social history Their use of alcohol, tobacco or illicit drugs Their current medications and supplements The patient's functional ability including ADLs,fall risks, home safety risks, cognitive, and hearing and visual impairment Diet and physical activities Evidence for depression or mood disorders  The patient's weight, height, BMI, and visual acuity have been recorded in the chart.  I have made referrals, counseling, and provided education to the patient based on review of the above and I have provided the patient with a written personalized care plan for preventive services.     Izora Ribas, NP   08/06/2019

## 2019-08-07 ENCOUNTER — Other Ambulatory Visit: Payer: Self-pay | Admitting: Cardiology

## 2019-08-07 MED ORDER — BISOPROLOL-HYDROCHLOROTHIAZIDE 10-6.25 MG PO TABS: 0.5000 | ORAL_TABLET | Freq: Every day | ORAL | 3 refills | Status: DC

## 2019-08-07 NOTE — Telephone Encounter (Signed)
*  STAT* If patient is at the pharmacy, call can be transferred to refill team.   1. Which medications need to be refilled? (please list name of each medication and dose if known) bisoprolol-hydrochlorothiazide (ZIAC) 10-6.25 MG tablet  2. Which pharmacy/location (including street and city if local pharmacy) is medication to be sent to? [facility] [place]  3. Do they need a 30 day or 90 day supply? [place]

## 2019-08-07 NOTE — Telephone Encounter (Signed)
Rx request sent to pharmacy.  

## 2019-08-10 ENCOUNTER — Other Ambulatory Visit: Payer: Self-pay

## 2019-08-10 ENCOUNTER — Ambulatory Visit (INDEPENDENT_AMBULATORY_CARE_PROVIDER_SITE_OTHER): Payer: PPO | Admitting: Adult Health

## 2019-08-10 ENCOUNTER — Encounter: Payer: Self-pay | Admitting: Adult Health

## 2019-08-10 ENCOUNTER — Ambulatory Visit: Payer: Self-pay | Admitting: Adult Health

## 2019-08-10 VITALS — BP 110/60 | HR 62 | Temp 97.0°F | Ht 65.0 in | Wt 170.6 lb

## 2019-08-10 DIAGNOSIS — I10 Essential (primary) hypertension: Secondary | ICD-10-CM | POA: Diagnosis not present

## 2019-08-10 DIAGNOSIS — J432 Centrilobular emphysema: Secondary | ICD-10-CM

## 2019-08-10 DIAGNOSIS — Z0001 Encounter for general adult medical examination with abnormal findings: Secondary | ICD-10-CM | POA: Diagnosis not present

## 2019-08-10 DIAGNOSIS — F17200 Nicotine dependence, unspecified, uncomplicated: Secondary | ICD-10-CM | POA: Diagnosis not present

## 2019-08-10 DIAGNOSIS — R05 Cough: Secondary | ICD-10-CM

## 2019-08-10 DIAGNOSIS — F419 Anxiety disorder, unspecified: Secondary | ICD-10-CM

## 2019-08-10 DIAGNOSIS — E782 Mixed hyperlipidemia: Secondary | ICD-10-CM | POA: Diagnosis not present

## 2019-08-10 DIAGNOSIS — E559 Vitamin D deficiency, unspecified: Secondary | ICD-10-CM

## 2019-08-10 DIAGNOSIS — I700 Atherosclerosis of aorta: Secondary | ICD-10-CM

## 2019-08-10 DIAGNOSIS — R6889 Other general symptoms and signs: Secondary | ICD-10-CM | POA: Diagnosis not present

## 2019-08-10 DIAGNOSIS — J410 Simple chronic bronchitis: Secondary | ICD-10-CM

## 2019-08-10 DIAGNOSIS — K219 Gastro-esophageal reflux disease without esophagitis: Secondary | ICD-10-CM

## 2019-08-10 DIAGNOSIS — G4733 Obstructive sleep apnea (adult) (pediatric): Secondary | ICD-10-CM | POA: Diagnosis not present

## 2019-08-10 DIAGNOSIS — Z79899 Other long term (current) drug therapy: Secondary | ICD-10-CM | POA: Diagnosis not present

## 2019-08-10 DIAGNOSIS — Z0000 Encounter for general adult medical examination without abnormal findings: Secondary | ICD-10-CM

## 2019-08-10 DIAGNOSIS — R7309 Other abnormal glucose: Secondary | ICD-10-CM | POA: Diagnosis not present

## 2019-08-10 DIAGNOSIS — R053 Chronic cough: Secondary | ICD-10-CM

## 2019-08-10 NOTE — Addendum Note (Signed)
Addended by: Izora Ribas on: 08/10/2019 03:21 PM   Modules accepted: Orders

## 2019-08-10 NOTE — Patient Instructions (Addendum)
[name] , Thank you for taking time to come for your Medicare Wellness Visit. I appreciate your ongoing commitment to your health goals. Please review the following plan we discussed and let me know if I can assist you in the future.   These are the goals we discussed: Goals    . Exercise 150 min/wk Moderate Activity    . LDL CALC < 70    . Quit Smoking    . Weight (lb) < 150 lb (68 kg)       This is a list of the screening recommended for you and due dates:  Health Maintenance  Topic Date Due  . Mammogram  05/25/2018  . Tetanus Vaccine  02/06/2021  . Colon Cancer Screening  02/09/2024  . Flu Shot  Completed  . DEXA scan (bone density measurement)  Completed  .  Hepatitis C: One time screening is recommended by Center for Disease Control  (CDC) for  adults born from [birth year] through 1965.   Completed  . Pneumonia vaccines  Completed     Tumeric supplement with black pepper fruit/bioprene Or tart cherry supplement  Daily natual antiinflammatory to take instead of meloxicam Tylenol if fine       Exercising to Lose Weight Exercise is structured, repetitive physical activity to improve fitness and health. Getting regular exercise is important for everyone. It is especially important if you are overweight. Being overweight increases your risk of heart disease, stroke, diabetes, high blood pressure, and several types of cancer. Reducing your calorie intake and exercising can help you lose weight. Exercise is usually categorized as moderate or vigorous intensity. To lose weight, most people need to do a certain amount of moderate-intensity or vigorous-intensity exercise each week. Moderate-intensity exercise  Moderate-intensity exercise is any activity that gets you moving enough to burn at least three times more energy (calories) than if you were sitting. Examples of moderate exercise include:  Walking a mile in 15 minutes.  Doing light yard work.  Biking at an easy  pace. Most people should get at least 150 minutes (2 hours and 30 minutes) a week of moderate-intensity exercise to maintain their body weight. Vigorous-intensity exercise Vigorous-intensity exercise is any activity that gets you moving enough to burn at least six times more calories than if you were sitting. When you exercise at this intensity, you should be working hard enough that you are not able to carry on a conversation. Examples of vigorous exercise include:  Running.  Playing a team sport, such as football, basketball, and soccer.  Jumping rope. Most people should get at least 75 minutes (1 hour and 15 minutes) a week of vigorous-intensity exercise to maintain their body weight. How can exercise affect me? When you exercise enough to burn more calories than you eat, you lose weight. Exercise also reduces body fat and builds muscle. The more muscle you have, the more calories you burn. Exercise also:  Improves mood.  Reduces stress and tension.  Improves your overall fitness, flexibility, and endurance.  Increases bone strength. The amount of exercise you need to lose weight depends on:  Your age.  The type of exercise.  Any health conditions you have.  Your overall physical ability. Talk to your health care provider about how much exercise you need and what types of activities are safe for you. What actions can I take to lose weight? Nutrition   Make changes to your diet as told by your health care provider or diet and nutrition specialist (  dietitian). This may include: ? Eating fewer calories. ? Eating more protein. ? Eating less unhealthy fats. ? Eating a diet that includes fresh fruits and vegetables, whole grains, low-fat dairy products, and lean protein. ? Avoiding foods with added fat, salt, and sugar.  Drink plenty of water while you exercise to prevent dehydration or heat stroke. Activity  Choose an activity that you enjoy and set realistic goals. Your  health care provider can help you make an exercise plan that works for you.  Exercise at a moderate or vigorous intensity most days of the week. ? The intensity of exercise may vary from person to person. You can tell how intense a workout is for you by paying attention to your breathing and heartbeat. Most people will notice their breathing and heartbeat get faster with more intense exercise.  Do resistance training twice each week, such as: ? Push-ups. ? Sit-ups. ? Lifting weights. ? Using resistance bands.  Getting short amounts of exercise can be just as helpful as long structured periods of exercise. If you have trouble finding time to exercise, try to include exercise in your daily routine. ? Get up, stretch, and walk around every 30 minutes throughout the day. ? Go for a walk during your lunch break. ? Park your car farther away from your destination. ? If you take public transportation, get off one stop early and walk the rest of the way. ? Make phone calls while standing up and walking around. ? Take the stairs instead of elevators or escalators.  Wear comfortable clothes and shoes with good support.  Do not exercise so much that you hurt yourself, feel dizzy, or get very short of breath. Where to find more information  U.S. Department of Health and Human Services: [employer]at  Centers for Disease Control and Prevention (CDC): http://www.wolf.info/ Contact a health care provider:  Before starting a new exercise program.  If you have questions or concerns about your weight.  If you have a medical problem that keeps you from exercising. Get help right away if you have any of the following while exercising:  Injury.  Dizziness.  Difficulty breathing or shortness of breath that does not go away when you stop exercising.  Chest pain.  Rapid heartbeat. Summary  Being overweight increases your risk of heart disease, stroke, diabetes, high blood pressure, and several types of  cancer.  Losing weight happens when you burn more calories than you eat.  Reducing the amount of calories you eat in addition to getting regular moderate or vigorous exercise each week helps you lose weight. This information is not intended to replace advice given to you by your health care provider. Make sure you discuss any questions you have with your health care provider. Document Revised: 07/15/2017 Document Reviewed: 07/15/2017 Elsevier Patient Education  2020 [employer].

## 2019-08-10 NOTE — Progress Notes (Signed)
MEDICARE ANNUAL WELLNESS VISIT AND FOLLOW UP  Assessment:   Diagnoses and all orders for this visit:  Encounter for Medicare annual wellness exam  COPD 0/Centrilobular emphysema Stop smoking, followed by pulm  OSA on CPAP - continue CPAP, CPAP is helping with daytime fatigue, weight loss still advised.   Essential hypertension Continue medications Monitor blood pressure at home; call if consistently over 130/80 Continue DASH diet.   Reminder to go to the ER if any CP, SOB, nausea, dizziness, severe HA, changes vision/speech, left arm numbness and tingling and jaw pain.  Aortic atherosclerosis by Abd CT scan [facility]) Per CT 05/2019 Control blood pressure, cholesterol, glucose, increase exercise.   Gastroesophageal reflux disease, esophagitis presence not specified Well controlled with Omeprazole and PRN carafate; advised STOP NSAID Discussed diet, avoiding triggers and other lifestyle changes  Vitamin D deficiency Continue supplementation for goal 70-100 Check vitamin D level  Other abnormal glucose Recent A1Cs at goal Discussed diet/exercise, weight management  Defer A1C; check CMP  Medication management CBC, CMP/GFR, magnesium   Hyperlipidemia, mixed Continue medications: newly on praluent Continue low cholesterol diet and exercise.  Check lipid panel, TSH   Chronic cough/smokers cough Seen by ENT/pulm; smokers cough+ allergies/GERD  Stop smoking, continue PPI, H2i  Smoker  Discussed risks associated with tobacco use and advised to reduce or quit Patient is not ready to do so, but advised to consider strongly Will follow up at the next visit Doing annual low dose CT; negative 05/2019  Anxiety/insomnia Recently improved, rare use of benzo  good sleep hygiene discussed, increase day time activity  Over C0 minutes of exam, counseling, chart review and critical decision making was performed Future Appointments  Date Time Provider [place]  08/25/2019   9:20 AM Lelon Perla, MD [facility]  11/09/2019 10:30 AM Unk Pinto, MD [facility] None  02/17/2020  9:00 AM Unk Pinto, MD [facility] None     Plan:   During the course of the visit the patient was educated and counseled about appropriate screening and preventive services including:    Pneumococcal vaccine   Prevnar 13  Influenza vaccine  Td vaccine  Screening electrocardiogram  Bone densitometry screening  Colorectal cancer screening  Diabetes screening  Glaucoma screening  Nutrition counseling   Advanced directives: requested   Subjective:  [name] is a 69 y.o. female who presents for Medicare Annual Wellness Visit and 3 month follow up.   She had sleep study in 2019 and newly diagnosed OSA on CPAP, she endorses every other night use, 4-5 hours per night. She does feel more energized in the AM when she does wear, but she tends to toss and turn and dislodges and doesn't bother to put back on, will follow up with [place].   she has a diagnosis of anxiety and is currently on clonazepam 0.25 - 0.5 mg, reports symptoms are fairly controlled on current regimen. she reports use is highly variable, recently hasn't used in weeks. Last filled 03/2019.   she has a diagnosis of GERD which is currently managed by omeprazole 40 mg daily, carafate PRN; had recent EGD in 04/2019 which was unremarkable excepting inflammation.  she reports symptoms is currently well controlled, and denies breakthrough reflux, burning in chest; she does have multifactorial chronic hoarseness and smoker's cough.   She is a smoker, currently smoking 3/4 pack daily.  She reports ongoing hoarseness that is worse in recent years. She is trying to cut down on smoking. She plans to cut back.  Estimated 35 pack/year history. She had CT lung cancer screening 05/2019 which was negative.   BMI is Body mass index is 28.39 kg/m., she has been working on diet and exercise, exercise  limited by pandemic, [facility]  Wt Readings from Last 3 Encounters:  08/10/19 170 lb 9.6 oz (77.4 kg)  05/08/19 165 lb (74.8 kg)  05/06/19 166 lb (75.3 kg)   She follows with Dr. Stanford Breed; Carotid Dopplers July 2013 showed less than 50% bilateral stenosis.Echocardiogram September 2018 showed normal LV function, trace aortic insufficiency and mild left atrial enlargement. Stress echocardiogram October 2018 showed no stress-induced wall motion abnormalities.She has CAD and aortic atherosclerosis by CT 05/2019.   Her blood pressure has been controlled at home, today their BP is BP: 110/60 She does not workout. She denies chest pain, shortness of breath, dizziness.   She is on cholesterol medication (she is on praluent injectible via cardiology, had allergic reaction repatha) and denies myalgias. Her cholesterol is at goal. The cholesterol last visit was:   Lab Results  Component Value Date   CHOL 185 05/08/2019   HDL 85 05/08/2019   LDLCALC 83 05/08/2019   TRIG 77 05/08/2019   CHOLHDL 2.2 05/08/2019    She has been working on diet and exercise for glucose management, and denies foot ulcerations, increased appetite, nausea, paresthesia of the feet, polydipsia, polyuria, visual disturbances, vomiting and weight loss. Last A1C in the office was:  Lab Results  Component Value Date   HGBA1C 5.2 02/04/2019   Last GFR: Lab Results  Component Value Date   GFRNONAA 88 05/08/2019   Patient is on Vitamin D supplement and at recent check:   Lab Results  Component Value Date   VD25OH 71 02/04/2019      Medication Review: Current Outpatient Medications on File Prior to Visit  Medication Sig Dispense Refill  . Alirocumab (PRALUENT) 75 MG/ML SOAJ Inject 75 mg into the skin every 14 (fourteen) days. 2 pen 11  . amLODipine (NORVASC) 2.5 MG tablet Take 1 tablet (2.5 mg total) by mouth 2 (two) times daily. 60 tablet 5  . Ascorbic Acid (VITAMIN C) 1000 MG tablet  Take 1,000 mg by mouth daily.    Marland Kitchen aspirin 81 MG tablet Take 81 mg by mouth every other day.     . bisoprolol-hydrochlorothiazide (ZIAC) 10-6.25 MG tablet Take 0.5 tablets by mouth daily. 45 tablet 3  . Cholecalciferol (VITAMIN D3) 5000 UNITS TABS Take 5,000 Units by mouth daily.    . clonazePAM (KLONOPIN) 0.5 MG tablet TAKE 1/2 TO 1 TABLET BY MOUTH AT BEDTIME FOR SLEEP 30 tablet 0  . Cyanocobalamin (VITAMIN B12 PO) Take 1,000 mcg by mouth daily.    Marland Kitchen dextromethorphan-guaiFENesin (MUCINEX DM) 30-600 MG 12hr tablet Take 1 tablet by mouth as needed.     . fluticasone (FLONASE) 50 MCG/ACT nasal spray 1 Spray each nostril daily (Patient taking differently: as needed. 1 Spray each nostril daily) 48 g 1  . losartan (COZAAR) 100 MG tablet Take 1 tablet Daily for BP 90 tablet 3  . meloxicam (MOBIC) 15 MG tablet Take 1/2 to 1 tablet Daily with Food for Pain & Inflammation 90 tablet 3  . methylcellulose oral powder Take by mouth daily.     . montelukast (SINGULAIR) 10 MG tablet TAKE 1 TABLET(10 MG) BY MOUTH DAILY 90 tablet 3  . Multiple Vitamins-Minerals (MULTI COMPLETE PO) Take 1 capsule by mouth daily.    Marland Kitchen NIFEdipine (  PROCARDIA XL/NIFEDICAL XL) 60 MG 24 hr tablet TAKE 1 TABLET BY MOUTH EVERY NIGHT FOR BLOOD PRESSURE 90 tablet 1  . Omega-3 Fatty Acids (FISH OIL) 1000 MG CAPS Take 1 capsule by mouth daily.    Marland Kitchen omeprazole (PRILOSEC) 40 MG capsule Take 1 capsule daily for Indigestion & Heartburn 90 capsule 3  . sucralfate (CARAFATE) 1 g tablet TAKE 1 TABLET(1 GRAM) BY MOUTH FOUR TIMES DAILY 360 tablet 0  . Tiotropium Bromide Monohydrate (SPIRIVA RESPIMAT) 1.25 MCG/ACT AERS Inhale 2 Inhalers into the lungs daily.    . Zinc 50 MG TABS Take by mouth daily.     No current facility-administered medications on file prior to visit.    Allergies  Allergen Reactions  . Enalapril Swelling    Caused lips to swell and bloating.  Maryelizabeth Kaufmann [Evolocumab]     Current Problems (verified) Patient Active Problem  List   Diagnosis Date Noted  . Smokers' cough ([place]) 07/24/2018  . OSA (obstructive sleep apnea) 02/26/2018  . Chronic cough 12/12/2017  . COPD GOLD 0  12/02/2017  . Anxiety 10/22/2017  . Current smoker 10/22/2017  . Aortic atherosclerosis by Abd CT scan ([place]) 05/16/2017  . GERD (gastroesophageal reflux disease) 04/10/2017  . Other abnormal glucose 03/20/2014  . Medication management 03/20/2014  . Essential hypertension 06/15/2013  . Hyperlipidemia, mixed 06/15/2013  . Vitamin D deficiency 06/15/2013    Screening Tests Immunization History  Administered Date(s) Administered  . Influenza, High Dose Seasonal PF 04/10/2017, 05/08/2019  . Influenza-Unspecified 03/05/2018  . PPD Test 09/21/2014  . Pneumococcal Conjugate-13 10/22/2017  . Pneumococcal Polysaccharide-23 02/07/2011, 01/26/2016  . Tdap 02/07/2011   Preventative care: Last colonoscopy: 2015, 10 year follow up EGD: 2020 Mammogram:  10/2018, at Obgyn, reports requested Pelvic exam: at OBGYN, never abnormal, [facility], last 10/2018 Dexa: obgyn has been handling, had 10/2018 normal per patient   CT chest smoker: 05/2019  Prior vaccinations: TD or Tdap: 2012  Influenza: 04/2019  Pneumococcal: 2012, 2017 Prevnar13: 2019 Shingrix: declines due to cost   Names of Other Physician/Practitioners you currently use: 1. [facility] here for primary care 2. [facility], eye doctor, last visit 2020, has mild R cataract 3. Dr. Purcell Nails, dentist, last visit 2019, goes q52m  Patient Care Team: Unk Pinto, MD as PCP - General (Internal Medicine) Lelon Perla, MD as Consulting Physician (Cardiology)  SURGICAL HISTORY She  has a past surgical history that includes Appendectomy; Partial hysterectomy; Lumbar fusion (02/06/2012); Back surgery; Dilation and curettage of uterus; Total hip arthroplasty (06/02/2012); Total knee arthroplasty (Right, 12/15/2012); Total hip  arthroplasty (Left, 01/25/2016); Total hip arthroplasty (Left, 01/25/2016); Total knee arthroplasty (Left, 06/18/2016); Colonoscopy; Upper gastrointestinal endoscopy (05/06/2019); Polypectomy; and Cataract extraction (Left, 2019). FAMILY HISTORY Her family history includes CAD in her father; Cancer in her maternal grandmother; Colon cancer (age of onset: 75) in her sister; Colon cancer (age of onset: 29) in her mother; Diabetes in her mother; Hypertension in her brother, brother, and father; Lung cancer (age of onset: 23) in her brother; Peripheral Artery Disease in her paternal grandfather. SOCIAL HISTORY She  reports that she has been smoking cigarettes. She started smoking about 36 years ago. She has a 35.00 pack-year smoking history. She has never used smokeless tobacco. She reports current alcohol use. She reports current drug use. Drug: Marijuana.   MEDICARE WELLNESS OBJECTIVES: Physical activity: Current Exercise Habits: Home exercise routine, Type of exercise: walking, Exercise limited by: None identified Cardiac risk factors:  Cardiac Risk Factors include: advanced age (>58men, >62 women);sedentary lifestyle;dyslipidemia;hypertension;smoking/ tobacco exposure Depression/mood screen:   Depression screen [facility] 2/9 08/10/2019  Decreased Interest 0  Down, Depressed, Hopeless 0  PHQ - 2 Score 0    ADLs:  In your present state of health, do you have any difficulty performing the following activities: 08/10/2019 02/03/2019  Hearing? N N  Vision? N N  Difficulty concentrating or making decisions? N N  Walking or climbing stairs? N N  Dressing or bathing? N N  Doing errands, shopping? N N  Some recent data might be hidden     Cognitive Testing  Alert? Yes  Normal Appearance?Yes  Oriented to person? Yes  Place? Yes   Time? Yes  Recall of three objects?  Yes  Can perform simple calculations? Yes  Displays appropriate judgment?Yes  Can read the correct time from a watch face?Yes  EOL  planning: Does Patient Have a Medical Advance Directive?: No Would patient like information on creating a medical advance directive?: Yes (MAU/Ambulatory/Procedural Areas - Information given)  Review of Systems  Constitutional: Negative for malaise/fatigue and weight loss.  HENT: Negative for congestion, hearing loss, sore throat and tinnitus.   Eyes: Negative for blurred vision and double vision.  Respiratory: Positive for cough (chronic) and sputum production. Negative for shortness of breath and wheezing.   Cardiovascular: Negative for chest pain, palpitations, orthopnea, claudication, leg swelling and PND.  Gastrointestinal: Negative for abdominal pain, blood in stool, constipation, diarrhea, heartburn, melena, nausea and vomiting.  Genitourinary: Negative.   Musculoskeletal: Negative for falls, joint pain and myalgias.  Skin: Negative for rash.  Neurological: Negative for dizziness, tingling, sensory change, weakness and headaches.  Endo/Heme/Allergies: Negative for environmental allergies and polydipsia.  Psychiatric/Behavioral: Negative.  Negative for depression, memory loss, substance abuse and suicidal ideas. The patient is not nervous/anxious and does not have insomnia.   All other systems reviewed and are negative.    Objective:     Today's Vitals   08/10/19 1429  BP: 110/60  Pulse: 62  Temp: (!) 97 F (36.1 C)  SpO2: 96%  Weight: 170 lb 9.6 oz (77.4 kg)  Height: 5\' 5"  (1.651 m)   Body mass index is 28.39 kg/m.  General appearance: alert, no distress, WD/WN, female HEENT: normocephalic, sclerae anicteric, TMs pearly, nares patent, no discharge or erythema, pharynx normal Oral cavity: MMM, no lesions Neck: supple, no lymphadenopathy, no thyromegaly, no masses Heart: RRR, normal S1, S2, no murmurs Lungs: Scattered rhonchi to bilateral bases without wheezes or rales Abdomen: +bs, soft, non tender, non distended, no masses, no hepatomegaly, no  splenomegaly Musculoskeletal: nontender, no swelling, no obvious deformity Extremities: no edema, no cyanosis, no clubbing Pulses: 2+ symmetric, upper and lower extremities, normal cap refill Neurological: alert, oriented x 3, CN2-12 intact, strength normal upper extremities and lower extremities, sensation normal throughout, DTRs 2+ throughout, no cerebellar signs, gait normal Psychiatric: normal affect, behavior normal, pleasant   Medicare Attestation I have personally reviewed: The patient's medical and social history Their use of alcohol, tobacco or illicit drugs Their current medications and supplements The patient's functional ability including ADLs,fall risks, home safety risks, cognitive, and hearing and visual impairment Diet and physical activities Evidence for depression or mood disorders  The patient's weight, height, BMI, and visual acuity have been recorded in the chart.  I have made referrals, counseling, and provided education to the patient based on review of the above and I have provided the patient with a written personalized care plan for  preventive services.     Izora Ribas, NP   08/10/2019

## 2019-08-11 ENCOUNTER — Other Ambulatory Visit: Payer: Self-pay | Admitting: Adult Health

## 2019-08-11 ENCOUNTER — Telehealth: Payer: Self-pay | Admitting: Pharmacist

## 2019-08-11 DIAGNOSIS — E782 Mixed hyperlipidemia: Secondary | ICD-10-CM

## 2019-08-11 LAB — COMPLETE METABOLIC PANEL WITH GFR
AG Ratio: 1.9 (calc) (ref 1.0–2.5)
ALT: 11 U/L (ref 6–29)
AST: 17 U/L (ref 10–35)
Albumin: 4.3 g/dL (ref 3.6–5.1)
Alkaline phosphatase (APISO): 71 U/L (ref 37–153)
BUN: 12 mg/dL (ref 7–25)
CO2: 30 mmol/L (ref 20–32)
Calcium: 9.3 mg/dL (ref 8.6–10.4)
Chloride: 100 mmol/L (ref 98–110)
Creat: 0.66 mg/dL (ref 0.50–0.99)
GFR, Est African American: 105 mL/min/{1.73_m2} (ref >=60)
GFR, Est Non African American: 91 mL/min/{1.73_m2} (ref >=60)
Globulin: 2.3 g/dL (calc) (ref 1.9–3.7)
Glucose, Bld: 85 mg/dL (ref 65–99)
Potassium: 4.5 mmol/L (ref 3.5–5.3)
Sodium: 137 mmol/L (ref 135–146)
Total Bilirubin: 0.4 mg/dL (ref 0.2–1.2)
Total Protein: 6.6 g/dL (ref 6.1–8.1)

## 2019-08-11 LAB — CBC WITH DIFFERENTIAL/PLATELET
Absolute Monocytes: 536 cells/uL (ref 200–950)
Basophils Absolute: 40 cells/uL (ref 0–200)
Basophils Relative: 0.6 %
Eosinophils Absolute: 181 cells/uL (ref 15–500)
Eosinophils Relative: 2.7 %
HCT: 39.6 % (ref 35.0–45.0)
Hemoglobin: 13.3 g/dL (ref 11.7–15.5)
Lymphs Abs: 2760 cells/uL (ref 850–3900)
MCH: 27.5 pg (ref 27.0–33.0)
MCHC: 33.6 g/dL (ref 32.0–36.0)
MCV: 81.8 fL (ref 80.0–100.0)
MPV: 10.2 fL (ref 7.5–12.5)
Monocytes Relative: 8 %
Neutro Abs: 3183 cells/uL (ref 1500–7800)
Neutrophils Relative %: 47.5 %
Platelets: 276 10*3/uL (ref 140–400)
RBC: 4.84 10*6/uL (ref 3.80–5.10)
RDW: 13.3 % (ref 11.0–15.0)
Total Lymphocyte: 41.2 %
WBC: 6.7 10*3/uL (ref 3.8–10.8)

## 2019-08-11 LAB — LIPID PANEL
Cholesterol: 195 mg/dL (ref <200)
HDL: 78 mg/dL (ref >=50)
LDL Cholesterol (Calc): 87 mg/dL (calc)
Non-HDL Cholesterol (Calc): 117 mg/dL (calc) (ref <130)
Total CHOL/HDL Ratio: 2.5 (calc) (ref <5.0)
Triglycerides: 207 mg/dL — ABNORMAL HIGH (ref <150)

## 2019-08-11 LAB — MAGNESIUM: Magnesium: 1.9 mg/dL (ref 1.5–2.5)

## 2019-08-11 LAB — TSH: TSH: 1.41 mIU/L (ref 0.40–4.50)

## 2019-08-11 MED ORDER — PRALUENT 150 MG/ML ~~LOC~~ SOAJ: 150.0000 mg | SUBCUTANEOUS | 11 refills | Status: DC

## 2019-08-11 MED ORDER — FISH OIL 1000 MG PO CAPS: 1.0000 | ORAL_CAPSULE | Freq: Two times a day (BID) | ORAL | 11 refills | Status: AC

## 2019-08-11 NOTE — Telephone Encounter (Signed)
Patient doing well with Praluent. Will increase dose from 75mg  to 150mg  every 14 days. Also intrusted to decrease starchy foods and increase Fish oil to 1gm twice daily.  Plan to repeat fasting lipid panel in 4-6 weeks.

## 2019-08-18 NOTE — Progress Notes (Signed)
HPI: FU CP. Carotid Dopplers July 2013 showed less than 50% bilateral stenosis.Echocardiogram September 2018 showed normal LV function, trace aortic insufficiency and mild left atrial enlargement. Stress echocardiogram October 2018 showed no stress-induced wall motion abnormalities. Chest CT November 2020 showed coronary artery calcification and aortic atherosclerosis as well as emphysema.  Since last seen,  she denies chest pain or syncope.  She has some dyspnea on exertion which she attributes to tobacco use.  Current Outpatient Medications  Medication Sig Dispense Refill  . Alirocumab (PRALUENT) 150 MG/ML SOAJ Inject 150 mg into the skin every 14 (fourteen) days. 2 pen 11  . amLODipine (NORVASC) 2.5 MG tablet Take 1 tablet (2.5 mg total) by mouth 2 (two) times daily. 60 tablet 5  . Ascorbic Acid (VITAMIN C) 1000 MG tablet Take 1,000 mg by mouth daily.    Marland Kitchen aspirin 81 MG tablet Take 81 mg by mouth every other day.     . bisoprolol-hydrochlorothiazide (ZIAC) 10-6.25 MG tablet Take 0.5 tablets by mouth daily. 45 tablet 3  . Cholecalciferol (VITAMIN D3) 5000 UNITS TABS Take 5,000 Units by mouth daily.    . clonazePAM (KLONOPIN) 0.5 MG tablet TAKE 1/2 TO 1 TABLET BY MOUTH AT BEDTIME FOR SLEEP 30 tablet 0  . Cyanocobalamin (VITAMIN B12 PO) Take 1,000 mcg by mouth daily.    Marland Kitchen dextromethorphan-guaiFENesin (MUCINEX DM) 30-600 MG 12hr tablet Take 1 tablet by mouth as needed.     . fluticasone (FLONASE) 50 MCG/ACT nasal spray 1 Spray each nostril daily (Patient taking differently: as needed. 1 Spray each nostril daily) 48 g 1  . losartan (COZAAR) 100 MG tablet Take 1 tablet Daily for BP 90 tablet 3  . meloxicam (MOBIC) 15 MG tablet Take 1/2 to 1 tablet Daily with Food for Pain & Inflammation 90 tablet 3  . methylcellulose oral powder Take by mouth daily.     . montelukast (SINGULAIR) 10 MG tablet TAKE 1 TABLET(10 MG) BY MOUTH DAILY 90 tablet 3  . Multiple Vitamins-Minerals (MULTI COMPLETE PO)  Take 1 capsule by mouth daily.    Marland Kitchen NIFEdipine (PROCARDIA XL/NIFEDICAL XL) 60 MG 24 hr tablet TAKE 1 TABLET BY MOUTH EVERY NIGHT FOR BLOOD PRESSURE 90 tablet 1  . Omega-3 Fatty Acids (FISH OIL) 1000 MG CAPS Take 1 capsule (1,000 mg total) by mouth 2 (two) times daily with a meal. 60 capsule 11  . omeprazole (PRILOSEC) 40 MG capsule Take 1 capsule daily for Indigestion & Heartburn 90 capsule 3  . sucralfate (CARAFATE) 1 g tablet TAKE 1 TABLET(1 GRAM) BY MOUTH FOUR TIMES DAILY 360 tablet 0  . Tiotropium Bromide Monohydrate (SPIRIVA RESPIMAT) 1.25 MCG/ACT AERS Inhale 2 Inhalers into the lungs daily.    . Zinc 50 MG TABS Take by mouth daily.     No current facility-administered medications for this visit.     Past Medical History:  Diagnosis Date  . Allergy    enviromental  . Anxiety   . Arthritis    osteoarthritis  . COPD (chronic obstructive pulmonary disease) (HCC)    mild  . Diabetes mellitus without complication ([place])    controlled by diet  . Environmental allergies   . GERD (gastroesophageal reflux disease)   . Hepatitis    hepatitis A  . History of colon polyps   . Hyperlipidemia   . Hypertension   . IBS (irritable bowel syndrome)   . Mixed hyperlipidemia 06/15/2013  . PONV (postoperative nausea and vomiting)    second  surgery no problems last surgery  . Solitary pulmonary nodule 06/02/2019  . Urgency of urination    Takes oxybutynin; under control at this time    Past Surgical History:  Procedure Laterality Date  . APPENDECTOMY     removed as child  . BACK SURGERY     lumbar fusion  . CATARACT EXTRACTION Left 2019  . COLONOSCOPY    . DILATION AND CURETTAGE OF UTERUS    . LUMBAR FUSION  02/06/2012   L 4  L5  . PARTIAL HYSTERECTOMY    . POLYPECTOMY    . TOTAL HIP ARTHROPLASTY  06/02/2012   Procedure: TOTAL HIP ARTHROPLASTY;  Surgeon: Kerin Salen, MD;  Location: [facility];  Service: Orthopedics;  Laterality: Right;  . TOTAL HIP ARTHROPLASTY Left 01/25/2016  . TOTAL  HIP ARTHROPLASTY Left 01/25/2016   Procedure: LEFT TOTAL HIP ARTHROPLASTY;  Surgeon: Frederik Pear, MD;  Location: [facility];  Service: Orthopedics;  Laterality: Left;  . TOTAL KNEE ARTHROPLASTY Right 12/15/2012   Procedure: TOTAL KNEE ARTHROPLASTY;  Surgeon: Kerin Salen, MD;  Location: [facility];  Service: Orthopedics;  Laterality: Right;  . TOTAL KNEE ARTHROPLASTY Left 06/18/2016   Procedure: TOTAL KNEE ARTHROPLASTY;  Surgeon: Frederik Pear, MD;  Location: [facility];  Service: Orthopedics;  Laterality: Left;  . UPPER GASTROINTESTINAL ENDOSCOPY  05/06/2019    Social History   Socioeconomic History  . Marital status: Married    Spouse name: Not on file  . Number of children: 2  . Years of education: Not on file  . Highest education level: Not on file  Occupational History  . Not on file  Tobacco Use  . Smoking status: Current Every Day Smoker    Packs/day: 1.00    Years: 35.00    Pack years: 35.00    Types: Cigarettes    Start date: 77  . Smokeless tobacco: Never Used  . Tobacco comment: Currenlty smoking 0.75 packs/day 05/08/2019  Substance and Sexual Activity  . Alcohol use: Yes    Comment: 2-4 beers per night, advised to cut back   . Drug use: Yes    Types: Marijuana    Comment: current, 5 joints/month   . Sexual activity: Yes    Birth control/protection: Surgical  Other Topics Concern  . Not on file  Social History Narrative  . Not on file   Social Determinants of Health   Financial Resource Strain:   . Difficulty of Paying Living Expenses: Not on file  Food Insecurity:   . Worried About Charity fundraiser in the Last Year: Not on file  . Ran Out of Food in the Last Year: Not on file  Transportation Needs:   . Lack of Transportation (Medical): Not on file  . Lack of Transportation (Non-Medical): Not on file  Physical Activity:   . Days of Exercise per Week: Not on file  . Minutes of Exercise per Session: Not on file  Stress:   . Feeling of Stress : Not on file  Social  Connections:   . Frequency of Communication with Friends and Family: Not on file  . Frequency of Social Gatherings with Friends and Family: Not on file  . Attends Religious Services: Not on file  . Active Member of Clubs or Organizations: Not on file  . Attends Archivist Meetings: Not on file  . Marital Status: Not on file  Intimate Partner Violence:   . Fear of Current or Ex-Partner: Not on file  . Emotionally  Abused: Not on file  . Physically Abused: Not on file  . Sexually Abused: Not on file    Family History  Problem Relation Age of Onset  . Diabetes Mother   . Colon cancer Mother 69  . Hypertension Father   . CAD Father   . Colon cancer Sister 72  . Lung cancer Brother 63  . Hypertension Brother   . Hypertension Brother   . Cancer Maternal Grandmother        Unsure what kind  . Peripheral Artery Disease Paternal Grandfather   . Esophageal cancer Neg Hx   . Rectal cancer Neg Hx   . Stomach cancer Neg Hx     ROS: no fevers or chills, productive cough, hemoptysis, dysphasia, odynophagia, melena, hematochezia, dysuria, hematuria, rash, seizure activity, orthopnea, PND, pedal edema, claudication. Remaining systems are negative.  Physical Exam: Well-developed well-nourished in no acute distress.  Skin is warm and dry.  HEENT is normal.  Neck is supple.  Chest is clear to auscultation with normal expansion.  Cardiovascular exam is regular rate and rhythm.  Abdominal exam nontender or distended. No masses palpated. Extremities show no edema. neuro grossly intact  A/P  1 chest pain-no recurrent symptoms.  Previous functional study negative.  No plans for further evaluation.  2 hypertension-blood pressure elevated; however she has not taken her Procardia as her prescription has run out.  She is on 2 calcium blockers.  We will discontinue amlodipine.  Increase Procardia to 90 mg daily.  Follow blood pressure and adjust regimen as needed.  3  hyperlipidemia-intolerant to statins and Repatha.  Continue Praluent.  4 tobacco abuse-counseled on discontinuing.  5 coronary artery disease-based on calcification noted in coronaries on CT.  Continue Praluent.  Continue aspirin 81 mg daily.  Kirk Ruths, MD

## 2019-08-21 ENCOUNTER — Other Ambulatory Visit: Payer: Self-pay | Admitting: Pulmonary Disease

## 2019-08-21 DIAGNOSIS — J449 Chronic obstructive pulmonary disease, unspecified: Secondary | ICD-10-CM

## 2019-08-25 ENCOUNTER — Other Ambulatory Visit: Payer: Self-pay

## 2019-08-25 ENCOUNTER — Ambulatory Visit: Payer: PPO | Admitting: Cardiology

## 2019-08-25 ENCOUNTER — Encounter: Payer: Self-pay | Admitting: Cardiology

## 2019-08-25 VITALS — BP 162/78 | HR 73 | Ht 65.0 in | Wt 168.4 lb

## 2019-08-25 DIAGNOSIS — I10 Essential (primary) hypertension: Secondary | ICD-10-CM

## 2019-08-25 DIAGNOSIS — Z720 Tobacco use: Secondary | ICD-10-CM | POA: Diagnosis not present

## 2019-08-25 DIAGNOSIS — E7800 Pure hypercholesterolemia, unspecified: Secondary | ICD-10-CM

## 2019-08-25 DIAGNOSIS — I2510 Atherosclerotic heart disease of native coronary artery without angina pectoris: Secondary | ICD-10-CM

## 2019-08-25 MED ORDER — NIFEDIPINE ER 90 MG PO TB24: 90.0000 mg | ORAL_TABLET | Freq: Every day | ORAL | 3 refills | Status: DC

## 2019-08-25 NOTE — Patient Instructions (Signed)
Medication Instructions:  STOP AMLODIPINE  START PROCARDIA XL 90 MG ONCE DAILY  *If you need a refill on your cardiac medications before your next appointment, please call your pharmacy*  Lab Work: If you have labs (blood work) drawn today and your tests are completely normal, you will receive your results only by: Marland Kitchen MyChart Message (if you have MyChart) OR . A paper copy in the mail If you have any lab test that is abnormal or we need to change your treatment, we will call you to review the results.  Follow-Up: At [facility], you and your health needs are our priority.  As part of our continuing mission to provide you with exceptional heart care, we have created designated Provider Care Teams.  These Care Teams include your primary Cardiologist (physician) and Advanced Practice Providers (APPs -  Physician Assistants and Nurse Practitioners) who all work together to provide you with the care you need, when you need it.  Your next appointment:   12 month(s)  The format for your next appointment:   Either In Person or Virtual  Provider:   You may see Kirk Ruths MD or one of the following Advanced Practice Providers on your designated Care Team:    Kerin Ransom, PA-C  [place], [place]  Coletta Memos, [place]

## 2019-08-26 ENCOUNTER — Encounter: Payer: Self-pay | Admitting: *Deleted

## 2019-09-06 NOTE — Progress Notes (Signed)
@Patient  ID: [name], female    DOB: October 05, 1950, 69 y.o.   MRN: [id number]  Chief Complaint  Patient presents with  . Follow-up    44yr for OSA and dyspnea. Stated she ran out of Spiriva completely. Increased SOB since running out.     Referring provider: Unk Pinto, MD  HPI:  69 year old smoker followed in our  Followed in our office for mild obstruction on 11/2017 PFTs.  PMH: HTN, DMT2, Hyperlipidemia, AR Maintenance: Spiriva 1.25 Smoking Status/History:  Current smoker (30 pack years) Pt of: Dr. Lamonte Sakai  09/07/2019  - Visit   69 year old female current everyday smoker followed in our office for COPD and obstructive sleep apnea.  Patient continues to smoke about 1 pack/day.  35-pack-year smoking history.  Patient ran out of her Spiriva.  She is noticed that her shortness of breath has worsened since running out.  She needs a refill of this today.  She also has been noncompliant with using her CPAP.  She only uses her CPAP when she feels that she "needs it".  Since last being seen patient continues to utilize the reduced quit method to work towards stopping smoking.  She is currently only smoking 12 cigarettes a day.  This is an improvement from the 1 pack she was at the last time we saw.  She has utilized many smoking cessation options in the past.  She reports that she tried Chantix and she had night terrors.  She also has tried nicotine replacement therapies the patch she did not find success with this.  She is open to discussing smoking cessation as well as other interventions..  She is currently not using her CPAP.  She would like to discuss this today.  She reports that she has tried 4 different mask but still does not sleep well with it.  She would like to further discuss her diagnosis of obstructive sleep apnea and what the long-term effects would be if this was left untreated.   Questionaires / Pulmonary Flowsheets:   MMRC: mMRC Dyspnea Scale mMRC Score    09/07/2019 0    [facility]:  Results of the [facility]worth flowsheet 02/11/2018  Sitting and reading 3  Watching TV 3  Sitting, inactive in a public place (e.g. a theatre or a meeting) 2  As a passenger in a car for an hour without a break 3  Lying down to rest in the afternoon when circumstances permit 1  Sitting and talking to someone 0  Sitting quietly after a lunch without alcohol 0  In a car, while stopped for a few minutes in traffic 0  Total score 12    Tests:   12/11/2017-pulmonary function test- post bronc ratio 77, FEV1 95, no significant bronchodilator response DLCO 71  02/23/2018- Home sleep study results- AHI 12.5 an hour, SaO2 low of 79% with an average of 87%.  Imaging:  11/18/2017-chest x-ray- COPD changes with minimal left basilar atelectasis, mild hyperinflation  Cardiac:  04/03/2017-echocardiogram-LV ejection fraction 55 to 60%   FENO:  No results found for: NITRICOXIDE  PFT: PFT Results Latest Ref Rng & Units 12/11/2017  FVC-Pre L 3.30  FVC-Predicted Pre % 102  FVC-Post L 3.12  FVC-Predicted Post % 97  Pre FEV1/FVC % % 71  Post FEV1/FCV % % 79  FEV1-Pre L 2.34  FEV1-Predicted Pre % 95  FEV1-Post L 2.45  DLCO UNC% % 71  DLCO COR %Predicted % 74  TLC L 6.50  TLC % Predicted % 125  RV %  Predicted % 127    WALK:  No flowsheet data found.  Imaging: No results found.  Lab Results:  CBC    Component Value Date/Time   WBC 6.7 08/10/2019 1521   RBC 4.84 08/10/2019 1521   HGB 13.3 08/10/2019 1521   HCT 39.6 08/10/2019 1521   PLT 276 08/10/2019 1521   MCV 81.8 08/10/2019 1521   MCH 27.5 08/10/2019 1521   MCHC 33.6 08/10/2019 1521   RDW 13.3 08/10/2019 1521   LYMPHSABS 2,760 08/10/2019 1521   MONOABS 496 12/18/2016 1225   EOSABS 181 08/10/2019 1521   BASOSABS 40 08/10/2019 1521    BMET    Component Value Date/Time   NA 137 08/10/2019 1521   K 4.5 08/10/2019 1521   CL 100 08/10/2019 1521   CO2 30 08/10/2019 1521   GLUCOSE 85 08/10/2019 1521    BUN 12 08/10/2019 1521   CREATININE 0.66 08/10/2019 1521   CALCIUM 9.3 08/10/2019 1521   GFRNONAA 91 08/10/2019 1521   GFRAA 105 08/10/2019 1521    BNP No results found for: BNP  ProBNP No results found for: PROBNP  Specialty Problems      Pulmonary Problems   COPD GOLD 0     12/11/2017-pulmonary function test- post bronch ratio 77, FEV1 95, no significant bronchodilator response DLCO 71  Moderate emphysematous changes on imaging      Chronic cough   OSA (obstructive sleep apnea)    02/23/2018- Home sleep study-AHI 12.5 an hour lowest SaO2 was 79% with an average of 87%         Allergies  Allergen Reactions  . Enalapril Swelling    Caused lips to swell and bloating.  Maryelizabeth Kaufmann [Evolocumab]     Immunization History  Administered Date(s) Administered  . Influenza, High Dose Seasonal PF 04/10/2017, 05/08/2019  . Influenza-Unspecified 03/05/2018  . PPD Test 09/21/2014  . Pneumococcal Conjugate-13 10/22/2017  . Pneumococcal Polysaccharide-23 02/07/2011, 01/26/2016  . Tdap 02/07/2011    Past Medical History:  Diagnosis Date  . Allergy    enviromental  . Anxiety   . Arthritis    osteoarthritis  . COPD (chronic obstructive pulmonary disease) (HCC)    mild  . Diabetes mellitus without complication ([place])    controlled by diet  . Environmental allergies   . GERD (gastroesophageal reflux disease)   . Hepatitis    hepatitis A  . History of colon polyps   . Hyperlipidemia   . Hypertension   . IBS (irritable bowel syndrome)   . Mixed hyperlipidemia 06/15/2013  . PONV (postoperative nausea and vomiting)    second surgery no problems last surgery  . Solitary pulmonary nodule 06/02/2019  . Urgency of urination    Takes oxybutynin; under control at this time    Tobacco History: Social History   Tobacco Use  Smoking Status Current Every Day Smoker  . Packs/day: 1.00  . Years: 35.00  . Pack years: 35.00  . Types: Cigarettes  . Start date: 1985  Smokeless  Tobacco Never Used  Tobacco Comment   Currenlty smoking 0.75 packs/day 05/08/2019   Ready to quit: Yes Counseling given: Yes Comment: Currenlty smoking 0.75 packs/day 05/08/2019  Smoking assessment and cessation counseling  Patient currently smoking: 12 cigarettes a day  I have advised the patient to quit/stop smoking as soon as possible due to high risk for multiple medical problems.  It will also be very difficult for [place] to manage patient's  respiratory symptoms and status if we continue to  expose her lungs to a known irritant.  We do not advise e-cigarettes as a form of stopping smoking.  Patient is willing to quit smoking. Using reduce to quit method.   I have advised the patient that we can assist and have options of nicotine replacement therapy, provided smoking cessation education today, provided smoking cessation counseling, and provided cessation resources.  Follow-up next office visit office visit for assessment of smoking cessation.  Smoking cessation counseling advised for: 5 min    Outpatient Encounter Medications as of 09/07/2019  Medication Sig  . Alirocumab (PRALUENT) 150 MG/ML SOAJ Inject 150 mg into the skin every 14 (fourteen) days.  . Ascorbic Acid (VITAMIN C) 1000 MG tablet Take 1,000 mg by mouth daily.  Marland Kitchen aspirin 81 MG tablet Take 81 mg by mouth every other day.   . bisoprolol-hydrochlorothiazide (ZIAC) 10-6.25 MG tablet Take 0.5 tablets by mouth daily.  . Cholecalciferol (VITAMIN D3) 5000 UNITS TABS Take 5,000 Units by mouth daily.  . clonazePAM (KLONOPIN) 0.5 MG tablet TAKE 1/2 TO 1 TABLET BY MOUTH AT BEDTIME FOR SLEEP  . Cyanocobalamin (VITAMIN B12 PO) Take 1,000 mcg by mouth daily.  Marland Kitchen dextromethorphan-guaiFENesin (MUCINEX DM) 30-600 MG 12hr tablet Take 1 tablet by mouth as needed.   . fluticasone (FLONASE) 50 MCG/ACT nasal spray 1 Spray each nostril daily (Patient taking differently: as needed. 1 Spray each nostril daily)  . losartan (COZAAR) 100 MG tablet  Take 1 tablet Daily for BP  . methylcellulose oral powder Take by mouth daily.   . montelukast (SINGULAIR) 10 MG tablet TAKE 1 TABLET(10 MG) BY MOUTH DAILY  . Multiple Vitamins-Minerals (MULTI COMPLETE PO) Take 1 capsule by mouth daily.  Marland Kitchen NIFEdipine (ADALAT CC) 90 MG 24 hr tablet Take 1 tablet (90 mg total) by mouth daily.  . Omega-3 Fatty Acids (FISH OIL) 1000 MG CAPS Take 1 capsule (1,000 mg total) by mouth 2 (two) times daily with a meal.  . omeprazole (PRILOSEC) 40 MG capsule Take 1 capsule daily for Indigestion & Heartburn  . sucralfate (CARAFATE) 1 g tablet TAKE 1 TABLET(1 GRAM) BY MOUTH FOUR TIMES DAILY  . Tiotropium Bromide Monohydrate (SPIRIVA RESPIMAT) 1.25 MCG/ACT AERS Inhale 2 Inhalers into the lungs daily.  . Zinc 50 MG TABS Take by mouth daily.  . Tiotropium Bromide Monohydrate (SPIRIVA RESPIMAT) 1.25 MCG/ACT AERS Inhale 2 puffs into the lungs daily.  . Tiotropium Bromide Monohydrate (SPIRIVA RESPIMAT) 1.25 MCG/ACT AERS Inhale 2 puffs into the lungs daily.  . [DISCONTINUED] meloxicam (MOBIC) 15 MG tablet Take 1/2 to 1 tablet Daily with Food for Pain & Inflammation   No facility-administered encounter medications on file as of 09/07/2019.     Review of Systems  Review of Systems  Constitutional: Negative for activity change, fatigue and fever.  HENT: Negative for sinus pressure, sinus pain and sore throat.   Respiratory: Positive for shortness of breath. Negative for cough and wheezing.   Cardiovascular: Negative for chest pain and palpitations.  Gastrointestinal: Negative for diarrhea, nausea and vomiting.  Musculoskeletal: Negative for arthralgias.  Neurological: Negative for dizziness.  Psychiatric/Behavioral: Negative for sleep disturbance. The patient is not nervous/anxious.      Physical Exam  BP 120/70 (BP Location: Left Arm, Patient Position: Sitting, Cuff Size: Normal)   Pulse 70   Temp (!) 97.3 F (36.3 C) (Temporal)   Ht 5\' 5"  (1.651 m)   Wt 167 lb 12.8  oz (76.1 kg)   SpO2 97%   BMI 27.92 kg/m   Wt  Readings from Last 5 Encounters:  09/07/19 167 lb 12.8 oz (76.1 kg)  08/25/19 168 lb 6.4 oz (76.4 kg)  08/10/19 170 lb 9.6 oz (77.4 kg)  05/08/19 165 lb (74.8 kg)  05/06/19 166 lb (75.3 kg)    BMI Readings from Last 5 Encounters:  09/07/19 27.92 kg/m  08/25/19 28.02 kg/m  08/10/19 28.39 kg/m  05/08/19 27.46 kg/m  05/06/19 27.62 kg/m     Physical Exam Vitals and nursing note reviewed.  Constitutional:      General: She is not in acute distress.    Appearance: Normal appearance. She is normal weight.  HENT:     Head: Normocephalic and atraumatic.     Right Ear: Tympanic membrane, ear canal and external ear normal. There is no impacted cerumen.     Left Ear: Tympanic membrane, ear canal and external ear normal. There is no impacted cerumen.     Nose: Rhinorrhea present. No congestion.     Mouth/Throat:     Mouth: Mucous membranes are moist.     Pharynx: Oropharynx is clear.  Eyes:     Pupils: Pupils are equal, round, and reactive to light.  Cardiovascular:     Rate and Rhythm: Normal rate and regular rhythm.     Pulses: Normal pulses.     Heart sounds: Normal heart sounds. No murmur.  Pulmonary:     Effort: Pulmonary effort is normal. No respiratory distress.     Breath sounds: Normal breath sounds. No decreased air movement. No decreased breath sounds, wheezing or rales.  Musculoskeletal:     Cervical back: Normal range of motion.  Skin:    General: Skin is warm and dry.     Capillary Refill: Capillary refill takes less than 2 seconds.  Neurological:     General: No focal deficit present.     Mental Status: She is alert and oriented to person, place, and time. Mental status is at baseline.     Gait: Gait normal.  Psychiatric:        Mood and Affect: Mood normal.        Behavior: Behavior normal.        Thought Content: Thought content normal.        Judgment: Judgment normal.       Assessment & Plan:    COPD GOLD 0  Plan:  Continue Spiriva Respimat 1.25 Please work on stopping smoking We will coordinate a clinical pharmacy team visit to work on smoking cessation Follow-up in 4 months    OSA (obstructive sleep apnea) Reviewed obstructive sleep apnea with the patient Emphasized that patient has mild obstructive sleep apnea Explained that untreated obstructive sleep apnea can have detrimental effects to her cardiac health as well as chronic disease management  Plan: Patient to resume using CPAP therapy Follow-up in 4 months  Current smoker Patient is used Chantix in the past had night terrors Patient is currently using reduced to quit method Patient is used nicotine patch before She has never used nicotine replacement therapies both short and long-acting  Plan: Emphasized need for the patient to stop smoking Praised patient on her progress so far We will coordinate clinical pharmacy team follow-up in 4 weeks to work on smoking cessation She may be a great candidate for nicotine patch as well as nicotine lozenges or gum Referral to lung cancer screening program in our office for further coordination of CTs Next lung cancer screening CT is due in November/2021     Return in about  4 months (around 01/05/2020), or if symptoms worsen or fail to improve, for Follow up with Dr. Lamonte Sakai, Follow up with Wyn Quaker FNP-C.   Lauraine Rinne, NP 09/07/2019   This appointment required 32 minutes of patient care (this includes precharting, chart review, review of results, face-to-face care, etc.).

## 2019-09-07 ENCOUNTER — Other Ambulatory Visit: Payer: Self-pay

## 2019-09-07 ENCOUNTER — Ambulatory Visit: Payer: PPO | Admitting: Pulmonary Disease

## 2019-09-07 ENCOUNTER — Encounter: Payer: Self-pay | Admitting: Pulmonary Disease

## 2019-09-07 VITALS — BP 120/70 | HR 70 | Temp 97.3°F | Ht 65.0 in | Wt 167.8 lb

## 2019-09-07 DIAGNOSIS — G4733 Obstructive sleep apnea (adult) (pediatric): Secondary | ICD-10-CM

## 2019-09-07 DIAGNOSIS — F17200 Nicotine dependence, unspecified, uncomplicated: Secondary | ICD-10-CM

## 2019-09-07 DIAGNOSIS — J432 Centrilobular emphysema: Secondary | ICD-10-CM

## 2019-09-07 MED ORDER — SPIRIVA RESPIMAT 1.25 MCG/ACT IN AERS: 2.0000 | INHALATION_SPRAY | Freq: Every day | RESPIRATORY_TRACT | 5 refills | Status: DC

## 2019-09-07 MED ORDER — SPIRIVA RESPIMAT 1.25 MCG/ACT IN AERS: 2.0000 | INHALATION_SPRAY | Freq: Every day | RESPIRATORY_TRACT | 0 refills | Status: DC

## 2019-09-07 NOTE — Assessment & Plan Note (Addendum)
Patient is used Chantix in the past had night terrors Patient is currently using reduced to quit method Patient is used nicotine patch before She has never used nicotine replacement therapies both short and long-acting  Plan: Emphasized need for the patient to stop smoking Praised patient on her progress so far We will coordinate clinical pharmacy team follow-up in 4 weeks to work on smoking cessation She may be a great candidate for nicotine patch as well as nicotine lozenges or gum Referral to lung cancer screening program in our office for further coordination of CTs Next lung cancer screening CT is due in November/2021

## 2019-09-07 NOTE — Assessment & Plan Note (Signed)
Reviewed obstructive sleep apnea with the patient Emphasized that patient has mild obstructive sleep apnea Explained that untreated obstructive sleep apnea can have detrimental effects to her cardiac health as well as chronic disease management  Plan: Patient to resume using CPAP therapy Follow-up in 4 months

## 2019-09-07 NOTE — Patient Instructions (Addendum)
You were seen today by Lauraine Rinne, NP  for:   1. Centrilobular emphysema (HCC)  Resume Spiriva Respimat 1.25 >>> 2 puffs daily >>> Do this every day >>>This is not a rescue inhaler  Note your daily symptoms > remember "red flags" for COPD:   >>>Increase in cough >>>increase in sputum production >>>increase in shortness of breath or activity  intolerance.   If you notice these symptoms, please call the office to be seen.   Please continue your progress on stopping smoking  2. OSA (obstructive sleep apnea)  We recommend that you RESUME using your CPAP daily >>>Keep up the hard work using your device >>> Goal should be wearing this for the entire night that you are sleeping, at least 4 to 6 hours  Remember:  . Do not drive or operate heavy machinery if tired or drowsy.  . Please notify the supply company and office if you are unable to use your device regularly due to missing supplies or machine being broken.  . Work on maintaining a healthy weight and following your recommended nutrition plan  . Maintain proper daily exercise and movement  . Maintaining proper use of your device can also help improve management of other chronic illnesses such as: Blood pressure, blood sugars, and weight management.   BiPAP/ CPAP Cleaning:  >>>Clean weekly, with Dawn soap, and bottle brush.  Set up to air dry. >>> Wipe mask out daily with wet wipe or towelette   3. Current smoker  We recommend that you stop smoking.  >>>You need to set a quit date >>>If you have friends or family who smoke, let them know you are trying to quit and not to smoke around you or in your living environment  Smoking Cessation Resources:  1 800 QUIT NOW  >>> Patient to call this resource and utilize it to help support her quit smoking >>> Keep up your hard work with stopping smoking  You can also contact the [facility] >>>For smoking cessation classes call [phone]  We do not recommend  using e-cigarettes as a form of stopping smoking  You can sign up for smoking cessation support texts and information:  >>>https://smokefree.gov/smokefreetxt  Please present to our office in 4 weeks for an appointment with the clinical pharmacy team for:  . Smoking cessation    We recommend today:   Meds ordered this encounter  Medications  . Tiotropium Bromide Monohydrate (SPIRIVA RESPIMAT) 1.25 MCG/ACT AERS    Sig: Inhale 2 puffs into the lungs daily.    Dispense:  20 g    Refill:  0    Order Specific Question:   Lot Number?    Answer:   [id number] & [id number]    Order Specific Question:   Expiration Date?    Answer:   04/14/2021  . Tiotropium Bromide Monohydrate (SPIRIVA RESPIMAT) 1.25 MCG/ACT AERS    Sig: Inhale 2 puffs into the lungs daily.    Dispense:  4 g    Refill:  5    Follow Up:    Return in about 4 months (around 01/05/2020), or if symptoms worsen or fail to improve, for Follow up with Dr. Lamonte Sakai, Follow up with Wyn Quaker FNP-C.  Please present to our office in 4 weeks for an appointment with the clinical pharmacy team for:  . Smoking cessation . Medication reconciliation  . Inhaler teaching    Please do your part to reduce the spread of COVID-19:      Reduce  your risk of any infection  and COVID19 by using the similar precautions used for avoiding the common cold or flu:  Marland Kitchen Wash your hands often with soap and warm water for at least 20 seconds.  If soap and water are not readily available, use an alcohol-based hand sanitizer with at least 60% alcohol.  . If coughing or sneezing, cover your mouth and nose by coughing or sneezing into the elbow areas of your shirt or coat, into a tissue or into your sleeve (not your hands). Langley Gauss A MASK when in public  . Avoid shaking hands with others and consider head nods or verbal greetings only. . Avoid touching your eyes, nose, or mouth with unwashed hands.  . Avoid close contact with people who are sick. . Avoid places  or events with large numbers of people in one location, like concerts or sporting events. . If you have some symptoms but not all symptoms, continue to monitor at home and seek medical attention if your symptoms worsen. . If you are having a medical emergency, call 911.   [place] / e-Visit: [email or web address]         [facility]: [facility]: [id number]                   [facility]: [id number]     It is flu season:   >>> Best ways to protect herself from the flu: Receive the yearly flu vaccine, practice good hand hygiene washing with soap and also using hand sanitizer when available, eat a nutritious meals, get adequate rest, hydrate appropriately   Please contact the office if your symptoms worsen or you have concerns that you are not improving.   Thank you for choosing  [facility] for your healthcare, and for allowing [place] to partner with you on your healthcare journey. I am thankful to be able to provide care to you today.   Wyn Quaker FNP-C   Sleep Apnea Sleep apnea affects breathing during sleep. It causes breathing to stop for a short time or to become shallow. It can also increase the risk of:  Heart attack.  Stroke.  Being very overweight (obese).  Diabetes.  Heart failure.  Irregular heartbeat. The goal of treatment is to help you breathe normally again. What are the causes? There are three kinds of sleep apnea:  Obstructive sleep apnea. This is caused by a blocked or collapsed airway.  Central sleep apnea. This happens when the brain does not send the right signals to the muscles that control breathing.  Mixed sleep apnea. This is a combination of obstructive and central sleep apnea. The most common cause of this condition is a collapsed or blocked airway. This can happen if:  Your throat  muscles are too relaxed.  Your tongue and tonsils are too large.  You are overweight.  Your airway is too small. What increases the risk?  Being overweight.  Smoking.  Having a small airway.  Being older.  Being female.  Drinking alcohol.  Taking medicines to calm yourself (sedatives or tranquilizers).  Having family members with the condition. What are the signs or symptoms?  Trouble staying asleep.  Being sleepy or tired during the day.  Getting angry a lot.  Loud snoring.  Headaches in the morning.  Not being able to focus your mind (concentrate).  Forgetting things.  Less interest in sex.  Mood swings.  Personality changes.  Feelings of sadness (depression).  Waking up a lot during the night to pee (urinate).  Dry mouth.  Sore throat. How is this diagnosed?  Your medical history.  A physical exam.  A test that is done when you are sleeping (sleep study). The test is most often done in a sleep lab but may also be done at home. How is this treated?   Sleeping on your side.  Using a medicine to get rid of mucus in your nose (decongestant).  Avoiding the use of alcohol, medicines to help you relax, or certain pain medicines (narcotics).  Losing weight, if needed.  Changing your diet.  Not smoking.  Using a machine to open your airway while you sleep, such as: ? An oral appliance. This is a mouthpiece that shifts your lower jaw forward. ? A CPAP device. This device blows air through a mask when you breathe out (exhale). ? An EPAP device. This has valves that you put in each nostril. ? A BPAP device. This device blows air through a mask when you breathe in (inhale) and breathe out.  Having surgery if other treatments do not work. It is important to get treatment for sleep apnea. Without treatment, it can lead to:  High blood pressure.  Coronary artery disease.  In men, not being able to have an erection (impotence).  Reduced  thinking ability. Follow these instructions at home: Lifestyle  Make changes that your doctor recommends.  Eat a healthy diet.  Lose weight if needed.  Avoid alcohol, medicines to help you relax, and some pain medicines.  Do not use any products that contain nicotine or tobacco, such as cigarettes, e-cigarettes, and chewing tobacco. If you need help quitting, ask your doctor. General instructions  Take over-the-counter and prescription medicines only as told by your doctor.  If you were given a machine to use while you sleep, use it only as told by your doctor.  If you are having surgery, make sure to tell your doctor you have sleep apnea. You may need to bring your device with you.  Keep all follow-up visits as told by your doctor. This is important. Contact a doctor if:  The machine that you were given to use during sleep bothers you or does not seem to be working.  You do not get better.  You get worse. Get help right away if:  Your chest hurts.  You have trouble breathing in enough air.  You have an uncomfortable feeling in your back, arms, or stomach.  You have trouble talking.  One side of your body feels weak.  A part of your face is hanging down. These symptoms may be an emergency. Do not wait to see if the symptoms will go away. Get medical help right away. Call your local emergency services (911 in the [place] Do not drive yourself to the hospital. Summary  This condition affects breathing during sleep.  The most common cause is a collapsed or blocked airway.  The goal of treatment is to help you breathe normally while you sleep. This information is not intended to replace advice given to you by your health care provider. Make sure you discuss any questions you have with your health care provider. Document Revised: 04/18/2018 Document Reviewed: 02/25/2018 Elsevier Patient Education  [place] Risks of Smoking Smoking cigarettes is  very bad for your health. Tobacco smoke has over 200 known poisons in it. It contains the poisonous gases  nitrogen oxide and carbon monoxide. There are over 60 chemicals in tobacco smoke that cause cancer. Smoking is difficult to quit because a chemical in tobacco, called nicotine, causes addiction or dependence. When you smoke and inhale, nicotine is absorbed rapidly into the bloodstream through your lungs. Both inhaled and non-inhaled nicotine may be addictive. What are the risks of cigarette smoke? Cigarette smokers have an increased risk of many serious medical problems, including:  Lung cancer.  Lung disease, such as pneumonia, bronchitis, and emphysema.  Chest pain (angina) and heart attack because the heart is not getting enough oxygen.  Heart disease and peripheral blood vessel disease.  High blood pressure (hypertension).  Stroke.  Oral cancer, including cancer of the lip, mouth, or voice box.  Bladder cancer.  Pancreatic cancer.  Cervical cancer.  Pregnancy complications, including premature birth.  Stillbirths and smaller newborn babies, birth defects, and genetic damage to sperm.  Early menopause.  Lower estrogen level for women.  Infertility.  Facial wrinkles.  Blindness.  Increased risk of broken bones (fractures).  Senile dementia.  Stomach ulcers and internal bleeding.  Delayed wound healing and increased risk of complications during surgery.  Even smoking lightly shortens your life expectancy by several years. Because of secondhand smoke exposure, children of smokers have an increased risk of the following:  Sudden infant death syndrome (SIDS).  Respiratory infections.  Lung cancer.  Heart disease.  Ear infections. What are the benefits of quitting? There are many health benefits of quitting smoking. Here are some of them:  Within days of quitting smoking, your risk of having a heart attack decreases, your blood flow improves, and your  lung capacity improves. Blood pressure, pulse rate, and breathing patterns start returning to normal soon after quitting.  Within months, your lungs may clear up completely.  Quitting for 10 years reduces your risk of developing lung cancer and heart disease to almost that of a nonsmoker.  People who quit may see an improvement in their overall quality of life. How do I quit smoking?     Smoking is an addiction with both physical and psychological effects, and longtime habits can be hard to change. Your health care provider can recommend:  Programs and community resources, which may include group support, education, or talk therapy.  Prescription medicines to help reduce cravings.  Nicotine replacement products, such as patches, gum, and nasal sprays. Use these products only as directed. Do not replace cigarette smoking with electronic cigarettes, which are commonly called e-cigarettes. The safety of e-cigarettes is not known, and some may contain harmful chemicals.  A combination of two or more of these methods. Where to find more information  American Lung Association: www.lung.org  American Cancer Society: www.cancer.org Summary  Smoking cigarettes is very bad for your health. Cigarette smokers have an increased risk of many serious medical problems, including several cancers, heart disease, and stroke.  Smoking is an addiction with both physical and psychological effects, and longtime habits can be hard to change.  By stopping right away, you can greatly reduce the risk of medical problems for you and your family.  To help you quit smoking, your health care provider can recommend programs, community resources, prescription medicines, and nicotine replacement products such as patches, gum, and nasal sprays. This information is not intended to replace advice given to you by your health care provider. Make sure you discuss any questions you have with your health care  provider. Document Revised: 10/03/2017 Document Reviewed: 07/06/2016 Elsevier Patient Education  2020 Elsevier Inc.  

## 2019-09-07 NOTE — Assessment & Plan Note (Signed)
Plan:  Continue Spiriva Respimat 1.25 Please work on stopping smoking We will coordinate a clinical pharmacy team visit to work on smoking cessation Follow-up in 4 months

## 2019-09-16 DIAGNOSIS — I10 Essential (primary) hypertension: Secondary | ICD-10-CM

## 2019-09-17 MED ORDER — NIFEDIPINE ER 60 MG PO TB24: 120.0000 mg | ORAL_TABLET | Freq: Every day | ORAL | 3 refills | Status: DC

## 2019-09-29 NOTE — Progress Notes (Signed)
HPI Patient referred by Wyn Quaker, FNP, on 09/07/2019 for inhaler education and smoking cessation.  Past medical history includes COPD, OSA, GERD, HTN, aortic atherosclerosis, HLD, vitamin D deficiency, and anxiety. Patient had decreased from smoking 1 PPD to 12 cigarettes per day at last appt with Wyn Quaker. She does not appear to be taking any smoking cessation agents. She has tried Chantix previously, which caused night terrors. She also tried the NRT patch, but states this medication lacked efficacy. She reports efficacy when using Spiriva Respimat. Of note, Chantix will cost $45 for 30 day supply and bupropion will cost $5 for 30 day supply via patient's insurance. Patient has Medicare, therefore, will not cover nicotine replacement therapies.  Patient presents for initial appt. Patient was using Spiriva Respimat one to two puffs daily. She stated she would randomly use two puffs daily if her breathing was "worse" that day. She is unsure the dose she experienced night terrors with when using Chantix. She is unsure the dose she used when she previously used nicotine patch.     Respiratory Medications Current: Spiriva Respimat, Montelukast Tried in past: Symbicort (switched to Spiriva), albuterol prn Patient reports no known adherence challenges  Tobacco Use History  Age when started using tobacco on a daily basis 67.  Type: ecigs, cigarettes, marijuana  Number of cigarettes per day 1 PPD, brand Pallmall.  Smokes first cigarette within 15 minutes after waking.  Does not wake at night to smoke  Triggers include sitting still  Quit Attempt History   Most recent quit attempt 2020 (got down to 12 cigarettes per day).  Longest time ever been tobacco free "a couple months".  Methods tried in the past include   Chantix - night terrors (doesn't remember dose)  Nicotine patch - lacked efficacy (doesn't remember dose)   Motivators to quitting include health; barriers include  withdrawal symptoms    OBJECTIVE Allergies  Allergen Reactions  . Enalapril Swelling    Caused lips to swell and bloating.  Maryelizabeth Kaufmann [Evolocumab]     Outpatient Encounter Medications as of 10/05/2019  Medication Sig  . Alirocumab (PRALUENT) 150 MG/ML SOAJ Inject 150 mg into the skin every 14 (fourteen) days.  . Ascorbic Acid (VITAMIN C) 1000 MG tablet Take 1,000 mg by mouth daily.  Marland Kitchen aspirin 81 MG tablet Take 81 mg by mouth every other day.   . bisoprolol-hydrochlorothiazide (ZIAC) 10-6.25 MG tablet Take 0.5 tablets by mouth daily.  . Cholecalciferol (VITAMIN D3) 5000 UNITS TABS Take 5,000 Units by mouth daily.  . clonazePAM (KLONOPIN) 0.5 MG tablet TAKE 1/2 TO 1 TABLET BY MOUTH AT BEDTIME FOR SLEEP  . Cyanocobalamin (VITAMIN B12 PO) Take 1,000 mcg by mouth daily.  Marland Kitchen dextromethorphan-guaiFENesin (MUCINEX DM) 30-600 MG 12hr tablet Take 1 tablet by mouth as needed.   . fluticasone (FLONASE) 50 MCG/ACT nasal spray 1 Spray each nostril daily (Patient taking differently: as needed. 1 Spray each nostril daily)  . losartan (COZAAR) 100 MG tablet Take 1 tablet Daily for BP  . methylcellulose oral powder Take by mouth daily.   . montelukast (SINGULAIR) 10 MG tablet TAKE 1 TABLET(10 MG) BY MOUTH DAILY  . Multiple Vitamins-Minerals (MULTI COMPLETE PO) Take 1 capsule by mouth daily.  Marland Kitchen NIFEdipine (ADALAT CC) 60 MG 24 hr tablet Take 2 tablets (120 mg total) by mouth daily.  . Omega-3 Fatty Acids (FISH OIL) 1000 MG CAPS Take 1 capsule (1,000 mg total) by mouth 2 (two) times daily with a meal.  .  omeprazole (PRILOSEC) 40 MG capsule Take 1 capsule daily for Indigestion & Heartburn  . sucralfate (CARAFATE) 1 g tablet TAKE 1 TABLET(1 GRAM) BY MOUTH FOUR TIMES DAILY  . Tiotropium Bromide Monohydrate (SPIRIVA RESPIMAT) 1.25 MCG/ACT AERS Inhale 2 Inhalers into the lungs daily.  . Tiotropium Bromide Monohydrate (SPIRIVA RESPIMAT) 1.25 MCG/ACT AERS Inhale 2 puffs into the lungs daily.  . Tiotropium Bromide  Monohydrate (SPIRIVA RESPIMAT) 1.25 MCG/ACT AERS Inhale 2 puffs into the lungs daily.  . Zinc 50 MG TABS Take by mouth daily.   No facility-administered encounter medications on file as of 10/05/2019.     Immunization History  Administered Date(s) Administered  . Influenza, High Dose Seasonal PF 04/10/2017, 05/08/2019  . Influenza-Unspecified 03/05/2018  . PPD Test 09/21/2014  . Pneumococcal Conjugate-13 10/22/2017  . Pneumococcal Polysaccharide-23 02/07/2011, 01/26/2016  . Tdap 02/07/2011     PFTs PFT Results Latest Ref Rng & Units 12/11/2017  FVC-Pre L 3.30  FVC-Predicted Pre % 102  FVC-Post L 3.12  FVC-Predicted Post % 97  Pre FEV1/FVC % % 71  Post FEV1/FCV % % 79  FEV1-Pre L 2.34  FEV1-Predicted Pre % 95  FEV1-Post L 2.45  DLCO UNC% % 71  DLCO COR %Predicted % 74  TLC L 6.50  TLC % Predicted % 125  RV % Predicted % 127     Eosinophils (08/10/2019) Most recent blood eosinophil count was 181 cells/microL. Eos have been elevated > 300 cells/microL in the past.  Assessment   1. Inhaler Optimization  PIFR Inspiratory flow measured using the In-check DIAL G16 and was in range of 20-60 for use of pMDI device (Respimat). Patient scored 60 upon retraining. Patient is able to demonstrate she is using Spiriva Respimat appropriately.   Optimal inhaler for patient would be Spiriva Respimat considering appropriate PIFR score and patient finds agent efficacious. Counseled patient thoroughly on inhaling 2 puffs daily rather than 1-2 puffs daily. Patient verbalized understanding.   Patient was counseled on the purpose, proper use, and adverse effects of Spiriva Respimat inhaler. Patient verbalized understanding.  Demonstrated proper inhaler technique using Respimat demo inhaler.  Patient able to demonstrate proper inhaler technique using teach back method.  2. Smoking Cessation  a. Provided information on 1 800-QUIT NOW support program.  b. Non-Pharmacologic - provided patient  with handout and advised her to implement 2-3 non-pharmacologic methods to assist in smoking cessation c. Pharmacologic i. Initiated nicotine replacement tx with bupropion, nicotine patch 21 mg daily, and nicotine lozenge 4 mg daily considering smoking history, bupropion is affordable via her insurance, and she is able to obtain NRT via [place] [facility]. Patient counseled on purpose, proper use, and potential adverse effects.  1. Nicotine Patch a. Patient counseled on purpose, proper use, and potential adverse effects, including mild itching or redness at the point of application, headache, trouble sleeping, and/or vivid dreams    b. Patch Schedule for >10 cigarettes daily i. Weeks 1-6: one 21 mg patch daily ii. Weeks 7-8: one 14 mg patch daily iii. Weeks 9-10: one 7 mg patch daily 2. Nicotine Lozenge a. Patient counseled on purpose, proper use, and potential adverse effects including nausea, hiccups, cough, and heartburn.  Instructed patient to use  2 mg unless the smoke within 30 minutes of waking up in which they should use 4 mg. b. Lozenge dosing schedule i. Weeks 1 to 6: 1 lozenge every 1 to 2 hours (maximum: 5 lozenges every 6 hours; 20 lozenges/day); to increase chances of quitting, use at least 9  lozenges/day during the first 6 weeks ii. Weeks 7 to 9: 1 lozenge every 2 to 4 hours (maximum: 5 lozenges every 6 hours; 20 lozenges/day) iii. Weeks 10 to 12: 1 lozenge every 4 to 8 hours (maximum: 5 lozenges every 6 hours; 20 lozenges/day) 3. Bupropion a. Initiated bupropion XL 150 mg daily for 7 days then bupropion XL 300 mg daily. Patient with no PMH of seizures. Patient counseled on purpose, proper use, and potential adverse effects, including insomnia, and potential change in mood.  3. Medication Reconciliation  A drug regimen assessment was performed, including review of allergies, interactions, disease-state management, dosing and immunization history. Medications were reviewed with the  patient, including name, instructions, indication, goals of therapy, potential side effects, importance of adherence, and safe use.  Drug interaction(s): 1. Sucralfate and vitamin D (major interaction): Vitamin D Analogs may increase the serum concentration of Sucralfate. Specifically, the absorption of aluminum from sucralfate may be increased, leading to an increase in the serum aluminum concentration. Avoid chronic and/or excessive use of aluminum and aluminum-containing products (such as sucralfate) in patients who are also taking vitamin D analogs. Any patients consuming such a combination should be monitored closely for aluminum status and signs/symptoms of aluminum-related toxicities. Appears sucralfate is being taken prn. Will discuss benefits vs risks of using this medication.   4. Immunizations  Patient is indicated for the shingles vaccination. Sent in rx to pharmacy.   PLAN 1. START using Spiriva Respimat 2 puffs daily (previously was using 1 puff daily) 2. START using bupropion XL 150 mg once daily for 7 days then increase to bupropion XL 300 mg once daily. Prescription will be ready to pick up from [facility]. 3. START using nicotine patch 21 mg daily and nicotine lozenge 4 mg (obtain via [facility]). 4. Flonase nasal spray refill and Shingles rx sent to [place].  5. Advised patient about aluminum toxicity signs and symptoms. Advised patient to follow up with sucralfate provider to discuss drug interactions with vitamin D.  All questions encouraged and answered.  Instructed patient to reach out with any further questions or concerns.  Thank you for allowing pharmacy to participate in this patient's care.  This appointment required 30 minutes of patient care (this includes precharting, chart review, review of results, face-to-face care, etc.).  Drexel Iha, PharmD PGY2 Ambulatory Care Pharmacy Resident

## 2019-10-05 ENCOUNTER — Ambulatory Visit (INDEPENDENT_AMBULATORY_CARE_PROVIDER_SITE_OTHER): Payer: PPO | Admitting: Pharmacist

## 2019-10-05 ENCOUNTER — Other Ambulatory Visit: Payer: Self-pay

## 2019-10-05 DIAGNOSIS — F17200 Nicotine dependence, unspecified, uncomplicated: Secondary | ICD-10-CM

## 2019-10-05 DIAGNOSIS — Z79899 Other long term (current) drug therapy: Secondary | ICD-10-CM

## 2019-10-05 DIAGNOSIS — J432 Centrilobular emphysema: Secondary | ICD-10-CM

## 2019-10-05 MED ORDER — ZOSTER VAC RECOMB ADJUVANTED 50 MCG/0.5ML IM SUSR: 0.5000 mL | Freq: Once | INTRAMUSCULAR | 1 refills | Status: AC

## 2019-10-05 MED ORDER — BUPROPION HCL ER (XL) 150 MG PO TB24: 150.0000 mg | ORAL_TABLET | Freq: Every day | ORAL | 0 refills | Status: DC

## 2019-10-05 MED ORDER — FLUTICASONE PROPIONATE 50 MCG/ACT NA SUSP: NASAL | 1 refills | Status: AC

## 2019-10-05 MED ORDER — BUPROPION HCL ER (XL) 300 MG PO TB24: 300.0000 mg | ORAL_TABLET | Freq: Every day | ORAL | 11 refills | Status: DC

## 2019-10-05 NOTE — Patient Instructions (Addendum)
It was a pleasure seeing you in clinic today [name]!  Today the plan is... 1. Inhale Spiriva Respimat 2 puffs daily (30 seconds to 1 minute in between each puff). You do not have to rinse your mouth out 2. Call [facility] [facility] to get nicotine patch 21 mg daily and nicotine lozenge 4 mg daily. You will get this medicine through the mail 3. Start using bupropion XL 150 mg daily for 1 week. Then increase to bupropion XL 300 mg daily. Make sure to eat food before you take medicine. You will get this medicine at  [facility]  4. Vitamin D interacts with Sucralfate and increase concentration of aluminum. Symptoms of aluminum toxicity include confusion, muscle weakness, bone pain. If you start experiencing these symptoms STOP taking sucralfate and discuss alternative therapy options with provider. 5. Flonase refill will be ready for pick up at [facility] 6. Shingles vaccine prescription will be ready at [facility] as well  Please call the PharmD clinic at [phone] if you have any questions that you would like to speak with a pharmacist about [name], [name]).

## 2019-10-07 ENCOUNTER — Other Ambulatory Visit: Payer: Self-pay | Admitting: Internal Medicine

## 2019-10-07 DIAGNOSIS — R079 Chest pain, unspecified: Secondary | ICD-10-CM

## 2019-10-07 DIAGNOSIS — R1013 Epigastric pain: Secondary | ICD-10-CM

## 2019-10-07 MED ORDER — OMEPRAZOLE 40 MG PO CPDR: DELAYED_RELEASE_CAPSULE | ORAL | 3 refills | Status: DC

## 2019-10-14 ENCOUNTER — Other Ambulatory Visit: Payer: Self-pay

## 2019-10-14 ENCOUNTER — Other Ambulatory Visit: Payer: Self-pay | Admitting: Adult Health

## 2019-10-14 ENCOUNTER — Encounter: Payer: Self-pay | Admitting: Adult Health

## 2019-10-14 ENCOUNTER — Ambulatory Visit (INDEPENDENT_AMBULATORY_CARE_PROVIDER_SITE_OTHER): Payer: PPO | Admitting: Adult Health

## 2019-10-14 VITALS — BP 118/60 | HR 72 | Temp 97.5°F | Wt 167.0 lb

## 2019-10-14 DIAGNOSIS — R196 Halitosis: Secondary | ICD-10-CM

## 2019-10-14 DIAGNOSIS — R140 Abdominal distension (gaseous): Secondary | ICD-10-CM | POA: Diagnosis not present

## 2019-10-14 DIAGNOSIS — R142 Eructation: Secondary | ICD-10-CM | POA: Diagnosis not present

## 2019-10-14 DIAGNOSIS — K219 Gastro-esophageal reflux disease without esophagitis: Secondary | ICD-10-CM | POA: Diagnosis not present

## 2019-10-14 DIAGNOSIS — F5101 Primary insomnia: Secondary | ICD-10-CM

## 2019-10-14 DIAGNOSIS — F17200 Nicotine dependence, unspecified, uncomplicated: Secondary | ICD-10-CM

## 2019-10-14 MED ORDER — BUPROPION HCL ER (XL) 300 MG PO TB24: 300.0000 mg | ORAL_TABLET | Freq: Every day | ORAL | 11 refills | Status: DC

## 2019-10-14 MED ORDER — METRONIDAZOLE 500 MG PO TABS: 500.0000 mg | ORAL_TABLET | Freq: Three times a day (TID) | ORAL | 0 refills | Status: DC

## 2019-10-14 MED ORDER — CLONAZEPAM 0.5 MG PO TABS: ORAL_TABLET | ORAL | 0 refills | Status: DC

## 2019-10-14 MED ORDER — CIPROFLOXACIN HCL 500 MG PO TABS: 500.0000 mg | ORAL_TABLET | Freq: Two times a day (BID) | ORAL | 0 refills | Status: AC

## 2019-10-14 NOTE — Patient Instructions (Addendum)
GI stability supplement by "standard process" brand   Start after completing antibiotic regimen   Avoid coffee on empty stomach     Ciprofloxacin tablets What is this medicine? CIPROFLOXACIN (sip roe FLOX a sin) is a quinolone antibiotic. It is used to treat certain kinds of bacterial infections. It will not work for colds, flu, or other viral infections. This medicine may be used for other purposes; ask your health care provider or pharmacist if you have questions. COMMON BRAND NAME(S): Cipro What should I tell my health care provider before I take this medicine? They need to know if you have any of these conditions:  bone problems  diabetes  heart disease  high blood pressure  history of irregular heartbeat  history of low levels of potassium in the blood  joint problems  kidney disease  liver disease  mental illness  myasthenia gravis  seizures  tendon problems  tingling of the fingers or toes, or other nerve disorder  an unusual or allergic reaction to ciprofloxacin, other antibiotics or medicines, foods, dyes, or preservatives  pregnant or trying to get pregnant  breast-feeding How should I use this medicine? Take this medicine by mouth with a full glass of water. Follow the directions on the prescription label. You can take it with or without food. If it upsets your stomach, take it with food. Take your medicine at regular intervals. Do not take your medicine more often than directed. Take all of your medicine as directed even if you think you are better. Do not skip doses or stop your medicine early. Avoid antacids, aluminum, calcium, iron, magnesium, and zinc products for 6 hours before and 2 hours after taking a dose of this medicine. A special MedGuide will be given to you by the pharmacist with each prescription and refill. Be sure to read this information carefully each time. Talk to your pediatrician regarding the use of this medicine in children.  Special care may be needed. Overdosage: If you think you have taken too much of this medicine contact a poison control center or emergency room at once. NOTE: This medicine is only for you. Do not share this medicine with others. What if I miss a dose? If you miss a dose, take it as soon as you can. If it is almost time for your next dose, take only that dose. Do not take double or extra doses. What may interact with this medicine? Do not take this medicine with any of the following medications:  cisapride  dronedarone  flibanserin  lomitapide  pimozide  thioridazine  tizanidine This medicine may also interact with the following medications:  antacids  birth control pills  caffeine  certain medicines for diabetes, like glipizide, glyburide, or insulin  certain medicines that treat or prevent blood clots like warfarin  clozapine  cyclosporine  didanosine buffered tablets or powder  dofetilide  duloxetine  lanthanum carbonate  lidocaine  methotrexate  multivitamins  NSAIDS, medicines for pain and inflammation, like ibuprofen or naproxen  olanzapine  omeprazole  other medicines that prolong the QT interval (cause an abnormal heart rhythm)  phenytoin  probenecid  ropinirole  sevelamer  sildenafil  sucralfate  theophylline  ziprasidone  zolpidem This list may not describe all possible interactions. Give your health care provider a list of all the medicines, herbs, non-prescription drugs, or dietary supplements you use. Also tell them if you smoke, drink alcohol, or use illegal drugs. Some items may interact with your medicine. What should I watch  for while using this medicine? Tell your doctor or health care provider if your symptoms do not start to get better or if they get worse. This medicine may cause serious skin reactions. They can happen weeks to months after starting the medicine. Contact your health care provider right away if you  notice fevers or flu-like symptoms with a rash. The rash may be red or purple and then turn into blisters or peeling of the skin. Or, you might notice a red rash with swelling of the face, lips or lymph nodes in your neck or under your arms. Do not treat diarrhea with over the counter products. Contact your doctor if you have diarrhea that lasts more than 2 days or if it is severe and watery. Check with your doctor or health care provider if you get an attack of severe diarrhea, nausea and vomiting, or if you sweat a lot. The loss of too much body fluid can make it dangerous for you to take this medicine. This medicine may increase blood sugar. Ask your health care provider if changes in diet or medicines are needed if you have diabetes. You may get drowsy or dizzy. Do not drive, use machinery, or do anything that needs mental alertness until you know how this medicine affects you. Do not sit or stand up quickly, especially if you are an older patient. This reduces the risk of dizzy or fainting spells. This medicine can make you more sensitive to the sun. Keep out of the sun. If you cannot avoid being in the sun, wear protective clothing and use sunscreen. Do not use sun lamps or tanning beds/booths. What side effects may I notice from receiving this medicine? Side effects that you should report to your doctor or health care professional as soon as possible:  allergic reactions like skin rash or hives, swelling of the face, lips, or tongue  anxious  bloody or watery diarrhea  confusion  depressed mood  fast, irregular heartbeat  fever  hallucination, loss of contact with reality  joint, muscle, or tendon pain or swelling  loss of memory  pain, tingling, numbness in the hands or feet  redness, blistering, peeling or loosening of the skin, including inside the mouth  seizures  signs and symptoms of aortic dissection such as sudden chest, stomach, or back pain  signs and symptoms of  high blood sugar such as being more thirsty or hungry or having to urinate more than normal. You may also feel very tired or have blurry vision.  signs and symptoms of liver injury like dark yellow or brown urine; general ill feeling or flu-like symptoms; light-colored stools; loss of appetite; nausea; right upper belly pain; unusually weak or tired; yellowing of the eyes or skin  signs and symptoms of low blood sugar such as feeling anxious; confusion; dizziness; increased hunger; unusually weak or tired; sweating; shakiness; cold; irritable; headache; blurred vision; fast heartbeat; loss of consciousness; pale skin  suicidal thoughts or other mood changes  sunburn  unusually weak or tired Side effects that usually do not require medical attention (report to your doctor or health care professional if they continue or are bothersome):  dry mouth  headache  nausea  trouble sleeping This list may not describe all possible side effects. Call your doctor for medical advice about side effects. You may report side effects to FDA at 1-800-FDA-1088. Where should I keep my medicine? Keep out of the reach of children. Store at room temperature below 30 degrees C (  86 degrees F). Keep container tightly closed. Throw away any unused medicine after the expiration date. NOTE: This sheet is a summary. It may not cover all possible information. If you have questions about this medicine, talk to your doctor, pharmacist, or health care provider.  2020 Elsevier/Gold Standard (2018-10-02 11:26:08)       Metronidazole tablets or capsules What is this medicine? METRONIDAZOLE (me troe NI da zole) is an antiinfective. It is used to treat certain kinds of bacterial and protozoal infections. It will not work for colds, flu, or other viral infections. This medicine may be used for other purposes; ask your health care provider or pharmacist if you have questions. COMMON BRAND NAME(S): Flagyl What should I  tell my health care provider before I take this medicine? They need to know if you have any of these conditions:  [place] syndrome  history of blood diseases, like sickle cell anemia or leukemia  history of yeast infection  if you often drink alcohol  liver disease  an unusual or allergic reaction to metronidazole, nitroimidazoles, or other medicines, foods, dyes, or preservatives  pregnant or trying to get pregnant  breast-feeding How should I use this medicine? Take this medicine by mouth with a full glass of water. Follow the directions on the prescription label. Take your medicine at regular intervals. Do not take your medicine more often than directed. Take all of your medicine as directed even if you think you are better. Do not skip doses or stop your medicine early. Talk to your pediatrician regarding the use of this medicine in children. Special care may be needed. Overdosage: If you think you have taken too much of this medicine contact a poison control center or emergency room at once. NOTE: This medicine is only for you. Do not share this medicine with others. What if I miss a dose? If you miss a dose, take it as soon as you can. If it is almost time for your next dose, take only that dose. Do not take double or extra doses. What may interact with this medicine? Do not take this medicine with any of the following medications:  alcohol or any product that contains alcohol  cisapride  disulfiram  dronedarone  pimozide  thioridazine This medicine may also interact with the following medications:  amiodarone  birth control pills  busulfan  carbamazepine  cimetidine  cyclosporine  fluorouracil  lithium  other medicines that prolong the QT interval (cause an abnormal heart rhythm) like dofetilide, ziprasidone  phenobarbital  phenytoin  quinidine  tacrolimus  vecuronium  warfarin This list may not describe all possible interactions. Give  your health care provider a list of all the medicines, herbs, non-prescription drugs, or dietary supplements you use. Also tell them if you smoke, drink alcohol, or use illegal drugs. Some items may interact with your medicine. What should I watch for while using this medicine? Tell your doctor or health care professional if your symptoms do not improve or if they get worse. You may get drowsy or dizzy. Do not drive, use machinery, or do anything that needs mental alertness until you know how this medicine affects you. Do not stand or sit up quickly, especially if you are an older patient. This reduces the risk of dizzy or fainting spells. Ask your doctor or health care professional if you should avoid alcohol. Many nonprescription cough and cold products contain alcohol. Metronidazole can cause an unpleasant reaction when taken with alcohol. The reaction includes flushing, headache, nausea,  vomiting, sweating, and increased thirst. The reaction can last from 30 minutes to several hours. If you are being treated for a sexually transmitted disease, avoid sexual contact until you have finished your treatment. Your sexual partner may also need treatment. What side effects may I notice from receiving this medicine? Side effects that you should report to your doctor or health care professional as soon as possible:  allergic reactions like skin rash or hives, swelling of the face, lips, or tongue  confusion  fast, irregular heartbeat  fever, chills, sore throat  fever with rash, swollen lymph nodes, or swelling of the face  pain, tingling, numbness in the hands or feet  redness, blistering, peeling or loosening of the skin, including inside the mouth  seizures  sign and symptoms of liver injury like dark yellow or brown urine; general ill feeling or flu-like symptoms; light colored stools; loss of appetite; nausea; right upper belly pain; unusually weak or tired; yellowing of the eyes or  skin  vaginal discharge, itching, or odor in women Side effects that usually do not require medical attention (report to your doctor or health care professional if they continue or are bothersome):  changes in taste  diarrhea  headache  nausea, vomiting  stomach pain This list may not describe all possible side effects. Call your doctor for medical advice about side effects. You may report side effects to FDA at 1-800-FDA-1088. Where should I keep my medicine? Keep out of the reach of children. Store at room temperature below 25 degrees C (77 degrees F). Protect from light. Keep container tightly closed. Throw away any unused medicine after the expiration date. NOTE: This sheet is a summary. It may not cover all possible information. If you have questions about this medicine, talk to your doctor, pharmacist, or health care provider.  2020 Elsevier/Gold Standard (2018-06-24 06:52:33)

## 2019-10-14 NOTE — Progress Notes (Signed)
Assessment and Plan:  [name] was seen today for gi problem.  Diagnoses and all orders for this visit:  Abdominal bloating Belching Gastroesophageal reflux disease, unspecified whether esophagitis present Breath, foul Persistent GERD like symptoms but also newly reports foul smelling odor with belching today, "like poop"  Has had significant  workup including CT, EGD with biopsy - thus far without good explanation besides GERD, no hiatal hernia, normal gallbladder, no ulcer  Today I wonder if SIBO may be a reasonable explanation for some of her persistent symptoms; we discussed this at length today Discussed GI follow up for carbohydrate testing and what this would entail, she would prefer to proceed with presumptive treatment with abx and follow up here for evaluation of benefit; cipro/flagyl were ordered Recommended addition of Standard process "GI stability" supplement after completion of abx and follow up in 2 weeks, sooner if any new concerning sx If no improvement will switch omeprazole for alternative PPI, consider esomeprazole, pantoprazole  Carafate refilled Reviewed GERD lifestyle; avoid coffee on empty stomach, reduce alcohol, avoid NSAIDs -     ciprofloxacin (CIPRO) 500 MG tablet; Take 1 tablet (500 mg total) by mouth 2 (two) times daily for 7 days. -     metroNIDAZOLE (FLAGYL) 500 MG tablet; Take 1 tablet (500 mg total) by mouth 3 (three) times daily.  Current smoker -     buPROPion (WELLBUTRIN XL) 300 MG 24 hr tablet; Take 1 tablet (300 mg total) by mouth daily.  Primary insomnia -     clonazePAM (KLONOPIN) 0.5 MG tablet; Take 1/2 to 1 tablet as needed  Further disposition pending results of labs. Discussed med's effects and SE's.   Over 30 minutes of exam, counseling, chart review, and critical decision making was performed.   Future Appointments  Date Time Provider [place]  10/29/2019  3:30 PM Liane Comber, NP [facility] None  11/09/2019 10:30 AM Unk Pinto, MD [facility] None  01/05/2020  9:00 AM Lauraine Rinne, NP [facility] None  02/17/2020  9:00 AM Unk Pinto, MD [facility] None    ------------------------------------------------------------------------------------------------------------------   HPI BP 118/60   Pulse 72   Temp (!) 97.5 F (36.4 C)   Wt 167 lb (75.8 kg)   SpO2 96%   BMI 27.79 kg/m   69 y.o.female smoker (currently in cessation program, patches and will start chantix) with GERD, IBS presents for evaluation due to bloating, belching, burning in her chest, halitosis; GERD poorly controlled for "several years" per patient, progressive sx.   She was evaluated for similar sx last year, epigastric abdominal discomfort in 02/2019, famotidine, carafate were added to omeprazole 40 mg without good control, was referred back to GI Dr. Loletha Carrow who evaluated and did EGD 05/06/2019 with benign exam and pathology results other than mild inflammation consistent with reflux.   She had colonoscopy in 2015 with ulcer noted at splenic flexture and single non-adenomatous polyp removed, was recommended 10 year follow up by Dr. Deatra Ina.   Currently has been taking omeprazole 40 mg daily in the morning 1 hour prior to breakfast, recently has also been taking carafate 1 g dissolved in water 3 times a day prior to meals which has helped burning in chest but not resolved symptoms. She was on famotidine after evaluation last fall but is currently off, not sure why or if this helped.   She reports wakes up with burning and foul taste in her mouth, "bitter", reports halitosis "like poop" ongoing for several years, progressive. She endorses frequent belching,  taste of bile in her mouth. She reports good BM, good movement daily, typically sensation of good evacuation. Denies black stools.    She has been avoiding spicy food, chocolate, garlic, tomatoes, anything acidic; she limits coffee to 1 cup in the AM. She does admit to frequent beer in the  evenings.   No hiatal hernia, liver abnormality or gallstones noted on recent imaging -  CT chest in 05/2019 or on CT abd 04/2017.   She has hx of IBS, reports recently fairly controlled, has had diarrhea episodes in the past, improved with lifestyle modification.    Past Medical History:  Diagnosis Date  . Allergy    enviromental  . Anxiety   . Arthritis    osteoarthritis  . COPD (chronic obstructive pulmonary disease) (HCC)    mild  . Diabetes mellitus without complication ([place])    controlled by diet  . Environmental allergies   . GERD (gastroesophageal reflux disease)   . Hepatitis    hepatitis A  . History of colon polyps   . Hyperlipidemia   . Hypertension   . IBS (irritable bowel syndrome)   . Mixed hyperlipidemia 06/15/2013  . PONV (postoperative nausea and vomiting)    second surgery no problems last surgery  . Solitary pulmonary nodule 06/02/2019  . Urgency of urination    Takes oxybutynin; under control at this time     Allergies  Allergen Reactions  . Enalapril Swelling    Caused lips to swell and bloating.  . Repatha [Evolocumab]     Stated she didn't tolerate it and switched to Praluent    Current Outpatient Medications on File Prior to Visit  Medication Sig  . Alirocumab (PRALUENT) 150 MG/ML SOAJ Inject 150 mg into the skin every 14 (fourteen) days.  . Ascorbic Acid (VITAMIN C) 1000 MG tablet Take 1,000 mg by mouth daily.  Marland Kitchen aspirin 81 MG tablet Take 81 mg by mouth every other day.   . bisoprolol-hydrochlorothiazide (ZIAC) 10-6.25 MG tablet Take 0.5 tablets by mouth daily.  . Cholecalciferol (VITAMIN D3) 5000 UNITS TABS Take 5,000 Units by mouth daily.  . Cyanocobalamin (VITAMIN B12 PO) Take 1,000 mcg by mouth daily.  Marland Kitchen dextromethorphan-guaiFENesin (MUCINEX DM) 30-600 MG 12hr tablet Take 1 tablet by mouth as needed.   . fluticasone (FLONASE) 50 MCG/ACT nasal spray 1 Spray each nostril daily  . losartan (COZAAR) 100 MG tablet Take 1 tablet Daily for BP   . methylcellulose oral powder Take by mouth daily.   . montelukast (SINGULAIR) 10 MG tablet TAKE 1 TABLET(10 MG) BY MOUTH DAILY  . Multiple Vitamins-Minerals (MULTI COMPLETE PO) Take 1 capsule by mouth daily.  Marland Kitchen NIFEdipine (PROCARDIA XL/NIFEDICAL XL) 60 MG 24 hr tablet Take 120 mg by mouth daily.  . Omega-3 Fatty Acids (FISH OIL) 1000 MG CAPS Take 1 capsule (1,000 mg total) by mouth 2 (two) times daily with a meal.  . omeprazole (PRILOSEC) 40 MG capsule Take 1 capsule daily for Indigestion & Heartburn  . sucralfate (CARAFATE) 1 g tablet TAKE 1 TABLET(1 GRAM) BY MOUTH FOUR TIMES DAILY  . Tiotropium Bromide Monohydrate (SPIRIVA RESPIMAT) 1.25 MCG/ACT AERS Inhale 2 puffs into the lungs daily.  . Zinc 50 MG TABS Take by mouth daily.   No current facility-administered medications on file prior to visit.    ROS: all negative except above.   Physical Exam:  BP 118/60   Pulse 72   Temp (!) 97.5 F (36.4 C)   Wt 167 lb (75.8 kg)  SpO2 96%   BMI 27.79 kg/m   General Appearance: Well nourished, in no apparent distress. Eyes: PERRLA, EOMs, conjunctiva no swelling or erythema Sinuses: No Frontal/maxillary tenderness ENT/Mouth: Ext aud canals clear, TMs without erythema, bulging. No erythema, swelling, or exudate on post pharynx.  Tonsils not swollen or erythematous. Dental hygiene is fair, no abscess noted. Hearing normal. Chronic hoarse vocal quality .  Neck: Supple, thyroid normal.  Respiratory: Respiratory effort normal, BS equal bilaterally without rales, rhonchi, wheezing or stridor.  Cardio: RRR with no MRGs. Brisk peripheral pulses without edema.  Abdomen: Soft, + BS.  Mild generalized upper abdominal tenderness, no guarding, rebound, hernias, masses. Lymphatics: Non tender without lymphadenopathy.  Musculoskeletal: No obvious deformity, normal gait.  Skin: Warm, dry without rashes, lesions, ecchymosis.  Neuro: Cranial nerves intact. Normal muscle tone, no cerebellar symptoms.  Sensation intact.  Psych: Awake and oriented X 3, normal affect, Insight and Judgment appropriate.     Izora Ribas, NP 12:29 PM [facility]

## 2019-10-21 ENCOUNTER — Other Ambulatory Visit: Payer: Self-pay

## 2019-10-21 MED ORDER — MONTELUKAST SODIUM 10 MG PO TABS: ORAL_TABLET | ORAL | 3 refills | Status: DC

## 2019-10-26 NOTE — Progress Notes (Deleted)
Assessment and Plan:  [name] was seen today for gi problem.  Diagnoses and all orders for this visit:  Abdominal bloating Belching Gastroesophageal reflux disease, unspecified whether esophagitis present Breath, foul Persistent GERD like symptoms but also newly reports foul smelling odor with belching today, "like poop"  Has had significant  workup including CT, EGD with biopsy - thus far without good explanation besides GERD, no hiatal hernia, normal gallbladder, no ulcer  Today I wonder if SIBO may be a reasonable explanation for some of her persistent symptoms; we discussed this at length today Discussed GI follow up for carbohydrate testing and what this would entail, she would prefer to proceed with presumptive treatment with abx and follow up here for evaluation of benefit; cipro/flagyl were ordered Recommended addition of Standard process "GI stability" supplement after completion of abx and follow up in 2 weeks, sooner if any new concerning sx If no improvement will switch omeprazole for alternative PPI, consider esomeprazole, pantoprazole  Carafate refilled Reviewed GERD lifestyle; avoid coffee on empty stomach, reduce alcohol, avoid NSAIDs -     ciprofloxacin (CIPRO) 500 MG tablet; Take 1 tablet (500 mg total) by mouth 2 (two) times daily for 7 days. -     metroNIDAZOLE (FLAGYL) 500 MG tablet; Take 1 tablet (500 mg total) by mouth 3 (three) times daily.  Current smoker -     buPROPion (WELLBUTRIN XL) 300 MG 24 hr tablet; Take 1 tablet (300 mg total) by mouth daily.  Primary insomnia -     clonazePAM (KLONOPIN) 0.5 MG tablet; Take 1/2 to 1 tablet as needed  Further disposition pending results of labs. Discussed med's effects and SE's.   Over 30 minutes of exam, counseling, chart review, and critical decision making was performed.   Future Appointments  Date Time Provider [place]  10/29/2019  3:30 PM Liane Comber, NP [facility] None  11/09/2019 10:30 AM Unk Pinto, MD [facility] None  01/05/2020  9:00 AM Lauraine Rinne, NP [facility] None  02/17/2020  9:00 AM Unk Pinto, MD [facility] None    ------------------------------------------------------------------------------------------------------------------   HPI There were no vitals taken for this visit.  69 y.o.female smoker (currently in cessation program, patches and will start chantix) with GERD, IBS presents for evaluation due to bloating, belching, burning in her chest, halitosis; GERD poorly controlled for "several years" per patient, progressive sx.   She was evaluated for similar sx last year, epigastric abdominal discomfort in 02/2019, famotidine, carafate were added to omeprazole 40 mg without good control, was referred back to GI Dr. Loletha Carrow who evaluated and did EGD 05/06/2019 with benign exam and pathology results other than mild inflammation consistent with reflux.   She had colonoscopy in 2015 with ulcer noted at splenic flexture and single non-adenomatous polyp removed, was recommended 10 year follow up by Dr. Deatra Ina.   Currently has been taking omeprazole 40 mg daily in the morning 1 hour prior to breakfast, recently has also been taking carafate 1 g dissolved in water 3 times a day prior to meals which has helped burning in chest but not resolved symptoms. She was on famotidine after evaluation last fall but is currently off, not sure why or if this helped.   She reports wakes up with burning and foul taste in her mouth, "bitter", reports halitosis "like poop" ongoing for several years, progressive. She endorses frequent belching, taste of bile in her mouth. She reports good BM, good movement daily, typically sensation of good evacuation. Denies black stools.  She has been avoiding spicy food, chocolate, garlic, tomatoes, anything acidic; she limits coffee to 1 cup in the AM. She does admit to frequent beer in the evenings. NSAIDs ***  No hiatal hernia, liver abnormality or  gallstones noted on recent imaging -  CT chest in 05/2019 or on CT abd 04/2017.   She has hx of IBS, reports recently fairly controlled, has had diarrhea episodes in the past, improved with lifestyle modification.    Past Medical History:  Diagnosis Date  . Allergy    enviromental  . Anxiety   . Arthritis    osteoarthritis  . COPD (chronic obstructive pulmonary disease) (HCC)    mild  . Diabetes mellitus without complication ([place])    controlled by diet  . Environmental allergies   . GERD (gastroesophageal reflux disease)   . Hepatitis    hepatitis A  . History of colon polyps   . Hyperlipidemia   . Hypertension   . IBS (irritable bowel syndrome)   . Mixed hyperlipidemia 06/15/2013  . PONV (postoperative nausea and vomiting)    second surgery no problems last surgery  . Solitary pulmonary nodule 06/02/2019  . Urgency of urination    Takes oxybutynin; under control at this time     Allergies  Allergen Reactions  . Enalapril Swelling    Caused lips to swell and bloating.  . Repatha [Evolocumab]     Stated she didn't tolerate it and switched to Praluent    Current Outpatient Medications on File Prior to Visit  Medication Sig  . Alirocumab (PRALUENT) 150 MG/ML SOAJ Inject 150 mg into the skin every 14 (fourteen) days.  . Ascorbic Acid (VITAMIN C) 1000 MG tablet Take 1,000 mg by mouth daily.  Marland Kitchen aspirin 81 MG tablet Take 81 mg by mouth every other day.   . bisoprolol-hydrochlorothiazide (ZIAC) 10-6.25 MG tablet Take 0.5 tablets by mouth daily.  Marland Kitchen buPROPion (WELLBUTRIN XL) 300 MG 24 hr tablet Take 1 tablet (300 mg total) by mouth daily.  . Cholecalciferol (VITAMIN D3) 5000 UNITS TABS Take 5,000 Units by mouth daily.  . clonazePAM (KLONOPIN) 0.5 MG tablet Take 1/2 to 1 tablet as needed  . Cyanocobalamin (VITAMIN B12 PO) Take 1,000 mcg by mouth daily.  Marland Kitchen dextromethorphan-guaiFENesin (MUCINEX DM) 30-600 MG 12hr tablet Take 1 tablet by mouth as needed.   . fluticasone (FLONASE)  50 MCG/ACT nasal spray 1 Spray each nostril daily  . losartan (COZAAR) 100 MG tablet Take 1 tablet Daily for BP  . methylcellulose oral powder Take by mouth daily.   . metroNIDAZOLE (FLAGYL) 500 MG tablet Take 1 tablet (500 mg total) by mouth 3 (three) times daily.  . montelukast (SINGULAIR) 10 MG tablet TAKE 1 TABLET(10 MG) BY MOUTH DAILY  . Multiple Vitamins-Minerals (MULTI COMPLETE PO) Take 1 capsule by mouth daily.  Marland Kitchen NIFEdipine (PROCARDIA XL/NIFEDICAL XL) 60 MG 24 hr tablet Take 120 mg by mouth daily.  . Omega-3 Fatty Acids (FISH OIL) 1000 MG CAPS Take 1 capsule (1,000 mg total) by mouth 2 (two) times daily with a meal.  . omeprazole (PRILOSEC) 40 MG capsule Take 1 capsule daily for Indigestion & Heartburn  . sucralfate (CARAFATE) 1 g tablet TAKE 1 TABLET(1 GRAM) BY MOUTH FOUR TIMES DAILY  . Tiotropium Bromide Monohydrate (SPIRIVA RESPIMAT) 1.25 MCG/ACT AERS Inhale 2 puffs into the lungs daily.  . Zinc 50 MG TABS Take by mouth daily.   No current facility-administered medications on file prior to visit.    ROS: all  negative except above.   Physical Exam:  There were no vitals taken for this visit.  General Appearance: Well nourished, in no apparent distress. Eyes: PERRLA, EOMs, conjunctiva no swelling or erythema Sinuses: No Frontal/maxillary tenderness ENT/Mouth: Ext aud canals clear, TMs without erythema, bulging. No erythema, swelling, or exudate on post pharynx.  Tonsils not swollen or erythematous. Dental hygiene is fair, no abscess noted. Hearing normal. Chronic hoarse vocal quality .  Neck: Supple, thyroid normal.  Respiratory: Respiratory effort normal, BS equal bilaterally without rales, rhonchi, wheezing or stridor.  Cardio: RRR with no MRGs. Brisk peripheral pulses without edema.  Abdomen: Soft, + BS.  Mild generalized upper abdominal tenderness, no guarding, rebound, hernias, masses. Lymphatics: Non tender without lymphadenopathy.  Musculoskeletal: No obvious  deformity, normal gait.  Skin: Warm, dry without rashes, lesions, ecchymosis.  Neuro: Cranial nerves intact. Normal muscle tone, no cerebellar symptoms. Sensation intact.  Psych: Awake and oriented X 3, normal affect, Insight and Judgment appropriate.     Izora Ribas, NP 3:27 PM [facility]

## 2019-10-29 ENCOUNTER — Encounter: Payer: Self-pay | Admitting: Adult Health

## 2019-10-29 ENCOUNTER — Ambulatory Visit: Payer: PPO | Admitting: Adult Health

## 2019-10-29 ENCOUNTER — Other Ambulatory Visit: Payer: Self-pay

## 2019-10-29 ENCOUNTER — Ambulatory Visit (INDEPENDENT_AMBULATORY_CARE_PROVIDER_SITE_OTHER): Payer: PPO | Admitting: Adult Health

## 2019-10-29 VITALS — BP 110/62 | HR 74 | Temp 96.8°F | Wt 169.0 lb

## 2019-10-29 DIAGNOSIS — R140 Abdominal distension (gaseous): Secondary | ICD-10-CM

## 2019-10-29 DIAGNOSIS — K219 Gastro-esophageal reflux disease without esophagitis: Secondary | ICD-10-CM | POA: Diagnosis not present

## 2019-10-29 NOTE — Progress Notes (Signed)
Assessment and Plan:  [name] was seen today for gi problem.  Diagnoses and all orders for this visit:  Abdominal bloating Belching Gastroesophageal reflux disease, unspecified whether esophagitis present Breath, foul She was treated presumptively for possible SIBO last visit with cipro/flagyl, initiated on "align" probiotic and presents reporting significantly improved sx. She is very appreciative today and pleased with her progress.  Reviewed lifestyle; discussed limiting caffeine, alcohol, avoid excessively large meals, don't lie down after meals Suggested she read "Fiber fueled" by Dr. Anabel Bene and implement plant based high fiber diet, continue to follow up with progress.   Further disposition pending results of labs. Discussed med's effects and SE's.   Over 15 minutes of exam, counseling, chart review, and critical decision making was performed.   Future Appointments  Date Time Provider [place]  11/09/2019 10:30 AM Unk Pinto, MD [facility] None  01/05/2020  9:00 AM Lauraine Rinne, NP [facility] None  02/17/2020  9:00 AM Unk Pinto, MD [facility] None    ------------------------------------------------------------------------------------------------------------------   HPI BP 110/62   Pulse 74   Temp (!) 96.8 F (36 C)   Wt 169 lb (76.7 kg)   SpO2 97%   BMI 28.12 kg/m   69 y.o.female smoker (currently in cessation program, patches and will start chantix) with GERD, IBS presents for evaluation due to bloating, belching, burning in her chest, halitosis; GERD poorly controlled for "several years" per patient, progressive sx.   She was evaluated for similar sx last year, epigastric abdominal discomfort in 02/2019, famotidine, carafate were added to omeprazole 40 mg without good control, was referred back to GI Dr. Loletha Carrow who evaluated and did EGD 05/06/2019 with benign exam and pathology results other than mild inflammation consistent with reflux. She  reports wakes up with burning and foul taste in her mouth, "bitter", reports halitosis "like poop" ongoing for several years, progressive. She endorses frequent belching, taste of bile in her mouth.   We considered possible SIBO, discussed follow up with GI for carbyhydrate breath test, she preferred trial of abx for presumptive treatment. Was prescripted cipro/flagyl and instructed to initiate "Align" or "Standard process- GI stability" supplement and presents for 2 week follow up.   She reports completed cipro/flagyl, then started align probiotic immediately after per instructions. She reports within 2-3 days, halitosis has significantly improved, no bitter taste in the morning, more energy, bloating improved, reduced belching/gassiness. Generally better than she has in a very long time. No concerns today. Reflux is well controlled currently taking omeprazole, off of famotidine, carafate down to 1 tab daily.    Past Medical History:  Diagnosis Date  . Allergy    enviromental  . Anxiety   . Arthritis    osteoarthritis  . COPD (chronic obstructive pulmonary disease) (HCC)    mild  . Diabetes mellitus without complication ([place])    controlled by diet  . Environmental allergies   . GERD (gastroesophageal reflux disease)   . Hepatitis    hepatitis A  . History of colon polyps   . Hyperlipidemia   . Hypertension   . IBS (irritable bowel syndrome)   . Mixed hyperlipidemia 06/15/2013  . PONV (postoperative nausea and vomiting)    second surgery no problems last surgery  . Solitary pulmonary nodule 06/02/2019  . Urgency of urination    Takes oxybutynin; under control at this time     Allergies  Allergen Reactions  . Enalapril Swelling    Caused lips to swell and bloating.  Maryelizabeth Kaufmann [Evolocumab]  Stated she didn't tolerate it and switched to Praluent    Current Outpatient Medications on File Prior to Visit  Medication Sig  . Alirocumab (PRALUENT) 150 MG/ML SOAJ Inject 150 mg  into the skin every 14 (fourteen) days.  . Ascorbic Acid (VITAMIN C) 1000 MG tablet Take 1,000 mg by mouth daily.  Marland Kitchen aspirin 81 MG tablet Take 81 mg by mouth every other day.   . bisoprolol-hydrochlorothiazide (ZIAC) 10-6.25 MG tablet Take 0.5 tablets by mouth daily.  Marland Kitchen buPROPion (WELLBUTRIN XL) 300 MG 24 hr tablet Take 1 tablet (300 mg total) by mouth daily.  . Cholecalciferol (VITAMIN D3) 5000 UNITS TABS Take 5,000 Units by mouth daily.  . clonazePAM (KLONOPIN) 0.5 MG tablet Take 1/2 to 1 tablet as needed  . Cyanocobalamin (VITAMIN B12 PO) Take 1,000 mcg by mouth daily.  Marland Kitchen dextromethorphan-guaiFENesin (MUCINEX DM) 30-600 MG 12hr tablet Take 1 tablet by mouth as needed.   . fluticasone (FLONASE) 50 MCG/ACT nasal spray 1 Spray each nostril daily  . losartan (COZAAR) 100 MG tablet Take 1 tablet Daily for BP  . methylcellulose oral powder Take by mouth daily.   . metroNIDAZOLE (FLAGYL) 500 MG tablet Take 1 tablet (500 mg total) by mouth 3 (three) times daily.  . montelukast (SINGULAIR) 10 MG tablet TAKE 1 TABLET(10 MG) BY MOUTH DAILY  . Multiple Vitamins-Minerals (MULTI COMPLETE PO) Take 1 capsule by mouth daily.  Marland Kitchen NIFEdipine (PROCARDIA XL/NIFEDICAL XL) 60 MG 24 hr tablet Take 120 mg by mouth daily.  . Omega-3 Fatty Acids (FISH OIL) 1000 MG CAPS Take 1 capsule (1,000 mg total) by mouth 2 (two) times daily with a meal.  . omeprazole (PRILOSEC) 40 MG capsule Take 1 capsule daily for Indigestion & Heartburn  . sucralfate (CARAFATE) 1 g tablet TAKE 1 TABLET(1 GRAM) BY MOUTH FOUR TIMES DAILY  . Tiotropium Bromide Monohydrate (SPIRIVA RESPIMAT) 1.25 MCG/ACT AERS Inhale 2 puffs into the lungs daily.  . Zinc 50 MG TABS Take by mouth daily.   No current facility-administered medications on file prior to visit.    ROS: all negative except above.   Physical Exam:  BP 110/62   Pulse 74   Temp (!) 96.8 F (36 C)   Wt 169 lb (76.7 kg)   SpO2 97%   BMI 28.12 kg/m   General Appearance: Well  nourished, in no apparent distress. Eyes: PERRLA, EOMs, conjunctiva no swelling or erythema Sinuses: No Frontal/maxillary tenderness ENT/Mouth: Ext aud canals clear, TMs without erythema, bulging. No erythema, swelling, or exudate on post pharynx.  Tonsils not swollen or erythematous. Dental hygiene is fair, no abscess noted. Hearing normal. Chronic hoarse vocal quality .  Neck: Supple, thyroid normal.  Respiratory: Respiratory effort normal, BS equal bilaterally without rales, rhonchi, wheezing or stridor.  Cardio: RRR with no MRGs. Brisk peripheral pulses without edema.  Abdomen: Soft, + BS.  No tenderness, no guarding, rebound, hernias, masses. Lymphatics: Non tender without lymphadenopathy.  Musculoskeletal: No obvious deformity, normal gait.  Skin: Warm, dry without rashes, lesions, ecchymosis.  Neuro: Normal muscle tone Psych: Awake and oriented X 3, normal affect, Insight and Judgment appropriate.     Izora Ribas, NP 3:08 PM [facility]

## 2019-10-29 NOTE — Patient Instructions (Signed)
Fiber fueled - Dr. Anabel Bene   [place], bananas - relaxing distal esophageal sphicter    High-Fiber Diet Fiber, also called dietary fiber, is a type of carbohydrate that is found in fruits, vegetables, whole grains, and beans. A high-fiber diet can have many health benefits. Your health care provider may recommend a high-fiber diet to help:  Prevent constipation. Fiber can make your bowel movements more regular.  Lower your cholesterol.  Relieve the following conditions: ? Swelling of veins in the anus (hemorrhoids). ? Swelling and irritation (inflammation) of specific areas of the digestive tract (uncomplicated diverticulosis). ? A problem of the large intestine (colon) that sometimes causes pain and diarrhea (irritable bowel syndrome, IBS).  Prevent overeating as part of a weight-loss plan.  Prevent heart disease, type 2 diabetes, and certain cancers. What is my plan? The recommended daily fiber intake in grams (g) includes:  38 g for men age 44 or younger.  30 g for men over age 50.  60 g for women age 25 or younger.  21 g for women over age 37. You can get the recommended daily intake of dietary fiber by:  Eating a variety of fruits, vegetables, grains, and beans.  Taking a fiber supplement, if it is not possible to get enough fiber through your diet. What do I need to know about a high-fiber diet?  It is better to get fiber through food sources rather than from fiber supplements. There is not a lot of research about how effective supplements are.  Always check the fiber content on the nutrition facts label of any prepackaged food. Look for foods that contain 5 g of fiber or more per serving.  Talk with a diet and nutrition specialist (dietitian) if you have questions about specific foods that are recommended or not recommended for your medical condition, especially if those foods are not listed below.  Gradually increase how much fiber you consume. If you  increase your intake of dietary fiber too quickly, you may have bloating, cramping, or gas.  Drink plenty of water. Water helps you to digest fiber. What are tips for following this plan?  Eat a wide variety of high-fiber foods.  Make sure that half of the grains that you eat each day are whole grains.  Eat breads and cereals that are made with whole-grain flour instead of refined flour or white flour.  Eat brown rice, bulgur wheat, or millet instead of white rice.  Start the day with a breakfast that is high in fiber, such as a cereal that contains 5 g of fiber or more per serving.  Use beans in place of meat in soups, salads, and pasta dishes.  Eat high-fiber snacks, such as berries, raw vegetables, nuts, and popcorn.  Choose whole fruits and vegetables instead of processed forms like juice or sauce. What foods can I eat?  Fruits Berries. Pears. Apples. Oranges. Avocado. Prunes and raisins. Dried figs. Vegetables Sweet potatoes. Spinach. Kale. Artichokes. Cabbage. Broccoli. Cauliflower. Green peas. Carrots. Squash. Grains Whole-grain breads. Multigrain cereal. Oats and oatmeal. Brown rice. Barley. Bulgur wheat. [place]. Quinoa. Bran muffins. Popcorn. Rye wafer crackers. Meats and other proteins Navy, kidney, and pinto beans. Soybeans. Split peas. Lentils. Nuts and seeds. Dairy Fiber-fortified yogurt. Beverages Fiber-fortified soy milk. Fiber-fortified orange juice. Other foods Fiber bars. The items listed above may not be a complete list of recommended foods and beverages. Contact a dietitian for more options. What foods are not recommended? Fruits Fruit juice. Cooked, strained fruit. Vegetables  Fried potatoes. Canned vegetables. Well-cooked vegetables. Grains White bread. Pasta made with refined flour. White rice. Meats and other proteins Fatty cuts of meat. Fried chicken or fried fish. Dairy Milk. Yogurt. Cream cheese. Sour cream. Fats and  oils Butters. Beverages Soft drinks. Other foods Cakes and pastries. The items listed above may not be a complete list of foods and beverages to avoid. Contact a dietitian for more information. Summary  Fiber is a type of carbohydrate. It is found in fruits, vegetables, whole grains, and beans.  There are many health benefits of eating a high-fiber diet, such as preventing constipation, lowering blood cholesterol, helping with weight loss, and reducing your risk of heart disease, diabetes, and certain cancers.  Gradually increase your intake of fiber. Increasing too fast can result in cramping, bloating, and gas. Drink plenty of water while you increase your fiber.  The best sources of fiber include whole fruits and vegetables, whole grains, nuts, seeds, and beans. This information is not intended to replace advice given to you by your health care provider. Make sure you discuss any questions you have with your health care provider. Document Revised: 05/06/2017 Document Reviewed: 05/06/2017 Elsevier Patient Education  2020 [employer].

## 2019-11-06 ENCOUNTER — Telehealth: Payer: Self-pay | Admitting: Pharmacist

## 2019-11-06 NOTE — Telephone Encounter (Signed)
Called patient on 11/06/2019 at 12:55 PM and left HIPAA-compliant VM with instructions to call [place] [facility] back   Plan to discuss smoking cessation. 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 Will await returned phone call at this time.  Thank you for involving pharmacy to assist in providing this patient's care.   Drexel Iha, PharmD PGY2 Ambulatory Care Pharmacy Resident

## 2019-11-06 NOTE — Telephone Encounter (Signed)
Thank you for attempting to work with the patient.  [name]

## 2019-11-08 ENCOUNTER — Encounter: Payer: Self-pay | Admitting: Internal Medicine

## 2019-11-08 NOTE — Patient Instructions (Signed)

## 2019-11-08 NOTE — Progress Notes (Signed)
History of Present Illness:        This very nice 69 y.o. married [place] lady  presents for 3 month follow up with HTN, HLD, Pre-Diabetes and Vitamin D Deficiency. Patient has GERD controlled on her meds.  She has also been followed by Dr Lamonte Sakai re: her COPD      Patient is treated for HTN & BP has been controlled at home. Today's BP is at goal - 134/66. Patient has had no complaints of any cardiac type chest pain, palpitations, dyspnea / orthopnea / PND, dizziness, claudication, or dependent edema.      Patient is  Statin intolerance and hyperlipidemia is improved, but with diet PCSK9i / Praulent  thru the [facility].  Last Lipids were at goal except elevated Trig's:  Lab Results  Component Value Date   CHOL 195 08/10/2019   HDL 78 08/10/2019   LDLCALC 87 08/10/2019   TRIG 207 (H) 08/10/2019   CHOLHDL 2.5 08/10/2019    Also, the patient has history of PreDiabetes (A1c 5.7% / 2017) and has had no symptoms of reactive hypoglycemia, diabetic polys, paresthesias or visual blurring.  Last A1c was Normal & at goal:  Lab Results  Component Value Date   HGBA1C 5.2 02/04/2019       Further, the patient also has history of Vitamin D Deficiency and supplements vitamin D without any suspected side-effects. Last vitamin D was at goal:  Lab Results  Component Value Date   VD25OH 71 02/04/2019    Current Outpatient Medications on File Prior to Visit  Medication Sig  . Alirocumab (PRALUENT) 150 MG/ML SOAJ Inject 150 mg into the skin every 14 (fourteen) days.  . Ascorbic Acid (VITAMIN C) 1000 MG tablet Take 1,000 mg by mouth daily.  Marland Kitchen aspirin 81 MG tablet Take 81 mg by mouth every other day.   . bisoprolol-hydrochlorothiazide (ZIAC) 10-6.25 MG tablet Take 0.5 tablets by mouth daily.  Marland Kitchen buPROPion (WELLBUTRIN XL) 300 MG 24 hr tablet Take 1 tablet (300 mg total) by mouth daily.  . Cholecalciferol (VITAMIN D3) 5000 UNITS TABS Take 5,000 Units by mouth daily.  . clonazePAM  (KLONOPIN) 0.5 MG tablet Take 1/2 to 1 tablet as needed  . Cyanocobalamin (VITAMIN B12 PO) Take 1,000 mcg by mouth daily.  Marland Kitchen dextromethorphan-guaiFENesin (MUCINEX DM) 30-600 MG 12hr tablet Take 1 tablet by mouth as needed.   . fluticasone (FLONASE) 50 MCG/ACT nasal spray 1 Spray each nostril daily  . losartan (COZAAR) 100 MG tablet Take 1 tablet Daily for BP  . methylcellulose oral powder Take by mouth daily.   . montelukast (SINGULAIR) 10 MG tablet TAKE 1 TABLET(10 MG) BY MOUTH DAILY  . Multiple Vitamins-Minerals (MULTI COMPLETE PO) Take 1 capsule by mouth daily.  Marland Kitchen NIFEdipine (PROCARDIA XL/NIFEDICAL XL) 60 MG 24 hr tablet Take 120 mg by mouth daily.  . Omega-3 Fatty Acids (FISH OIL) 1000 MG CAPS Take 1 capsule (1,000 mg total) by mouth 2 (two) times daily with a meal.  . omeprazole (PRILOSEC) 40 MG capsule Take 1 capsule daily for Indigestion & Heartburn  . Tiotropium Bromide Monohydrate (SPIRIVA RESPIMAT) 1.25 MCG/ACT AERS Inhale 2 puffs into the lungs daily.  . Zinc 50 MG TABS Take by mouth daily.   No current facility-administered medications on file prior to visit.    Allergies  Allergen Reactions  . Enalapril Swelling    Caused lips to swell and bloating.  Maryelizabeth Kaufmann [Evolocumab]     Stated  she didn't tolerate it and switched to Praluent    PMHx:   Past Medical History:  Diagnosis Date  . Allergy    enviromental  . Anxiety   . Arthritis    osteoarthritis  . COPD (chronic obstructive pulmonary disease) (HCC)    mild  . Diabetes mellitus without complication ([place])    controlled by diet  . Environmental allergies   . GERD (gastroesophageal reflux disease)   . Hepatitis    hepatitis A  . History of colon polyps   . Hyperlipidemia   . Hypertension   . IBS (irritable bowel syndrome)   . Mixed hyperlipidemia 06/15/2013  . PONV (postoperative nausea and vomiting)    second surgery no problems last surgery  . Solitary pulmonary nodule 06/02/2019  . Urgency of urination      Takes oxybutynin; under control at this time    Immunization History  Administered Date(s) Administered  . Influenza, High Dose Seasonal PF 04/10/2017, 05/08/2019  . Influenza-Unspecified 03/05/2018  . PFIZER SARS-COV-2 Vaccination 08/13/2019, 09/09/2019  . PPD Test 09/21/2014  . Pneumococcal Conjugate-13 10/22/2017  . Pneumococcal Polysaccharide-23 02/07/2011, 01/26/2016  . Tdap 02/07/2011    Past Surgical History:  Procedure Laterality Date  . APPENDECTOMY     removed as child  . BACK SURGERY     lumbar fusion  . CATARACT EXTRACTION Left 2019  . COLONOSCOPY    . DILATION AND CURETTAGE OF UTERUS    . LUMBAR FUSION  02/06/2012   L 4  L5  . PARTIAL HYSTERECTOMY    . POLYPECTOMY    . TOTAL HIP ARTHROPLASTY  06/02/2012   Procedure: TOTAL HIP ARTHROPLASTY;  Surgeon: Kerin Salen, MD;  Location: [place];  Service: Orthopedics;  Laterality: Right;  . TOTAL HIP ARTHROPLASTY Left 01/25/2016  . TOTAL HIP ARTHROPLASTY Left 01/25/2016   Procedure: LEFT TOTAL HIP ARTHROPLASTY;  Surgeon: Frederik Pear, MD;  Location: [place];  Service: Orthopedics;  Laterality: Left;  . TOTAL KNEE ARTHROPLASTY Right 12/15/2012   Procedure: TOTAL KNEE ARTHROPLASTY;  Surgeon: Kerin Salen, MD;  Location: [place];  Service: Orthopedics;  Laterality: Right;  . TOTAL KNEE ARTHROPLASTY Left 06/18/2016   Procedure: TOTAL KNEE ARTHROPLASTY;  Surgeon: Frederik Pear, MD;  Location: [place];  Service: Orthopedics;  Laterality: Left;  . UPPER GASTROINTESTINAL ENDOSCOPY  05/06/2019    FHx:    Reviewed / unchanged  SHx:    Reviewed / unchanged   Systems Review:  Constitutional: Denies fever, chills, wt changes, headaches, insomnia, fatigue, night sweats, change in appetite. Eyes: Denies redness, blurred vision, diplopia, discharge, itchy, watery eyes.  ENT: Denies discharge, congestion, post nasal drip, epistaxis, sore throat, earache, hearing loss, dental pain, tinnitus, vertigo, sinus pain, snoring.  CV: Denies chest  pain, palpitations, irregular heartbeat, syncope, dyspnea, diaphoresis, orthopnea, PND, claudication or edema. Respiratory: denies cough, dyspnea, DOE, pleurisy, hoarseness, laryngitis, wheezing.  Gastrointestinal: Denies dysphagia, odynophagia, heartburn, reflux, water brash, abdominal pain or cramps, nausea, vomiting, bloating, diarrhea, constipation, hematemesis, melena, hematochezia  or hemorrhoids. Genitourinary: Denies dysuria, frequency, urgency, nocturia, hesitancy, discharge, hematuria or flank pain. Musculoskeletal: Denies arthralgias, myalgias, stiffness, jt. swelling, pain, limping or strain/sprain.  Skin: Denies pruritus, rash, hives, warts, acne, eczema or change in skin lesion(s). Neuro: No weakness, tremor, incoordination, spasms, paresthesia or pain. Psychiatric: Denies confusion, memory loss or sensory loss. Endo: Denies change in weight, skin or hair change.  Heme/Lymph: No excessive bleeding, bruising or enlarged lymph nodes.  Physical Exam  BP 134/66   Pulse  72   Temp (!) 97.1 F (36.2 C)   Resp 16   Ht 5\' 5"  (1.651 m)   Wt 166 lb (75.3 kg)   BMI 27.62 kg/m   Appears  well nourished, well groomed  and in no distress.  Eyes: PERRLA, EOMs, conjunctiva no swelling or erythema. Sinuses: No frontal/maxillary tenderness ENT/Mouth: EAC's clear, TM's nl w/o erythema, bulging. Nares clear w/o erythema, swelling, exudates. Oropharynx clear without erythema or exudates. Oral hygiene is good. Tongue normal, non obstructing. Hearing intact.  Neck: Supple. Thyroid not palpable. Car 2+/2+ without bruits, nodes or JVD. Chest: Respirations nl with BS clear & equal w/o rales, rhonchi, wheezing or stridor.  Cor: Heart sounds normal w/ regular rate and rhythm without sig. murmurs, gallops, clicks or rubs. Peripheral pulses normal and equal  without edema.  Abdomen: Soft & bowel sounds normal. Non-tender w/o guarding, rebound, hernias, masses or organomegaly.  Lymphatics:  Unremarkable.  Musculoskeletal: Full ROM all peripheral extremities, joint stability, 5/5 strength and normal gait.  Skin: Warm, dry without exposed rashes, lesions or ecchymosis apparent.  Neuro: Cranial nerves intact, reflexes equal bilaterally. Sensory-motor testing grossly intact. Tendon reflexes grossly intact.  Pysch: Alert & oriented x 3.  Insight and judgement nl & appropriate. No ideations.  Assessment and Plan:  1. Essential hypertension  - Continue medication, monitor blood pressure at home.  - Continue DASH diet.  Reminder to go to the ER if any CP,  SOB, nausea, dizziness, severe HA, changes vision/speech.  - CBC with Differential/Platelet - COMPLETE METABOLIC PANEL WITH GFR - Magnesium - TSH  2. Hyperlipidemia, mixed  - Continue diet/meds, exercise,& lifestyle modifications.  - Continue monitor periodic cholesterol/liver & renal functions   - Lipid panel - TSH  3. Abnormal glucose  - Continue diet, exercise  - Lifestyle modifications.  - Monitor appropriate labs  - Insulin, random - Hemoglobin A1c  4. Vitamin D deficiency  - Continue supplementation.  - VITAMIN D 25 Hydroxy   5. Gastroesophageal reflux disease  - CBC with Differential/Platelet  6. Chronic obstructive pulmonary disease(HCC)  7. Medication management  - CBC with Differential/Platelet - COMPLETE METABOLIC PANEL WITH GFR - Magnesium - Lipid panel - TSH - Insulin, random - VITAMIN D 25 Hydroxy         Discussed  regular exercise, BP monitoring, weight control to achieve/maintain BMI less than 25 and discussed med and SE's. Recommended labs to assess and monitor clinical status with further disposition pending results of labs.  I discussed the assessment and treatment plan with the patient. The patient was provided an opportunity to ask questions and all were answered. The patient agreed with the plan and demonstrated an understanding of the instructions.  I provided over 30 minutes  of exam, counseling, chart review and  complex critical decision making.   Kirtland Bouchard, MD

## 2019-11-09 ENCOUNTER — Ambulatory Visit (INDEPENDENT_AMBULATORY_CARE_PROVIDER_SITE_OTHER): Payer: PPO | Admitting: Internal Medicine

## 2019-11-09 ENCOUNTER — Other Ambulatory Visit: Payer: Self-pay

## 2019-11-09 VITALS — BP 134/66 | HR 72 | Temp 97.1°F | Resp 16 | Ht 65.0 in | Wt 166.0 lb

## 2019-11-09 DIAGNOSIS — I10 Essential (primary) hypertension: Secondary | ICD-10-CM | POA: Diagnosis not present

## 2019-11-09 DIAGNOSIS — K219 Gastro-esophageal reflux disease without esophagitis: Secondary | ICD-10-CM | POA: Diagnosis not present

## 2019-11-09 DIAGNOSIS — R7309 Other abnormal glucose: Secondary | ICD-10-CM | POA: Diagnosis not present

## 2019-11-09 DIAGNOSIS — Z6832 Body mass index (BMI) 32.0-32.9, adult: Secondary | ICD-10-CM | POA: Diagnosis not present

## 2019-11-09 DIAGNOSIS — E559 Vitamin D deficiency, unspecified: Secondary | ICD-10-CM | POA: Diagnosis not present

## 2019-11-09 DIAGNOSIS — E6609 Other obesity due to excess calories: Secondary | ICD-10-CM

## 2019-11-09 DIAGNOSIS — J449 Chronic obstructive pulmonary disease, unspecified: Secondary | ICD-10-CM

## 2019-11-09 DIAGNOSIS — E782 Mixed hyperlipidemia: Secondary | ICD-10-CM | POA: Diagnosis not present

## 2019-11-09 DIAGNOSIS — Z79899 Other long term (current) drug therapy: Secondary | ICD-10-CM | POA: Diagnosis not present

## 2019-11-09 MED ORDER — TOPIRAMATE 50 MG PO TABS: ORAL_TABLET | ORAL | 1 refills | Status: DC

## 2019-11-09 MED ORDER — PHENTERMINE HCL 37.5 MG PO TABS: ORAL_TABLET | ORAL | 1 refills | Status: DC

## 2019-11-10 LAB — LIPID PANEL
Cholesterol: 194 mg/dL (ref <200)
HDL: 85 mg/dL (ref >=50)
LDL Cholesterol (Calc): 89 mg/dL (calc)
Non-HDL Cholesterol (Calc): 109 mg/dL (calc) (ref <130)
Total CHOL/HDL Ratio: 2.3 (calc) (ref <5.0)
Triglycerides: 107 mg/dL (ref <150)

## 2019-11-10 LAB — HEMOGLOBIN A1C
Hgb A1c MFr Bld: 5.4 % of total Hgb (ref <5.7)
Mean Plasma Glucose: 108 (calc)
eAG (mmol/L): 6 (calc)

## 2019-11-10 LAB — CBC WITH DIFFERENTIAL/PLATELET
Absolute Monocytes: 508 cells/uL (ref 200–950)
Basophils Absolute: 31 cells/uL (ref 0–200)
Basophils Relative: 0.5 %
Eosinophils Absolute: 81 cells/uL (ref 15–500)
Eosinophils Relative: 1.3 %
HCT: 41.8 % (ref 35.0–45.0)
Hemoglobin: 13.8 g/dL (ref 11.7–15.5)
Lymphs Abs: 2145 cells/uL (ref 850–3900)
MCH: 27.1 pg (ref 27.0–33.0)
MCHC: 33 g/dL (ref 32.0–36.0)
MCV: 82.1 fL (ref 80.0–100.0)
MPV: 10.5 fL (ref 7.5–12.5)
Monocytes Relative: 8.2 %
Neutro Abs: 3435 cells/uL (ref 1500–7800)
Neutrophils Relative %: 55.4 %
Platelets: 276 10*3/uL (ref 140–400)
RBC: 5.09 10*6/uL (ref 3.80–5.10)
RDW: 13.6 % (ref 11.0–15.0)
Total Lymphocyte: 34.6 %
WBC: 6.2 10*3/uL (ref 3.8–10.8)

## 2019-11-10 LAB — COMPLETE METABOLIC PANEL WITH GFR
AG Ratio: 1.6 (calc) (ref 1.0–2.5)
ALT: 12 U/L (ref 6–29)
AST: 19 U/L (ref 10–35)
Albumin: 4.4 g/dL (ref 3.6–5.1)
Alkaline phosphatase (APISO): 74 U/L (ref 37–153)
BUN: 14 mg/dL (ref 7–25)
CO2: 28 mmol/L (ref 20–32)
Calcium: 9.7 mg/dL (ref 8.6–10.4)
Chloride: 102 mmol/L (ref 98–110)
Creat: 0.73 mg/dL (ref 0.50–0.99)
GFR, Est African American: 97 mL/min/{1.73_m2} (ref >=60)
GFR, Est Non African American: 84 mL/min/{1.73_m2} (ref >=60)
Globulin: 2.7 g/dL (calc) (ref 1.9–3.7)
Glucose, Bld: 112 mg/dL — ABNORMAL HIGH (ref 65–99)
Potassium: 4.6 mmol/L (ref 3.5–5.3)
Sodium: 138 mmol/L (ref 135–146)
Total Bilirubin: 0.6 mg/dL (ref 0.2–1.2)
Total Protein: 7.1 g/dL (ref 6.1–8.1)

## 2019-11-10 LAB — INSULIN, RANDOM: Insulin: 7.7 u[IU]/mL

## 2019-11-10 LAB — TSH: TSH: 0.89 mIU/L (ref 0.40–4.50)

## 2019-11-10 LAB — MAGNESIUM: Magnesium: 2 mg/dL (ref 1.5–2.5)

## 2019-11-10 LAB — VITAMIN D 25 HYDROXY (VIT D DEFICIENCY, FRACTURES): Vit D, 25-Hydroxy: 59 ng/mL (ref 30–100)

## 2019-11-30 DIAGNOSIS — Z1231 Encounter for screening mammogram for malignant neoplasm of breast: Secondary | ICD-10-CM | POA: Diagnosis not present

## 2019-11-30 DIAGNOSIS — N958 Other specified menopausal and perimenopausal disorders: Secondary | ICD-10-CM | POA: Diagnosis not present

## 2019-11-30 DIAGNOSIS — Z01419 Encounter for gynecological examination (general) (routine) without abnormal findings: Secondary | ICD-10-CM | POA: Diagnosis not present

## 2019-11-30 DIAGNOSIS — Z6827 Body mass index (BMI) 27.0-27.9, adult: Secondary | ICD-10-CM | POA: Diagnosis not present

## 2019-11-30 DIAGNOSIS — M8588 Other specified disorders of bone density and structure, other site: Secondary | ICD-10-CM | POA: Diagnosis not present

## 2019-12-09 DIAGNOSIS — H439 Unspecified disorder of vitreous body: Secondary | ICD-10-CM | POA: Diagnosis not present

## 2019-12-10 ENCOUNTER — Telehealth: Payer: Self-pay | Admitting: Cardiology

## 2019-12-10 DIAGNOSIS — H349 Unspecified retinal vascular occlusion: Secondary | ICD-10-CM

## 2019-12-10 DIAGNOSIS — H34232 Retinal artery branch occlusion, left eye: Secondary | ICD-10-CM | POA: Diagnosis not present

## 2019-12-10 NOTE — Progress Notes (Signed)
History of Present Illness:     This is a very nice 69 y.o. married [place] lady with HTN, HLD, Pre-Diabetes,  GERD,  COPD and Vitamin D Deficiency.  She presents for  follow up  Referred by her optometrist, Dr Jenne Campus, OD and Dr Dennie Fetters, MD for  for concern of  Possible Retinal Artery / embolus.Patient presented with new onset "blurry vision" in the temporal Margins of the the Left eye. Apparently dilated retinal exam and retinal photographs confirmed a retinal artery branch occlusion of the Left eye and patient was referred for 2D echocardiogram and carotid U/s, both of which have been ordered by Dr  Jacalyn Lefevre office. Also, patient relates having stopped smoking about 90 days ago.       Patient is  Statin intolerance andshe is on PCSK9i / Praulent  thru the [facility] with recent Lipids were at goal  T Chol 194 and LDL 89 - April 2021).     Also, the patient has history of PreDiabetes (A1c 5.7% / 2017) and  last A1c was Normal (5.4% in April 2021)  & at goal:  Medications  .  Alirocumab (PRALUENT) 150 MG, Inject 150 mg into the skin every 14  days. .  bisoprolol-hydrochlorothiazide 10-6.25 MG tablet, Take 0.5 tablets daily. Marland Kitchen  losartan ( 100 MG tablet, Take 1 tablet Daily for BP .  NIFEdipine  XL 60 MG 24 hr tablet, Take 120 mg  daily. Marland Kitchen  MUCINEX DM 30-600 MG 12hr tablet, Take 1 tabletas needed.  .  fluticasone  50 MCG/ACT nasal spray, 1 Spray each nostril daily .  montelukast10 MG tablet, TAKE 10 MG DAILY .  SPIRIVA RESPIMAT 1.25 MCG, Inhale 2 puffs into the lungs daily. Marland Kitchen  aspirin 81 MG tablet, Take 81 mg every other day.  Marland Kitchen  VITAMIN B12 tab , Take 1,000 mcg  daily. Marland Kitchen  VITAMIN C1000 MG tablet, Take 1,000 mg  daily. Marland Kitchen  buPROPion-XL)  300 MG 24 hr tablet, Take 1 tablet  daily. Marland Kitchen  VITAMIN D  5000 UNITS , Take 5,000 Units daily. Marland Kitchen  KLONOPIN 0.5 MG tablet, Take 1/2 to 1 tablet as needed .  methylcellulose oral powder, Take  daily.  .  Multiple Vitamins-Minerals  , Take 1 capsule daily. .  Omega-3 Fatty Acids  1000 MG CAPS, Take 1 capsule  2  times daily with a meal. .  omeprazole  40 MG capsule, Take 1 capsule daily for Indigestion & Heartburn .  phentermine  37.5 MG tablet, Take 1/2 to 1 tablet every Morning  .  topiramate ( 50 MG tablet, Take 1/2 to 1 tablet 2 x /day at Suppertime & Bedtime .  Zinc 50 MG TABS, Take   daily.  Problem list She has Essential hypertension; Hyperlipidemia, mixed; Vitamin D deficiency; Other abnormal glucose; Medication management; GERD (gastroesophageal reflux disease); Aortic atherosclerosis by Abd CT scan [facility]); Anxiety; Current smoker; COPD GOLD 0 ; Chronic cough; and OSA (obstructive sleep apnea) on their problem list.   Observations/Objective:   BP 130/76   Pulse 88   Temp 97.6 F (36.4 C)   Resp 16   Ht 5\' 6"  (1.676 m)   Wt 168 lb 3.2 oz (76.3 kg)   BMI 27.15 kg/m   HEENT - WNL. Neck - supple.  Chest - Clear equal BS. Cor - Nl HS. RRR w/o sig MGR. PP 1(+). No edema. MS- FROM w/o deformities.  Gait Nl. Neuro -  Nl w/o focal abnormalities.  Assessment and Plan:  1. Retinal arterial branch occlusion, left  - Advised increase her bASA 81 mg from 3 x /week back to Daily   - Adding clopidogrel (PLAVIX) 75 MG tablet; Take 1 tablet Daily to Prevent Blood Clots  Dispense: 90 tablet; Refill: 1  2. Essential hypertension  3. Hyperlipidemia, mixed  4. Former smoker  Follow Up Instructions:       I discussed the assessment and treatment plan with the patient. The patient was provided an opportunity to ask questions and all were answered. The patient agreed with the plan and demonstrated an understanding of the instructions.        The patient was advised to call back or seek an in-person evaluation if the symptoms worsen or if the condition fails to improve as anticipated.   Kirtland Bouchard, MD

## 2019-12-10 NOTE — Telephone Encounter (Signed)
Schedule carotid dopplers and echo [facility]

## 2019-12-10 NOTE — Telephone Encounter (Signed)
Will forward to Dr Stanford Breed for review .Adonis Housekeeper

## 2019-12-10 NOTE — Telephone Encounter (Signed)
° °  Pt said she went to see her eye doctor and they saw a blocked blood vessels on her left eye. She was told this can cause a heart a attack and she needs to get echo as soon as possible. She also wanted to speak with a nurse to discuss  Please call

## 2019-12-11 ENCOUNTER — Ambulatory Visit (HOSPITAL_COMMUNITY): Payer: PPO | Attending: Cardiovascular Disease

## 2019-12-11 ENCOUNTER — Other Ambulatory Visit: Payer: Self-pay

## 2019-12-11 ENCOUNTER — Ambulatory Visit (INDEPENDENT_AMBULATORY_CARE_PROVIDER_SITE_OTHER): Payer: PPO | Admitting: Internal Medicine

## 2019-12-11 VITALS — BP 130/76 | HR 88 | Temp 97.6°F | Resp 16 | Ht 66.0 in | Wt 168.2 lb

## 2019-12-11 DIAGNOSIS — M609 Myositis, unspecified: Secondary | ICD-10-CM | POA: Diagnosis not present

## 2019-12-11 DIAGNOSIS — Z87891 Personal history of nicotine dependence: Secondary | ICD-10-CM

## 2019-12-11 DIAGNOSIS — H34239 Retinal artery branch occlusion, unspecified eye: Secondary | ICD-10-CM | POA: Diagnosis not present

## 2019-12-11 DIAGNOSIS — H349 Unspecified retinal vascular occlusion: Secondary | ICD-10-CM | POA: Insufficient documentation

## 2019-12-11 DIAGNOSIS — E782 Mixed hyperlipidemia: Secondary | ICD-10-CM | POA: Diagnosis not present

## 2019-12-11 DIAGNOSIS — I10 Essential (primary) hypertension: Secondary | ICD-10-CM

## 2019-12-11 LAB — ECHOCARDIOGRAM COMPLETE
Height: 66 in
Weight: 2691.2 oz

## 2019-12-11 MED ORDER — CLOPIDOGREL BISULFATE 75 MG PO TABS: ORAL_TABLET | ORAL | 1 refills | Status: DC

## 2019-12-11 NOTE — Patient Instructions (Signed)
Central Retinal Artery Occlusion, Adult  Central retinal artery occlusion (CRAO) is a blockage in the main blood vessel that carries blood to the retina (central retinal artery). The retina is the part of your eye that senses light and sends signals to the brain that allow you to see. CRAO can make you lose some or all of your vision. If the condition is not treated within 90 minutes, it can result in permanent vision loss in the affected eye. What are the causes? This condition may be caused by:  A blood clot that forms in the artery (thrombus). A clot is blood that has thickened into a gel or solid.  A collection of blood or solid material, such as fat that forms somewhere else and travels to the artery (embolus).  A disease that causes the artery to swell (vasculitis).  An infection of the heart's inner lining (endocarditis).  An injury to the artery. Sometimes the cause is not known. What increases the risk? This condition is more likely to develop in:  Males.  Adults who are age 35 or older.  Women who take birth control pills.  Pregnant women.  People who have certain medical conditions, including: ? A blood vessel disease that causes narrowing of the arteries (atherosclerosis). ? High levels of fat in the blood (hyperlipidemia). ? Giant cell arteritis. ? Diabetes. ? Heart disease. ? Sickle cell disease. ? High blood pressure (hypertension). ? A heart rhythm problem (atrial fibrillation). ? A heart valve condition. ? A blood-clotting disorder (coagulopathy). ? Inflammation of blood vessels (vasculitis). What are the signs or symptoms? The only symptom of this condition is sudden and painless vision loss. How is this diagnosed? This condition is usually diagnosed by a health care provider who specializes in eye diseases (ophthalmologist). To diagnose the condition, the ophthalmologist will do an eye exam. She or he may also:  Do an exam that involves having eye drops  put in the eye (slit lamp exam or dilated fundus exam).  Order tests, including: ? A vision test. ? A measurement of the pressure inside your eye. ? A test that checks the nerves in your retina (electroretinogram). ? A fluorescein angiogram. In this test, pictures are taken of your retina after a dye is injected into your bloodstream. ? Optical coherence tomography. In this test, light waves are used to create pictures of your retina.  Check your blood pressure.  Order other tests to find the cause of the condition. These tests may include: ? Blood tests to check for vasculitis or coagulopathy. ? An ultrasound to check for heart disease or atherosclerosis. How is this treated? Treatment for this condition may include:  Massaging the eye.  Medicines, such as steroids to treat any underlying inflammation.  Breathing in a mixture of oxygen and carbon dioxide. This widens (dilates) the arteries of your retina.  Having fluid removed from the front of your eye.  Using drops to reduce pressure in your eye.  An injection of a clot-busting medicine. You may be referred to a specialist or receive additional treatment if any of these apply:  Your condition was caused by a blood clot that formed outside your eye.  Your optic nerve is swollen.  You have symptoms of a condition called giant cell arteritis. Symptoms of this condition include: ? Headache. ? Pain with chewing. ? Scalp tenderness. ? Recent poor appetite and weight loss. Follow these instructions at home:  Use over-the-counter and prescription medicines only as told by your health  care provider. These include any eye drops.  If you lose some vision again: ? Breathe into a paper bag. ? Massage your eye as directed. ? Get medical care right away.  Keep all follow-up visits as told by your health care provider. This is important. How is this prevented? To help prevent this condition:  Work with your health care  providers to reduce your risk factors.  Do not use any products that contain nicotine or tobacco, such as cigarettes and e-cigarettes. If you need help quitting, ask your health care provider.  Maintain a healthy weight.  Follow a heart-healthy diet. This diet includes a lot of fruits, vegetables, grains, and lean protein. It is low in saturated fat and sugar.  Exercise regularly. Contact a health care provider if:  You have any changes in your vision. Get help right away if:  You have eye pain.  You have new or sudden vision loss. Summary  Central retinal artery occlusion (CRAO) is a blockage in the main blood vessel that carries blood to the retina (central retinal artery). The retina is the part of your eye that senses light and sends signals to the brain that allow you to see.  CRAO can make you lose some or all of your vision. If the condition is not treated within 90 minutes, it can result in permanent vision loss in the affected eye.  Treatment for this condition may include medicines, eye massage, eye drops, and certain procedures.  Get help right away if you have new or sudden vision loss. This information is not intended to replace advice given to you by your health care provider. Make sure you discuss any questions you have with your health care provider. Document Revised: 06/14/2017 Document Reviewed: 09/07/2016 Elsevier Patient Education  2020 [employer].

## 2019-12-11 NOTE — Telephone Encounter (Signed)
Spoke with pt, echo and carotid scheduled.

## 2019-12-11 NOTE — Telephone Encounter (Signed)
Patient is calling to follow up regarding scheduling echocardiogram and carotid. Need orders prior to scheduling. Please assist.

## 2019-12-12 ENCOUNTER — Encounter: Payer: Self-pay | Admitting: Internal Medicine

## 2019-12-12 DIAGNOSIS — M609 Myositis, unspecified: Secondary | ICD-10-CM | POA: Insufficient documentation

## 2019-12-16 ENCOUNTER — Other Ambulatory Visit: Payer: Self-pay

## 2019-12-16 ENCOUNTER — Ambulatory Visit (HOSPITAL_COMMUNITY)
Admission: RE | Admit: 2019-12-16 | Discharge: 2019-12-16 | Disposition: A | Discharge status: Home / Self Care | Payer: PPO | Source: Ambulatory Visit | Attending: Cardiology | Admitting: Cardiology

## 2019-12-16 DIAGNOSIS — H349 Unspecified retinal vascular occlusion: Secondary | ICD-10-CM | POA: Diagnosis not present

## 2020-01-05 ENCOUNTER — Ambulatory Visit: Payer: PPO | Admitting: Pulmonary Disease

## 2020-01-06 ENCOUNTER — Ambulatory Visit: Payer: PPO | Admitting: Emergency Medicine

## 2020-01-06 ENCOUNTER — Encounter: Payer: Self-pay | Admitting: Emergency Medicine

## 2020-01-06 ENCOUNTER — Other Ambulatory Visit: Payer: Self-pay

## 2020-01-06 DIAGNOSIS — J301 Allergic rhinitis due to pollen: Secondary | ICD-10-CM | POA: Diagnosis not present

## 2020-01-06 DIAGNOSIS — R053 Chronic cough: Secondary | ICD-10-CM

## 2020-01-06 DIAGNOSIS — F17200 Nicotine dependence, unspecified, uncomplicated: Secondary | ICD-10-CM

## 2020-01-06 DIAGNOSIS — R05 Cough: Secondary | ICD-10-CM | POA: Diagnosis not present

## 2020-01-06 DIAGNOSIS — J432 Centrilobular emphysema: Secondary | ICD-10-CM

## 2020-01-06 DIAGNOSIS — J309 Allergic rhinitis, unspecified: Secondary | ICD-10-CM | POA: Insufficient documentation

## 2020-01-06 NOTE — Assessment & Plan Note (Signed)
She wants to try to undertake a trial off of Spiriva now that she is quit smoking.  I support this.  She will let us know if she decides to stay on it or stay off of it.  She needs an albuterol inhaler to use as needed.  Okay to try stopping Spiriva for 2 to 3 weeks to see if you miss this.  If your breathing remains the same, you do not have any new symptoms, then you can stay off Spiriva indefinitely.  Please call and leave a message to let us know if you have decided to stay off of it or restart it. We will get you a prescription for albuterol.  Keep this available use 2 puffs if you need it for shortness of breath, chest tightness, wheezing. COVID-19 vaccine is up-to-date Follow with Dr. Lamonte Sakai in 12 months or sooner if you have any problems.

## 2020-01-06 NOTE — Assessment & Plan Note (Signed)
Continue Singulair as you have been taking it. Keep Flonase available to use if you need it for increased congestion, allergy symptoms

## 2020-01-06 NOTE — Assessment & Plan Note (Signed)
She just quit smoking!.  She is using Wellbutrin, weaning down nicotine patches.  I congratulated her and support her in her efforts.

## 2020-01-06 NOTE — Progress Notes (Signed)
Subjective:    Patient ID: [name], female    DOB: May 16, 1951, 69 y.o.   MRN: [id number]  HPI 69 year old smoker (30 pack years) with a history of hypertension, diabetes, hyperlipidemia, allergic rhinitis.  She has a family history of lung cancer.  She is referred today for evaluation of COPD.  She she reports that she has been having more cough over the last few months, clear and easy to move. May be more in the am. She is having some sporadic dyspnea, can happen at rest, can happen with stairs. She hears mucous in her throat, no overt wheeze. She had a CXR 5/8 that showed some emphysematous change, ? L basilar atx change. She is on singulair prn, nasal saline washes, nasal steroids prn. She was just started on Symbicort 2 weeks ago. She has not noticed any change in breathing or mucous.   ROV 12/12/17 --patient follows up today for her dyspnea.  History of tobacco use.  She underwent pulmonary function testing on 12/11/2017.  This shows evidence for mild obstruction without a bronchodilator response, normal lung volumes and a slightly decreased diffusion capacity that does not correct to the normal range when adjusted for alveolar volume. She feel some dyspnea. She is doing a lot of coughing, seems to be impacted by poor GERD control. She is on [place], mucinex, flonase, singulair.   ROV 01/06/20 --follow-up visit for [name], 69 year old smoker whom I last saw 2 years ago.  History of hypertension, diabetes, hyperlipidemia, GERD, allergic rhinitis and COPD, OSA noncompliant with CPAP.  She has seen B Warner Mccreedy here in the interim.  She quit smoking 11/02/19!!  She is on Wellbutrin, plans to continue this and is weaning nicotine patches. Sinuses are better, taste has returned, breathing better at night - less congestion. No cough, no wheeze.   Currently managed on Spiriva Respimat.  She doesn't have an albuterol.  She is on Singulair, Flonase prn, mucinex prn. COVID vaccine up to date.    Pulmonary function test From 12/11/2017 reviewed by me, show mild obstruction with without a bronchodilator response, normal volumes and a slightly decreased diffusion capacity.  MDM: Reviewed pulmonary function testing 12/11/2017 Reviewed lung cancer screening CT 06/02/2019 Reviewed office notes 09/07/2019  Review of Systems  Constitutional: Negative for fever and unexpected weight change.  HENT: Negative for congestion, dental problem, ear pain, nosebleeds, postnasal drip, rhinorrhea, sinus pressure, sneezing, sore throat and trouble swallowing.   Eyes: Negative for redness and itching.  Respiratory: Positive for cough and shortness of breath. Negative for chest tightness and wheezing.   Cardiovascular: Negative for palpitations and leg swelling.  Gastrointestinal: Negative for nausea and vomiting.  Genitourinary: Negative for dysuria.  Musculoskeletal: Negative for joint swelling.  Skin: Negative for rash.  Neurological: Negative for headaches.  Hematological: Does not bruise/bleed easily.  Psychiatric/Behavioral: Negative for dysphoric mood. The patient is not nervous/anxious.        Objective:   Physical Exam Vitals:   01/06/20 1148  BP: 118/74  Pulse: 80  Temp: 98.4 F (36.9 C)  TempSrc: Oral  SpO2: 95%  Weight: 164 lb (74.4 kg)  Height: 5\' 5"  (1.651 m)   Gen: Pleasant, well-nourished, in no distress,  normal affect  ENT: No lesions,  mouth clear,  oropharynx clear, no postnasal drip  Neck: No JVD, no stridor  Lungs: No use of accessory muscles, clear B with a normal breath and also on a forced expiration  Cardiovascular: RRR, heart sounds normal, no murmur or  gallops, no peripheral edema  Musculoskeletal: No deformities, no cyanosis or clubbing  Neuro: alert, non focal  Skin: Warm, no lesions or rashes     Assessment & Plan:  Chronic cough Resolved  Current smoker She just quit smoking!.  She is using Wellbutrin, weaning down nicotine patches.  I  congratulated her and support her in her efforts.  COPD GOLD A-B She wants to try to undertake a trial off of Spiriva now that she is quit smoking.  I support this.  She will let us know if she decides to stay on it or stay off of it.  She needs an albuterol inhaler to use as needed.  Okay to try stopping Spiriva for 2 to 3 weeks to see if you miss this.  If your breathing remains the same, you do not have any new symptoms, then you can stay off Spiriva indefinitely.  Please call and leave a message to let us know if you have decided to stay off of it or restart it. We will get you a prescription for albuterol.  Keep this available use 2 puffs if you need it for shortness of breath, chest tightness, wheezing. COVID-19 vaccine is up-to-date Follow with Dr. Lamonte Sakai in 12 months or sooner if you have any problems.   Allergic rhinitis Continue Singulair as you have been taking it. Keep Flonase available to use if you need it for increased congestion, allergy symptoms  Baltazar Apo, MD, PhD 01/06/2020, 12:07 PM [facility] [phone] or if no answer [phone]

## 2020-01-06 NOTE — Patient Instructions (Addendum)
Okay to try stopping Spiriva for 2 to 3 weeks to see if you miss this.  If your breathing remains the same, you do not have any new symptoms, then you can stay off Spiriva indefinitely.  Please call and leave a message to let us know if you have decided to stay off of it or restart it. We will get you a prescription for albuterol.  Keep this available use 2 puffs if you need it for shortness of breath, chest tightness, wheezing. Continue Singulair as you have been taking it. Keep Flonase available to use if you need it for increased congestion, allergy symptoms Congratulations on stopping smoking! COVID-19 vaccine is up-to-date Follow with Dr. Lamonte Sakai in 12 months or sooner if you have any problems.

## 2020-01-06 NOTE — Assessment & Plan Note (Signed)
Resolved

## 2020-01-25 ENCOUNTER — Other Ambulatory Visit: Payer: Self-pay | Admitting: Cardiology

## 2020-01-25 ENCOUNTER — Other Ambulatory Visit: Payer: Self-pay

## 2020-01-25 MED ORDER — PRALUENT 150 MG/ML ~~LOC~~ SOAJ: 150.0000 mg | SUBCUTANEOUS | 11 refills | Status: DC

## 2020-01-25 NOTE — Progress Notes (Signed)
Assessment and Plan:  Diagnoses and all orders for this visit:  Vaginal itching -     Urinalysis w microscopic + reflex culture -     WET PREP BY MOLECULAR PROBE  Vaginal candidosis Treatment based on physical examination and labs to confirm this. -     fluconazole (DIFLUCAN) 150 MG tablet; Take one tablet daily x2 days and final tablet 01/30/19.     Further disposition pending results of labs. Discussed med's effects and SE's.   Over 30 minutes of face to face interview, exam, counseling, chart review, and critical decision making was performed.   Future Appointments  Date Time Provider [place]  02/17/2020  9:00 AM Unk Pinto, MD [facility] None    ------------------------------------------------------------------------------------------------------------------   HPI 69 y.o.female presents for evaluation of vaginal itching.  She reports that she first noticed these symptoms one week ago.  She started Monastat 7 treatment and reports there was little improvement with this.  She reports that she has also had some vaginal burning as well.  She endorses some rectal puritis.  She denies any pets in the home and also denies any recent trips to the beach.  She is not sexually active.    She denies any dysuria, hematuria, abdominal pain, nausea, vomiting, vaginal discharge, odor or bleeding.  She reports she has not had a vaginal yeast infection in years.   Over one month ago she reports she was treated with Cipro and flagyl for diverticulitis.    Past Medical History:  Diagnosis Date  . Allergy    enviromental  . Anxiety   . Arthritis    osteoarthritis  . COPD (chronic obstructive pulmonary disease) (HCC)    mild  . Diabetes mellitus without complication ([place])    controlled by diet  . Environmental allergies   . GERD (gastroesophageal reflux disease)   . Hepatitis    hepatitis A  . History of colon polyps   . Hyperlipidemia   . Hypertension   . IBS (irritable  bowel syndrome)   . Mixed hyperlipidemia 06/15/2013  . PONV (postoperative nausea and vomiting)    second surgery no problems last surgery  . Solitary pulmonary nodule 06/02/2019  . Urgency of urination    Takes oxybutynin; under control at this time     Allergies  Allergen Reactions  . Enalapril Swelling    Caused lips to swell and bloating.  . Repatha [Evolocumab]     Stated she didn't tolerate it and switched to Praluent    Current Outpatient Medications on File Prior to Visit  Medication Sig  . Alirocumab (PRALUENT) 150 MG/ML SOAJ Inject 150 mg into the skin every 14 (fourteen) days.  . Ascorbic Acid (VITAMIN C) 1000 MG tablet Take 1,000 mg by mouth daily.  Marland Kitchen aspirin 81 MG tablet Take 81 mg by mouth every other day.   . bisoprolol-hydrochlorothiazide (ZIAC) 10-6.25 MG tablet Take 0.5 tablets by mouth daily.  Marland Kitchen buPROPion (WELLBUTRIN XL) 300 MG 24 hr tablet Take 1 tablet (300 mg total) by mouth daily.  . Cholecalciferol (VITAMIN D3) 5000 UNITS TABS Take 5,000 Units by mouth daily.  . clonazePAM (KLONOPIN) 0.5 MG tablet Take 1/2 to 1 tablet as needed  . clopidogrel (PLAVIX) 75 MG tablet Take 1 tablet Daily to Prevent Blood Clots  . Cyanocobalamin (VITAMIN B12 PO) Take 1,000 mcg by mouth daily.  Marland Kitchen dextromethorphan-guaiFENesin (MUCINEX DM) 30-600 MG 12hr tablet Take 1 tablet by mouth as needed.   . fluticasone (FLONASE) 50 MCG/ACT nasal  spray 1 Spray each nostril daily  . losartan (COZAAR) 100 MG tablet Take 1 tablet Daily for BP  . methylcellulose oral powder Take by mouth daily.   . montelukast (SINGULAIR) 10 MG tablet TAKE 1 TABLET(10 MG) BY MOUTH DAILY  . Multiple Vitamins-Minerals (MULTI COMPLETE PO) Take 1 capsule by mouth daily.  Marland Kitchen NIFEdipine (PROCARDIA XL/NIFEDICAL XL) 60 MG 24 hr tablet Take 120 mg by mouth daily.  . Omega-3 Fatty Acids (FISH OIL) 1000 MG CAPS Take 1 capsule (1,000 mg total) by mouth 2 (two) times daily with a meal.  . omeprazole (PRILOSEC) 40 MG capsule  Take 1 capsule daily for Indigestion & Heartburn  . phentermine (ADIPEX-P) 37.5 MG tablet Take 1/2 to 1 tablet every Morning for Dieting & Weight Loss  . Tiotropium Bromide Monohydrate (SPIRIVA RESPIMAT) 1.25 MCG/ACT AERS Inhale 2 puffs into the lungs daily.  Marland Kitchen topiramate (TOPAMAX) 50 MG tablet Take 1/2 to 1 tablet 2 x /day at Suppertime & Bedtime for Dieting & Weight Loss  . Zinc 50 MG TABS Take by mouth daily.   No current facility-administered medications on file prior to visit.    ROS: all negative except above.   Physical Exam:  BP 128/68   Pulse 66   Temp (!) 97.1 F (36.2 C)   SpO2 98%   General Appearance: Well nourished, in no apparent distress. Eyes: PERRLA, EOMs, conjunctiva no swelling or erythema Sinuses: No Frontal/maxillary tenderness ENT/Mouth: Ext aud canals clear, TMs without erythema, bulging. No erythema, swelling, or exudate on post pharynx.  Tonsils not swollen or erythematous. Hearing normal.  Neck: Supple, thyroid normal.  Respiratory: Respiratory effort normal, BS equal bilaterally without rales, rhonchi, wheezing or stridor.  Cardio: RRR with no MRGs. Brisk peripheral pulses without edema.  Abdomen: Soft, + BS.  Non tender, no guarding, rebound, hernias, masses. Lymphatics: Non tender without lymphadenopathy.  Musculoskeletal: Full ROM, 5/5 strength, normal gait.  Skin: Warm, dry without rashes, lesions, ecchymosis.  Psych: Awake and oriented X 3, normal affect, Insight and Judgment appropriate.    Pelvic: External genitalia:  no lesions, erythema noted.              Urethra:  normal appearing urethra, erythematous with no masses, tenderness or lesions              Bartholin's and Skene's: normal                 Vagina: erythema noted with white exudate noted, no lesions.                Garnet Sierras, Laqueta Jean, DNP [facility] 01/26/2020  10:10 AM

## 2020-01-26 ENCOUNTER — Ambulatory Visit (INDEPENDENT_AMBULATORY_CARE_PROVIDER_SITE_OTHER): Payer: Medicare Other | Admitting: Adult Health Nurse Practitioner

## 2020-01-26 ENCOUNTER — Encounter: Payer: Self-pay | Admitting: Adult Health Nurse Practitioner

## 2020-01-26 ENCOUNTER — Other Ambulatory Visit: Payer: Self-pay

## 2020-01-26 VITALS — BP 128/68 | HR 66 | Temp 97.1°F

## 2020-01-26 DIAGNOSIS — B3731 Acute candidiasis of vulva and vagina: Secondary | ICD-10-CM

## 2020-01-26 DIAGNOSIS — N898 Other specified noninflammatory disorders of vagina: Secondary | ICD-10-CM | POA: Diagnosis not present

## 2020-01-26 DIAGNOSIS — B373 Candidiasis of vulva and vagina: Secondary | ICD-10-CM | POA: Diagnosis not present

## 2020-01-26 MED ORDER — FLUCONAZOLE 150 MG PO TABS: ORAL_TABLET | ORAL | 3 refills | Status: DC

## 2020-01-27 LAB — URINALYSIS W MICROSCOPIC + REFLEX CULTURE
Bacteria, UA: NONE SEEN /HPF
Bilirubin Urine: NEGATIVE
Glucose, UA: NEGATIVE
Hgb urine dipstick: NEGATIVE
Hyaline Cast: NONE SEEN /LPF
Ketones, ur: NEGATIVE
Leukocyte Esterase: NEGATIVE
Nitrites, Initial: NEGATIVE
Protein, ur: NEGATIVE
Specific Gravity, Urine: 1.013 (ref 1.001–1.03)
WBC, UA: NONE SEEN /HPF (ref 0–5)
pH: 5.5 (ref 5.0–8.0)

## 2020-01-27 LAB — WET PREP BY MOLECULAR PROBE
Candida species: NOT DETECTED
Gardnerella vaginalis: NOT DETECTED
MICRO NUMBER:: 10700009
SPECIMEN QUALITY:: ADEQUATE
Trichomonas vaginosis: NOT DETECTED

## 2020-01-27 LAB — NO CULTURE INDICATED

## 2020-02-01 ENCOUNTER — Other Ambulatory Visit: Payer: Self-pay | Admitting: Adult Health Nurse Practitioner

## 2020-02-01 DIAGNOSIS — B3731 Acute candidiasis of vulva and vagina: Secondary | ICD-10-CM

## 2020-02-02 DIAGNOSIS — H35033 Hypertensive retinopathy, bilateral: Secondary | ICD-10-CM | POA: Diagnosis not present

## 2020-02-02 DIAGNOSIS — H353112 Nonexudative age-related macular degeneration, right eye, intermediate dry stage: Secondary | ICD-10-CM | POA: Diagnosis not present

## 2020-02-02 DIAGNOSIS — H43812 Vitreous degeneration, left eye: Secondary | ICD-10-CM | POA: Diagnosis not present

## 2020-02-02 DIAGNOSIS — H353121 Nonexudative age-related macular degeneration, left eye, early dry stage: Secondary | ICD-10-CM | POA: Diagnosis not present

## 2020-02-09 ENCOUNTER — Other Ambulatory Visit: Payer: Self-pay | Admitting: Adult Health

## 2020-02-09 ENCOUNTER — Other Ambulatory Visit: Payer: Self-pay | Admitting: Internal Medicine

## 2020-02-09 DIAGNOSIS — B3731 Acute candidiasis of vulva and vagina: Secondary | ICD-10-CM

## 2020-02-10 ENCOUNTER — Other Ambulatory Visit: Payer: Self-pay | Admitting: Internal Medicine

## 2020-02-10 ENCOUNTER — Telehealth: Payer: Self-pay | Admitting: Internal Medicine

## 2020-02-10 DIAGNOSIS — B3731 Acute candidiasis of vulva and vagina: Secondary | ICD-10-CM

## 2020-02-10 MED ORDER — FLUCONAZOLE 150 MG PO TABS: ORAL_TABLET | ORAL | 2 refills | Status: DC

## 2020-02-10 NOTE — Telephone Encounter (Signed)
Dr Melford Aase is aware and will resend the RX.

## 2020-02-10 NOTE — Telephone Encounter (Signed)
patient states pharmacy never recieved the yeast infection rx. Pharm waiting on change of rx per provider

## 2020-02-16 ENCOUNTER — Encounter: Payer: Self-pay | Admitting: Internal Medicine

## 2020-02-16 NOTE — Patient Instructions (Signed)

## 2020-02-16 NOTE — Progress Notes (Signed)
Annual Screening/Preventative Visit & Comprehensive Evaluation &  Examination     This very nice 70 y.o. Married [place] presents for a Screening /Preventative Visit & comprehensive evaluation and management of multiple medical co-morbidities.  Patient has been followed for HTN, HLD, Prediabetes  and Vitamin D Deficiency.  Patient has  COPD, OSA/CPAP and is followed by Dr Lamonte Sakai & Wyn Quaker, NP.  Patient's GERD is controlled on her meds.       HTN predates circa 1988.  Patient has been evaluated by Dr Stanford Breed in the past & had Cor Aa calcification, Aortic Atherosclerosis, Negative Stress Cardiac Echo.   Patient's BP has been controlled at home and patient denies any cardiac symptoms as chest pain, palpitations, shortness of breath, dizziness or ankle swelling. Today's BP is borderline elevated at 146/72.      Patient is intolerant of Statins & Repatha and is on Praulent with good response. Patient denies myalgias or other medication SE's. Last lipids were at goal:  Lab Results  Component Value Date   CHOL 194 11/09/2019   HDL 85 11/09/2019   LDLCALC 89 11/09/2019   TRIG 107 11/09/2019   CHOLHDL 2.3 11/09/2019       Patient has hx/o prediabetes (A1c 5.7% / 2017)  and patient denies reactive hypoglycemic symptoms, visual blurring, diabetic polys or paresthesias. Last A1c was Normal & at goal:  Lab Results  Component Value Date   HGBA1C 5.4 11/09/2019       Finally, patient has history of Vitamin D Deficiency and last Vitamin D was at goal:  Lab Results  Component Value Date   VD25OH 59 11/09/2019    Current Outpatient Medications on File Prior to Visit  Medication Sig  . Alirocumab (PRALUENT) 150 MG/ML SOAJ Inject 150 mg into the skin every 14 (fourteen) days.  . Ascorbic Acid (VITAMIN C) 1000 MG tablet Take 1,000 mg by mouth daily.  Marland Kitchen aspirin 81 MG tablet Take 81 mg by mouth every other day.   . bisoprolol-hydrochlorothiazide (ZIAC) 10-6.25 MG tablet  Take 0.5 tablets by mouth daily.  Marland Kitchen buPROPion (WELLBUTRIN XL) 300 MG 24 hr tablet Take 1 tablet (300 mg total) by mouth daily.  . Cholecalciferol (VITAMIN D3) 5000 UNITS TABS Take 5,000 Units by mouth daily.  . clonazePAM (KLONOPIN) 0.5 MG tablet Take 1/2 to 1 tablet as needed  . clopidogrel (PLAVIX) 75 MG tablet Take 1 tablet Daily to Prevent Blood Clots  . Cyanocobalamin (VITAMIN B12 PO) Take 1,000 mcg by mouth daily.  Marland Kitchen dextromethorphan-guaiFENesin (MUCINEX DM) 30-600 MG 12hr tablet Take 1 tablet by mouth as needed.   . fluconazole (DIFLUCAN) 150 MG tablet Take 1 tablet 2 x /week as needed for Yeast Infection  . fluticasone (FLONASE) 50 MCG/ACT nasal spray 1 Spray each nostril daily  . losartan (COZAAR) 100 MG tablet Take 1 tablet Daily for BP  . methylcellulose oral powder Take by mouth daily.   . montelukast (SINGULAIR) 10 MG tablet TAKE 1 TABLET(10 MG) BY MOUTH DAILY  . Multiple Vitamins-Minerals (MULTI COMPLETE PO) Take 1 capsule by mouth daily.  Marland Kitchen NIFEdipine (PROCARDIA XL/NIFEDICAL XL) 60 MG 24 hr tablet Take 120 mg by mouth daily.  . Omega-3 Fatty Acids (FISH OIL) 1000 MG CAPS Take 1 capsule (1,000 mg total) by mouth 2 (two) times daily with a meal.  . omeprazole (PRILOSEC) 40 MG capsule Take 1 capsule daily for Indigestion & Heartburn  . Zinc 50 MG TABS Take by mouth daily.  Marland Kitchen  phentermine (ADIPEX-P) 37.5 MG tablet Take 1/2 to 1 tablet every Morning for Dieting & Weight Loss (Patient not taking: Reported on 02/17/2020)  . topiramate (TOPAMAX) 50 MG tablet Take 1/2 to 1 tablet 2 x /day at Suppertime & Bedtime for Dieting & Weight Loss (Patient not taking: Reported on 02/17/2020)   No current facility-administered medications on file prior to visit.   Allergies  Allergen Reactions  . Enalapril Swelling    Caused lips to swell and bloating.  . Repatha [Evolocumab]     Stated she didn't tolerate it and switched to Praluent   Past Medical History:  Diagnosis Date  . Allergy     enviromental  . Anxiety   . Arthritis    osteoarthritis  . COPD (chronic obstructive pulmonary disease) (HCC)    mild  . Diabetes mellitus without complication ([place])    controlled by diet  . Environmental allergies   . GERD (gastroesophageal reflux disease)   . Hepatitis    hepatitis A  . History of colon polyps   . Hyperlipidemia   . Hypertension   . IBS (irritable bowel syndrome)   . Mixed hyperlipidemia 06/15/2013  . PONV (postoperative nausea and vomiting)    second surgery no problems last surgery  . Solitary pulmonary nodule 06/02/2019  . Urgency of urination    Takes oxybutynin; under control at this time   Health Maintenance  Topic Date Due  . MAMMOGRAM  11/13/2019  . INFLUENZA VACCINE  02/14/2020  . TETANUS/TDAP  02/06/2021  . COLONOSCOPY  02/09/2024  . DEXA SCAN  Completed  . COVID-19 Vaccine  Completed  . Hepatitis C Screening  Completed  . PNA vac Low Risk Adult  Completed   Immunization History  Administered Date(s) Administered  . Influenza, High Dose Seasonal PF 04/10/2017, 05/08/2019  . Influenza-Unspecified 03/05/2018  . PFIZER SARS-COV-2 Vaccination 08/13/2019, 09/09/2019  . PPD Test 09/21/2014  . Pneumococcal Conjugate-13 10/22/2017  . Pneumococcal Polysaccharide-23 02/07/2011, 01/26/2016  . Tdap 02/07/2011    Last Colon -  02/08/2014 - Dr Deatra Ina - recc 10 yr f/u due July 2025  Last MGM - 11/13/2018  Past Surgical History:  Procedure Laterality Date  . APPENDECTOMY     removed as child  . BACK SURGERY     lumbar fusion  . CATARACT EXTRACTION Left 2019  . COLONOSCOPY    . DILATION AND CURETTAGE OF UTERUS    . LUMBAR FUSION  02/06/2012   L 4  L5  . PARTIAL HYSTERECTOMY    . POLYPECTOMY    . TOTAL HIP ARTHROPLASTY  06/02/2012   Procedure: TOTAL HIP ARTHROPLASTY;  Surgeon: Kerin Salen, MD;  Location: [facility];  Service: Orthopedics;  Laterality: Right;  . TOTAL HIP ARTHROPLASTY Left 01/25/2016  . TOTAL HIP ARTHROPLASTY Left 01/25/2016    Procedure: LEFT TOTAL HIP ARTHROPLASTY;  Surgeon: Frederik Pear, MD;  Location: [place];  Service: Orthopedics;  Laterality: Left;  . TOTAL KNEE ARTHROPLASTY Right 12/15/2012   Procedure: TOTAL KNEE ARTHROPLASTY;  Surgeon: Kerin Salen, MD;  Location: [place];  Service: Orthopedics;  Laterality: Right;  . TOTAL KNEE ARTHROPLASTY Left 06/18/2016   Procedure: TOTAL KNEE ARTHROPLASTY;  Surgeon: Frederik Pear, MD;  Location: [place];  Service: Orthopedics;  Laterality: Left;  . UPPER GASTROINTESTINAL ENDOSCOPY  05/06/2019   Family History  Problem Relation Age of Onset  . Diabetes Mother   . Colon cancer Mother 64  . Hypertension Father   . CAD Father   . Colon  cancer Sister 49  . Lung cancer Brother 6  . Hypertension Brother   . Hypertension Brother   . Cancer Maternal Grandmother        Unsure what kind  . Peripheral Artery Disease Paternal Grandfather   . Esophageal cancer Neg Hx   . Rectal cancer Neg Hx   . Stomach cancer Neg Hx    Social History   Tobacco Use  . Smoking status: Former Smoker    Packs/day: 1.00    Years: 35.00    Pack years: 35.00    Types: Cigarettes    Start date: 36    Quit date: 11/02/2019    Years since quitting: 0.2  . Smokeless tobacco: Never Used  . Tobacco comment: Quit 10/05/2019   Vaping Use  . Vaping Use: Never used  Substance Use Topics  . Alcohol use: Yes    Comment: 2-4 beers per night, advised to cut back   . Drug use: Yes    Types: Marijuana    Comment: current, 5 joints/month     ROS Constitutional: Denies fever, chills, weight loss/gain, headaches, insomnia,  night sweats, and change in appetite. Does c/o fatigue. Eyes: Denies redness, blurred vision, diplopia, discharge, itchy, watery eyes.  ENT: Denies discharge, congestion, post nasal drip, epistaxis, sore throat, earache, hearing loss, dental pain, Tinnitus, Vertigo, Sinus pain, snoring.  Cardio: Denies chest pain, palpitations, irregular heartbeat, syncope, dyspnea, diaphoresis,  orthopnea, PND, claudication, edema Respiratory: denies cough, dyspnea, DOE, pleurisy, hoarseness, laryngitis, wheezing.  Gastrointestinal: Denies dysphagia, heartburn, reflux, water brash, pain, cramps, nausea, vomiting, bloating, diarrhea, constipation, hematemesis, melena, hematochezia, jaundice, hemorrhoids Genitourinary: Denies dysuria, frequency, urgency, nocturia, hesitancy, discharge, hematuria, flank pain Breast: Breast lumps, nipple discharge, bleeding.  Musculoskeletal: Denies arthralgia, myalgia, stiffness, Jt. Swelling, pain, limp, and strain/sprain. Denies falls. Skin: Denies puritis, rash, hives, warts, acne, eczema, changing in skin lesion Neuro: No weakness, tremor, incoordination, spasms, paresthesia, pain Psychiatric: Denies confusion, memory loss, sensory loss. Denies Depression. Endocrine: Denies change in weight, skin, hair change, nocturia, and paresthesia, diabetic polys, visual blurring, hyper / hypo glycemic episodes.  Heme/Lymph: No excessive bleeding, bruising, enlarged lymph nodes.  Physical Exam  BP (!) 146/72   Pulse 64   Temp (!) 97.5 F (36.4 C)   Resp 16   Ht 5\' 5"  (1.651 m)   Wt 161 lb 3.2 oz (73.1 kg)   BMI 26.83 kg/m   General Appearance: Well nourished, well groomed and in no apparent distress.  Eyes: PERRLA, EOMs, conjunctiva no swelling or erythema, normal fundi and vessels. Sinuses: No frontal/maxillary tenderness ENT/Mouth: EACs patent / TMs  nl. Nares clear without erythema, swelling, mucoid exudates. Oral hygiene is good. No erythema, swelling, or exudate. Tongue normal, non-obstructing. Tonsils not swollen or erythematous. Hearing normal.  Neck: Supple, thyroid not palpable. No bruits, nodes or JVD. Respiratory: Respiratory effort normal.  BS equal and clear bilateral without rales, rhonci, wheezing or stridor. Cardio: Heart sounds are normal with regular rate and rhythm and no murmurs, rubs or gallops. Peripheral pulses are normal and equal  bilaterally without edema. No aortic or femoral bruits. Chest: symmetric with normal excursions and percussion. Breasts: Symmetric, without lumps, nipple discharge, retractions, or fibrocystic changes.  Abdomen: Flat, soft with bowel sounds active. Nontender, no guarding, rebound, hernias, masses, or organomegaly.  Lymphatics: Non tender without lymphadenopathy.  Genitourinary:  Musculoskeletal: Full ROM all peripheral extremities, joint stability, 5/5 strength, and normal gait. Skin: Warm and dry without rashes, lesions, cyanosis, clubbing or  ecchymosis.  Neuro:  Cranial nerves intact, reflexes equal bilaterally. Normal muscle tone, no cerebellar symptoms. Sensation intact.  Pysch: Alert and oriented X 3, normal affect, Insight and Judgment appropriate.   Assessment and Plan  1. Annual Preventative Screening Examination   2. Essential hypertension  - EKG 12-Lead - Urinalysis, Routine w reflex microscopic - Microalbumin / creatinine urine ratio - CBC with Differential/Platelet - COMPLETE METABOLIC PANEL WITH GFR - Magnesium - TSH  3. Hyperlipidemia, mixed  - EKG 12-Lead - Lipid panel - TSH  4. Abnormal glucose  - EKG 12-Lead - Hemoglobin A1c - Insulin, random  5. Vitamin D deficiency  - VITAMIN D 25 Hydroxy   6. Gastroesophageal reflux disease  - CBC with Differential/Platelet  7. Chronic obstructive pulmonary disease ([place])  8. OSA on CPAP  9. Screening for colorectal cancer  - POC Hemoccult Bld/Stl   10. Screening for ischemic heart disease  - EKG 12-Lead  11. FH: hypertension  - EKG 12-Lead  12. Former smoker  31. Aortic atherosclerosis by Abd CT scan (HCC)  - EKG 12-Lead  14. Medication management  - Urinalysis, Routine w reflex microscopic - Microalbumin / creatinine urine ratio - CBC with Differential/Platelet - COMPLETE METABOLIC PANEL WITH GFR - Magnesium - Lipid panel - TSH - Hemoglobin A1c - Insulin, random - VITAMIN D 25  Hydroxy        Patient was counseled in prudent diet to achieve/maintain BMI less than 25 for weight control, BP monitoring, regular exercise and medications. Discussed med's effects and SE's. Screening labs and tests as requested with regular follow-up as recommended. Over 40 minutes of exam, counseling, chart review and high complex critical decision making was performed.   Kirtland Bouchard, MD

## 2020-02-17 ENCOUNTER — Ambulatory Visit (INDEPENDENT_AMBULATORY_CARE_PROVIDER_SITE_OTHER): Payer: Medicare Other | Admitting: Internal Medicine

## 2020-02-17 ENCOUNTER — Other Ambulatory Visit: Payer: Self-pay

## 2020-02-17 VITALS — BP 146/72 | HR 64 | Temp 97.5°F | Resp 16 | Ht 65.0 in | Wt 161.2 lb

## 2020-02-17 DIAGNOSIS — E782 Mixed hyperlipidemia: Secondary | ICD-10-CM

## 2020-02-17 DIAGNOSIS — R7309 Other abnormal glucose: Secondary | ICD-10-CM | POA: Diagnosis not present

## 2020-02-17 DIAGNOSIS — F17200 Nicotine dependence, unspecified, uncomplicated: Secondary | ICD-10-CM | POA: Diagnosis not present

## 2020-02-17 DIAGNOSIS — N760 Acute vaginitis: Secondary | ICD-10-CM | POA: Diagnosis not present

## 2020-02-17 DIAGNOSIS — K219 Gastro-esophageal reflux disease without esophagitis: Secondary | ICD-10-CM | POA: Diagnosis not present

## 2020-02-17 DIAGNOSIS — E559 Vitamin D deficiency, unspecified: Secondary | ICD-10-CM

## 2020-02-17 DIAGNOSIS — Z1212 Encounter for screening for malignant neoplasm of rectum: Secondary | ICD-10-CM

## 2020-02-17 DIAGNOSIS — Z0000 Encounter for general adult medical examination without abnormal findings: Secondary | ICD-10-CM

## 2020-02-17 DIAGNOSIS — Z0001 Encounter for general adult medical examination with abnormal findings: Secondary | ICD-10-CM

## 2020-02-17 DIAGNOSIS — G4733 Obstructive sleep apnea (adult) (pediatric): Secondary | ICD-10-CM

## 2020-02-17 DIAGNOSIS — Z1211 Encounter for screening for malignant neoplasm of colon: Secondary | ICD-10-CM

## 2020-02-17 DIAGNOSIS — Z8249 Family history of ischemic heart disease and other diseases of the circulatory system: Secondary | ICD-10-CM

## 2020-02-17 DIAGNOSIS — Z136 Encounter for screening for cardiovascular disorders: Secondary | ICD-10-CM

## 2020-02-17 DIAGNOSIS — I10 Essential (primary) hypertension: Secondary | ICD-10-CM | POA: Diagnosis not present

## 2020-02-17 DIAGNOSIS — Z79899 Other long term (current) drug therapy: Secondary | ICD-10-CM

## 2020-02-17 DIAGNOSIS — Z87891 Personal history of nicotine dependence: Secondary | ICD-10-CM | POA: Diagnosis not present

## 2020-02-17 DIAGNOSIS — I700 Atherosclerosis of aorta: Secondary | ICD-10-CM

## 2020-02-17 DIAGNOSIS — J449 Chronic obstructive pulmonary disease, unspecified: Secondary | ICD-10-CM

## 2020-02-18 LAB — URINALYSIS, ROUTINE W REFLEX MICROSCOPIC
Bacteria, UA: NONE SEEN /HPF
Bilirubin Urine: NEGATIVE
Glucose, UA: NEGATIVE
Hyaline Cast: NONE SEEN /LPF
Ketones, ur: NEGATIVE
Nitrite: NEGATIVE
Protein, ur: NEGATIVE
Specific Gravity, Urine: 1.005 (ref 1.001–1.03)
WBC, UA: NONE SEEN /HPF (ref 0–5)
pH: 7.5 (ref 5.0–8.0)

## 2020-02-18 LAB — LIPID PANEL
Cholesterol: 208 mg/dL — ABNORMAL HIGH (ref <200)
HDL: 87 mg/dL (ref >=50)
LDL Cholesterol (Calc): 100 mg/dL (calc) — ABNORMAL HIGH
Non-HDL Cholesterol (Calc): 121 mg/dL (calc) (ref <130)
Total CHOL/HDL Ratio: 2.4 (calc) (ref <5.0)
Triglycerides: 113 mg/dL (ref <150)

## 2020-02-18 LAB — CBC WITH DIFFERENTIAL/PLATELET
Absolute Monocytes: 510 cells/uL (ref 200–950)
Basophils Absolute: 63 cells/uL (ref 0–200)
Basophils Relative: 1 %
Eosinophils Absolute: 183 cells/uL (ref 15–500)
Eosinophils Relative: 2.9 %
HCT: 41.1 % (ref 35.0–45.0)
Hemoglobin: 13.4 g/dL (ref 11.7–15.5)
Lymphs Abs: 2092 cells/uL (ref 850–3900)
MCH: 27 pg (ref 27.0–33.0)
MCHC: 32.6 g/dL (ref 32.0–36.0)
MCV: 82.9 fL (ref 80.0–100.0)
MPV: 10.5 fL (ref 7.5–12.5)
Monocytes Relative: 8.1 %
Neutro Abs: 3452 cells/uL (ref 1500–7800)
Neutrophils Relative %: 54.8 %
Platelets: 302 10*3/uL (ref 140–400)
RBC: 4.96 10*6/uL (ref 3.80–5.10)
RDW: 14 % (ref 11.0–15.0)
Total Lymphocyte: 33.2 %
WBC: 6.3 10*3/uL (ref 3.8–10.8)

## 2020-02-18 LAB — TSH: TSH: 1.27 mIU/L (ref 0.40–4.50)

## 2020-02-18 LAB — COMPLETE METABOLIC PANEL WITH GFR
AG Ratio: 1.7 (calc) (ref 1.0–2.5)
ALT: 12 U/L (ref 6–29)
AST: 21 U/L (ref 10–35)
Albumin: 4.5 g/dL (ref 3.6–5.1)
Alkaline phosphatase (APISO): 80 U/L (ref 37–153)
BUN: 13 mg/dL (ref 7–25)
CO2: 29 mmol/L (ref 20–32)
Calcium: 10.1 mg/dL (ref 8.6–10.4)
Chloride: 103 mmol/L (ref 98–110)
Creat: 0.78 mg/dL (ref 0.50–0.99)
GFR, Est African American: 90 mL/min/{1.73_m2} (ref >=60)
GFR, Est Non African American: 78 mL/min/{1.73_m2} (ref >=60)
Globulin: 2.7 g/dL (calc) (ref 1.9–3.7)
Glucose, Bld: 104 mg/dL — ABNORMAL HIGH (ref 65–99)
Potassium: 4.5 mmol/L (ref 3.5–5.3)
Sodium: 140 mmol/L (ref 135–146)
Total Bilirubin: 0.5 mg/dL (ref 0.2–1.2)
Total Protein: 7.2 g/dL (ref 6.1–8.1)

## 2020-02-18 LAB — VITAMIN D 25 HYDROXY (VIT D DEFICIENCY, FRACTURES): Vit D, 25-Hydroxy: 67 ng/mL (ref 30–100)

## 2020-02-18 LAB — MAGNESIUM: Magnesium: 1.9 mg/dL (ref 1.5–2.5)

## 2020-02-18 LAB — MICROALBUMIN / CREATININE URINE RATIO
Creatinine, Urine: 17 mg/dL — ABNORMAL LOW (ref 20–275)
Microalb Creat Ratio: 47 mcg/mg creat — ABNORMAL HIGH (ref <30)
Microalb, Ur: 0.8 mg/dL

## 2020-02-18 LAB — HEMOGLOBIN A1C
Hgb A1c MFr Bld: 5.6 % of total Hgb (ref <5.7)
Mean Plasma Glucose: 114 (calc)
eAG (mmol/L): 6.3 (calc)

## 2020-02-18 LAB — INSULIN, RANDOM: Insulin: 5 u[IU]/mL

## 2020-02-18 NOTE — Progress Notes (Signed)
============================================================  -    Total Chol = 208 - elevated  (Ideal or Goal is less than 180 )   - and   - Bad/ Dangerous LDL Chol = 100 - Also too high  (Ideal or goal is less than 70 ! )  - Recommend a stricter low cholesterol diet   - Cholesterol only comes from animal sources  - ie. meat, dairy, egg yolks  - Eat all the vegetables you want.  - Avoid meat, especially red meat - Beef AND Pork .  - Avoid cheese & dairy - milk & ice cream.     - Cheese is the most concentrated form of trans-fats which  is the worst thing to clog up our arteries.   - Veggie cheese is OK which can be found in the fresh  produce section at [employer] or [employer] or [employer] ============================================================  -  A1c - Normal - Great - No Diabetes ============================================================  -  Vitamin D = 67 - Excellent  ============================================================  -  All Else - CBC - Kidneys - U/A - Electrolytes - Liver - Magnesium & Thyroid    - all  Normal / OK ============================================================

## 2020-03-02 DIAGNOSIS — H34232 Retinal artery branch occlusion, left eye: Secondary | ICD-10-CM | POA: Diagnosis not present

## 2020-03-29 ENCOUNTER — Other Ambulatory Visit: Payer: Self-pay | Admitting: Internal Medicine

## 2020-05-13 ENCOUNTER — Other Ambulatory Visit: Payer: Self-pay | Admitting: Cardiology

## 2020-05-23 ENCOUNTER — Encounter: Payer: Self-pay | Admitting: Adult Health

## 2020-05-23 NOTE — Progress Notes (Signed)
3 MONTH FOLLOW UP  Assessment:     COPD 0/Centrilobular emphysema Well controlled, followed by pulm  Essential hypertension Continue medications Monitor blood pressure at home; call if consistently over 130/80 Continue DASH diet.   Reminder to go to the ER if any CP, SOB, nausea, dizziness, severe HA, changes vision/speech, left arm numbness and tingling and jaw pain.  Aortic atherosclerosis by Abd CT scan [facility]) Per CT 05/2019 Control blood pressure, cholesterol, glucose, increase exercise.   Gastroesophageal reflux disease, esophagitis presence not specified Well controlled with Omeprazole and PRN carafate; advised STOP NSAID Discussed diet, avoiding triggers and other lifestyle changes  Vitamin D deficiency Continue supplementation for goal 70-100 Check vitamin D level  Other abnormal glucose Recent A1Cs at goal Discussed diet/exercise, weight management  Defer A1C; check CMP  Medication management CBC, CMP/GFR, magnesium   Hyperlipidemia, mixed Continue medications: on praluent via lipid clinic Continue low cholesterol diet and exercise.  Check lipid panel, TSH   Former smoker Commended cessation and maintenance; continue to monitor  Doing annual low dose CT; negative 05/2019, due, follow up ordered  Anxiety/insomnia Recently improved, rare use of benzo  good sleep hygiene discussed, increase day time activity  OSA on CPAP She reports hasn't been on CPAP, wasn't able to tolerate due to restless sleep Discussed with [facility] who prescribed but reports wasn't given any recommendations Questions accuracy of original test, states had to remove for significant portion of study She would like second opinion and possible repeat study; will refer to Dr. Brett Fairy  Need for influenza vaccine High dose quadrivalent administered without complication today    Over 30 minutes of exam, counseling, chart review and critical decision making was performed Future  Appointments  Date Time Provider [place]  09/02/2020 10:30 AM Unk Pinto, MD [facility] None  02/23/2021 10:00 AM Unk Pinto, MD [facility] None     Subjective:  [name] is a 69 y.o. female who presents for 3 month follow up.   She was diagnosed with mild OSA per home sleep study in 2019, was recommended CPAP, she was struggling due to restless sleep, mask keeps ripping off, [place] pulm started and she told them was unable to tolerate but reports wasn't given any recommendations other than 1 year follow up, she questions accuracy of test and interested in second opinion/possible repeat study.  she has a diagnosis of anxiety and is currently on clonazepam 0.25 - 0.5 mg, reports symptoms are fairly controlled on current regimen. she reports use is highly variable, recently hasn't used in weeks. Last filled 30 tabs on 09/2019.   she has a diagnosis of GERD which is currently managed by omeprazole 40 mg daily, had recent EGD in 04/2019 which was unremarkable excepting inflammation.   She is a ormer smoker, 35 pack year history, quit 11/02/2019.  Since quitting, former persistent cough and hoarseness is improved  She had CT lung cancer screening 05/2019 which was negative.   Has COPD on spiriva, follows with Dr. Lamonte Sakai.  BMI is Body mass index is 26.79 kg/m., she has been working on diet and exercise, exercise limited by pandemic, but trying to be more active  Wt Readings from Last 3 Encounters:  05/24/20 161 lb (73 kg)  02/17/20 161 lb 3.2 oz (73.1 kg)  01/06/20 164 lb (74.4 kg)   She follows with Dr. Stanford Breed; Stress echocardiogram October 2018 showed no stress-induced wall motion abnormalities. ECHO 11/2019 showed normal LV function, EF 60-65%, mild AI. Carotid Dopplers 12/2019 showed  less than 39% bilateral stenosis. She has CAD and aortic atherosclerosis by CT 05/2019.    Her blood pressure has been controlled at home, today their BP is BP: 120/66 She does not  workout. She denies chest pain, shortness of breath, dizziness.   She is on cholesterol medication (she is on praluent injectible via cardiology, had allergic reaction repatha, had tingling with numerous statins) and denies myalgias. Her cholesterol is at goal. The cholesterol last visit was:   Lab Results  Component Value Date   CHOL 208 (H) 02/17/2020   HDL 87 02/17/2020   LDLCALC 100 (H) 02/17/2020   TRIG 113 02/17/2020   CHOLHDL 2.4 02/17/2020    She has been working on diet and exercise for glucose management, and denies foot ulcerations, increased appetite, nausea, paresthesia of the feet, polydipsia, polyuria, visual disturbances, vomiting and weight loss. Last A1C in the office was:  Lab Results  Component Value Date   HGBA1C 5.6 02/17/2020   Last GFR: Lab Results  Component Value Date   GFRNONAA 78 02/17/2020   Patient is on Vitamin D supplement and at recent check:   Lab Results  Component Value Date   VD25OH 67 02/17/2020      Medication Review: Current Outpatient Medications on File Prior to Visit  Medication Sig Dispense Refill  . Alirocumab (PRALUENT) 150 MG/ML SOAJ Inject 150 mg into the skin every 14 (fourteen) days. 2 pen 11  . Ascorbic Acid (VITAMIN C) 1000 MG tablet Take 1,000 mg by mouth daily.    Marland Kitchen aspirin 81 MG tablet Take 81 mg by mouth every other day.     . bisoprolol-hydrochlorothiazide (ZIAC) 10-6.25 MG tablet TAKE 1/2 TABLET BY MOUTH DAILY 45 tablet 3  . buPROPion (WELLBUTRIN XL) 300 MG 24 hr tablet Take 1 tablet (300 mg total) by mouth daily. 30 tablet 11  . Cholecalciferol (VITAMIN D3) 5000 UNITS TABS Take 5,000 Units by mouth daily.    . clonazePAM (KLONOPIN) 0.5 MG tablet Take 1/2 to 1 tablet as needed 30 tablet 0  . clopidogrel (PLAVIX) 75 MG tablet Take 1 tablet Daily to Prevent Blood Clots 90 tablet 1  . Cyanocobalamin (VITAMIN B12 PO) Take 1,000 mcg by mouth daily.    Marland Kitchen dextromethorphan-guaiFENesin (MUCINEX DM) 30-600 MG 12hr tablet Take 1  tablet by mouth as needed.     . fluconazole (DIFLUCAN) 150 MG tablet Take 1 tablet 2 x /week as needed for Yeast Infection 8 tablet 2  . fluticasone (FLONASE) 50 MCG/ACT nasal spray 1 Spray each nostril daily 48 g 1  . losartan (COZAAR) 100 MG tablet TAKE 1 TABLET BY MOUTH DAILY FOR BLOOD PRESSURE 90 tablet 3  . methylcellulose oral powder Take by mouth daily.     . montelukast (SINGULAIR) 10 MG tablet TAKE 1 TABLET(10 MG) BY MOUTH DAILY 90 tablet 3  . Multiple Vitamins-Minerals (MULTI COMPLETE PO) Take 1 capsule by mouth daily.    Marland Kitchen NIFEdipine (PROCARDIA XL/NIFEDICAL XL) 60 MG 24 hr tablet Take 120 mg by mouth daily.    . Omega-3 Fatty Acids (FISH OIL) 1000 MG CAPS Take 1 capsule (1,000 mg total) by mouth 2 (two) times daily with a meal. 60 capsule 11  . omeprazole (PRILOSEC) 40 MG capsule Take 1 capsule daily for Indigestion & Heartburn 90 capsule 3  . phentermine (ADIPEX-P) 37.5 MG tablet Take 1/2 to 1 tablet every Morning for Dieting & Weight Loss 90 tablet 1  . Tiotropium Bromide Monohydrate (SPIRIVA RESPIMAT IN) Inhale 1  puff into the lungs 2 (two) times daily.    Marland Kitchen topiramate (TOPAMAX) 50 MG tablet Take 1/2 to 1 tablet 2 x /day at Suppertime & Bedtime for Dieting & Weight Loss 180 tablet 1  . Zinc 50 MG TABS Take by mouth daily.     No current facility-administered medications on file prior to visit.    Allergies  Allergen Reactions  . Enalapril Swelling    Caused lips to swell and bloating.  . Repatha [Evolocumab]     Stated she didn't tolerate it and switched to Praluent    Current Problems (verified) Patient Active Problem List   Diagnosis Date Noted  . Allergic rhinitis 01/06/2020  . Statin Intolerance 12/12/2019  . OSA (obstructive sleep apnea) 02/26/2018  . COPD GOLD A-B 12/02/2017  . Anxiety 10/22/2017  . Former smoker 10/22/2017  . Aortic atherosclerosis by Abd CT scan ([place]) 05/16/2017  . GERD (gastroesophageal reflux disease) 04/10/2017  . Other abnormal glucose  03/20/2014  . Essential hypertension 06/15/2013  . Hyperlipidemia, mixed 06/15/2013  . Vitamin D deficiency 06/15/2013    SURGICAL HISTORY She  has a past surgical history that includes Appendectomy; Partial hysterectomy; Lumbar fusion (02/06/2012); Back surgery; Dilation and curettage of uterus; Total hip arthroplasty (06/02/2012); Total knee arthroplasty (Right, 12/15/2012); Total hip arthroplasty (Left, 01/25/2016); Total hip arthroplasty (Left, 01/25/2016); Total knee arthroplasty (Left, 06/18/2016); Colonoscopy; Upper gastrointestinal endoscopy (05/06/2019); Polypectomy; and Cataract extraction (Left, 2019). FAMILY HISTORY Her family history includes CAD in her father; Cancer in her maternal grandmother; Colon cancer (age of onset: 17) in her sister; Colon cancer (age of onset: 33) in her mother; Diabetes in her mother; Hypertension in her brother, brother, and father; Lung cancer (age of onset: 83) in her brother; Peripheral Artery Disease in her paternal grandfather. SOCIAL HISTORY She  reports that she quit smoking about 6 months ago. Her smoking use included cigarettes. She started smoking about 36 years ago. She has a 35.00 pack-year smoking history. She has never used smokeless tobacco. She reports current alcohol use. She reports current drug use. Drug: Marijuana.    Review of Systems  Constitutional: Negative for malaise/fatigue and weight loss.  HENT: Negative for congestion, hearing loss, sore throat and tinnitus.   Eyes: Negative for blurred vision and double vision.  Respiratory: Negative for cough (improved), sputum production, shortness of breath and wheezing.   Cardiovascular: Negative for chest pain, palpitations, orthopnea, claudication, leg swelling and PND.  Gastrointestinal: Negative for abdominal pain, blood in stool, constipation, diarrhea, heartburn, melena, nausea and vomiting.  Genitourinary: Negative.   Musculoskeletal: Negative for falls, joint pain and myalgias.   Skin: Negative for rash.  Neurological: Negative for dizziness, tingling, sensory change, weakness and headaches.  Endo/Heme/Allergies: Negative for environmental allergies and polydipsia.  Psychiatric/Behavioral: Negative.  Negative for depression, memory loss, substance abuse and suicidal ideas. The patient is not nervous/anxious and does not have insomnia.   All other systems reviewed and are negative.    Objective:     Today's Vitals   05/24/20 1043  BP: 120/66  Pulse: 68  Temp: (!) 96.6 F (35.9 C)  SpO2: 97%  Weight: 161 lb (73 kg)   Body mass index is 26.79 kg/m.  General appearance: alert, no distress, WD/WN, female HEENT: normocephalic, sclerae anicteric, TMs pearly, nares patent, no discharge or erythema, pharynx normal Oral cavity: MMM, no lesions Neck: supple, no lymphadenopathy, no thyromegaly, no masses Heart: RRR, normal S1, S2, no murmurs Lungs: Scattered rhonchi to bilateral bases without wheezes or  rales Abdomen: +bs, soft, non tender, non distended, no masses, no hepatomegaly, no splenomegaly Musculoskeletal: nontender, no swelling, no obvious deformity Extremities: no edema, no cyanosis, no clubbing Pulses: 2+ symmetric, upper and lower extremities, normal cap refill Neurological: alert, oriented x 3, CN2-12 intact, strength normal upper extremities and lower extremities, sensation normal throughout, DTRs 2+ throughout, no cerebellar signs, gait normal Psychiatric: normal affect, behavior normal, pleasant    Izora Ribas, NP   05/24/2020

## 2020-05-24 ENCOUNTER — Encounter: Payer: Self-pay | Admitting: Adult Health

## 2020-05-24 ENCOUNTER — Ambulatory Visit (INDEPENDENT_AMBULATORY_CARE_PROVIDER_SITE_OTHER): Payer: Medicare Other | Admitting: Adult Health

## 2020-05-24 ENCOUNTER — Other Ambulatory Visit: Payer: Self-pay

## 2020-05-24 VITALS — BP 120/66 | HR 68 | Temp 96.6°F | Wt 161.0 lb

## 2020-05-24 DIAGNOSIS — G4733 Obstructive sleep apnea (adult) (pediatric): Secondary | ICD-10-CM

## 2020-05-24 DIAGNOSIS — Z23 Encounter for immunization: Secondary | ICD-10-CM | POA: Diagnosis not present

## 2020-05-24 DIAGNOSIS — E782 Mixed hyperlipidemia: Secondary | ICD-10-CM

## 2020-05-24 DIAGNOSIS — F17200 Nicotine dependence, unspecified, uncomplicated: Secondary | ICD-10-CM

## 2020-05-24 DIAGNOSIS — Z87891 Personal history of nicotine dependence: Secondary | ICD-10-CM

## 2020-05-24 DIAGNOSIS — Z6826 Body mass index (BMI) 26.0-26.9, adult: Secondary | ICD-10-CM

## 2020-05-24 DIAGNOSIS — I10 Essential (primary) hypertension: Secondary | ICD-10-CM | POA: Diagnosis not present

## 2020-05-24 DIAGNOSIS — I700 Atherosclerosis of aorta: Secondary | ICD-10-CM

## 2020-05-24 DIAGNOSIS — J432 Centrilobular emphysema: Secondary | ICD-10-CM | POA: Diagnosis not present

## 2020-05-24 DIAGNOSIS — F419 Anxiety disorder, unspecified: Secondary | ICD-10-CM

## 2020-05-24 DIAGNOSIS — R7309 Other abnormal glucose: Secondary | ICD-10-CM

## 2020-05-24 DIAGNOSIS — Z122 Encounter for screening for malignant neoplasm of respiratory organs: Secondary | ICD-10-CM

## 2020-05-24 DIAGNOSIS — E559 Vitamin D deficiency, unspecified: Secondary | ICD-10-CM

## 2020-05-24 NOTE — Addendum Note (Signed)
Addended by: Chancy Hurter on: 05/24/2020 11:28 AM   Modules accepted: Orders

## 2020-05-24 NOTE — Patient Instructions (Signed)
Goals     Exercise 150 min/wk Moderate Activity     LDL CALC < 70     Quit Smoking     Weight (lb) < 150 lb (68 kg)

## 2020-05-25 ENCOUNTER — Telehealth: Payer: Self-pay | Admitting: Pharmacist

## 2020-05-25 DIAGNOSIS — I10 Essential (primary) hypertension: Secondary | ICD-10-CM

## 2020-05-25 LAB — COMPLETE METABOLIC PANEL WITH GFR
AG Ratio: 1.7 (calc) (ref 1.0–2.5)
ALT: 12 U/L (ref 6–29)
AST: 17 U/L (ref 10–35)
Albumin: 4.3 g/dL (ref 3.6–5.1)
Alkaline phosphatase (APISO): 78 U/L (ref 37–153)
BUN: 15 mg/dL (ref 7–25)
CO2: 29 mmol/L (ref 20–32)
Calcium: 9.7 mg/dL (ref 8.6–10.4)
Chloride: 103 mmol/L (ref 98–110)
Creat: 0.82 mg/dL (ref 0.50–0.99)
GFR, Est African American: 85 mL/min/{1.73_m2} (ref >=60)
GFR, Est Non African American: 73 mL/min/{1.73_m2} (ref >=60)
Globulin: 2.6 g/dL (calc) (ref 1.9–3.7)
Glucose, Bld: 93 mg/dL (ref 65–99)
Potassium: 4 mmol/L (ref 3.5–5.3)
Sodium: 140 mmol/L (ref 135–146)
Total Bilirubin: 0.7 mg/dL (ref 0.2–1.2)
Total Protein: 6.9 g/dL (ref 6.1–8.1)

## 2020-05-25 LAB — CBC WITH DIFFERENTIAL/PLATELET
Absolute Monocytes: 518 cells/uL (ref 200–950)
Basophils Absolute: 53 cells/uL (ref 0–200)
Basophils Relative: 0.7 %
Eosinophils Absolute: 120 cells/uL (ref 15–500)
Eosinophils Relative: 1.6 %
HCT: 38.1 % (ref 35.0–45.0)
Hemoglobin: 12.6 g/dL (ref 11.7–15.5)
Lymphs Abs: 2558 cells/uL (ref 850–3900)
MCH: 27.6 pg (ref 27.0–33.0)
MCHC: 33.1 g/dL (ref 32.0–36.0)
MCV: 83.4 fL (ref 80.0–100.0)
MPV: 10.1 fL (ref 7.5–12.5)
Monocytes Relative: 6.9 %
Neutro Abs: 4253 cells/uL (ref 1500–7800)
Neutrophils Relative %: 56.7 %
Platelets: 302 10*3/uL (ref 140–400)
RBC: 4.57 10*6/uL (ref 3.80–5.10)
RDW: 13.5 % (ref 11.0–15.0)
Total Lymphocyte: 34.1 %
WBC: 7.5 10*3/uL (ref 3.8–10.8)

## 2020-05-25 LAB — TSH: TSH: 0.52 mIU/L (ref 0.40–4.50)

## 2020-05-25 LAB — MAGNESIUM: Magnesium: 2.1 mg/dL (ref 1.5–2.5)

## 2020-05-25 LAB — LIPID PANEL
Cholesterol: 203 mg/dL — ABNORMAL HIGH (ref <200)
HDL: 97 mg/dL (ref >=50)
LDL Cholesterol (Calc): 86 mg/dL (calc)
Non-HDL Cholesterol (Calc): 106 mg/dL (calc) (ref <130)
Total CHOL/HDL Ratio: 2.1 (calc) (ref <5.0)
Triglycerides: 107 mg/dL (ref <150)

## 2020-05-25 NOTE — Telephone Encounter (Signed)
LDL and TG improved from last year, but LDL remains slightly above goal.  Patient reports compliance with therapy, but admits few "late" doses.   Will continue current regimen withou changes, decrease animal based products in diet as possible, and repeat fasting lipid panel in 6 months.  *Noted patient intolerance to statins and Repatha*

## 2020-06-03 ENCOUNTER — Other Ambulatory Visit: Payer: Self-pay | Admitting: Internal Medicine

## 2020-06-03 DIAGNOSIS — H34239 Retinal artery branch occlusion, unspecified eye: Secondary | ICD-10-CM

## 2020-07-11 ENCOUNTER — Other Ambulatory Visit: Payer: Self-pay | Admitting: *Deleted

## 2020-07-11 DIAGNOSIS — Z87891 Personal history of nicotine dependence: Secondary | ICD-10-CM

## 2020-08-23 NOTE — Progress Notes (Signed)
Shared Decision Making Visit Lung Cancer Screening Program [phone])   Eligibility:  Age 70 y.o.  Pack Years Smoking History Calculation 42 pack year  (# packs/per year x # years smoked)  Recent History of coughing up blood  no  Unexplained weight loss? no ( >Than 15 pounds within the last 6 months )  Prior History Lung / other cancer no (Diagnosis within the last 5 years already requiring surveillance chest CT Scans).  Smoking Status Former Smoker  Former Smokers: Years since quit: < 1 year  Quit Date: 10/2019  Visit Components:  Discussion included one or more decision making aids. yes  Discussion included risk/benefits of screening. yes  Discussion included potential follow up diagnostic testing for abnormal scans. yes  Discussion included meaning and risk of over diagnosis. yes  Discussion included meaning and risk of False Positives. yes  Discussion included meaning of total radiation exposure. yes  Counseling Included:  Importance of adherence to annual lung cancer LDCT screening. yes  Impact of comorbidities on ability to participate in the program. yes  Ability and willingness to under diagnostic treatment. yes  Smoking Cessation Counseling:  Current Smokers:   Discussed importance of smoking cessation. yes  Information about tobacco cessation classes and interventions provided to patient. yes  Patient provided with "ticket" for LDCT Scan. yes  Symptomatic Patient. no  CounselingNA  Diagnosis Code: Tobacco Use Z72.0  Asymptomatic Patient yes  Counseling (Intermediate counseling: > three minutes counseling) [id number]  Former Smokers:   Discussed the importance of maintaining cigarette abstinence. yes  Diagnosis Code: Personal History of Nicotine Dependence. T59.741  Information about tobacco cessation classes and interventions provided to patient. Yes  Patient provided with "ticket" for LDCT Scan. yes  Written Order for Lung Cancer Screening  with LDCT placed in Epic. Yes (CT Chest Lung Cancer Screening Low Dose W/O CM) [id number] Z12.2-Screening of respiratory organs Z87.891-Personal history of nicotine dependence  BP 132/72 (BP Location: Left Arm, Cuff Size: Normal)   Pulse 71   Temp 97.9 F (36.6 C) (Oral)   Ht 5\' 4"  (1.626 m)   Wt 170 lb 1.6 oz (77.2 kg)   SpO2 96%   BMI 29.20 kg/m    I spent 25 minutes of face to face time with [name] discussing the risks and benefits of lung cancer screening. We viewed a power point together that explained in detail the above noted topics. We took the time to pause the power point at intervals to allow for questions to be asked and answered to ensure understanding. We discussed that she had taken the single most powerful action possible to decrease her risk of developing lung cancer when she quit smoking. I counseled her to remain smoke free, and to contact me if she ever had the desire to smoke again so that I can provide resources and tools to help support the effort to remain smoke free. We discussed the time and location of the scan, and that either  Doroteo Glassman RN or I will call with the results within  24-48 hours of receiving them. She has my card and contact information in the event she needs to speak with me, in addition to a copy of the power point we reviewed as a resource. She verbalized understanding of all of the above and had no further questions upon leaving the office.   Pt is a current non smoker of cigarettes. She quit 08/2019  She does smoke 1 joint of mariajuana daily for her arthritis.  I explained to the patient that there has been a high incidence of coronary artery disease noted on these exams. I explained that this is a non-gated exam therefore degree or severity cannot be determined. This patient is not on statin therapy. I have asked the patient to follow-up with their PCP regarding any incidental finding of coronary artery disease and management with diet or  medication as they feel is clinically indicated. The patient verbalized understanding of the above and had no further questions.     Sherri Spatz, NP 08/24/2020 10:02 AM

## 2020-08-23 NOTE — Progress Notes (Signed)
Virtual Visit via Video Note   This visit type was conducted due to national recommendations for restrictions regarding the COVID-19 Pandemic (e.g. social distancing) in an effort to limit this patient's exposure and mitigate transmission in our community.  Due to her co-morbid illnesses, this patient is at least at moderate risk for complications without adequate follow up.  This format is felt to be most appropriate for this patient at this time.  All issues noted in this document were discussed and addressed.  A limited physical exam was performed with this format.  Please refer to the patient's chart for her consent to telehealth for [facility]      Date:  08/25/2020   ID:  [name], DOB [birth year], MRN [id number]  Patient Location:Home Provider Location: Home  PCP:  Unk Pinto, MD  Cardiologist:  Dr Stanford Breed  Evaluation Performed:  Follow-Up Visit  Chief Complaint:  FU CAD  History of Present Illness:    FU CAD.Stress echocardiogram October 2018 showed no stress-induced wall motion abnormalities. Chest CT November 2020 showed coronary artery calcification and aortic atherosclerosis as well as emphysema.  Echo 5/21 showed normal LV function, mild LVH, grade 1 DD, mild AI. Carotid Dopplers 6/21 showed 1-39% bilateral stenosis. Since last seen,  she denies dyspnea, chest pain, palpitations or syncope.  The patient does not have symptoms concerning for COVID-19 infection (fever, chills, cough, or new shortness of breath).    Past Medical History:  Diagnosis Date  . Allergy    enviromental  . Anxiety   . Arthritis    osteoarthritis  . COPD (chronic obstructive pulmonary disease) (HCC)    mild  . Diabetes mellitus without complication ([place])    controlled by diet  . Environmental allergies   . GERD (gastroesophageal reflux disease)   . Hepatitis    hepatitis A  . History of colon polyps   . Hyperlipidemia   . Hypertension   . IBS (irritable bowel  syndrome)   . Mixed hyperlipidemia 06/15/2013  . PONV (postoperative nausea and vomiting)    second surgery no problems last surgery  . Solitary pulmonary nodule 06/02/2019  . Urgency of urination    Takes oxybutynin; under control at this time   Past Surgical History:  Procedure Laterality Date  . APPENDECTOMY     removed as child  . BACK SURGERY     lumbar fusion  . CATARACT EXTRACTION Left 2019  . COLONOSCOPY    . DILATION AND CURETTAGE OF UTERUS    . LUMBAR FUSION  02/06/2012   L 4  L5  . PARTIAL HYSTERECTOMY    . POLYPECTOMY    . TOTAL HIP ARTHROPLASTY  06/02/2012   Procedure: TOTAL HIP ARTHROPLASTY;  Surgeon: Kerin Salen, MD;  Location: [place];  Service: Orthopedics;  Laterality: Right;  . TOTAL HIP ARTHROPLASTY Left 01/25/2016  . TOTAL HIP ARTHROPLASTY Left 01/25/2016   Procedure: LEFT TOTAL HIP ARTHROPLASTY;  Surgeon: Frederik Pear, MD;  Location: [place];  Service: Orthopedics;  Laterality: Left;  . TOTAL KNEE ARTHROPLASTY Right 12/15/2012   Procedure: TOTAL KNEE ARTHROPLASTY;  Surgeon: Kerin Salen, MD;  Location: [place];  Service: Orthopedics;  Laterality: Right;  . TOTAL KNEE ARTHROPLASTY Left 06/18/2016   Procedure: TOTAL KNEE ARTHROPLASTY;  Surgeon: Frederik Pear, MD;  Location: [place];  Service: Orthopedics;  Laterality: Left;  . UPPER GASTROINTESTINAL ENDOSCOPY  05/06/2019     Current Meds  Medication Sig  . Alirocumab (PRALUENT) 150 MG/ML SOAJ Inject  150 mg into the skin every 14 (fourteen) days.  . Ascorbic Acid (VITAMIN C) 1000 MG tablet Take 1,000 mg by mouth daily.  Marland Kitchen aspirin 81 MG tablet Take 81 mg by mouth every other day.   . bisoprolol-hydrochlorothiazide (ZIAC) 10-6.25 MG tablet TAKE 1/2 TABLET BY MOUTH DAILY  . buPROPion (WELLBUTRIN XL) 300 MG 24 hr tablet Take 1 tablet (300 mg total) by mouth daily.  . Cholecalciferol (VITAMIN D3) 5000 UNITS TABS Take 5,000 Units by mouth daily.  . clonazePAM (KLONOPIN) 0.5 MG tablet Take 1/2 to 1 tablet as needed  .  clopidogrel (PLAVIX) 75 MG tablet Take     1 tablet     Daily      to Prevent Blood Clots  . Cyanocobalamin (VITAMIN B12 PO) Take 1,000 mcg by mouth daily.  Marland Kitchen dextromethorphan-guaiFENesin (MUCINEX DM) 30-600 MG 12hr tablet Take 1 tablet by mouth as needed.   . fluconazole (DIFLUCAN) 150 MG tablet Take 1 tablet 2 x /week as needed for Yeast Infection  . fluticasone (FLONASE) 50 MCG/ACT nasal spray 1 Spray each nostril daily  . losartan (COZAAR) 100 MG tablet TAKE 1 TABLET BY MOUTH DAILY FOR BLOOD PRESSURE  . methylcellulose oral powder Take by mouth daily.   . montelukast (SINGULAIR) 10 MG tablet TAKE 1 TABLET(10 MG) BY MOUTH DAILY  . Multiple Vitamins-Minerals (MULTI COMPLETE PO) Take 1 capsule by mouth daily.  Marland Kitchen NIFEdipine (PROCARDIA XL/NIFEDICAL XL) 60 MG 24 hr tablet Take 120 mg by mouth daily.  . Omega-3 Fatty Acids (FISH OIL) 1000 MG CAPS Take 1 capsule (1,000 mg total) by mouth 2 (two) times daily with a meal.  . omeprazole (PRILOSEC) 40 MG capsule Take 1 capsule daily for Indigestion & Heartburn  . Tiotropium Bromide Monohydrate (SPIRIVA RESPIMAT IN) Inhale 1 puff into the lungs 2 (two) times daily.  . Zinc 50 MG TABS Take by mouth daily.     Allergies:   Enalapril, Statins, and Repatha [evolocumab]   Social History   Tobacco Use  . Smoking status: Former Smoker    Packs/day: 1.00    Years: 35.00    Pack years: 35.00    Types: Cigarettes    Start date: 3    Quit date: 11/02/2019    Years since quitting: 0.8  . Smokeless tobacco: Never Used  . Tobacco comment: Quit 10/05/2019   Vaping Use  . Vaping Use: Never used  Substance Use Topics  . Alcohol use: Yes    Comment: 2-4 beers per night, advised to cut back   . Drug use: Yes    Types: Marijuana    Comment: current, 5 joints/month      Family Hx: The patient's family history includes CAD in her father; Cancer in her maternal grandmother; Colon cancer (age of onset: 28) in her sister; Colon cancer (age of onset: 3) in  her mother; Diabetes in her mother; Hypertension in her brother, brother, and father; Lung cancer (age of onset: 40) in her brother; Peripheral Artery Disease in her paternal grandfather. There is no history of Esophageal cancer, Rectal cancer, or Stomach cancer.  ROS:   Please see the history of present illness.    No Fever, chills  or productive cough All other systems reviewed and are negative.   Recent Labs: 05/24/2020: ALT 12; BUN 15; Creat 0.82; Hemoglobin 12.6; Magnesium 2.1; Platelets 302; Potassium 4.0; Sodium 140; TSH 0.52   Recent Lipid Panel Lab Results  Component Value Date/Time   CHOL 203 (H) 05/24/2020  11:26 AM   TRIG 107 05/24/2020 11:26 AM   HDL 97 05/24/2020 11:26 AM   CHOLHDL 2.1 05/24/2020 11:26 AM   LDLCALC 86 05/24/2020 11:26 AM    Wt Readings from Last 3 Encounters:  08/25/20 170 lb (77.1 kg)  08/24/20 170 lb 1.6 oz (77.2 kg)  05/24/20 161 lb (73 kg)     Objective:    Vital Signs:  BP 132/64   Pulse 69   Ht 5\' 5"  (1.651 m)   Wt 170 lb (77.1 kg)   BMI 28.29 kg/m    VITAL SIGNS:  reviewed NAD Answers questions appropriately Normal affect Remainder of physical examination not performed (telehealth visit; coronavirus pandemic)  ASSESSMENT & PLAN:    1. CAD-based on chest CT with coronary calcium; continue ASA and praulent. 2. History of retinal artery occlusion-patient is on aspirin and Plavix (Plavix initiated by Dr. Melford Aase). 3. Hypertension-BP controlled; continue present meds and follow. 4. Hyperlipidemia-intolerant to statins; continue praulent. 5. Tobacco abuse-pt has discontinued.  COVID-19 Education: The importance of social distancing was discussed today.  Time:   Today, I have spent 14 minutes with the patient with telehealth technology discussing the above problems.     Medication Adjustments/Labs and Tests Ordered: Current medicines are reviewed at length with the patient today.  Concerns regarding medicines are outlined above.    Tests Ordered: No orders of the defined types were placed in this encounter.   Medication Changes: No orders of the defined types were placed in this encounter.   Follow Up:  In Person in 1 year(s)  Signed, Kirk Ruths, MD  08/25/2020 8:38 AM    [place]

## 2020-08-24 ENCOUNTER — Other Ambulatory Visit: Payer: Self-pay

## 2020-08-24 ENCOUNTER — Ambulatory Visit (INDEPENDENT_AMBULATORY_CARE_PROVIDER_SITE_OTHER): Payer: Medicare Other | Admitting: Acute Care

## 2020-08-24 ENCOUNTER — Encounter: Payer: Self-pay | Admitting: Acute Care

## 2020-08-24 ENCOUNTER — Ambulatory Visit
Admission: RE | Admit: 2020-08-24 | Discharge: 2020-08-24 | Disposition: A | Discharge status: Home / Self Care | Payer: Medicare Other | Source: Ambulatory Visit | Attending: Acute Care | Admitting: Acute Care

## 2020-08-24 VITALS — BP 132/72 | HR 71 | Temp 97.9°F | Ht 64.0 in | Wt 170.1 lb

## 2020-08-24 DIAGNOSIS — Z87891 Personal history of nicotine dependence: Secondary | ICD-10-CM

## 2020-08-24 DIAGNOSIS — Z122 Encounter for screening for malignant neoplasm of respiratory organs: Secondary | ICD-10-CM

## 2020-08-24 DIAGNOSIS — M47814 Spondylosis without myelopathy or radiculopathy, thoracic region: Secondary | ICD-10-CM | POA: Diagnosis not present

## 2020-08-24 DIAGNOSIS — J432 Centrilobular emphysema: Secondary | ICD-10-CM | POA: Diagnosis not present

## 2020-08-24 DIAGNOSIS — I2510 Atherosclerotic heart disease of native coronary artery without angina pectoris: Secondary | ICD-10-CM | POA: Diagnosis not present

## 2020-08-24 DIAGNOSIS — F17210 Nicotine dependence, cigarettes, uncomplicated: Secondary | ICD-10-CM | POA: Diagnosis not present

## 2020-08-24 NOTE — Patient Instructions (Signed)
Thank you for participating in the [facility]. It was our pleasure to meet you today. We will call you with the results of your scan within the next few days. Your scan will be assigned a Lung RADS category score by the physicians reading the scans.  This Lung RADS score determines follow up scanning.  See below for description of categories, and follow up screening recommendations. We will be in touch to schedule your follow up screening annually or based on recommendations of our providers. We will fax a copy of your scan results to your Primary Care Physician, or the physician who referred you to the program, to ensure they have the results. Please call the office if you have any questions or concerns regarding your scanning experience or results.  Our office number is [phone]. Please speak with Denise Phelps, RN. She is our Lung Cancer Screening RN. If she is unavailable when you call, please have the office staff send her a message. She will return your call at her earliest convenience. Remember, if your scan is normal, we will scan you annually as long as you continue to meet the criteria for the program. (Age 55-77, Current smoker or smoker who has quit within the last 15 years). If you are a smoker, remember, quitting is the single most powerful action that you can take to decrease your risk of lung cancer and other pulmonary, breathing related problems. We know quitting is hard, and we are here to help.  Please let us know if there is anything we can do to help you meet your goal of quitting. If you are a former smoker, congratulations. We are proud of you! Remain smoke free! Remember you can refer friends or family members through the number above.  We will screen them to make sure they meet criteria for the program. Thank you for helping us take better care of you by participating in Lung Screening.  Lung RADS Categories:  Lung RADS 1: no nodules  or definitely non-concerning nodules.  Recommendation is for a repeat annual scan in 12 months.  Lung RADS 2:  nodules that are non-concerning in appearance and behavior with a very low likelihood of becoming an active cancer. Recommendation is for a repeat annual scan in 12 months.  Lung RADS 3: nodules that are probably non-concerning , includes nodules with a low likelihood of becoming an active cancer.  Recommendation is for a 6-month repeat screening scan. Often noted after an upper respiratory illness. We will be in touch to make sure you have no questions, and to schedule your 6-month scan.  Lung RADS 4 A: nodules with concerning findings, recommendation is most often for a follow up scan in 3 months or additional testing based on our provider's assessment of the scan. We will be in touch to make sure you have no questions and to schedule the recommended 3 month follow up scan.  Lung RADS 4 B:  indicates findings that are concerning. We will be in touch with you to schedule additional diagnostic testing based on our provider's  assessment of the scan.   

## 2020-08-25 ENCOUNTER — Telehealth (INDEPENDENT_AMBULATORY_CARE_PROVIDER_SITE_OTHER): Payer: Medicare Other | Admitting: Cardiology

## 2020-08-25 ENCOUNTER — Encounter: Payer: Self-pay | Admitting: Cardiology

## 2020-08-25 VITALS — BP 132/64 | HR 69 | Ht 65.0 in | Wt 170.0 lb

## 2020-08-25 DIAGNOSIS — I10 Essential (primary) hypertension: Secondary | ICD-10-CM | POA: Diagnosis not present

## 2020-08-25 DIAGNOSIS — I2510 Atherosclerotic heart disease of native coronary artery without angina pectoris: Secondary | ICD-10-CM

## 2020-08-25 DIAGNOSIS — E7800 Pure hypercholesterolemia, unspecified: Secondary | ICD-10-CM | POA: Diagnosis not present

## 2020-08-25 NOTE — Patient Instructions (Signed)

## 2020-08-30 NOTE — Progress Notes (Signed)
Please call patient and let them  know their  low dose Ct was read as a Lung RADS 2: nodules that are benign in appearance and behavior with a very low likelihood of becoming a clinically active cancer due to size or lack of growth. Recommendation per radiology is for a repeat LDCT in 12 months. .Please let them  know we will order and schedule their  annual screening scan for 08/2021. Please let them  know there was notation of CAD on their  scan.  Please remind the patient  that this is a non-gated exam therefore degree or severity of disease  cannot be determined. Please have them  follow up with their PCP regarding potential risk factor modification, dietary therapy or pharmacologic therapy if clinically indicated. Pt.  is  currently on not statin therapy. Please place order for annual  screening scan for  08/2021 and fax results to PCP. Thanks so much.  Please have patient follow up with PCP re: statin therapy for CAD. Thanks so much

## 2020-08-31 DIAGNOSIS — H34232 Retinal artery branch occlusion, left eye: Secondary | ICD-10-CM | POA: Diagnosis not present

## 2020-09-01 NOTE — Progress Notes (Signed)
History of Present Illness:       This very nice 70 y.o.  married [place]  presents for 6 month follow up with HTN, HLD, Pre-Diabetes and Vitamin D Deficiency. Patient has GERD controlled on her meds.       Patient is treated for HTN (1988) & BP has been controlled at home. Today's BP  Is at goal -  120/62.  Patient has hx/o Aortic ADS & Cor Aa calcifications and has had a negative Stress Test.  Patient has had no complaints of any cardiac type chest pain, palpitations, dyspnea / orthopnea / PND, dizziness, claudication, or dependent edema.      Patient has hx/o Hyperlipidemia and was intolerant of Statins and also Repatha, but has tolerated Praulent is controlling her Lipids. Patient denies myalgias or other med SE's. Last Lipids were at goal with very high HDL (97) and LDL (86) at goal:  Lab Results  Component Value Date   CHOL 203 (H) 05/24/2020   HDL 97 05/24/2020   LDLCALC 86 05/24/2020   TRIG 107 05/24/2020   CHOLHDL 2.1 05/24/2020    Also, the patient has history of PreDiabetes (A1c 5.7% /2017) and has had no symptoms of reactive hypoglycemia, diabetic polys, paresthesias or visual blurring.  Last A1c was normal & at goal:  Lab Results  Component Value Date   HGBA1C 5.6 02/17/2020           Further, the patient also has history of Vitamin D Deficiency and supplements vitamin D without any suspected side-effects. Last vitamin D was at goal:  Lab Results  Component Value Date   VD25OH 67 02/17/2020    Current Outpatient Medications on File Prior to Visit  Medication Sig  . PRALUENT 150 MG/ML  Inject 150 mg  every 14  days.  Marland Kitchen VITAMIN C 1000 MG tablet Take  daily.  Marland Kitchen aspirin 81 MG tablet Take  every other day.   . bisoprolol-hctz 10-6.25 MG tablet TAKE 1/2 TABLET  DAILY  . buPROPion-XL 300 MG  Take 1 tablet daily.  Marland Kitchen VITAMIN D 5000 UNITS  Take  daily.  . clonazePAM 0.5 MG tablet Take 1/2 to 1 tablet as needed  . PLAVIX75 MG tablet Take  \1 tablet\Daily \   . VITAMIN B12 tab 1,000 mcg  Take\ daily.  Marland Kitchen MUCINEX DM 30-600 MG 12hr  Take 1 tablet as needed.   Marland Kitchen DIFLUCAN 150 MG tablet Take 1 tablet 2 x /week as needed for Yeast Infection  . FLONASE  nasal spray 1 Spray each nostril daily  . losartan  100 MG tablet TAKE 1 TABLET  DAILY   . methylcellulose oral powder Take  daily.   . montelukast  10 MG tablet TAKE 1 TABLET DAILY  . Multi-Vit-Minerals  Take 1 capsule  daily.  Marland Kitchen NIFEdipine  XL 60 MG  Take 120 mg by mouth daily.  . Omega-3 FISH OIL 1000 MG  Take 1 capsule  2 times daily   . omeprazole  40 MG capsule Take 1 capsule daily f  . SPIRIVA RESPIMAT  Inhale 1 puff  2  times daily.  . Zinc 50 MG TABS Take  daily.    Allergies  Allergen Reactions  . Enalapril Swelling    Caused lips to swell and bloating.  . Statins     Tingling with several statins  . Repatha [Evolocumab]     Stated she didn't tolerate it and switched to Praluent  PMHx:   Past Medical History:  Diagnosis Date  . Allergy    enviromental  . Anxiety   . Arthritis    osteoarthritis  . COPD (chronic obstructive pulmonary disease) (HCC)    mild  . Diabetes mellitus without complication ([place])    controlled by diet  . Environmental allergies   . GERD (gastroesophageal reflux disease)   . Hepatitis    hepatitis A  . History of colon polyps   . Hyperlipidemia   . Hypertension   . IBS (irritable bowel syndrome)   . Mixed hyperlipidemia 06/15/2013  . PONV (postoperative nausea and vomiting)    second surgery no problems last surgery  . Solitary pulmonary nodule 06/02/2019  . Urgency of urination    Takes oxybutynin; under control at this time    Immunization History  Administered Date(s) Administered  . Influenza, High Dose Seasonal PF 04/10/2017, 05/08/2019, 05/24/2020  . Influenza-Unspecified 03/05/2018  . PFIZER(Purple Top)SARS-COV-2 Vaccination 08/13/2019, 09/09/2019  . PPD Test 09/21/2014  . Pneumococcal Conjugate-13 10/22/2017  . Pneumococcal  Polysaccharide-23 02/07/2011, 01/26/2016  . Tdap 02/07/2011    Past Surgical History:  Procedure Laterality Date  . APPENDECTOMY     removed as child  . BACK SURGERY     lumbar fusion  . CATARACT EXTRACTION Left 2019  . COLONOSCOPY    . DILATION AND CURETTAGE OF UTERUS    . LUMBAR FUSION  02/06/2012   L 4  L5  . PARTIAL HYSTERECTOMY    . POLYPECTOMY    . TOTAL HIP ARTHROPLASTY  06/02/2012   Procedure: TOTAL HIP ARTHROPLASTY;  Surgeon: Kerin Salen, MD;  Location: [place];  Service: Orthopedics;  Laterality: Right;  . TOTAL HIP ARTHROPLASTY Left 01/25/2016  . TOTAL HIP ARTHROPLASTY Left 01/25/2016   Procedure: LEFT TOTAL HIP ARTHROPLASTY;  Surgeon: Frederik Pear, MD;  Location: [place];  Service: Orthopedics;  Laterality: Left;  . TOTAL KNEE ARTHROPLASTY Right 12/15/2012   Procedure: TOTAL KNEE ARTHROPLASTY;  Surgeon: Kerin Salen, MD;  Location: [place];  Service: Orthopedics;  Laterality: Right;  . TOTAL KNEE ARTHROPLASTY Left 06/18/2016   Procedure: TOTAL KNEE ARTHROPLASTY;  Surgeon: Frederik Pear, MD;  Location: [place];  Service: Orthopedics;  Laterality: Left;  . UPPER GASTROINTESTINAL ENDOSCOPY  05/06/2019    FHx:    Reviewed / unchanged  SHx:    Reviewed / unchanged   Systems Review:  Constitutional: Denies fever, chills, wt changes, headaches, insomnia, fatigue, night sweats, change in appetite. Eyes: Denies redness, blurred vision, diplopia, discharge, itchy, watery eyes.  ENT: Denies discharge, congestion, post nasal drip, epistaxis, sore throat, earache, hearing loss, dental pain, tinnitus, vertigo, sinus pain, snoring.  CV: Denies chest pain, palpitations, irregular heartbeat, syncope, dyspnea, diaphoresis, orthopnea, PND, claudication or edema. Respiratory: denies cough, dyspnea, DOE, pleurisy, hoarseness, laryngitis, wheezing.  Gastrointestinal: Denies dysphagia, odynophagia, heartburn, reflux, water brash, abdominal pain or cramps, nausea, vomiting, bloating, diarrhea,  constipation, hematemesis, melena, hematochezia  or hemorrhoids. Genitourinary: Denies dysuria, frequency, urgency, nocturia, hesitancy, discharge, hematuria or flank pain. Musculoskeletal: Denies arthralgias, myalgias, stiffness, jt. swelling, pain, limping or strain/sprain.  Skin: Denies pruritus, rash, hives, warts, acne, eczema or change in skin lesion(s). Neuro: No weakness, tremor, incoordination, spasms, paresthesia or pain. Psychiatric: Denies confusion, memory loss or sensory loss. Endo: Denies change in weight, skin or hair change.  Heme/Lymph: No excessive bleeding, bruising or enlarged lymph nodes.  Physical Exam  BP 120/62   Pulse 62   Temp 97.6 F (36.4 C)   Wt  169 lb (76.7 kg)   SpO2 97%   BMI 28.12 kg/m   Appears  well nourished, well groomed  and in no distress.  Eyes: PERRLA, EOMs, conjunctiva no swelling or erythema. Sinuses: No frontal/maxillary tenderness ENT/Mouth: EAC's clear, TM's nl w/o erythema, bulging. Nares clear w/o erythema, swelling, exudates. Oropharynx clear without erythema or exudates. Oral hygiene is good. Tongue normal, non obstructing. Hearing intact.  Neck: Supple. Thyroid not palpable. Car 2+/2+ without bruits, nodes or JVD. Chest: Respirations nl with BS clear & equal w/o rales, rhonchi, wheezing or stridor.  Cor: Heart sounds normal w/ regular rate and rhythm without sig. murmurs, gallops, clicks or rubs. Peripheral pulses normal and equal  without edema.  Abdomen: Soft & bowel sounds normal. Non-tender w/o guarding, rebound, hernias, masses or organomegaly.  Lymphatics: Unremarkable.  Musculoskeletal: Full ROM all peripheral extremities, joint stability, 5/5 strength and normal gait.  Skin: Warm, dry without exposed rashes, lesions or ecchymosis apparent.  Neuro: Cranial nerves intact, reflexes equal bilaterally. Sensory-motor testing grossly intact. Tendon reflexes grossly intact.  Pysch: Alert & oriented x 3.  Insight and judgement nl &  appropriate. No ideations.  Assessment and Plan:  1. Essential hypertension  - Continue medication, monitor blood pressure at home.  - Continue DASH diet.  Reminder to go to the ER if any CP,  SOB, nausea, dizziness, severe HA, changes vision/speech.  - CBC with Differential/Platelet - COMPLETE METABOLIC PANEL WITH GFR - Magnesium  2. Hyperlipidemia, mixed  - Continue diet/meds, exercise,& lifestyle modifications.  - Continue monitor periodic cholesterol/liver & renal functions   - Lipid panel - TSH  3. Abnormal glucose  - Continue diet, exercise  - Lifestyle modifications.  - Monitor appropriate labs  - Hemoglobin A1c - Insulin, random  4. Vitamin D deficiency  - Continue supplementation.  - VITAMIN D 25 Hydroxy   5. Statin intolerance   6. Aortic atherosclerosis by Abd CT scan [facility])  - Lipid panel  7. Gastroesophageal reflux disease  - CBC with Differential/Platelet  8. Medication management  - CBC with Differential/Platelet - COMPLETE METABOLIC PANEL WITH GFR - Magnesium - Lipid panel - TSH - Hemoglobin A1c - Insulin, random - VITAMIN D 25 Hydroxxyl         Discussed  regular exercise, BP monitoring, weight control to achieve/maintain BMI less than 25 and discussed med and SE's. Recommended labs to assess and monitor clinical status with further disposition pending results of labs.  I discussed the assessment and treatment plan with the patient. The patient was provided an opportunity to ask questions and all were answered. The patient agreed with the plan and demonstrated an understanding of the instructions.  I provided over 30 minutes of exam, counseling, chart review and  complex critical decision making.        The patient was advised to call back or seek an in-person evaluation if the symptoms worsen or if the condition fails to improve as anticipated.   Kirtland Bouchard, MD

## 2020-09-01 NOTE — Progress Notes (Incomplete)
History of Present Illness:       This very nice 70 y.o.  married [place]  presents for 6 month follow up with HTN, HLD, Pre-Diabetes and Vitamin D Deficiency. Patient has GERD controlled on her meds.       Patient is treated for HTN & BP has been controlled at home. Today's  . Patient has had no complaints of any cardiac type chest pain, palpitations, dyspnea / orthopnea / PND, dizziness, claudication, or dependent edema.      Hyperlipidemia is controlled with diet & meds. Patient denies myalgias or other med SE's. Last Lipids were at goal  With very high HDL (97) and LDL (86) at goal:  Lab Results  Component Value Date   CHOL 203 (H) 05/24/2020   HDL 97 05/24/2020   LDLCALC 86 05/24/2020   TRIG 107 05/24/2020   CHOLHDL 2.1 05/24/2020    Also, the patient has history of PreDiabetes and has had no symptoms of reactive hypoglycemia, diabetic polys, paresthesias or visual blurring.  Last A1c was normal & at goal:  Lab Results  Component Value Date   HGBA1C 5.6 02/17/2020       Further, the patient also has history of Vitamin D Deficiency and supplements vitamin D without any suspected side-effects. Last vitamin D was  Lab Results  Component Value Date   VD25OH 67 02/17/2020    Current Outpatient Medications on File Prior to Visit  Medication Sig  . PRALUENT 150 MG/ML  Inject 150 mg  every 14  days.  Marland Kitchen VITAMIN C 1000 MG tablet Take  daily.  Marland Kitchen aspirin 81 MG tablet Take  every other day.   . bisoprolol-hctz 10-6.25 MG tablet TAKE 1/2 TABLET  DAILY  . buPROPion-XL 300 MG  Take 1 tablet daily.  Marland Kitchen VITAMIN D 5000 UNITS  Take  daily.  . clonazePAM 0.5 MG tablet Take 1/2 to 1 tablet as needed  . PLAVIX75 MG tablet Take  \1 tablet\Daily \  . VITAMIN B12 tab 1,000 mcg  Take\ daily.  Marland Kitchen MUCINEX DM 30-600 MG 12hr  Take 1 tablet as needed.   Marland Kitchen DIFLUCAN 150 MG tablet Take 1 tablet 2 x /week as needed for Yeast Infection  . FLONASE  nasal spray 1 Spray each nostril daily   . losartan  100 MG tablet TAKE 1 TABLET  DAILY   . methylcellulose oral powder Take  daily.   . montelukast  10 MG tablet TAKE 1 TABLET DAILY  . Multi-Vit-Minerals  Take 1 capsule  daily.  Marland Kitchen NIFEdipine  XL 60 MG  Take 120 mg by mouth daily.  . Omega-3 FISH OIL 1000 MG  Take 1 capsule  2 times daily   . omeprazole  40 MG capsule Take 1 capsule daily f  . SPIRIVA RESPIMAT  Inhale 1 puff  2  times daily.  . Zinc 50 MG TABS Take  daily.    Allergies  Allergen Reactions  . Enalapril Swelling    Caused lips to swell and bloating.  . Statins     Tingling with several statins  . Repatha [Evolocumab]     Stated she didn't tolerate it and switched to Praluent    PMHx:   Past Medical History:  Diagnosis Date  . Allergy    enviromental  . Anxiety   . Arthritis    osteoarthritis  . COPD (chronic obstructive pulmonary disease) (HCC)    mild  . Diabetes mellitus without  complication ([place])    controlled by diet  . Environmental allergies   . GERD (gastroesophageal reflux disease)   . Hepatitis    hepatitis A  . History of colon polyps   . Hyperlipidemia   . Hypertension   . IBS (irritable bowel syndrome)   . Mixed hyperlipidemia 06/15/2013  . PONV (postoperative nausea and vomiting)    second surgery no problems last surgery  . Solitary pulmonary nodule 06/02/2019  . Urgency of urination    Takes oxybutynin; under control at this time    Immunization History  Administered Date(s) Administered  . Influenza, High Dose Seasonal PF 04/10/2017, 05/08/2019, 05/24/2020  . Influenza-Unspecified 03/05/2018  . PFIZER(Purple Top)SARS-COV-2 Vaccination 08/13/2019, 09/09/2019  . PPD Test 09/21/2014  . Pneumococcal Conjugate-13 10/22/2017  . Pneumococcal Polysaccharide-23 02/07/2011, 01/26/2016  . Tdap 02/07/2011    Past Surgical History:  Procedure Laterality Date  . APPENDECTOMY     removed as child  . BACK SURGERY     lumbar fusion  . CATARACT EXTRACTION Left 2019  .  COLONOSCOPY    . DILATION AND CURETTAGE OF UTERUS    . LUMBAR FUSION  02/06/2012   L 4  L5  . PARTIAL HYSTERECTOMY    . POLYPECTOMY    . TOTAL HIP ARTHROPLASTY  06/02/2012   Procedure: TOTAL HIP ARTHROPLASTY;  Surgeon: Kerin Salen, MD;  Location: [facility];  Service: Orthopedics;  Laterality: Right;  . TOTAL HIP ARTHROPLASTY Left 01/25/2016  . TOTAL HIP ARTHROPLASTY Left 01/25/2016   Procedure: LEFT TOTAL HIP ARTHROPLASTY;  Surgeon: Frederik Pear, MD;  Location: [facility];  Service: Orthopedics;  Laterality: Left;  . TOTAL KNEE ARTHROPLASTY Right 12/15/2012   Procedure: TOTAL KNEE ARTHROPLASTY;  Surgeon: Kerin Salen, MD;  Location: [facility];  Service: Orthopedics;  Laterality: Right;  . TOTAL KNEE ARTHROPLASTY Left 06/18/2016   Procedure: TOTAL KNEE ARTHROPLASTY;  Surgeon: Frederik Pear, MD;  Location: [facility];  Service: Orthopedics;  Laterality: Left;  . UPPER GASTROINTESTINAL ENDOSCOPY  05/06/2019    FHx:    Reviewed / unchanged  SHx:    Reviewed / unchanged   Systems Review:  Constitutional: Denies fever, chills, wt changes, headaches, insomnia, fatigue, night sweats, change in appetite. Eyes: Denies redness, blurred vision, diplopia, discharge, itchy, watery eyes.  ENT: Denies discharge, congestion, post nasal drip, epistaxis, sore throat, earache, hearing loss, dental pain, tinnitus, vertigo, sinus pain, snoring.  CV: Denies chest pain, palpitations, irregular heartbeat, syncope, dyspnea, diaphoresis, orthopnea, PND, claudication or edema. Respiratory: denies cough, dyspnea, DOE, pleurisy, hoarseness, laryngitis, wheezing.  Gastrointestinal: Denies dysphagia, odynophagia, heartburn, reflux, water brash, abdominal pain or cramps, nausea, vomiting, bloating, diarrhea, constipation, hematemesis, melena, hematochezia  or hemorrhoids. Genitourinary: Denies dysuria, frequency, urgency, nocturia, hesitancy, discharge, hematuria or flank pain. Musculoskeletal: Denies arthralgias, myalgias, stiffness, jt.  swelling, pain, limping or strain/sprain.  Skin: Denies pruritus, rash, hives, warts, acne, eczema or change in skin lesion(s). Neuro: No weakness, tremor, incoordination, spasms, paresthesia or pain. Psychiatric: Denies confusion, memory loss or sensory loss. Endo: Denies change in weight, skin or hair change.  Heme/Lymph: No excessive bleeding, bruising or enlarged lymph nodes.  Physical Exam  There were no vitals taken for this visit.  Appears  well nourished, well groomed  and in no distress.  Eyes: PERRLA, EOMs, conjunctiva no swelling or erythema. Sinuses: No frontal/maxillary tenderness ENT/Mouth: EAC's clear, TM's nl w/o erythema, bulging. Nares clear w/o erythema, swelling, exudates. Oropharynx clear without erythema or exudates. Oral hygiene is good. Tongue normal, non  obstructing. Hearing intact.  Neck: Supple. Thyroid not palpable. Car 2+/2+ without bruits, nodes or JVD. Chest: Respirations nl with BS clear & equal w/o rales, rhonchi, wheezing or stridor.  Cor: Heart sounds normal w/ regular rate and rhythm without sig. murmurs, gallops, clicks or rubs. Peripheral pulses normal and equal  without edema.  Abdomen: Soft & bowel sounds normal. Non-tender w/o guarding, rebound, hernias, masses or organomegaly.  Lymphatics: Unremarkable.  Musculoskeletal: Full ROM all peripheral extremities, joint stability, 5/5 strength and normal gait.  Skin: Warm, dry without exposed rashes, lesions or ecchymosis apparent.  Neuro: Cranial nerves intact, reflexes equal bilaterally. Sensory-motor testing grossly intact. Tendon reflexes grossly intact.  Pysch: Alert & oriented x 3.  Insight and judgement nl & appropriate. No ideations.  Assessment and Plan:  - Continue medication, monitor blood pressure at home.  - Continue DASH diet.  Reminder to go to the ER if any CP,  SOB, nausea, dizziness, severe HA, changes vision/speech.  - Continue diet/meds, exercise,& lifestyle modifications.  -  Continue monitor periodic cholesterol/liver & renal functions    - Continue diet, exercise  - Lifestyle modifications.  - Monitor appropriate labs. - Continue supplementation.       Discussed  regular exercise, BP monitoring, weight control to achieve/maintain BMI less than 25 and discussed med and SE's. Recommended labs to assess and monitor clinical status with further disposition pending results of labs.  I discussed the assessment and treatment plan with the patient. The patient was provided an opportunity to ask questions and all were answered. The patient agreed with the plan and demonstrated an understanding of the instructions.  I provided over 30 minutes of exam, counseling, chart review and  complex critical decision making.         The patient was advised to call back or seek an in-person evaluation if the symptoms worsen or if the condition fails to improve as anticipated.   Kirtland Bouchard, MD

## 2020-09-02 ENCOUNTER — Encounter: Payer: Self-pay | Admitting: Internal Medicine

## 2020-09-02 ENCOUNTER — Ambulatory Visit (INDEPENDENT_AMBULATORY_CARE_PROVIDER_SITE_OTHER): Payer: Medicare Other | Admitting: Internal Medicine

## 2020-09-02 ENCOUNTER — Other Ambulatory Visit: Payer: Self-pay

## 2020-09-02 VITALS — BP 120/62 | HR 62 | Temp 97.6°F | Ht 65.0 in | Wt 169.0 lb

## 2020-09-02 DIAGNOSIS — Z789 Other specified health status: Secondary | ICD-10-CM

## 2020-09-02 DIAGNOSIS — Z136 Encounter for screening for cardiovascular disorders: Secondary | ICD-10-CM | POA: Insufficient documentation

## 2020-09-02 DIAGNOSIS — R7309 Other abnormal glucose: Secondary | ICD-10-CM

## 2020-09-02 DIAGNOSIS — Z87891 Personal history of nicotine dependence: Secondary | ICD-10-CM

## 2020-09-02 DIAGNOSIS — E782 Mixed hyperlipidemia: Secondary | ICD-10-CM

## 2020-09-02 DIAGNOSIS — K219 Gastro-esophageal reflux disease without esophagitis: Secondary | ICD-10-CM | POA: Diagnosis not present

## 2020-09-02 DIAGNOSIS — E559 Vitamin D deficiency, unspecified: Secondary | ICD-10-CM | POA: Diagnosis not present

## 2020-09-02 DIAGNOSIS — Z8249 Family history of ischemic heart disease and other diseases of the circulatory system: Secondary | ICD-10-CM

## 2020-09-02 DIAGNOSIS — I10 Essential (primary) hypertension: Secondary | ICD-10-CM | POA: Diagnosis not present

## 2020-09-02 DIAGNOSIS — R1013 Epigastric pain: Secondary | ICD-10-CM

## 2020-09-02 DIAGNOSIS — Z79899 Other long term (current) drug therapy: Secondary | ICD-10-CM

## 2020-09-02 DIAGNOSIS — R079 Chest pain, unspecified: Secondary | ICD-10-CM

## 2020-09-02 DIAGNOSIS — I700 Atherosclerosis of aorta: Secondary | ICD-10-CM

## 2020-09-02 DIAGNOSIS — Z0001 Encounter for general adult medical examination with abnormal findings: Secondary | ICD-10-CM

## 2020-09-02 MED ORDER — OMEPRAZOLE 40 MG PO CPDR: DELAYED_RELEASE_CAPSULE | ORAL | 1 refills | Status: DC

## 2020-09-02 NOTE — Patient Instructions (Signed)

## 2020-09-05 LAB — COMPLETE METABOLIC PANEL WITH GFR
AG Ratio: 1.6 (calc) (ref 1.0–2.5)
ALT: 11 U/L (ref 6–29)
AST: 16 U/L (ref 10–35)
Albumin: 4.1 g/dL (ref 3.6–5.1)
Alkaline phosphatase (APISO): 74 U/L (ref 37–153)
BUN: 19 mg/dL (ref 7–25)
CO2: 28 mmol/L (ref 20–32)
Calcium: 9.4 mg/dL (ref 8.6–10.4)
Chloride: 106 mmol/L (ref 98–110)
Creat: 0.79 mg/dL (ref 0.50–0.99)
GFR, Est African American: 89 mL/min/{1.73_m2} (ref >=60)
GFR, Est Non African American: 76 mL/min/{1.73_m2} (ref >=60)
Globulin: 2.5 g/dL (calc) (ref 1.9–3.7)
Glucose, Bld: 99 mg/dL (ref 65–99)
Potassium: 4.3 mmol/L (ref 3.5–5.3)
Sodium: 143 mmol/L (ref 135–146)
Total Bilirubin: 0.5 mg/dL (ref 0.2–1.2)
Total Protein: 6.6 g/dL (ref 6.1–8.1)

## 2020-09-05 LAB — CBC WITH DIFFERENTIAL/PLATELET
Absolute Monocytes: 459 cells/uL (ref 200–950)
Basophils Absolute: 38 cells/uL (ref 0–200)
Basophils Relative: 0.7 %
Eosinophils Absolute: 97 cells/uL (ref 15–500)
Eosinophils Relative: 1.8 %
HCT: 39.4 % (ref 35.0–45.0)
Hemoglobin: 13 g/dL (ref 11.7–15.5)
Lymphs Abs: 2392 cells/uL (ref 850–3900)
MCH: 26.9 pg — ABNORMAL LOW (ref 27.0–33.0)
MCHC: 33 g/dL (ref 32.0–36.0)
MCV: 81.4 fL (ref 80.0–100.0)
MPV: 10.5 fL (ref 7.5–12.5)
Monocytes Relative: 8.5 %
Neutro Abs: 2414 cells/uL (ref 1500–7800)
Neutrophils Relative %: 44.7 %
Platelets: 274 10*3/uL (ref 140–400)
RBC: 4.84 10*6/uL (ref 3.80–5.10)
RDW: 13.5 % (ref 11.0–15.0)
Total Lymphocyte: 44.3 %
WBC: 5.4 10*3/uL (ref 3.8–10.8)

## 2020-09-05 LAB — LIPID PANEL
Cholesterol: 180 mg/dL (ref <200)
HDL: 82 mg/dL (ref >=50)
LDL Cholesterol (Calc): 76 mg/dL (calc)
Non-HDL Cholesterol (Calc): 98 mg/dL (calc) (ref <130)
Total CHOL/HDL Ratio: 2.2 (calc) (ref <5.0)
Triglycerides: 137 mg/dL (ref <150)

## 2020-09-05 LAB — VITAMIN D 25 HYDROXY (VIT D DEFICIENCY, FRACTURES): Vit D, 25-Hydroxy: 73 ng/mL (ref 30–100)

## 2020-09-05 LAB — HEMOGLOBIN A1C
Hgb A1c MFr Bld: 5.4 % of total Hgb (ref <5.7)
Mean Plasma Glucose: 108 mg/dL
eAG (mmol/L): 6 mmol/L

## 2020-09-05 LAB — TSH: TSH: 0.77 mIU/L (ref 0.40–4.50)

## 2020-09-05 LAB — MAGNESIUM: Magnesium: 1.9 mg/dL (ref 1.5–2.5)

## 2020-09-05 LAB — INSULIN, RANDOM: Insulin: 11.5 u[IU]/mL

## 2020-09-14 ENCOUNTER — Other Ambulatory Visit: Payer: Self-pay | Admitting: Cardiology

## 2020-09-14 ENCOUNTER — Other Ambulatory Visit: Payer: Self-pay | Admitting: Internal Medicine

## 2020-09-14 DIAGNOSIS — H34239 Retinal artery branch occlusion, unspecified eye: Secondary | ICD-10-CM

## 2020-09-14 DIAGNOSIS — I10 Essential (primary) hypertension: Secondary | ICD-10-CM

## 2020-09-15 ENCOUNTER — Other Ambulatory Visit: Payer: Self-pay | Admitting: *Deleted

## 2020-09-15 ENCOUNTER — Encounter: Payer: Self-pay | Admitting: Adult Health

## 2020-09-15 DIAGNOSIS — F17200 Nicotine dependence, unspecified, uncomplicated: Secondary | ICD-10-CM

## 2020-09-15 DIAGNOSIS — I2510 Atherosclerotic heart disease of native coronary artery without angina pectoris: Secondary | ICD-10-CM | POA: Insufficient documentation

## 2020-09-15 DIAGNOSIS — Z87891 Personal history of nicotine dependence: Secondary | ICD-10-CM

## 2020-09-23 MED ORDER — NIFEDIPINE ER OSMOTIC RELEASE 60 MG PO TB24: 120.0000 mg | ORAL_TABLET | Freq: Every day | ORAL | 3 refills | Status: DC

## 2020-10-14 ENCOUNTER — Other Ambulatory Visit: Payer: Self-pay | Admitting: Pulmonary Disease

## 2020-10-14 DIAGNOSIS — F17200 Nicotine dependence, unspecified, uncomplicated: Secondary | ICD-10-CM

## 2020-10-14 NOTE — Telephone Encounter (Signed)
Dr. Byrum, please advise if you are okay refilling med. 

## 2020-10-25 ENCOUNTER — Other Ambulatory Visit: Payer: Self-pay

## 2020-11-09 ENCOUNTER — Other Ambulatory Visit: Payer: Self-pay | Admitting: Adult Health

## 2020-11-09 DIAGNOSIS — F5101 Primary insomnia: Secondary | ICD-10-CM

## 2020-12-07 ENCOUNTER — Ambulatory Visit (INDEPENDENT_AMBULATORY_CARE_PROVIDER_SITE_OTHER): Payer: Medicare Other | Admitting: Adult Health

## 2020-12-07 ENCOUNTER — Encounter: Payer: Self-pay | Admitting: Adult Health

## 2020-12-07 ENCOUNTER — Other Ambulatory Visit: Payer: Self-pay

## 2020-12-07 VITALS — BP 110/64 | HR 64 | Temp 97.3°F | Wt 169.0 lb

## 2020-12-07 DIAGNOSIS — F330 Major depressive disorder, recurrent, mild: Secondary | ICD-10-CM

## 2020-12-07 DIAGNOSIS — R002 Palpitations: Secondary | ICD-10-CM | POA: Diagnosis not present

## 2020-12-07 DIAGNOSIS — F419 Anxiety disorder, unspecified: Secondary | ICD-10-CM

## 2020-12-07 MED ORDER — BUPROPION HCL ER (XL) 150 MG PO TB24: 150.0000 mg | ORAL_TABLET | ORAL | 0 refills | Status: DC

## 2020-12-07 NOTE — Progress Notes (Signed)
Assessment and Plan:  Willean was seen today for palpitations.  Diagnoses and all orders for this visit:  Palpitations New intermittent brief, <5 sec in last few weeks Normal EKG, no cardiac accompaniments Denies other sx other than depression/anxiety and notably off of daily agent x 2 months Had normal CBC, CMP, TSH in 08/2020, no med changes, denies sx suggestive of related etiology However OSA non-compliant with CPAP, could be a. Fib discussed recommendation to reduce alcohol intake  Discussed at length today; she prefers to defer lab recheck, has close upcoming appointment for routine labs, can get then Suspect most likely r/t mood - restart med, see below plan Monitor closely, if any worse, lasting longer periods 30+ sec or any accompaniments will refer for Holter Go to the ER if any chest pain, shortness of breath, nausea, dizziness, severe HA, changes vision/speech -     EKG 12-Lead- sinus bradycardia, NSCPT  Recurrent mild major depressive disorder with anxiety ([place]) Restart wellbutrin as prescribed Stress management techniques discussed, increase water, good sleep hygiene discussed, increase exercise, and increase veggies.  Discussed CBT - receptive, will reach out through insurance Try to add walking 3 days a week, try to meet up with friend for social outing Reduce alcohol intake, avoid alcohol <4 hours from bedtime  Follow up 1 month, call the office if any new AE's from medications and we will switch them -     buPROPion (WELLBUTRIN XL) 150 MG 24 hr tablet; Take 1 tablet (150 mg total) by mouth every morning.  Further disposition pending results of labs. Discussed med's effects and SE's.   Over 30 minutes of exam, counseling, chart review, and critical decision making was performed.   Future Appointments  Date Time Provider [place]  12/19/2020 10:30 AM Liane Comber, NP [facility] None  04/24/2021 11:00 AM Unk Pinto, MD [facility] None     ------------------------------------------------------------------------------------------------------------------   HPI BP 110/64   Pulse 64   Temp (!) 97.3 F (36.3 C)   Wt 169 lb (76.7 kg)   SpO2 (!) 86%   BMI 28.12 kg/m   70 y.o.female smoker (down to 1 cigarette in AM with coffee) with htn, OSA (CPAP mask intolerance and not treated), anxiety presents for evaluation of palpitations.   She reports onset has been gradual, will have just a few quick <5 sec of quick heart beats, intermittently but typically when sitting, never when active. Doesn't correlate with exertion, with smoking or with caffeine intake. Denies CP, dizziness, dyspnea, edema, fatigue.   She does have anxiety, ongoing, doesn't feel this is notably worse than usual.  Was taking Wellbutrin XL 300 mg, admits stopped this 2 months ago as was doing well, notes after discussion that mood seems worse since stopping, very poor motivation to do anything, takes klonopin 0.25-0.5 mg only twice a month or so. Does sleep well other than unusual nightmare last week.   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  Has been very unsocial since pandemic, no other family. "They're all dead." She has not done any counseling/therapy but has considered and receptive.   She drinks 2 glasses of wine every evening - unchanged, ongoing for many years. Denies drug use.  She denies SI/HI.    She had WNL CBC, CMP, TSH 09/02/2020.   Past Medical History:  Diagnosis Date  . Allergy    enviromental  . Anxiety   . Arthritis    osteoarthritis  . COPD (chronic obstructive  pulmonary disease) ([place])    mild  . Diabetes mellitus without complication ([place])    controlled by diet  . Environmental allergies   . GERD (gastroesophageal reflux disease)   . Hepatitis    hepatitis A  . History of colon polyps   . Hyperlipidemia   . Hypertension   . IBS (irritable bowel syndrome)   . Mixed  hyperlipidemia 06/15/2013  . PONV (postoperative nausea and vomiting)    second surgery no problems last surgery  . Solitary pulmonary nodule 06/02/2019  . Urgency of urination    Takes oxybutynin; under control at this time     Allergies  Allergen Reactions  . Enalapril Swelling    Caused lips to swell and bloating.  . Statins     Tingling with several statins  . Repatha [Evolocumab]     Stated she didn't tolerate it and switched to Praluent    Allergies:  Allergies  Allergen Reactions  . Enalapril Swelling    Caused lips to swell and bloating.  . Statins     Tingling with several statins  . Repatha [Evolocumab]     Stated she didn't tolerate it and switched to Praluent   Social History:   reports that she has been smoking cigarettes. She started smoking about 37 years ago. She has a 35.00 pack-year smoking history. She has never used smokeless tobacco. She reports current alcohol use. She reports current drug use. Drug: Marijuana.   Current Outpatient Medications on File Prior to Visit  Medication Sig  . Alirocumab (PRALUENT) 150 MG/ML SOAJ Inject 150 mg into the skin every 14 (fourteen) days.  . Ascorbic Acid (VITAMIN C) 1000 MG tablet Take 1,000 mg by mouth daily.  Marland Kitchen aspirin 81 MG tablet Take 81 mg by mouth every other day.   . bisoprolol-hydrochlorothiazide (ZIAC) 10-6.25 MG tablet TAKE 1/2 TABLET BY MOUTH DAILY  . buPROPion (WELLBUTRIN XL) 300 MG 24 hr tablet Take 1 tablet (300 mg total) by mouth daily.  . Cholecalciferol (VITAMIN D3) 5000 UNITS TABS Take 5,000 Units by mouth daily.  . clonazePAM (KLONOPIN) 0.5 MG tablet Take 1/2 - 1 tablet 1 to 2  x /day ONLY if needed for Anxiety Attack &  limit to 5 days /week to avoid Addiction & Dementia  . clopidogrel (PLAVIX) 75 MG tablet Take  1 tablet  Daily  to Prevent Blood Clots  . Cyanocobalamin (VITAMIN B12 PO) Take 1,000 mcg by mouth daily.  . fluticasone (FLONASE) 50 MCG/ACT nasal spray 1 Spray each nostril daily  .  losartan (COZAAR) 100 MG tablet TAKE 1 TABLET BY MOUTH DAILY FOR BLOOD PRESSURE  . methylcellulose oral powder Take by mouth daily.   . Multiple Vitamins-Minerals (MULTI COMPLETE PO) Take 1 capsule by mouth daily.  Marland Kitchen NIFEdipine (PROCARDIA XL/NIFEDICAL XL) 60 MG 24 hr tablet Take 2 tablets (120 mg total) by mouth daily.  . Omega-3 Fatty Acids (FISH OIL) 1000 MG CAPS Take 1 capsule (1,000 mg total) by mouth 2 (two) times daily with a meal.  . omeprazole (PRILOSEC) 40 MG capsule Take  1 capsule 2 x /day  to Prevent Indigestion & Heartburn  . Tiotropium Bromide Monohydrate (SPIRIVA RESPIMAT IN) Inhale 1 puff into the lungs 2 (two) times daily.  . Zinc 50 MG TABS Take by mouth daily.  . montelukast (SINGULAIR) 10 MG tablet TAKE 1 TABLET(10 MG) BY MOUTH DAILY (Patient not taking: Reported on 12/07/2020)   No current facility-administered medications on file prior to visit.  ROS: Review of Systems  Constitutional: Negative for chills, diaphoresis, fever, malaise/fatigue and weight loss.  HENT: Negative for hearing loss and tinnitus.   Eyes: Negative for blurred vision and double vision.  Respiratory: Negative for cough, sputum production and shortness of breath.   Cardiovascular: Positive for palpitations (brief, intermittent). Negative for chest pain, orthopnea, claudication, leg swelling and PND.  Gastrointestinal: Negative for abdominal pain, blood in stool, diarrhea, heartburn, melena, nausea and vomiting.  Genitourinary: Negative.   Musculoskeletal: Negative.   Skin: Negative for rash.  Neurological: Negative for dizziness, sensory change, weakness and headaches.  Endo/Heme/Allergies: Does not bruise/bleed easily.  Psychiatric/Behavioral: Positive for depression. Negative for memory loss, substance abuse (alcohol intake unchanged) and suicidal ideas. The patient is nervous/anxious (occasional, stable). The patient does not have insomnia.     Physical Exam:  BP 110/64   Pulse 64   Temp  (!) 97.3 F (36.3 C)   Wt 169 lb (76.7 kg)   SpO2 (!) 86%   BMI 28.12 kg/m   General Appearance: Well nourished, in no apparent distress. Eyes: PERRLA, conjunctiva no swelling or erythema ENT/Mouth: Ext aud canals clear, TMs without erythema, bulging. No erythema, swelling, or exudate on post pharynx.  Tonsils not swollen or erythematous. Hearing normal.  Neck: Supple, thyroid normal.  Respiratory: Respiratory effort normal, BS equal bilaterally without rales, rhonchi, wheezing or stridor.  Cardio: RRR with no MRGs. Brisk peripheral pulses without edema.  Abdomen: Soft, + BS.  Non tender Lymphatics: Non tender without lymphadenopathy.  Musculoskeletal: no obvious deformity, normal gait.  Skin: Warm, dry without rashes, lesions, ecchymosis.  Neuro: Cranial nerves intact. Normal muscle tone, no cerebellar symptoms. Sensation intact.  Psych: Awake and oriented X 3, tearful affect, Insight and Judgment appropriate.    Depression screen [facility] 2/9 12/07/2020 09/02/2020 02/16/2020  Decreased Interest 3 0 0  Down, Depressed, Hopeless 1 0 0  PHQ - 2 Score 4 0 0  Altered sleeping 0 - -  Tired, decreased energy 3 - -  Change in appetite 0 - -  Feeling bad or failure about yourself  1 - -  Trouble concentrating 0 - -  Moving slowly or fidgety/restless 0 - -  Suicidal thoughts 0 - -  PHQ-9 Score 8 - -  Difficult doing work/chores Somewhat difficult - -     Izora Ribas, NP 10:56 AM [facility]

## 2020-12-07 NOTE — Patient Instructions (Addendum)
Can check with insurance about therapist for CBT -   Please restart wellbutrin 1 tab daily at 150 mg dose to begin with, can increase back to 300 mg/day if needed in 2-4 weeks  Monitor palpitations- let me know if getting worse or anything unusual    Cognitive Behavioral Therapy Cognitive behavioral therapy (CBT) is a short-term, goal-oriented type of talk therapy. CBT can help you:  Identify patterns of thinking, feeling, and behaving that are causing you problems.  Decide how you want to think, feel, and respond to life events.  Set goals to change the beliefs and thoughts that cause you to act in ways that are not helpful for you.  Follow up on the changes that you make. What are the different types of CBT? The different types of CBT include:  Dialectical behavioral therapy (DBT). This approach is often used in group therapy, and it aids a person in managing behavior by focusing on: ? Things that cause problems to start (triggers). ? Methods of self-calming. ? Re-evaluating thinking processes.  Mindfulness-based cognitive therapy. This approach involves focusing your attention, meditating, and developing awareness of the present moment (mindfulness).  Rational emotive behavior therapy. This approach uses rational thought to reframe your thinking so it is less judgmental. Your therapist may directly challenge your thought processes.  Stress inoculation training. This approach involves planning ahead for stressful situations by practicing new thoughts and behaviors. This planning can help you avoid going back to old actions.  Acceptance and commitment therapy (ACT). This approach focuses on accepting yourself as you are and practicing mindfulness. It helps you understand what you would like to change and how you can set goals in that direction.   What conditions is CBT used to treat? CBT may help to treat:  Mental health conditions,  including: ? Depression. ? Anxiety. ? Bipolar disorder. ? Eating disorders. ? Post-traumatic stress disorder (PTSD). ? Obsessive-compulsive disorder (OCD).  Insomnia and other sleep disorders.  Pain.  Stress.  Coping with loss or grief.  Coping with a difficult medical diagnosis or illness.  Relationship problems.  Emotional distress or shock (trauma). How can CBT help me? CBT may:  Give you a chance to share your thoughts, feelings, problems, and fears in a safe space.  Help you focus on specific problems.  Give you homework that helps you put theory into practice. Homework may include keeping a journal or doing thinking exercises.  Help you become aware of your patterns of thinking, feeling, and behaving, and how those three patterns affect each other.  Change your thoughts so that you can change your behaviors.  Help you chose how you want to view the world.  Teach you planned coping skills and offer better ways to deal with stress and difficult situations. To make the most of CBT, make sure you:  Find a licensed therapist whom you trust.  Take an active part in your therapy and do the homework that you are given.  Are honest about your problems.  Avoid skipping your therapy sessions.   Summary  Cognitive behavioral therapy (CBT) is a short-term, goal-oriented type of talk therapy.  CBT can help you become aware of your patterns and the relationships among your thoughts, feelings, and behavior.  CBT may help mental health conditions and other problems. This information is not intended to replace advice given to you by your health care provider. Make sure you discuss any questions you have with your health care provider. Document Revised: 12/24/2019 Document Reviewed:  12/24/2019 Elsevier Patient Education  2021 [employer].

## 2020-12-19 ENCOUNTER — Ambulatory Visit: Payer: Medicare Other | Admitting: Adult Health

## 2021-01-16 ENCOUNTER — Other Ambulatory Visit: Payer: Self-pay | Admitting: Adult Health

## 2021-01-16 DIAGNOSIS — F330 Major depressive disorder, recurrent, mild: Secondary | ICD-10-CM

## 2021-01-23 NOTE — Progress Notes (Signed)
Assessment and Plan:  Meztli was seen today for follow up visit .  Diagnoses and all orders for this visit:  Essential hypertension -     CBC with Differential/Platelet -     COMPLETE METABOLIC PANEL WITH GFR -   Continue Meds and diet, DASH diet reviewed - Check BP at home, if consistently >130/80 notify office  Hyperlipidemia, mixed -     COMPLETE METABOLIC PANEL WITH GFR - Continue medication and diet  Vitamin D deficiency  -     VITAMIN D 25 Hydroxy (Vit-D Deficiency, Fractures)  Medication management -     CBC with Differential/Platelet -     TSH -     Magnesium  Headaches       - Continue to monitor headaches, if they are becoming more intense or not going away with use of over the counter medications she is to call the office.  Continue Flonase and Singulair as could be sinus related due to allergies.   Trigger finger left hand -   Have patient wear finger brace to keep finger extended, rest finger, given handout with care instructions  Gastroesophageal reflux disease with esophagitis without hemorrhage -     CBC with Differential/Platelet - Refer to gastroenterology for upper GI/endoscopy       -  H. Pylori test performed  Further disposition pending results of labs. Discussed med's effects and SE's.   Over 40 minutes of exam, counseling, chart review, and critical decision making was performed.   Future Appointments  Date Time Provider [place]  01/31/2021  4:00 PM Liane Comber, NP [facility] None  04/24/2021 11:00 AM Unk Pinto, MD [facility] None    ------------------------------------------------------------------------------------------------------------------   HPI BP (!) 148/70   Pulse 71   Temp (!) 97.3 F (36.3 C)   Wt 163 lb (73.9 kg)   SpO2 96%   BMI 27.12 kg/m   70 y.o.female smoker (down to 1 cigarette in AM with coffee) with htn, OSA (CPAP mask intolerance and not treated), anxiety presents for evaluation of possible  arthritis in left hand , 3rd finger trigger finger x 2 weeks. Aching pain in left hand 5/10 on pain scale, has not tried relief measures.  Pain gets worse with Cold temperatures  Headaches after she gets pain in her throat and goes up left side of head as a mild ache. Occurs intermittently every couple days and lasts less than 30 minutes and resolves on its own BP Readings from Last 3 Encounters:  01/24/21 (!) 148/70  12/07/20 110/64  09/02/20 120/62    6 months ago had blood clot in eye and was seen by [facility] and clot resolved  She drinks 2 glasses of wine every evening - unchanged, ongoing for many years. Denies drug use.  She denies SI/HI.    She had WNL CBC, CMP, TSH 09/02/2020.   Past Medical History:  Diagnosis Date   Allergy    enviromental   Anxiety    Arthritis    osteoarthritis   COPD (chronic obstructive pulmonary disease) (HCC)    mild   Diabetes mellitus without complication ([place])    controlled by diet   Environmental allergies    GERD (gastroesophageal reflux disease)    Hepatitis    hepatitis A   History of colon polyps    Hyperlipidemia    Hypertension    IBS (irritable bowel syndrome)    Mixed hyperlipidemia 06/15/2013   PONV (postoperative nausea and vomiting)    second surgery  no problems last surgery   Solitary pulmonary nodule 06/02/2019   Urgency of urination    Takes oxybutynin; under control at this time     Allergies  Allergen Reactions   Enalapril Swelling    Caused lips to swell and bloating.   Statins     Tingling with several statins   Repatha [Evolocumab]     Stated she didn't tolerate it and switched to Praluent    Allergies:  Allergies  Allergen Reactions   Enalapril Swelling    Caused lips to swell and bloating.   Statins     Tingling with several statins   Repatha [Evolocumab]     Stated she didn't tolerate it and switched to Praluent   Social History:   reports that she has been smoking cigarettes. She started  smoking about 37 years ago. She has a 35.00 pack-year smoking history. She has never used smokeless tobacco. She reports current alcohol use. She reports current drug use. Drug: Marijuana.   Current Outpatient Medications on File Prior to Visit  Medication Sig   Alirocumab (PRALUENT) 150 MG/ML SOAJ Inject 150 mg into the skin every 14 (fourteen) days.   Ascorbic Acid (VITAMIN C) 1000 MG tablet Take 1,000 mg by mouth daily.   aspirin 81 MG tablet Take 81 mg by mouth every other day.    bisoprolol-hydrochlorothiazide (ZIAC) 10-6.25 MG tablet TAKE 1/2 TABLET BY MOUTH DAILY   buPROPion (WELLBUTRIN XL) 150 MG 24 hr tablet TAKE 1 TABLET(150 MG) BY MOUTH EVERY MORNING   Cholecalciferol (VITAMIN D3) 5000 UNITS TABS Take 5,000 Units by mouth daily.   clonazePAM (KLONOPIN) 0.5 MG tablet Take 1/2 - 1 tablet 1 to 2  x /day ONLY if needed for Anxiety Attack &  limit to 5 days /week to avoid Addiction & Dementia   clopidogrel (PLAVIX) 75 MG tablet Take  1 tablet  Daily  to Prevent Blood Clots   Cyanocobalamin (VITAMIN B12 PO) Take 1,000 mcg by mouth daily.   fluticasone (FLONASE) 50 MCG/ACT nasal spray 1 Spray each nostril daily   losartan (COZAAR) 100 MG tablet TAKE 1 TABLET BY MOUTH DAILY FOR BLOOD PRESSURE   methylcellulose oral powder Take by mouth daily.    montelukast (SINGULAIR) 10 MG tablet TAKE 1 TABLET(10 MG) BY MOUTH DAILY   Multiple Vitamins-Minerals (MULTI COMPLETE PO) Take 1 capsule by mouth daily.   NIFEdipine (PROCARDIA XL/NIFEDICAL XL) 60 MG 24 hr tablet Take 2 tablets (120 mg total) by mouth daily.   Omega-3 Fatty Acids (FISH OIL) 1000 MG CAPS Take 1 capsule (1,000 mg total) by mouth 2 (two) times daily with a meal.   omeprazole (PRILOSEC) 40 MG capsule Take  1 capsule 2 x /day  to Prevent Indigestion & Heartburn   Tiotropium Bromide Monohydrate (SPIRIVA RESPIMAT IN) Inhale 1 puff into the lungs 2 (two) times daily.   Zinc 50 MG TABS Take by mouth daily.   No current  facility-administered medications on file prior to visit.    ROS: Review of Systems  Constitutional:  Negative for chills, diaphoresis, fever, malaise/fatigue and weight loss.  HENT:  Positive for sore throat (with swallowing of rough foods) and tinnitus (has had long term intermittently). Negative for hearing loss.   Eyes:  Negative for blurred vision and double vision.  Respiratory:  Negative for cough, sputum production and shortness of breath.   Cardiovascular:  Negative for chest pain, palpitations (brief, intermittent), orthopnea, claudication, leg swelling and PND.  Gastrointestinal:  Positive for constipation (  controlled with Miralax and Magnesium) and heartburn (controlled with meds but feels like she tastes blood). Negative for abdominal pain, blood in stool, diarrhea, melena, nausea and vomiting.  Genitourinary: Negative.   Musculoskeletal:  Positive for joint pain (3rd finger left hand locks and is painful).  Skin:  Negative for rash.  Neurological:  Positive for headaches (pain is mild and goes from left jaw up head, only lasts <30 minutes and resolves spontaneously). Negative for dizziness, sensory change and weakness.  Endo/Heme/Allergies:  Does not bruise/bleed easily.  Psychiatric/Behavioral:  Negative for depression, memory loss, substance abuse (alcohol intake unchanged) and suicidal ideas. The patient is not nervous/anxious (occasional, stable) and does not have insomnia.    Physical Exam:  BP (!) 148/70   Pulse 71   Temp (!) 97.3 F (36.3 C)   Wt 163 lb (73.9 kg)   SpO2 96%   BMI 27.12 kg/m   General Appearance: Well nourished, in no apparent distress. Eyes: PERRLA, conjunctiva no swelling or erythema ENT/Mouth: Ext aud canals clear, TMs without erythema, bulging. No erythema, swelling, or exudate on post pharynx.  Tonsils not swollen or erythematous. Hearing normal.  Neck: Supple, thyroid normal.  Respiratory: Respiratory effort normal, BS equal bilaterally  without rales, rhonchi, wheezing or stridor.  Cardio: RRR with no MRGs. Brisk peripheral pulses without edema.  Abdomen: Soft, + BS.  Non tender Lymphatics: Non tender without lymphadenopathy.  Musculoskeletal: 3rd finger on right hand will lock in flexion, arthritic changes of hands noted. Skin: Warm, dry without rashes, lesions, ecchymosis.  Neuro: Cranial nerves intact. Normal muscle tone, no cerebellar symptoms. Sensation intact.  Psych: Awake and oriented X 3, tearful affect, Insight and Judgment appropriate.       Magda Bernheim, NP 11:48 AM [facility]

## 2021-01-24 ENCOUNTER — Ambulatory Visit (INDEPENDENT_AMBULATORY_CARE_PROVIDER_SITE_OTHER): Payer: Medicare Other | Admitting: Nurse Practitioner

## 2021-01-24 ENCOUNTER — Other Ambulatory Visit: Payer: Self-pay

## 2021-01-24 ENCOUNTER — Encounter: Payer: Self-pay | Admitting: Nurse Practitioner

## 2021-01-24 VITALS — BP 148/70 | HR 71 | Temp 97.3°F | Wt 163.0 lb

## 2021-01-24 DIAGNOSIS — R519 Headache, unspecified: Secondary | ICD-10-CM

## 2021-01-24 DIAGNOSIS — M65332 Trigger finger, left middle finger: Secondary | ICD-10-CM | POA: Diagnosis not present

## 2021-01-24 DIAGNOSIS — E782 Mixed hyperlipidemia: Secondary | ICD-10-CM | POA: Diagnosis not present

## 2021-01-24 DIAGNOSIS — Z79899 Other long term (current) drug therapy: Secondary | ICD-10-CM | POA: Diagnosis not present

## 2021-01-24 DIAGNOSIS — K2100 Gastro-esophageal reflux disease with esophagitis, without bleeding: Secondary | ICD-10-CM

## 2021-01-24 DIAGNOSIS — E559 Vitamin D deficiency, unspecified: Secondary | ICD-10-CM

## 2021-01-24 DIAGNOSIS — I10 Essential (primary) hypertension: Secondary | ICD-10-CM

## 2021-01-24 DIAGNOSIS — M79642 Pain in left hand: Secondary | ICD-10-CM | POA: Diagnosis not present

## 2021-01-24 NOTE — Patient Instructions (Signed)
Trigger Finger  Trigger finger, also called stenosing tenosynovitis,  is a condition that causes a finger to get stuck in a bent position. Each finger has a tendon, which is a tough, cord-like tissue that connects muscle tobone, and each tendon passes through a tunnel of tissue called a tendon sheath. To move your finger, your tendon needs to glide freely through the sheath. Trigger finger happens when the tendon or the sheath thickens, making it difficult to move your finger. Trigger finger can affect any finger or a thumb. It may affect more than one finger. Mild cases may clear up with rest andmedicine. Severe cases require more treatment. What are the causes? Trigger finger is caused by a thickened finger tendon or tendon sheath. Thecause of this thickening is not known. What increases the risk? The following factors may make you more likely to develop this condition: Doing activities that require a strong grip. Having rheumatoid arthritis, gout, or diabetes. Being 25-41 years old. Being female. What are the signs or symptoms? Symptoms of this condition include: Pain when bending or straightening your finger. Tenderness or swelling where your finger attaches to the palm of your hand. A lump in the palm of your hand or on the inside of your finger. Hearing a noise like a pop or a snap when you try to straighten your finger. Feeling a catching or locking sensation when you try to straighten your finger. Being unable to straighten your finger. How is this diagnosed? This condition is diagnosed based on your symptoms and a physical exam. How is this treated? This condition may be treated by: Resting your finger and avoiding activities that make symptoms worse. Wearing a finger splint to keep your finger extended. Taking NSAIDs, such as ibuprofen, to relieve pain and swelling. Doing gentle exercises to stretch the finger as told by your health care provider. Having medicine that reduces  swelling and inflammation (steroids) injected into the tendon sheath. Injections may need to be repeated. Having surgery to open the tendon sheath. This may be done if other treatments do not work and you cannot straighten your finger. You may need physical therapy after surgery. Follow these instructions at home: If you have a splint: Wear the splint as told by your health care provider. Remove it only as told by your health care provider. Loosen it if your fingers tingle, become numb, or turn cold and blue. Keep it clean. If the splint is not waterproof: Do not let it get wet. Cover it with a watertight covering when you take a bath or shower. Managing pain, stiffness, and swelling     If directed, apply heat to the affected area as often as told by your health care provider. Use the heat source that your health care provider recommends, such as a moist heat pack or a heating pad. Place a towel between your skin and the heat source. Leave the heat on for 20-30 minutes. Remove the heat if your skin turns bright red. This is especially important if you are unable to feel pain, heat, or cold. You may have a greater risk of getting burned. If directed, put ice on the painful area. To do this: If you have a removable splint, remove it as told by your health care provider. Put ice in a plastic bag. Place a towel between your skin and the bag or between your splint and the bag. Leave the ice on for 20 minutes, 2-3 times a day.  Activity Rest your finger as  told by your health care provider. Avoid activities that make the pain worse. Return to your normal activities as told by your health care provider. Ask your health care provider what activities are safe for you. Do exercises as told by your health care provider. Ask your health care provider when it is safe to drive if you have a splint on your hand. General instructions Take over-the-counter and prescription medicines only as told by  your health care provider. Keep all follow-up visits as told by your health care provider. This is important. Contact a health care provider if: Your symptoms are not improving with home care. Summary Trigger finger, also called stenosing tenosynovitis, causes your finger to get stuck in a bent position. This can make it difficult and painful to straighten your finger. This condition develops when a finger tendon or tendon sheath thickens. Treatment may include resting your finger, wearing a splint, and taking medicines. In severe cases, surgery to open the tendon sheath may be needed. This information is not intended to replace advice given to you by your health care provider. Make sure you discuss any questions you have with your healthcare provider. Document Revised: 11/17/2018 Document Reviewed: 11/17/2018 Elsevier Patient Education  [place].

## 2021-01-25 LAB — COMPLETE METABOLIC PANEL WITH GFR
AG Ratio: 1.8 (calc) (ref 1.0–2.5)
ALT: 12 U/L (ref 6–29)
AST: 16 U/L (ref 10–35)
Albumin: 4.4 g/dL (ref 3.6–5.1)
Alkaline phosphatase (APISO): 66 U/L (ref 37–153)
BUN: 18 mg/dL (ref 7–25)
CO2: 29 mmol/L (ref 20–32)
Calcium: 9.8 mg/dL (ref 8.6–10.4)
Chloride: 103 mmol/L (ref 98–110)
Creat: 0.82 mg/dL (ref 0.60–1.00)
Globulin: 2.5 g/dL (calc) (ref 1.9–3.7)
Glucose, Bld: 99 mg/dL (ref 65–99)
Potassium: 4.4 mmol/L (ref 3.5–5.3)
Sodium: 140 mmol/L (ref 135–146)
Total Bilirubin: 0.4 mg/dL (ref 0.2–1.2)
Total Protein: 6.9 g/dL (ref 6.1–8.1)
eGFR: 77 mL/min/{1.73_m2} (ref >=60)

## 2021-01-25 LAB — CBC WITH DIFFERENTIAL/PLATELET
Absolute Monocytes: 412 cells/uL (ref 200–950)
Basophils Absolute: 29 cells/uL (ref 0–200)
Basophils Relative: 0.5 %
Eosinophils Absolute: 52 cells/uL (ref 15–500)
Eosinophils Relative: 0.9 %
HCT: 42.2 % (ref 35.0–45.0)
Hemoglobin: 13.9 g/dL (ref 11.7–15.5)
Lymphs Abs: 2239 cells/uL (ref 850–3900)
MCH: 26.7 pg — ABNORMAL LOW (ref 27.0–33.0)
MCHC: 32.9 g/dL (ref 32.0–36.0)
MCV: 81 fL (ref 80.0–100.0)
MPV: 10.6 fL (ref 7.5–12.5)
Monocytes Relative: 7.1 %
Neutro Abs: 3068 cells/uL (ref 1500–7800)
Neutrophils Relative %: 52.9 %
Platelets: 292 10*3/uL (ref 140–400)
RBC: 5.21 10*6/uL — ABNORMAL HIGH (ref 3.80–5.10)
RDW: 14 % (ref 11.0–15.0)
Total Lymphocyte: 38.6 %
WBC: 5.8 10*3/uL (ref 3.8–10.8)

## 2021-01-25 LAB — VITAMIN D 25 HYDROXY (VIT D DEFICIENCY, FRACTURES): Vit D, 25-Hydroxy: 61 ng/mL (ref 30–100)

## 2021-01-25 LAB — TSH: TSH: 0.63 mIU/L (ref 0.40–4.50)

## 2021-01-25 LAB — MAGNESIUM: Magnesium: 1.9 mg/dL (ref 1.5–2.5)

## 2021-01-25 LAB — H. PYLORI BREATH TEST: H. pylori Breath Test: NOT DETECTED

## 2021-01-27 ENCOUNTER — Other Ambulatory Visit: Payer: Self-pay | Admitting: Adult Health

## 2021-01-31 ENCOUNTER — Ambulatory Visit: Payer: Medicare Other | Admitting: Adult Health

## 2021-02-23 ENCOUNTER — Encounter: Payer: Medicare Other | Admitting: Internal Medicine

## 2021-03-01 ENCOUNTER — Other Ambulatory Visit: Payer: Self-pay

## 2021-03-01 MED ORDER — PRALUENT 150 MG/ML ~~LOC~~ SOAJ: 150.0000 mg | SUBCUTANEOUS | 11 refills | Status: DC

## 2021-03-06 ENCOUNTER — Telehealth: Payer: Self-pay

## 2021-03-06 ENCOUNTER — Encounter: Payer: Self-pay | Admitting: Nurse Practitioner

## 2021-03-06 ENCOUNTER — Ambulatory Visit: Payer: Medicare Other | Admitting: Nurse Practitioner

## 2021-03-06 VITALS — BP 140/72 | HR 65 | Ht 65.0 in | Wt 162.0 lb

## 2021-03-06 DIAGNOSIS — K219 Gastro-esophageal reflux disease without esophagitis: Secondary | ICD-10-CM | POA: Diagnosis not present

## 2021-03-06 DIAGNOSIS — Z86010 Personal history of colonic polyps: Secondary | ICD-10-CM

## 2021-03-06 DIAGNOSIS — R0789 Other chest pain: Secondary | ICD-10-CM | POA: Diagnosis not present

## 2021-03-06 MED ORDER — PLENVU 140 G PO SOLR
ORAL | 0 refills | Status: DC
Start: 2021-03-06 — End: 2022-05-06

## 2021-03-06 NOTE — Patient Instructions (Addendum)
Schedule an appointment with your Cardiologist for clearance before your upcoming procedure.   PROCEDURES: You have been scheduled for a colonoscopy. Please follow the written instructions given to you at your visit today. Please pick up your prep supplies at the pharmacy within the next 1-3 days. If you use inhalers (even only as needed), please bring them with you on the day of your procedure.  You will be contacted by our office prior to your procedure for directions on holding your Plavix.  If you do not hear from our office 1 week prior to your scheduled procedure, please call [phone] to discuss.   IMAGING: You will be contacted by [facility] (Your caller ID will indicate phone # [phone]) in the next 2 days to schedule your Barium Swallow Test. If you have not heard from them within 2 business days, please call [facility] at [phone] to follow up on the status of your appointment.    It was great seeing you today! Thank you for entrusting me with your care and choosing [facility].  Noralyn Pick, CRNP  The [facility] providers would like to encourage you to use [facility] to communicate with providers for non-urgent requests or questions.  Due to long hold times on the telephone, sending your provider a message by [facility] may be faster and more efficient way to get a response. Please allow 48 business hours for a response.  Please remember that this is for non-urgent requests/questions.  If you are age 46 or older, your body mass index should be between 23-30. Your Body mass index is 26.96 kg/m. If this is out of the aforementioned range listed, please consider follow up with your Primary Care Provider.  If you are age 92 or younger, your body mass index should be between 19-25. Your Body mass index is 26.96 kg/m. If this is out of the aformentioned range listed, please consider follow up with your Primary Care  Provider.

## 2021-03-06 NOTE — Progress Notes (Signed)
RADIOLOGY SCHEDULING REQUEST FOR Barium Swallow [facility] Scheduling via secure staff message.

## 2021-03-06 NOTE — Telephone Encounter (Signed)
Request for surgical clearance:     Endoscopy Procedure  What type of surgery is being performed?     Colonoscopy  When is this surgery scheduled?     04/12/21  What type of clearance is required ?   Pharmacy  Are there any medications that need to be held prior to surgery and how long? Plavix 2 day hold. Patient was started on this due to blocked eye vessel  Practice name and name of physician performing surgery?      [facility]  What is your office phone and fax number?      Phone- [phone]  [phone]  Anesthesia type (None, local, MAC, general) ?       MAC

## 2021-03-06 NOTE — Progress Notes (Signed)
03/06/2021 [name] [id number] [birth year]   CHIEF COMPLAINT:  lip swelling, esophageal tightening   HISTORY OF PRESENT ILLNESS: [name] is a 70 year old female with a past medial history of anxiety, arthritis, hypertension, COPD, small right pulmonary nodule, coronary artery calcification per chest CT, left retinal artery branch occlusion on Plavix, DM II, GERD and colon polyps.   She presents to our office today as referred by Sherri Lamas NP for further GERD evaluation.   She was last seen in our office by Dr. Loletha Solomon on 04/20/2021 due to having reflux symptoms with abdominal bloat. She underwent an EGD 05/06/2019 which showed mild reflux which improved after she was started on Omeprazole '40mg'$  QD. She complains of having bad breath for 10+ years. She developed sour tasting burps about 6 months ago and her PCP increased her Omeprazole from '40mg'$  QD to '40mg'$  po bid. She denies having any further acid reflux. No dysphagia.  She complains of having mid esophageal tightening discomfort which comes and goes for the past few months and is unrelated to eating or activity.  When she experiences esophageal/chest tightening she takes off her bra and the tightening sensation abates.  No associated palpitations, dizziness or shortness of breath. She complains of having intermittent random mild swelling to her lips for the past few years which has been addressed previously by her PCP. She stated a few of her medications, ie: BP meds were changed without any noticeable improvement. Her lip swelling occurs randomly, lasts for a few days then goes away for a few weeks at a time. No obvious food or stress triggers. She denies ever seeing an allergist.  She is a chronic smoker.  She quit smoking cigarettes for about 1 year then restarted smoking in June or July 2022 she attributed to having increased stress after her son moved in with her.  She has a intermittent cough consisting of yellow tinted mucus.   No hemoptysis.  Her most recent surveillance chest CT was 08/24/2020 which showed a stable tiny right pulmonary nodule and evidence of three-vessel coronary atherosclerosis. Echo 5/21 showed normal LV function, mild LVH, grade 1 diastolic dysfunction, mild AI. Carotid Dopplers 6/21 showed 1-39% bilateral stenosis. Stress echocardiogram October 2018 showed no stress-induced wall motion abnormalities.  She last saw her cardiologist [name] on 08/25/2020 via a virtual visit.  At that time, she was asymptomatic and no further cardiac testing was recommended.  She was advised to continue ASA 81 mg daily and Praluent.  She is on Plavix due to having a left retinal artery branch occlusion.   She is passing normal formed brown bowel movement most days.  No rectal bleeding or black stools. She has a history of colon polyps. Significant family history of colon cancer. Sister diagnosed with colon cancer at the age of 42 and mother diagnosed with colon cancer at the age of 58.  She underwent a colonoscopy by [name] 06/12/2010 which identified 2 polyps in the cecum, a 3 mm polyp in the ascending colon, a 7 mm polyp in the descending colon and multiple hyperplastic polyps in the sigmoid colon were removed.  Pathology report was consistent with tubular adenomatous and hyperplastic polyps.  Her most recent colonoscopy was 02/08/2014 which showed a 6 mm polyp in the distal descending colon, biopsies were consistent with inflammatory polyp and an ulcer was identified at the splenic flexure without evidence of IBD or malignancy.  CBC Latest Ref Rng & Units 01/24/2021 09/02/2020  05/24/2020  WBC 3.8 - 10.8 Thousand/uL 5.8 5.4 7.5  Hemoglobin 11.7 - 15.5 g/dL 13.9 13.0 12.6  Hematocrit 35.0 - 45.0 % 42.2 39.4 38.1  Platelets 140 - 400 Thousand/uL 292 274 302     CMP Latest Ref Rng & Units 01/24/2021 09/02/2020 05/24/2020  Glucose 65 - 99 mg/dL 99 99 93  BUN 7 - 25 mg/dL '18 19 15  '$ Creatinine 0.60 - 1.00 mg/dL 0.82 0.79 0.82   Sodium 135 - 146 mmol/L 140 143 140  Potassium 3.5 - 5.3 mmol/L 4.4 4.3 4.0  Chloride 98 - 110 mmol/L 103 106 103  CO2 20 - 32 mmol/L '29 28 29  '$ Calcium 8.6 - 10.4 mg/dL 9.8 9.4 9.7  Total Protein 6.1 - 8.1 g/dL 6.9 6.6 6.9  Total Bilirubin 0.2 - 1.2 mg/dL 0.4 0.5 0.7  Alkaline Phos 33 - 130 U/L - - -  AST 10 - 35 U/L '16 16 17  '$ ALT 6 - 29 U/L '12 11 12    '$ EGD 05/06/2019 by Dr. Loletha Solomon: - Salmon-colored mucosa. Biopsied. - Normal stomach. - Normal examined duodenum. Biopsy result: - GASTROESOPHAGEAL MUCOSA WITH MILD INFLAMMATION CONSISTENT WITH REFLUX. - NO INTESTINAL METAPLASIA, DYSPLASIA OR MALIGNANCY.  Colonoscopy 02/08/2014 by [name]: 1. Ulcer at the splenic flexure 2. Sessile polyp measuring 6 mm in size was found in the distal descending colon; polypectomy was performed using snare cautery 3. Internal hemorrhoids 4. The colon was otherwise normal Biopsy result: 1. Surgical [P], colon, biopsy - FOCAL EROSION. SEE MICROSCOPIC DESCRIPTION. 2. Surgical [P], descending, polyp - BENIGN LYMPHOID POLYP. NO ADENOMATOUS CHANGE OR MALIGNANCY.  Colonoscopy 06/12/2010 by [name]: 2 polyps in the cecum, a 3 mm polyp in the ascending colon, a 7 mm polyp in the descending colon and multiple hyperplastic polyps in the sigmoid colon.1. Colon, polyp(s), cecum, ascending, descending :   - TUBULAR ADENOMA(S) (3 FRAGMENTS).   - HYPERPLASTIC POLYP.   - HIGH GRADE DYSPLASIA IS NOT IDENTIFIED.    Past Medical History:  Diagnosis Date   Allergy    enviromental   Anxiety    Arthritis    osteoarthritis   COPD (chronic obstructive pulmonary disease) (HCC)    mild   Diabetes mellitus without complication ([place])    controlled by diet   Environmental allergies    GERD (gastroesophageal reflux disease)    Hepatitis    hepatitis A   History of colon polyps    Hyperlipidemia    Hypertension    IBS (irritable bowel syndrome)    Mixed hyperlipidemia 06/15/2013   PONV (postoperative nausea  and vomiting)    second surgery no problems last surgery   Solitary pulmonary nodule 06/02/2019   Urgency of urination    Takes oxybutynin; under control at this time   Past Surgical History:  Procedure Laterality Date   APPENDECTOMY     removed as child   BACK SURGERY     lumbar fusion   CATARACT EXTRACTION Left 2019   COLONOSCOPY     DILATION AND CURETTAGE OF UTERUS     LUMBAR FUSION  02/06/2012   L 4  L5   PARTIAL HYSTERECTOMY     POLYPECTOMY     TOTAL HIP ARTHROPLASTY  06/02/2012   Procedure: TOTAL HIP ARTHROPLASTY;  Surgeon: Kerin Salen, MD;  Location: [facility];  Service: Orthopedics;  Laterality: Right;   TOTAL HIP ARTHROPLASTY Left 01/25/2016   TOTAL HIP ARTHROPLASTY Left 01/25/2016   Procedure: LEFT TOTAL HIP ARTHROPLASTY;  Surgeon:  Frederik Pear, MD;  Location: [facility];  Service: Orthopedics;  Laterality: Left;   TOTAL KNEE ARTHROPLASTY Right 12/15/2012   Procedure: TOTAL KNEE ARTHROPLASTY;  Surgeon: Kerin Salen, MD;  Location: [facility];  Service: Orthopedics;  Laterality: Right;   TOTAL KNEE ARTHROPLASTY Left 06/18/2016   Procedure: TOTAL KNEE ARTHROPLASTY;  Surgeon: Frederik Pear, MD;  Location: [facility] [facility];  Service: Orthopedics;  Laterality: Left;   UPPER GASTROINTESTINAL ENDOSCOPY  05/06/2019   Allergies  Allergen Reactions   Enalapril Swelling    Caused lips to swell and bloating.   Statins     Tingling with several statins   Repatha [Evolocumab]     Stated she didn't tolerate it and switched to Praluent     Outpatient Encounter Medications as of 03/06/2021  Medication Sig   Alirocumab (PRALUENT) 150 MG/ML SOAJ Inject 150 mg into the skin every 14 (fourteen) days.   Ascorbic Acid (VITAMIN C) 1000 MG tablet Take 1,000 mg by mouth daily.   aspirin 81 MG tablet Take 81 mg by mouth every other day.    bisoprolol-hydrochlorothiazide (ZIAC) 10-6.25 MG tablet TAKE 1/2 TABLET BY MOUTH DAILY   buPROPion (WELLBUTRIN XL) 150 MG 24 hr tablet TAKE 1 TABLET(150 MG) BY MOUTH EVERY MORNING    Cholecalciferol (VITAMIN D3) 5000 UNITS TABS Take 5,000 Units by mouth daily.   clonazePAM (KLONOPIN) 0.5 MG tablet Take 1/2 - 1 tablet 1 to 2  x /day ONLY if needed for Anxiety Attack &  limit to 5 days /week to avoid Addiction & Dementia   clopidogrel (PLAVIX) 75 MG tablet Take  1 tablet  Daily  to Prevent Blood Clots   Cyanocobalamin (VITAMIN B12 PO) Take 1,000 mcg by mouth daily.   fluticasone (FLONASE) 50 MCG/ACT nasal spray 1 Spray each nostril daily   losartan (COZAAR) 100 MG tablet TAKE 1 TABLET BY MOUTH DAILY FOR BLOOD PRESSURE   methylcellulose oral powder Take by mouth daily.    montelukast (SINGULAIR) 10 MG tablet TAKE 1 TABLET(10 MG) BY MOUTH DAILY   Multiple Vitamins-Minerals (MULTI COMPLETE PO) Take 1 capsule by mouth daily.   NIFEdipine (PROCARDIA XL/NIFEDICAL XL) 60 MG 24 hr tablet Take 2 tablets (120 mg total) by mouth daily.   Omega-3 Fatty Acids (FISH OIL) 1000 MG CAPS Take 1 capsule (1,000 mg total) by mouth 2 (two) times daily with a meal.   omeprazole (PRILOSEC) 40 MG capsule Take  1 capsule 2 x /day  to Prevent Indigestion & Heartburn   Tiotropium Bromide Monohydrate (SPIRIVA RESPIMAT IN) Inhale 1 puff into the lungs 2 (two) times daily.   Zinc 50 MG TABS Take by mouth daily.   No facility-administered encounter medications on file as of 03/06/2021.    REVIEW OF SYSTEMS:  Gen: Denies fever, sweats or chills. No weight loss.  CV: See HPI.   Resp: Denies cough, shortness of breath of hemoptysis.  GI: See HPI.   GU : Denies urinary burning, blood in urine, increased urinary frequency or incontinence. MS: Denies joint pain, muscles aches or weakness. Derm: Denies rash, itchiness, skin lesions or unhealing ulcers. Psych: Denies depression, anxiety, memory loss, suicidal ideation and confusion. Heme: Denies bruising, bleeding. Neuro:  Denies headaches, dizziness or paresthesias. Endo:  Denies any problems with DM, thyroid or adrenal function.  PHYSICAL EXAM: BP  140/72   Pulse 65   Ht '5\' 5"'$  (1.651 m)   Wt 162 lb (73.5 kg)   BMI 26.96 kg/m   General: 70 year old female  in no acute distress. Head: Normocephalic and atraumatic. Eyes:  Sclerae non-icteric, conjunctive pink. Ears: Normal auditory acuity. Mouth: Dentition intact. No ulcers or lesions.  Neck: Supple, no lymphadenopathy or thyromegaly.  Lungs: Clear bilaterally to auscultation without wheezes, crackles or rhonchi. Heart: Regular rate and rhythm. No murmur, rub or gallop appreciated.  Abdomen: Soft, nontender, non distended. No masses. No hepatosplenomegaly. Normoactive bowel sounds x 4 quadrants.  Rectal: Deferred. Musculoskeletal: Symmetrical with no gross deformities. Skin: Warm and dry. No rash or lesions on visible extremities. Extremities: No edema. Neurological: Alert oriented x 4, no focal deficits.  Psychological:  Alert and cooperative. Normal mood and affect.  ASSESSMENT AND PLAN:  43. 70 year old female with GERD, questionable esophageal tightening vs cardiac CP vs anxiety induced.  EGD 05/06/2019 showed mild reflux otherwise was normal. - Barium swallow with tablet rule out esophageal spasms/dysmotility/stricture  -Patient advised to follow-up with [name] to rule out cardiac etiology regarding esophageal versus chest tightness -Consider repeat EGD at time of colonoscopy, await results of barium swallow study -Continue Omeprazole 40 mg p.o. twice daily for now  2. Colon cancer screening. Significant family history of colon cancer (mother and sister). Colonoscopy in 2015 showed an inflammatory polyp.  -Colonoscopy benefits and risks discussed including risk with sedation, risk of bleeding, perforation and infection   3.  CAD per chest CT.  -Patient to follow-up with [name] prior to proceeding with a colonoscopy as noted above   4.  Tobacco use -Smoking cessation encouraged  5. Intermittent lip swelling, concerning for angioedema response  -Follow-up with  PCP, consider allergy consult  6.  Left retinal artery branch occlusion on Plavix -Our office will contact the patient's PCP/ophthalmologist verify Plavix instructions prior to proceeding with a colonoscopy  7. Mild COPD   CC:  Unk Pinto, MD

## 2021-03-08 NOTE — Telephone Encounter (Signed)
Left message for patient to return my call.

## 2021-03-08 NOTE — Telephone Encounter (Signed)
Returned call to patient to advise about holding her Plavix. She let me know that Dr. Melford Aase had already sent her a message to hold her Plavix and Asprin 5-7 days prior. She is to restart her Asprin after the procedure and will no longer need to take Plavix after she has stopped for the procedure.

## 2021-03-09 ENCOUNTER — Other Ambulatory Visit: Payer: Self-pay | Admitting: Internal Medicine

## 2021-03-09 ENCOUNTER — Telehealth: Payer: Self-pay | Admitting: Nurse Practitioner

## 2021-03-09 ENCOUNTER — Telehealth: Payer: Self-pay | Admitting: Cardiology

## 2021-03-09 DIAGNOSIS — R079 Chest pain, unspecified: Secondary | ICD-10-CM

## 2021-03-09 DIAGNOSIS — R1013 Epigastric pain: Secondary | ICD-10-CM

## 2021-03-09 NOTE — Telephone Encounter (Signed)
   [facility] Pre-operative Risk Assessment    Request for surgical clearance:  What type of surgery is being performed? Colonoscopy    When is this surgery scheduled?  TBD  What type of clearance is required (medical clearance vs. Pharmacy clearance to hold med vs. Both)?  Both   Are there any medications that need to be held prior to surgery and how long? Plavix, 5-7 days prior   Practice name and name of physician performing surgery?  [facility]  Carl Best, NP is requesting the clearance, but Ander Purpura is unsure who will be performing it  What is your office phone number? [phone]    7.   What is your office fax number? [phone]  8.   Anesthesia type (None, local, MAC, general) ?  Propofol    Zara Council 03/09/2021, 12:34 PM

## 2021-03-09 NOTE — Telephone Encounter (Signed)
Will fax to surgeon's office, will need to reach out to PCP in regards to clearance on Plavix.

## 2021-03-09 NOTE — Telephone Encounter (Signed)
[name], refer to office visit 03/06/2021. Patient was instructed to follow up with her cardiologist Dr. Stanford Breed prior to proceeding with a colonoscopy (and possible EGD) due to having chest vs esophageal tightness.   Looks like she did not initiate a follow up appointment or call to Dr. Stanford Breed regarding her chest tightness.  Please contact Dr. Jacalyn Lefevre office today and request an official cardiac clearance due to the patient having chest tightness which needs to be done within the next 2 weeks or her procedure date 04/12/2021 may need to be changed to a later date.   Note, we did receive Plavix clearance instructions from Dr. Stanford Breed which is great but that is not a cardiac clearance.   Keep me posted

## 2021-03-09 NOTE — Progress Notes (Signed)
____________________________________________________________  Attending physician addendum:  Thank you for sending this case to me. I have reviewed the entire note and plan.  The chest discomfort is atypical for GERD.  This patient needs to see Dr. Stanford Breed about the chest pain well in advance of the scheduled colonoscopy.  She does not currently have an appointment with Cardiology. If the colonoscopy needs to be moved to a later date to allow pre-procedure Cardiology evaluation, then please do so.  Allergist evaluation is also in order for the periodic lip swelling.  Wilfrid Lund, MD  ____________________________________________________________

## 2021-03-09 NOTE — Telephone Encounter (Signed)
Called Dr Lonia Skinner office at [phone] spoke with Enis Gash front desk. Requested medical cardic clearance for patients procedure. Will await call or fax back.

## 2021-03-09 NOTE — Telephone Encounter (Signed)
   Per chart review, this patient is on Plavix per PCP per Dr. Stanford Breed. I would have the procedural team reach to them for holding recommendations.

## 2021-03-10 NOTE — Telephone Encounter (Signed)
    Patient Name: [name]  DOB: January 30, 1951 MRN: [id number]  Primary Cardiologist: Dr. Stanford Breed  Chart reviewed as part of pre-operative protocol coverage. Given past medical history and time since last visit, based on ACC/AHA guidelines, Beckham A Hammack would be at acceptable risk for the planned procedure without further cardiovascular testing.   Plavix is prescribed per the patients PCP. Will need to reach to their team for holding recommendations.   The patient was advised that if she develops new symptoms prior to surgery to contact our office to arrange for a follow-up visit, and she verbalized understanding.  I will route this recommendation to the requesting party via Epic fax function and remove from pre-op pool.  Please call with questions.  Kathyrn Drown, NP 03/10/2021, 1:03 PM

## 2021-03-14 ENCOUNTER — Ambulatory Visit (HOSPITAL_COMMUNITY)
Admission: RE | Admit: 2021-03-14 | Discharge: 2021-03-14 | Disposition: A | Discharge status: Home / Self Care | Payer: Medicare Other | Source: Ambulatory Visit | Attending: Nurse Practitioner | Admitting: Nurse Practitioner

## 2021-03-14 ENCOUNTER — Other Ambulatory Visit: Payer: Self-pay

## 2021-03-14 DIAGNOSIS — R0789 Other chest pain: Secondary | ICD-10-CM | POA: Diagnosis not present

## 2021-03-14 DIAGNOSIS — K219 Gastro-esophageal reflux disease without esophagitis: Secondary | ICD-10-CM | POA: Diagnosis not present

## 2021-03-14 DIAGNOSIS — Z86010 Personal history of colonic polyps: Secondary | ICD-10-CM | POA: Diagnosis not present

## 2021-03-22 ENCOUNTER — Telehealth: Payer: Self-pay | Admitting: Nurse Practitioner

## 2021-03-22 NOTE — Telephone Encounter (Signed)
I agree, and her endoscopic procedures are not urgent.  Thank you for the diligent chart checking and care coordination.  - HD

## 2021-03-22 NOTE — Progress Notes (Signed)
See today's phone note. Patient to schedule colonoscopy +/- EGD after she sees's Dr. Stanford Breed in office for cardiac clearance.

## 2021-03-22 NOTE — Telephone Encounter (Signed)
[name], refer to office visit 03/06/2021. Patient was instructed to see her cardiologist [name] prior to pursuing an EGD and colonoscopy due to having chest vs esophageal tightness. Patient agreed to contact [name] but did not.   I called the patient on 03/09/2021 and reminded her she would need a cardiac evaluation/cardiac clearance prior to proceeding with a colonoscopy +/- EGD and per my request [name] office was also informed of request for a cardiac clearance on 03/09/2021.   In the interim, a cardiac clearance phone note was received from [facility] cardiology NP 03/10/2021 which documented the would be at acceptable risk for the planned procedure without further cardiovascular testing with recommendations to contact the patient's PCP regarding holding the Plavix as cardiology does not prescribe it. (Patient is on Plavix secondary to hx of a left retinal artery branch occlusion. Note her PCP [name] has provided Plavix instructions refer to phone note 8/24)  However, the patient was not physically seen in the cardiology office and [name] was not aware of the patient's report of chest vs esophageal tightness.  1) Please contact the patient and let her know she needs to make an appointment with [name] for a cardiac clearance prior to proceeding with a colonoscopy +/- an EGD  2) Cancel her 04/12/2021 colonoscopy with [name].   3) Colonoscopy +/- EGD to be rescheduled after cardiac clearance received   [name]

## 2021-03-23 ENCOUNTER — Telehealth: Payer: Self-pay | Admitting: Gastroenterology

## 2021-03-23 NOTE — Telephone Encounter (Signed)
Telephone  I agree, and her endoscopic procedures are not urgent.   Thank you for the diligent chart checking and care coordination.   - HD       Sherri Pick, NP routed this conversation to Me  [name], Sherri Corin, MD  [name], N:19 PM Note [name], refer to office visit 03/06/2021. Patient was instructed to see her cardiologist Dr. Stanford Solomon prior to pursuing an EGD and colonoscopy due to having chest vs esophageal tightness. Patient agreed to contact Dr. Stanford Solomon but did not.    I called the patient on 03/09/2021 and reminded her she would need a cardiac evaluation/cardiac clearance prior to proceeding with a colonoscopy +/- EGD and per my request [name] office was also informed of request for a cardiac clearance on 03/09/2021.    In the interim, a cardiac clearance phone note was received from [name] cardiology NP 03/10/2021 which documented the would be at acceptable risk for the planned procedure without further cardiovascular testing with recommendations to contact the patient's PCP regarding holding the Plavix as cardiology does not prescribe it. (Patient is on Plavix secondary to hx of a left retinal artery branch occlusion. Note her PCP Dr. Melford Solomon has provided Plavix instructions refer to phone note 8/24)   However, the patient was not physically seen in the cardiology office and [name] was not aware of the patient's report of chest vs esophageal tightness.   1) Please contact the patient and let her know she needs to make an appointment with Dr. Stanford Solomon for a cardiac clearance prior to proceeding with a colonoscopy +/- an EGD   2) Cancel her 04/12/2021 colonoscopy with Dr. Loletha Solomon.    3) Colonoscopy +/- EGD to be rescheduled after cardiac clearance received    Dr. Jonathon Solomon      Spoke with patient. Patient can not get in to see Dr [name] until first of the year per patient. I informed patient that she would to schedule appt with Dr  [name] as soon as she could so she can medical clearance for procedures. Patient verbalized understanding. Patients scheduled procedure with Dr [name] has been cancelled.         Additional Documentation  Encounter Info:  Billing Info, History, Allergies, Detailed Report    Proofreader Summary Orders Placed  None Medication Renewals and Changes   None   Medication List   Visit Diagnoses   None   Problem List

## 2021-03-23 NOTE — Telephone Encounter (Signed)
Hi Lauren, could you pls call this pt? She called stating that she was returning the office call. Thank you.

## 2021-03-23 NOTE — Telephone Encounter (Signed)
Lauren,  Thank you for letting me know.  I have copied this to her cardiology team to see what they can do about evaluating the patient in clinic sooner than the first of next year that the patient was reporting.  _________________________   Cardiology team,  Please see the attached message stream.  Thank you for your recent opinion on holding this patient's Plavix before endoscopic procedures.  The patient has been describing chest pain that I feel requires in person evaluation by her clinic prior to her undergoing endoscopic procedures.  We would greatly appreciate your help with that.  Thanks very much   - Wilfrid Lund, MD    [facility]

## 2021-03-24 NOTE — Telephone Encounter (Signed)
Noted  

## 2021-04-12 ENCOUNTER — Encounter: Payer: Medicare Other | Admitting: Gastroenterology

## 2021-04-23 ENCOUNTER — Encounter: Payer: Self-pay | Admitting: Internal Medicine

## 2021-04-23 NOTE — Progress Notes (Signed)
Annual Screening/Preventative Visit & Comprehensive Evaluation &  Examination  Future Appointments  Date Time Provider Central  04/24/2021 11:00 AM Unk Pinto, MD [facility] None  04/24/2022 11:00 AM Unk Pinto, MD [facility] None        This very nice 70 y.o. [name] presents for a Screening /Preventative Visit & comprehensive evaluation and management of multiple medical co-morbidities.  Patient has been followed for HTN, HLD, Prediabetes  and Vitamin D Deficiency. Abdominal CT scan in 2018 showed Aortic Atherosclerosis. Patient also has hx/o OSA on CPAP & COPD overlap followed by Dr Lamonte Sakai.  Patient also has GERD controlled on Omeprazole.         HTN predates circa 1988.   Patient has hx/o a retinal Aa occlusion & was started on Plavix ( as anti-platelet agent as per Up-to-date Recommendations ). Patient has had Cardiology evaluation in the past by Dr Stanford Breed & she has hx/o coronary artery calcification and also Aortic Atherosclerosis. Stress cardiac Echo was negative. Patient's BP has been controlled at home and patient denies any cardiac symptoms as chest pain, palpitations, shortness of breath, dizziness or ankle swelling. Today's BP is at goal -122/66.        Patient' was intolerant of Statins and also Repatha, but is tolerating Praulent controlling her Lipids. Patient denies myalgias or other medication SE's. Last lipids were at goal:  Lab Results  Component Value Date   CHOL 180 09/02/2020   HDL 82 09/02/2020   LDLCALC 76 09/02/2020   TRIG 137 09/02/2020   CHOLHDL 2.2 09/02/2020         Patient has hx/o prediabetes predating (A1c 5.7% /2017) and patient denies reactive hypoglycemic symptoms, visual blurring, diabetic polys or paresthesias. Last A1c was at goal:  Lab Results  Component Value Date   HGBA1C 5.4 09/02/2020         Finally, patient has history of Vitamin D Deficiency and last Vitamin D was at goal:  Lab Results   Component Value Date   VD25OH 61 01/24/2021     Current Outpatient Medications on File Prior to Visit  Medication Sig   PRALUENT  150 MG/ML SOAJ Inject 150 mg into the skin every 14 days.   VITAMIN C 1000 MG tablet Take 1   daily.   aspirin 81 MG tablet Take   every other day.    bisoprolol-hctz 10-6.25 MG tablet TAKE 1/2 TABLET DAILY   buPROPion-XL 150 MG 24 hr tablet TAKE 1 TABLET  EVERY MORNING   VITAMIN D 5000 UNITS  Take  daily.   clonazePAM 0.5 MG tablet Take 1/2 - 1 tablet 1 to 2  x /day ONLY if needed    clopidogrel 75 MG tablet Take  1 tablet  Daily  to Prevent Blood Clots   VITAMIN B12  1,000 mcg  Take daily.   FLONASE nasal spray 1 Spray each nostril daily   Losartan  100 MG tablet TAKE 1 TABLET DAILY FOR BLOOD PRESSURE   methylcellulose oral powder Take  daily.    montelukast 10 MG tablet TAKE 1 TABLET DAILY   Multiple Vitamins-Minerals  Take 1 capsule daily.   NIFEdipine-XL 60 MG  Take 2 tablets  daily.   Omega-3 FISH OIL 1000 MG CAPS Take 1 capsule  2  times daily with a meal.   omeprazole 40 MG capsule Take  1 capsule  2 x /day    PEG-KCl-NaCl-NaSulf-Na Asc-C (PLENVU) 140 g SOLR Use as directed for colonoscopy prep.[id number]  OVF:IEPP IRJJO:[id number] [id number]   SPIRIVA RESPIMAT  Inhale 1 puff  2  times daily.   Zinc 50 MG TABS Take    daily.      Allergies  Allergen Reactions   Enalapril Swelling    Caused lips to swell and bloating.   Statins     Tingling with several statins   Repatha [Evolocumab]     Stated she didn't tolerate it and switched to Praluent     Past Medical History:  Diagnosis Date   Allergy    enviromental   Anxiety    Arthritis    osteoarthritis   COPD (chronic obstructive pulmonary disease) (HCC)    mild   Diabetes mellitus without complication ([place])    controlled by diet   Environmental allergies    GERD (gastroesophageal reflux disease)    Hepatitis    hepatitis A   History of colon polyps    Hyperlipidemia     Hypertension    IBS (irritable bowel syndrome)    Mixed hyperlipidemia 06/15/2013   PONV (postoperative nausea and vomiting)    second surgery no problems last surgery   Solitary pulmonary nodule 06/02/2019   Urgency of urination    Takes oxybutynin; under control at this time     Health Maintenance  Topic Date Due   Zoster Vaccines- Shingrix (1 of 2) Never done   COVID-19 Vaccine (3 - Pfizer risk series) 10/07/2019   MAMMOGRAM  11/13/2019   TETANUS/TDAP  02/06/2021   INFLUENZA VACCINE  02/13/2021   COLONOSCOPY  02/09/2024   DEXA SCAN  Completed   Hepatitis C Screening  Completed   HPV VACCINES  Aged Out     Immunization History  Administered Date(s) Administered   Influenza, High Dose  04/10/2017, 05/08/2019, 05/24/2020   Influenza 03/05/2018   PFIZER SARS-COV-2 Vacc 08/13/2019, 09/09/2019   PPD Test 09/21/2014   Pneumococcal -13 10/22/2017   Pneumococcal -23 02/07/2011, 01/26/2016   Tdap 02/07/2011     Last Colon - 02/08/2014 - Dr Deatra Ina - recc 5 year f/u - overdue & per Dr Loletha Carrow pending Cardiology consultation.    Last MGM - 11/13/18 - overdue & patient aware.   Past Surgical History:  Procedure Laterality Date   APPENDECTOMY     removed as child   BACK SURGERY     lumbar fusion   CATARACT EXTRACTION Left 2019   COLONOSCOPY     DILATION AND CURETTAGE OF UTERUS     LUMBAR FUSION  02/06/2012   L 4  L5   PARTIAL HYSTERECTOMY     POLYPECTOMY     TOTAL HIP ARTHROPLASTY  06/02/2012   Procedure: TOTAL HIP ARTHROPLASTY;  Surgeon: Kerin Salen, MD;  Location: [place];  Service: Orthopedics;  Laterality: Right;   TOTAL HIP ARTHROPLASTY Left 01/25/2016   TOTAL HIP ARTHROPLASTY Left 01/25/2016   Procedure: LEFT TOTAL HIP ARTHROPLASTY;  Surgeon: Frederik Pear, MD;  Location: [place];  Service: Orthopedics;  Laterality: Left;   TOTAL KNEE ARTHROPLASTY Right 12/15/2012   Procedure: TOTAL KNEE ARTHROPLASTY;  Surgeon: Kerin Salen, MD;  Location: [place];  Service: Orthopedics;   Laterality: Right;   TOTAL KNEE ARTHROPLASTY Left 06/18/2016   Procedure: TOTAL KNEE ARTHROPLASTY;  Surgeon: Frederik Pear, MD;  Location: [place];  Service: Orthopedics;  Laterality: Left;   UPPER GASTROINTESTINAL ENDOSCOPY  05/06/2019     Family History  Problem Relation Age of Onset   Diabetes Mother    Colon cancer Mother 95  Hypertension Father    CAD Father    Colon cancer Sister 63   Lung cancer Brother 58   Hypertension Brother    Hypertension Brother    Cancer Maternal Grandmother        Unsure what kind   Peripheral Artery Disease Paternal Grandfather    Esophageal cancer Neg Hx    Rectal cancer Neg Hx    Stomach cancer Neg Hx      Social History   Tobacco Use   Smoking status: Light Smoker    Packs/day: 1.00    Years: 35.00    Pack years: 35.00    Types: Cigarettes    Start date: 68    Last attempt to quit: 11/02/2019    Years since quitting: 1.4   Smokeless tobacco: Never   Tobacco comments:    Quit 10/05/2019   Vaping Use   Vaping Use: Never used  Substance Use Topics   Alcohol use: Yes    Comment: 2-4 beers per night, advised to cut back    Drug use: Yes    Types: Marijuana    Comment: current, 5 joints/month       ROS Constitutional: Denies fever, chills, weight loss/gain, headaches, insomnia,  night sweats, and change in appetite. Does c/o fatigue. Eyes: Denies redness, blurred vision, diplopia, discharge, itchy, watery eyes.  ENT: Denies discharge, congestion, post nasal drip, epistaxis, sore throat, earache, hearing loss, dental pain, Tinnitus, Vertigo, Sinus pain, snoring.  Cardio: Denies chest pain, palpitations, irregular heartbeat, syncope, dyspnea, diaphoresis, orthopnea, PND, claudication, edema Respiratory: denies cough, dyspnea, DOE, pleurisy, hoarseness, laryngitis, wheezing.  Gastrointestinal: Denies dysphagia, heartburn, reflux, water brash, pain, cramps, nausea, vomiting, bloating, diarrhea, constipation, hematemesis, melena,  hematochezia, jaundice, hemorrhoids Genitourinary: Denies dysuria, frequency, urgency, nocturia, hesitancy, discharge, hematuria, flank pain Breast: Breast lumps, nipple discharge, bleeding.  Musculoskeletal: Denies arthralgia, myalgia, stiffness, Jt. Swelling, pain, limp, and strain/sprain. Denies falls. Skin: Denies puritis, rash, hives, warts, acne, eczema, changing in skin lesion Neuro: No weakness, tremor, incoordination, spasms, paresthesia, pain Psychiatric: Denies confusion, memory loss, sensory loss. Denies Depression. Endocrine: Denies change in weight, skin, hair change, nocturia, and paresthesia, diabetic polys, visual blurring, hyper / hypo glycemic episodes.  Heme/Lymph: No excessive bleeding, bruising, enlarged lymph nodes.  Physical Exam  BP 122/66   Pulse 82   Temp 97.6 F (36.4 C)   Resp 16   Ht 5\' 5"  (1.651 m)   Wt 160 lb 12.8 oz (72.9 kg)   SpO2 96%   BMI 26.76 kg/m   General Appearance: Well nourished, well groomed and in no apparent distress.  Eyes: PERRLA, EOMs, conjunctiva no swelling or erythema, normal fundi and vessels. Sinuses: No frontal/maxillary tenderness ENT/Mouth: EACs patent / TMs  nl. Nares clear without erythema, swelling, mucoid exudates. Oral hygiene is good. No erythema, swelling, or exudate. Tongue normal, non-obstructing. Tonsils not swollen or erythematous. Hearing normal.  Neck: Supple, thyroid not palpable. No bruits, nodes or JVD. Respiratory: Respiratory effort normal.  BS equal and clear bilateral without rales, rhonci, wheezing or stridor. Cardio: Heart sounds are normal with regular rate and rhythm and no murmurs, rubs or gallops. Peripheral pulses are normal and equal bilaterally without edema. No aortic or femoral bruits. Chest: symmetric with normal excursions and percussion. Breasts: Symmetric, without lumps, nipple discharge, retractions, or fibrocystic changes.  Abdomen: Flat, soft with bowel sounds active. Nontender, no  guarding, rebound, hernias, masses, or organomegaly.  Lymphatics: Non tender without lymphadenopathy.  Genitourinary:  Musculoskeletal: Full ROM all peripheral extremities,  joint stability, 5/5 strength, and normal gait. Skin: Warm and dry without rashes, lesions, cyanosis, clubbing or  ecchymosis.  Neuro: Cranial nerves intact, reflexes equal bilaterally. Normal muscle tone, no cerebellar symptoms. Sensation intact.  Pysch: Alert and oriented X 3, normal affect, Insight and Judgment appropriate.    Assessment and Plan  1. Annual Preventative Screening Examination   2. Essential hypertension  - EKG 12-Lead - CBC with Differential/Platelet - COMPLETE METABOLIC PANEL WITH GFR - Magnesium - TSH  3. Hyperlipidemia, mixed  - EKG 12-Lead - Lipid panel - TSH  4. Abnormal glucose  - EKG 12-Lead - Hemoglobin A1c - Insulin, random  5. Vitamin D deficiency  - VITAMIN D 25 Hydroxy  6. Aortic atherosclerosis by Abd CT scan in 2018  [facility])  - EKG 12-Lead  7. Statin intolerance   8. Gastroesophageal reflux disease   - CBC with Differential/Platelet  9. Chronic obstructive pulmonary disease ([place])   10. OSA on CPAP   11. Screening for ischemic heart disease  - EKG 12-Lead  12. FHx: heart disease  - EKG 12-Lead  13. Former smoker  - EKG 12-Lead  14. Medication management  - Urinalysis, Routine w reflex microscopic - Microalbumin / creatinine urine ratio - CBC with Differential/Platelet - COMPLETE METABOLIC PANEL WITH GFR - Magnesium - Lipid panel - TSH - Hemoglobin A1c - Insulin, random - VITAMIN D 25 Hydroxy   15. Screening for colorectal cancer  - POC Hemoccult Bld/Stl          Patient was counseled in prudent diet to achieve/maintain BMI less than 25 for weight control, BP monitoring, regular exercise and medications. Discussed med's effects and SE's. Screening labs and tests as requested with regular follow-up as recommended. Over 40 minutes of  exam, counseling, chart review and high complex critical decision making was performed.   Kirtland Bouchard, MD

## 2021-04-23 NOTE — Patient Instructions (Signed)

## 2021-04-24 ENCOUNTER — Other Ambulatory Visit: Payer: Self-pay

## 2021-04-24 ENCOUNTER — Encounter: Payer: Self-pay | Admitting: Internal Medicine

## 2021-04-24 ENCOUNTER — Ambulatory Visit (INDEPENDENT_AMBULATORY_CARE_PROVIDER_SITE_OTHER): Payer: Medicare Other | Admitting: Internal Medicine

## 2021-04-24 DIAGNOSIS — K2100 Gastro-esophageal reflux disease with esophagitis, without bleeding: Secondary | ICD-10-CM | POA: Diagnosis not present

## 2021-04-24 DIAGNOSIS — I10 Essential (primary) hypertension: Secondary | ICD-10-CM | POA: Diagnosis not present

## 2021-04-24 DIAGNOSIS — Z789 Other specified health status: Secondary | ICD-10-CM

## 2021-04-24 DIAGNOSIS — Z23 Encounter for immunization: Secondary | ICD-10-CM | POA: Diagnosis not present

## 2021-04-24 DIAGNOSIS — Z8249 Family history of ischemic heart disease and other diseases of the circulatory system: Secondary | ICD-10-CM | POA: Diagnosis not present

## 2021-04-24 DIAGNOSIS — E782 Mixed hyperlipidemia: Secondary | ICD-10-CM | POA: Diagnosis not present

## 2021-04-24 DIAGNOSIS — Z79899 Other long term (current) drug therapy: Secondary | ICD-10-CM

## 2021-04-24 DIAGNOSIS — Z9989 Dependence on other enabling machines and devices: Secondary | ICD-10-CM

## 2021-04-24 DIAGNOSIS — Z0001 Encounter for general adult medical examination with abnormal findings: Secondary | ICD-10-CM

## 2021-04-24 DIAGNOSIS — Z87891 Personal history of nicotine dependence: Secondary | ICD-10-CM | POA: Diagnosis not present

## 2021-04-24 DIAGNOSIS — Z136 Encounter for screening for cardiovascular disorders: Secondary | ICD-10-CM

## 2021-04-24 DIAGNOSIS — Z0000 Encounter for general adult medical examination without abnormal findings: Secondary | ICD-10-CM | POA: Diagnosis not present

## 2021-04-24 DIAGNOSIS — Z1212 Encounter for screening for malignant neoplasm of rectum: Secondary | ICD-10-CM

## 2021-04-24 DIAGNOSIS — R7309 Other abnormal glucose: Secondary | ICD-10-CM

## 2021-04-24 DIAGNOSIS — Z1211 Encounter for screening for malignant neoplasm of colon: Secondary | ICD-10-CM

## 2021-04-24 DIAGNOSIS — G4733 Obstructive sleep apnea (adult) (pediatric): Secondary | ICD-10-CM

## 2021-04-24 DIAGNOSIS — I700 Atherosclerosis of aorta: Secondary | ICD-10-CM

## 2021-04-24 DIAGNOSIS — E559 Vitamin D deficiency, unspecified: Secondary | ICD-10-CM | POA: Diagnosis not present

## 2021-04-24 DIAGNOSIS — J449 Chronic obstructive pulmonary disease, unspecified: Secondary | ICD-10-CM

## 2021-04-25 ENCOUNTER — Other Ambulatory Visit: Payer: Self-pay | Admitting: Adult Health

## 2021-04-25 DIAGNOSIS — F419 Anxiety disorder, unspecified: Secondary | ICD-10-CM

## 2021-04-25 DIAGNOSIS — F330 Major depressive disorder, recurrent, mild: Secondary | ICD-10-CM

## 2021-04-25 LAB — CBC WITH DIFFERENTIAL/PLATELET
Absolute Monocytes: 384 cells/uL (ref 200–950)
Basophils Absolute: 31 cells/uL (ref 0–200)
Basophils Relative: 0.5 %
Eosinophils Absolute: 99 cells/uL (ref 15–500)
Eosinophils Relative: 1.6 %
HCT: 39.7 % (ref 35.0–45.0)
Hemoglobin: 13.1 g/dL (ref 11.7–15.5)
Lymphs Abs: 2195 cells/uL (ref 850–3900)
MCH: 26.6 pg — ABNORMAL LOW (ref 27.0–33.0)
MCHC: 33 g/dL (ref 32.0–36.0)
MCV: 80.5 fL (ref 80.0–100.0)
MPV: 10.3 fL (ref 7.5–12.5)
Monocytes Relative: 6.2 %
Neutro Abs: 3491 cells/uL (ref 1500–7800)
Neutrophils Relative %: 56.3 %
Platelets: 289 10*3/uL (ref 140–400)
RBC: 4.93 10*6/uL (ref 3.80–5.10)
RDW: 13.7 % (ref 11.0–15.0)
Total Lymphocyte: 35.4 %
WBC: 6.2 10*3/uL (ref 3.8–10.8)

## 2021-04-25 LAB — URINALYSIS, ROUTINE W REFLEX MICROSCOPIC
Bilirubin Urine: NEGATIVE
Glucose, UA: NEGATIVE
Hgb urine dipstick: NEGATIVE
Ketones, ur: NEGATIVE
Leukocytes,Ua: NEGATIVE
Nitrite: NEGATIVE
Protein, ur: NEGATIVE
Specific Gravity, Urine: 1.015 (ref 1.001–1.035)
pH: 6 (ref 5.0–8.0)

## 2021-04-25 LAB — TSH: TSH: 0.79 mIU/L (ref 0.40–4.50)

## 2021-04-25 LAB — COMPLETE METABOLIC PANEL WITH GFR
AG Ratio: 1.7 (calc) (ref 1.0–2.5)
ALT: 11 U/L (ref 6–29)
AST: 15 U/L (ref 10–35)
Albumin: 4.2 g/dL (ref 3.6–5.1)
Alkaline phosphatase (APISO): 68 U/L (ref 37–153)
BUN: 18 mg/dL (ref 7–25)
CO2: 27 mmol/L (ref 20–32)
Calcium: 9.5 mg/dL (ref 8.6–10.4)
Chloride: 105 mmol/L (ref 98–110)
Creat: 0.78 mg/dL (ref 0.60–1.00)
Globulin: 2.5 g/dL (calc) (ref 1.9–3.7)
Glucose, Bld: 96 mg/dL (ref 65–99)
Potassium: 3.8 mmol/L (ref 3.5–5.3)
Sodium: 141 mmol/L (ref 135–146)
Total Bilirubin: 0.4 mg/dL (ref 0.2–1.2)
Total Protein: 6.7 g/dL (ref 6.1–8.1)
eGFR: 82 mL/min/{1.73_m2} (ref >=60)

## 2021-04-25 LAB — HEMOGLOBIN A1C
Hgb A1c MFr Bld: 5.5 % of total Hgb (ref <5.7)
Mean Plasma Glucose: 111 mg/dL
eAG (mmol/L): 6.2 mmol/L

## 2021-04-25 LAB — INSULIN, RANDOM: Insulin: 9.2 u[IU]/mL

## 2021-04-25 LAB — MICROALBUMIN / CREATININE URINE RATIO
Creatinine, Urine: 71 mg/dL (ref 20–275)
Microalb Creat Ratio: 14 mcg/mg creat (ref <30)
Microalb, Ur: 1 mg/dL

## 2021-04-25 LAB — LIPID PANEL
Cholesterol: 174 mg/dL (ref <200)
HDL: 85 mg/dL (ref >=50)
LDL Cholesterol (Calc): 72 mg/dL (calc)
Non-HDL Cholesterol (Calc): 89 mg/dL (calc) (ref <130)
Total CHOL/HDL Ratio: 2 (calc) (ref <5.0)
Triglycerides: 89 mg/dL (ref <150)

## 2021-04-25 LAB — MAGNESIUM: Magnesium: 1.7 mg/dL (ref 1.5–2.5)

## 2021-04-25 LAB — VITAMIN D 25 HYDROXY (VIT D DEFICIENCY, FRACTURES): Vit D, 25-Hydroxy: 62 ng/mL (ref 30–100)

## 2021-04-25 NOTE — Progress Notes (Signed)
============================================================ -   Test results slightly outside the reference range are not unusual. If there is anything important, I will review this with you,  otherwise it is considered normal test values.  If you have further questions,  please do not hesitate to contact me at the office or via My Chart.  ============================================================ ============================================================  -  Total Chol = 154      &     LDL Chol  72  - Both  Excellent   - Very low risk for Heart Attack  / Stroke ============================================================ ============================================================  -  A1c - Normal -no Diabetes - Great  ! ============================================================ ============================================================  - Vitamin D = 62 -  Excellent   - Very low risk for Heart Attack  / Stroke ============================================================ ============================================================  -  All Else - CBC - Kidneys - Electrolytes - Liver - Magnesium & Thyroid    - all  Normal / OK ============================================================ ============================================================  -  Keep up the [place] Work  !  ============================================================ ============================================================

## 2021-05-30 ENCOUNTER — Other Ambulatory Visit: Payer: Self-pay | Admitting: Cardiology

## 2021-07-24 NOTE — Progress Notes (Deleted)
3 MONTH FOLLOW UP  Assessment:     COPD 0/Centrilobular emphysema Well controlled, followed by pulm  Essential hypertension Continue medications Monitor blood pressure at home; call if consistently over 130/80 Continue DASH diet.   Reminder to go to the ER if any CP, SOB, nausea, dizziness, severe HA, changes vision/speech, left arm numbness and tingling and jaw pain.  Aortic atherosclerosis by Abd CT scan [facility]) Per CT 05/2019 Control blood pressure, cholesterol, glucose, increase exercise.   Gastroesophageal reflux disease, esophagitis presence not specified Well controlled with Omeprazole and PRN carafate; advised STOP NSAID Discussed diet, avoiding triggers and other lifestyle changes  Vitamin D deficiency Continue supplementation for goal 70-100 Check vitamin D level  Other abnormal glucose Recent A1Cs at goal Discussed diet/exercise, weight management  Defer A1C; check CMP  Medication management CBC, CMP/GFR, magnesium   Hyperlipidemia, mixed Continue medications: on praluent via lipid clinic Continue low cholesterol diet and exercise.  Check lipid panel, TSH   Former smoker Commended cessation and maintenance; continue to monitor  Doing annual low dose CT; negative 05/2019, due, follow up ordered  Anxiety/insomnia Recently improved, rare use of benzo  good sleep hygiene discussed, increase day time activity  OSA on CPAP She reports hasn't been on CPAP, wasn't able to tolerate due to restless sleep Discussed with [place] [facility] who prescribed but reports wasn't given any recommendations Questions accuracy of original test, states had to remove for significant portion of study She would like second opinion and possible repeat study; will refer to Dr. Brett Fairy  Need for influenza vaccine High dose quadrivalent administered without complication today    Over 30 minutes of exam, counseling, chart review and critical decision making was performed Future  Appointments  Date Time Provider [place]  07/25/2021 11:30 AM Magda Bernheim, NP [facility] None  10/24/2021 11:30 AM Unk Pinto, MD [facility] None  04/24/2022 11:00 AM Unk Pinto, MD [facility] None     Subjective:  [name] is a 71 y.o. female who presents for 3 month follow up.   She was diagnosed with mild OSA per home sleep study in 2019, was recommended CPAP, she was struggling due to restless sleep, mask keeps ripping off, [place] pul[facility] started and she told them was unable to tolerate but reports wasn't given any recommendations other than 1 year follow up, she questions accuracy of test and interested in second opinion/possible repeat study.  she has a diagnosis of anxiety and is currently on clonazepam 0.25 - 0.5 mg, reports symptoms are fairly controlled on current regimen. she reports use is highly variable, recently hasn't used in weeks. Last filled 30 tabs on 09/2019.   she has a diagnosis of GERD which is currently managed by omeprazole 40 mg daily, had recent EGD in 04/2019 which was unremarkable excepting inflammation.   She is a ormer smoker, 35 pack year history, quit 11/02/2019.  Since quitting, former persistent cough and hoarseness is improved  She had CT lung cancer screening 05/2019 which was negative.   Has COPD on spiriva, follows with Dr. Lamonte Sakai.  BMI is There is no height or weight on file to calculate BMI., she has been working on diet and exercise, exercise limited by pandemic, but trying to be more active  Wt Readings from Last 3 Encounters:  04/24/21 160 lb 12.8 oz (72.9 kg)  03/06/21 162 lb (73.5 kg)  01/24/21 163 lb (73.9 kg)   She follows with Dr. Stanford Breed; Stress echocardiogram October 2018 showed no stress-induced wall motion  abnormalities. ECHO 11/2019 showed normal LV function, EF 60-65%, mild AI. Carotid Dopplers 12/2019 showed less than 39% bilateral stenosis. She has CAD and aortic atherosclerosis by CT 05/2019.    Her blood  pressure has been controlled at home, today their BP is   She does not workout. She denies chest pain, shortness of breath, dizziness.   She is on cholesterol medication (she is on praluent injectible via cardiology, had allergic reaction repatha, had tingling with numerous statins) and denies myalgias. Her cholesterol is at goal. The cholesterol last visit was:   Lab Results  Component Value Date   CHOL 174 04/24/2021   HDL 85 04/24/2021   LDLCALC 72 04/24/2021   TRIG 89 04/24/2021   CHOLHDL 2.0 04/24/2021    She has been working on diet and exercise for glucose management, and denies foot ulcerations, increased appetite, nausea, paresthesia of the feet, polydipsia, polyuria, visual disturbances, vomiting and weight loss. Last A1C in the office was:  Lab Results  Component Value Date   HGBA1C 5.5 04/24/2021   Last GFR: Lab Results  Component Value Date   GFRNONAA 76 09/02/2020   Patient is on Vitamin D supplement and at recent check:   Lab Results  Component Value Date   VD25OH 62 04/24/2021      Medication Review: Current Outpatient Medications on File Prior to Visit  Medication Sig Dispense Refill   Alirocumab (PRALUENT) 150 MG/ML SOAJ Inject 150 mg into the skin every 14 (fourteen) days. 2 mL 11   Ascorbic Acid (VITAMIN C) 1000 MG tablet Take 1,000 mg by mouth daily.     aspirin 81 MG tablet Take 81 mg by mouth every other day.      bisoprolol-hydrochlorothiazide (ZIAC) 10-6.25 MG tablet TAKE 1/2 TABLET BY MOUTH DAILY 45 tablet 3   buPROPion (WELLBUTRIN XL) 150 MG 24 hr tablet TAKE 1 TABLET(150 MG) BY MOUTH EVERY MORNING 90 tablet 0   Cholecalciferol (VITAMIN D3) 5000 UNITS TABS Take 5,000 Units by mouth daily.     clonazePAM (KLONOPIN) 0.5 MG tablet Take 1/2 - 1 tablet 1 to 2  x /day ONLY if needed for Anxiety Attack &  limit to 5 days /week to avoid Addiction & Dementia 90 tablet 0   clopidogrel (PLAVIX) 75 MG tablet Take  1 tablet  Daily  to Prevent Blood Clots 90 tablet  0   Cyanocobalamin (VITAMIN B12 PO) Take 1,000 mcg by mouth daily.     fluticasone (FLONASE) 50 MCG/ACT nasal spray 1 Spray each nostril daily 48 g 1   losartan (COZAAR) 100 MG tablet TAKE 1 TABLET BY MOUTH DAILY FOR BLOOD PRESSURE 90 tablet 3   methylcellulose oral powder Take by mouth daily.      montelukast (SINGULAIR) 10 MG tablet TAKE 1 TABLET(10 MG) BY MOUTH DAILY 90 tablet 3   Multiple Vitamins-Minerals (MULTI COMPLETE PO) Take 1 capsule by mouth daily.     NIFEdipine (PROCARDIA XL/NIFEDICAL XL) 60 MG 24 hr tablet Take 2 tablets (120 mg total) by mouth daily. 180 tablet 3   Omega-3 Fatty Acids (FISH OIL) 1000 MG CAPS Take 1 capsule (1,000 mg total) by mouth 2 (two) times daily with a meal. 60 capsule 11   omeprazole (PRILOSEC) 40 MG capsule Take  1 capsule  2 x /day (every 12 hours) to Prevent Heartburn & Indigestion / Patient knows to take by mouth 180 capsule 3   PEG-KCl-NaCl-NaSulf-Na Asc-C (PLENVU) 140 g SOLR Use as directed for colonoscopy prep.[id number] IPJ:[id number] NLZJQ:[id number]  [id number] 1 each 0   Tiotropium Bromide Monohydrate (SPIRIVA RESPIMAT IN) Inhale 1 puff into the lungs 2 (two) times daily.     Zinc 50 MG TABS Take by mouth daily.     No current facility-administered medications on file prior to visit.    Allergies  Allergen Reactions   Enalapril Swelling    Caused lips to swell and bloating.   Statins     Tingling with several statins   Repatha [Evolocumab]     Stated she didn't tolerate it and switched to Praluent    Current Problems (verified) Patient Active Problem List   Diagnosis Date Noted   Palpitations 12/07/2020   3-vessel CAD 09/15/2020   Screening for ischemic heart disease 09/02/2020   FHx: heart disease 09/02/2020   Allergic rhinitis 01/06/2020   Statin Intolerance 12/12/2019   OSA (obstructive sleep apnea) 02/26/2018   COPD GOLD A-B 12/02/2017   Recurrent mild major depressive disorder with anxiety ([place]) 10/22/2017   Former smoker  10/22/2017   Aortic atherosclerosis by Abd CT scan in 2018  ([place]) 05/16/2017   GERD (gastroesophageal reflux disease) 04/10/2017   Other abnormal glucose 03/20/2014   Essential hypertension 06/15/2013   Hyperlipidemia, mixed 06/15/2013   Vitamin D deficiency 06/15/2013    SURGICAL HISTORY She  has a past surgical history that includes Appendectomy; Partial hysterectomy; Lumbar fusion (02/06/2012); Back surgery; Dilation and curettage of uterus; Total hip arthroplasty (06/02/2012); Total knee arthroplasty (Right, 12/15/2012); Total hip arthroplasty (Left, 01/25/2016); Total hip arthroplasty (Left, 01/25/2016); Total knee arthroplasty (Left, 06/18/2016); Colonoscopy; Upper gastrointestinal endoscopy (05/06/2019); Polypectomy; and Cataract extraction (Left, 2019). FAMILY HISTORY Her family history includes CAD in her father; Cancer in her maternal grandmother; Colon cancer (age of onset: 22) in her sister; Colon cancer (age of onset: 45) in her mother; Diabetes in her mother; Hypertension in her brother, brother, and father; Lung cancer (age of onset: 75) in her brother; Peripheral Artery Disease in her paternal grandfather. SOCIAL HISTORY She  reports that she has been smoking cigarettes. She started smoking about 38 years ago. She has a 35.00 pack-year smoking history. She has never used smokeless tobacco. She reports current alcohol use. She reports current drug use. Drug: Marijuana.    Review of Systems  Constitutional:  Negative for malaise/fatigue and weight loss.  HENT:  Negative for congestion, hearing loss, sore throat and tinnitus.   Eyes:  Negative for blurred vision and double vision.  Respiratory:  Negative for cough (improved), sputum production, shortness of breath and wheezing.   Cardiovascular:  Negative for chest pain, palpitations, orthopnea, claudication, leg swelling and PND.  Gastrointestinal:  Negative for abdominal pain, blood in stool, constipation, diarrhea, heartburn,  melena, nausea and vomiting.  Genitourinary: Negative.   Musculoskeletal:  Negative for falls, joint pain and myalgias.  Skin:  Negative for rash.  Neurological:  Negative for dizziness, tingling, sensory change, weakness and headaches.  Endo/Heme/Allergies:  Negative for environmental allergies and polydipsia.  Psychiatric/Behavioral: Negative.  Negative for depression, memory loss, substance abuse and suicidal ideas. The patient is not nervous/anxious and does not have insomnia.   All other systems reviewed and are negative.   Objective:     There were no vitals filed for this visit.  There is no height or weight on file to calculate BMI.  General appearance: alert, no distress, WD/WN, female HEENT: normocephalic, sclerae anicteric, TMs pearly, nares patent, no discharge or erythema, pharynx normal Oral cavity: MMM, no lesions Neck: supple, no lymphadenopathy, no thyromegaly, no  masses Heart: RRR, normal S1, S2, no murmurs Lungs: Scattered rhonchi to bilateral bases without wheezes or rales Abdomen: +bs, soft, non tender, non distended, no masses, no hepatomegaly, no splenomegaly Musculoskeletal: nontender, no swelling, no obvious deformity Extremities: no edema, no cyanosis, no clubbing Pulses: 2+ symmetric, upper and lower extremities, normal cap refill Neurological: alert, oriented x 3, CN2-12 intact, strength normal upper extremities and lower extremities, sensation normal throughout, DTRs 2+ throughout, no cerebellar signs, gait normal Psychiatric: normal affect, behavior normal, pleasant    Magda Bernheim, NP   07/24/2021

## 2021-07-25 ENCOUNTER — Ambulatory Visit: Payer: Medicare Other | Admitting: Nurse Practitioner

## 2021-07-27 ENCOUNTER — Other Ambulatory Visit: Payer: Self-pay | Admitting: Nurse Practitioner

## 2021-07-27 DIAGNOSIS — F330 Major depressive disorder, recurrent, mild: Secondary | ICD-10-CM

## 2021-07-27 DIAGNOSIS — F419 Anxiety disorder, unspecified: Secondary | ICD-10-CM

## 2021-09-29 ENCOUNTER — Other Ambulatory Visit: Payer: Self-pay | Admitting: *Deleted

## 2021-09-29 DIAGNOSIS — F17200 Nicotine dependence, unspecified, uncomplicated: Secondary | ICD-10-CM

## 2021-09-29 DIAGNOSIS — Z87891 Personal history of nicotine dependence: Secondary | ICD-10-CM

## 2021-10-17 ENCOUNTER — Ambulatory Visit
Admission: RE | Admit: 2021-10-17 | Discharge: 2021-10-17 | Disposition: A | Discharge status: Home / Self Care | Payer: Medicare Other | Source: Ambulatory Visit | Attending: Acute Care | Admitting: Acute Care

## 2021-10-17 DIAGNOSIS — Z87891 Personal history of nicotine dependence: Secondary | ICD-10-CM

## 2021-10-17 DIAGNOSIS — F17210 Nicotine dependence, cigarettes, uncomplicated: Secondary | ICD-10-CM | POA: Diagnosis not present

## 2021-10-17 DIAGNOSIS — F17200 Nicotine dependence, unspecified, uncomplicated: Secondary | ICD-10-CM

## 2021-10-18 ENCOUNTER — Telehealth: Payer: Self-pay | Admitting: Acute Care

## 2021-10-18 NOTE — Telephone Encounter (Signed)
I have attempted to call the patient with the results of her low-dose CT.  There was no answer.  I have left a HIPAA compliant message on patient's voicemail with the office contact where she can call to get the results of her screening scan.  If we do not hear from her shortly we will return the call tomorrow. ?Dr. Lamonte Sakai, I have added you to this message as this patient sees you in the clinic.  She will need a 88-monthfollow-up.  I will attempt to get in touch with her again tomorrow. ? ?DLangley Gaussand DKonterra once I have informed the patient of her results and Dr. BLamonte Sakaihas reviewed the scan we will determine appropriate follow-up imaging.  Per lung RADS protocol and will be a 366-monthollow-up however I will let Dr. ByLamonte Sakaiook to make sure he agrees. ? ?Once follow-up has been determined, we will fax results to PCP and place order for follow-up.  Thanks so much ?

## 2021-10-18 NOTE — Telephone Encounter (Signed)
Spoke with [facility]  ?Call report on LDCT 10/17/21 Impression: ? ?IMPRESSION: ?1. Lung-RADS 4A, suspicious. Follow up low-dose chest CT without ?contrast in 3 months (please use the following order, "CT CHEST LCS ?NODULE FOLLOW-UP W/O CM") is recommended. Alternatively, PET may be ?considered when there is a solid component 33m or larger. ?  ?Pulmonary nodules measure up to 7.0 mm in the posterior segment ?right upper lobe. ?  ?These results will be called to the ordering clinician or ?representative by the Radiologist Assistant, and communication ?documented in the PACS or [employer] ?2. Aortic atherosclerosis (ICD10-I70.0). Coronary artery ?calcification. ?3.  Emphysema (ICD10-J43.9). ?  ?  ?Electronically Signed ?  By: MLorin PicketM.D. ?  On: 10/18/2021 10:35 ?   ? ?

## 2021-10-19 ENCOUNTER — Telehealth: Payer: Self-pay | Admitting: Acute Care

## 2021-10-19 DIAGNOSIS — F17200 Nicotine dependence, unspecified, uncomplicated: Secondary | ICD-10-CM

## 2021-10-19 DIAGNOSIS — Z87891 Personal history of nicotine dependence: Secondary | ICD-10-CM

## 2021-10-19 DIAGNOSIS — Z122 Encounter for screening for malignant neoplasm of respiratory organs: Secondary | ICD-10-CM

## 2021-10-19 NOTE — Telephone Encounter (Signed)
See telephone note 10/19/2021 ?

## 2021-10-19 NOTE — Telephone Encounter (Signed)
CT results faxed to PCP with f/u plans included. Order placed for 3 mth nodule f/u CT.  °

## 2021-10-19 NOTE — Telephone Encounter (Signed)
I agree with the plan for follow up CT 3 months.  ?

## 2021-10-19 NOTE — Telephone Encounter (Signed)
I have called the patient with the results of her low dose Ct Chest. I explained that her scan was read as a Lung RADS 4 A : suspicious findings, either short term follow up in 3 months or alternatively  PET Scan evaluation may be considered when there is a solid component of  8 mm or larger. I explained that there is a new 7 mm nodule in the RUL that we will need to take another look at in 3 months, to re-evaluate its size. She is in agreement with this plan.  ?Denise, please place order for 3 month follow up low dose CT, and fax results to PCP. Let him know plan is for a 3 month follow up. Thanks so much ?

## 2021-10-23 ENCOUNTER — Encounter: Payer: Self-pay | Admitting: Internal Medicine

## 2021-10-23 NOTE — Progress Notes (Signed)
? ? ? ?Future Appointments  ?Date Time Provider Department  ?10/24/2021 11:30 AM Unk Pinto, MD [facility]  ?04/24/2022 11:00 AM Unk Pinto, MD [facility]  ? ? ?History of Present Illness: ? ? ?    This very nice 71 y.o.  [name] presents for 6 month follow up with HTN, HLD, Pre-Diabetes and Vitamin D Deficiency.   In 2018,  Abd CT scan showed Aortic Atherosclerosis.   Patient is on CPAP for OSA with COPD overlap  with improved sleep hygiene followed by Dr Lamonte Sakai.  Patient's GERD is controlled on diet & Omeprazole. She also has a major Depressive disorder controlled on Bupropion.  ?   ? ?    Patient is treated for HTN  ( 1988) & BP has been controlled at home. Today?s BP: 140/70.    Patient has evaluation by Dr Stanford Breed for Coronary Artery calcification & Stress cardiac echo was Negative. Patient has had no complaints of any cardiac type chest pain, palpitations, dyspnea / orthopnea / PND, dizziness, claudication, or dependent edema. ? ? ?    Hyperlipidemia is controlled with diet & was intolerant of Statins, but is controlled on Praulent & Fish Oil . Patient denies myalgias or other med SE?s. Last Lipids were at goal : ? ?Lab Results  ?Component Value Date  ? CHOL 174 04/24/2021  ? HDL 85 04/24/2021  ? [place] 72 04/24/2021  ? TRIG 89 04/24/2021  ? CHOLHDL 2.0 04/24/2021  ? ? ? ?Also, the patient has history of PreDiabetes   (A1c 5.7% /2017) and has had no symptoms of reactive hypoglycemia, diabetic polys, paresthesias or visual blurring.  Last A1c was normal & at goal : ? ?Lab Results  ?Component Value Date  ? HGBA1C 5.5 04/24/2021  ? ?    ? ?                                                Further, the patient also has history of Vitamin D Deficiency and supplements vitamin D without any suspected side-effects. Last vitamin D was at goal : ? ?Lab Results  ?Component Value Date  ? VD25OH 62 04/24/2021  ? ? ? ?Current Outpatient Medications on File Prior to Visit  ?Medication Sig  ?  PRALUENT 150 MG/ML  Inject 150 mg into the skin every 14 days.  ? VITAMIN C 1000 MG tablet Take daily.  ? aspirin 81 MG tablet Take every other day.   ? bisoprolol-hctz 10-6.25 MG  TAKE 1/2 TABLET DAILY  ? buPROPion XL 150 MG  Take 1 tablet  Daily  ? VITAMIN D  5000 u Take daily.  ? clonazePAM 0.5 MG tablet Take 1/2 - 1 tablet 1 to 2  x /day ONLY if needed   ? clopidogrel 75 MG tablet Take  1 tablet  Daily   ? VITAMIN B12  1,000 mcg  Take daily.  ? FLONASE  nasal spray 1 Spray each nostril daily  ? losartan  100 MG tablet TAKE 1 TABLET  DAILY   ? methylcellulose oral powder Take daily.   ? montelukast 10 MG tablet TAKE 1 TABLET  DAILY  ? Multiple Vitamins-Minerals  Take 1 capsule  daily.  ? NIFEdipine X  60 MG   Take 2 tablets (120 mg total)  daily.  ? Omega-3 FISH OIL 1000 MG  Take 1  capsule 2  times daily with a meal.  ? omeprazole 40 MG capsule Take  1 capsule  2 x /day (every 12 hours)   ? Tiotropium RESPIMAT  Inhale 1 puff  2 times daily.  ? Zinc 50 MG TABS Take daily.  ? ? ? ?Allergies  ?Allergen Reactions  ? Enalapril Swelling  ?  Caused lips to swell and bloating.  ? Statins   ?  Tingling with several statins  ? Repatha [Evolocumab]   ?  Stated she didn't tolerate it and switched to Praluent  ? ? ?PMHx:   ?Past Medical History:  ?Diagnosis Date  ? Allergy   ? enviromental  ? Anxiety   ? Arthritis   ? osteoarthritis  ? COPD (chronic obstructive pulmonary disease) ([place])   ? mild  ? Diabetes mellitus without complication ([place])   ? controlled by diet  ? Environmental allergies   ? GERD (gastroesophageal reflux disease)   ? Hepatitis   ? hepatitis A  ? History of colon polyps   ? Hyperlipidemia   ? Hypertension   ? IBS (irritable bowel syndrome)   ? Mixed hyperlipidemia 06/15/2013  ? PONV (postoperative nausea and vomiting)   ? second surgery no problems last surgery  ? Solitary pulmonary nodule 06/02/2019  ? Urgency of urination   ? Takes oxybutynin; under control at this time  ? ? ? ?Immunization History   ?Administered Date(s) Administered  ? Influenza, High Dose   04/10/2017, 05/08/2019, 05/24/2020, 04/24/2021  ? Influenza  03/05/2018  ? PFIZER SARS-COV-2 Vacc  08/13/2019, 09/09/2019  ? PPD Test 09/21/2014  ? Pneumococcal  -13 10/22/2017  ? Pneumococcal -23 02/07/2011, 01/26/2016  ? Tdap 02/07/2011  ? ? ? ?Past Surgical History:  ?Procedure Laterality Date  ? APPENDECTOMY    ? removed as child  ? BACK SURGERY    ? lumbar fusion  ? CATARACT EXTRACTION Left 2019  ? COLONOSCOPY    ? DILATION AND CURETTAGE OF UTERUS    ? LUMBAR FUSION  02/06/2012  ? L 4  L5  ? PARTIAL HYSTERECTOMY    ? POLYPECTOMY    ? TOTAL HIP ARTHROPLASTY  06/02/2012  ? Procedure: TOTAL HIP ARTHROPLASTY;  Surgeon: Kerin Salen, MD;  Location: [place];  Service: Orthopedics;  Laterality: Right;  ? TOTAL HIP ARTHROPLASTY Left 01/25/2016  ? TOTAL HIP ARTHROPLASTY Left 01/25/2016  ? Procedure: LEFT TOTAL HIP ARTHROPLASTY;  Surgeon: Frederik Pear, MD;  Location: [place];  Service: Orthopedics;  Laterality: Left;  ? TOTAL KNEE ARTHROPLASTY Right 12/15/2012  ? Procedure: TOTAL KNEE ARTHROPLASTY;  Surgeon: Kerin Salen, MD;  Location: [place];  Service: Orthopedics;  Laterality: Right;  ? TOTAL KNEE ARTHROPLASTY Left 06/18/2016  ? Procedure: TOTAL KNEE ARTHROPLASTY;  Surgeon: Frederik Pear, MD;  Location: [place];  Service: Orthopedics;  Laterality: Left;  ? UPPER GASTROINTESTINAL ENDOSCOPY  05/06/2019  ? ? ?FHx:    Reviewed / unchanged ? ?SHx:    Reviewed / unchanged  ? ?Systems Review: ? ?Constitutional: Denies fever, chills, wt changes, headaches, insomnia, fatigue, night sweats, change in appetite. ?Eyes: Denies redness, blurred vision, diplopia, discharge, itchy, watery eyes.  ?ENT: Denies discharge, congestion, post nasal drip, epistaxis, sore throat, earache, hearing loss, dental pain, tinnitus, vertigo, sinus pain, snoring.  ?CV: Denies chest pain, palpitations, irregular heartbeat, syncope, dyspnea, diaphoresis, orthopnea, PND, claudication or  edema. ?Respiratory: denies cough, dyspnea, DOE, pleurisy, hoarseness, laryngitis, wheezing.  ?Gastrointestinal: Denies dysphagia, odynophagia, heartburn, reflux, water brash, abdominal pain or cramps,  nausea, vomiting, bloating, diarrhea, constipation, hematemesis, melena, hematochezia  or hemorrhoids. ?Genitourinary: Denies dysuria, frequency, urgency, nocturia, hesitancy, discharge, hematuria or flank pain. ?Musculoskeletal: Denies arthralgias, myalgias, stiffness, jt. swelling, pain, limping or strain/sprain.  ?Skin: Denies pruritus, rash, hives, warts, acne, eczema or change in skin lesion(s). ?Neuro: No weakness, tremor, incoordination, spasms, paresthesia or pain. ?Psychiatric: Denies confusion, memory loss or sensory loss. ?Endo: Denies change in weight, skin or hair change.  ?Heme/Lymph: No excessive bleeding, bruising or enlarged lymph nodes. ? ?Physical Exam ? ?BP 140/70   Pulse 75   Temp 97.9 ?F (36.6 ?C)   Resp 16   Ht '5\' 5"'$  (1.651 m)   Wt 162 lb 3.2 oz (73.6 kg)   SpO2 96%   BMI 26.99 kg/m?  ? ?Appears  well nourished, well groomed  and in no distress. ? ?Eyes: PERRLA, EOMs, conjunctiva no swelling or erythema. ?Sinuses: No frontal/maxillary tenderness ?ENT/Mouth: EAC's clear, TM's nl w/o erythema, bulging. Nares clear w/o erythema, swelling, exudates. Oropharynx clear without erythema or exudates. Oral hygiene is good. Tongue normal, non obstructing. Hearing intact.  ?Neck: Supple. Thyroid not palpable. Car 2+/2+ without bruits, nodes or JVD. ?Chest: Respirations nl with BS clear & equal w/o rales, rhonchi, wheezing or stridor.  ?Cor: Heart sounds normal w/ regular rate and rhythm without sig. murmurs, gallops, clicks or rubs. Peripheral pulses normal and equal  without edema.  ?Abdomen: Soft & bowel sounds normal. Non-tender w/o guarding, rebound, hernias, masses or organomegaly.  ?Lymphatics: Unremarkable.  ?Musculoskeletal: Full ROM all peripheral extremities, joint stability, 5/5 strength  and normal gait.  ?Skin: Warm, dry without exposed rashes, lesions or ecchymosis apparent.  ?Neuro: Cranial nerves intact, reflexes equal bilaterally. Sensory-motor testing grossly intact. Tendon reflexes grossly intact.  ?Pysch: Alert &

## 2021-10-23 NOTE — Patient Instructions (Signed)

## 2021-10-24 ENCOUNTER — Ambulatory Visit (INDEPENDENT_AMBULATORY_CARE_PROVIDER_SITE_OTHER): Payer: Medicare Other | Admitting: Internal Medicine

## 2021-10-24 ENCOUNTER — Encounter: Payer: Self-pay | Admitting: Internal Medicine

## 2021-10-24 VITALS — BP 140/70 | HR 75 | Temp 97.9°F | Resp 16 | Ht 65.0 in | Wt 162.2 lb

## 2021-10-24 DIAGNOSIS — I700 Atherosclerosis of aorta: Secondary | ICD-10-CM | POA: Diagnosis not present

## 2021-10-24 DIAGNOSIS — Z789 Other specified health status: Secondary | ICD-10-CM | POA: Diagnosis not present

## 2021-10-24 DIAGNOSIS — Z79899 Other long term (current) drug therapy: Secondary | ICD-10-CM | POA: Diagnosis not present

## 2021-10-24 DIAGNOSIS — I10 Essential (primary) hypertension: Secondary | ICD-10-CM

## 2021-10-24 DIAGNOSIS — E782 Mixed hyperlipidemia: Secondary | ICD-10-CM | POA: Diagnosis not present

## 2021-10-24 DIAGNOSIS — R7309 Other abnormal glucose: Secondary | ICD-10-CM

## 2021-10-24 DIAGNOSIS — G4733 Obstructive sleep apnea (adult) (pediatric): Secondary | ICD-10-CM

## 2021-10-24 DIAGNOSIS — E559 Vitamin D deficiency, unspecified: Secondary | ICD-10-CM | POA: Diagnosis not present

## 2021-10-24 DIAGNOSIS — K2100 Gastro-esophageal reflux disease with esophagitis, without bleeding: Secondary | ICD-10-CM

## 2021-10-24 DIAGNOSIS — F330 Major depressive disorder, recurrent, mild: Secondary | ICD-10-CM

## 2021-10-24 DIAGNOSIS — Z9989 Dependence on other enabling machines and devices: Secondary | ICD-10-CM

## 2021-10-24 DIAGNOSIS — F419 Anxiety disorder, unspecified: Secondary | ICD-10-CM

## 2021-10-25 LAB — CBC WITH DIFFERENTIAL/PLATELET
Absolute Monocytes: 381 cells/uL (ref 200–950)
Basophils Absolute: 28 cells/uL (ref 0–200)
Basophils Relative: 0.5 %
Eosinophils Absolute: 140 cells/uL (ref 15–500)
Eosinophils Relative: 2.5 %
HCT: 40.3 % (ref 35.0–45.0)
Hemoglobin: 13.4 g/dL (ref 11.7–15.5)
Lymphs Abs: 2122 cells/uL (ref 850–3900)
MCH: 26.9 pg — ABNORMAL LOW (ref 27.0–33.0)
MCHC: 33.3 g/dL (ref 32.0–36.0)
MCV: 80.8 fL (ref 80.0–100.0)
MPV: 10.8 fL (ref 7.5–12.5)
Monocytes Relative: 6.8 %
Neutro Abs: 2929 cells/uL (ref 1500–7800)
Neutrophils Relative %: 52.3 %
Platelets: 293 10*3/uL (ref 140–400)
RBC: 4.99 10*6/uL (ref 3.80–5.10)
RDW: 14.3 % (ref 11.0–15.0)
Total Lymphocyte: 37.9 %
WBC: 5.6 10*3/uL (ref 3.8–10.8)

## 2021-10-25 LAB — MAGNESIUM: Magnesium: 1.9 mg/dL (ref 1.5–2.5)

## 2021-10-25 LAB — COMPLETE METABOLIC PANEL WITH GFR
AG Ratio: 1.8 (calc) (ref 1.0–2.5)
ALT: 9 U/L (ref 6–29)
AST: 15 U/L (ref 10–35)
Albumin: 4.1 g/dL (ref 3.6–5.1)
Alkaline phosphatase (APISO): 72 U/L (ref 37–153)
BUN: 17 mg/dL (ref 7–25)
CO2: 30 mmol/L (ref 20–32)
Calcium: 9.4 mg/dL (ref 8.6–10.4)
Chloride: 107 mmol/L (ref 98–110)
Creat: 0.78 mg/dL (ref 0.60–1.00)
Globulin: 2.3 g/dL (calc) (ref 1.9–3.7)
Glucose, Bld: 91 mg/dL (ref 65–99)
Potassium: 4.2 mmol/L (ref 3.5–5.3)
Sodium: 144 mmol/L (ref 135–146)
Total Bilirubin: 0.4 mg/dL (ref 0.2–1.2)
Total Protein: 6.4 g/dL (ref 6.1–8.1)
eGFR: 81 mL/min/{1.73_m2} (ref >=60)

## 2021-10-25 LAB — VITAMIN D 25 HYDROXY (VIT D DEFICIENCY, FRACTURES): Vit D, 25-Hydroxy: 52 ng/mL (ref 30–100)

## 2021-10-25 LAB — INSULIN, RANDOM: Insulin: 6 u[IU]/mL

## 2021-10-25 LAB — LIPID PANEL
Cholesterol: 172 mg/dL (ref <200)
HDL: 86 mg/dL (ref >=50)
LDL Cholesterol (Calc): 67 mg/dL (calc)
Non-HDL Cholesterol (Calc): 86 mg/dL (calc) (ref <130)
Total CHOL/HDL Ratio: 2 (calc) (ref <5.0)
Triglycerides: 106 mg/dL (ref <150)

## 2021-10-25 LAB — HEMOGLOBIN A1C
Hgb A1c MFr Bld: 5.5 % of total Hgb (ref <5.7)
Mean Plasma Glucose: 111 mg/dL
eAG (mmol/L): 6.2 mmol/L

## 2021-10-25 LAB — TSH: TSH: 0.54 mIU/L (ref 0.40–4.50)

## 2021-10-25 NOTE — Progress Notes (Signed)
<><><><><><><><><><><><><><><><><><><><><><><><><><><><><><><><><> ?<><><><><><><><><><><><><><><><><><><><><><><><><><><><><><><><><> ?-   Test results slightly outside the reference range are not unusual. ?If there is anything important, I will review this with you,  ?otherwise it is considered normal test values.  ?If you have further questions,  ?please do not hesitate to contact me at the office or via My Chart.  ?<><><><><><><><><><><><><><><><><><><><><><><><><><><><><><><><><> ?<><><><><><><><><><><><><><><><><><><><><><><><><><><><><><><><><> ? ?- Total Chol = 172   &  LDL Chol = 67  - Both  Excellent  ? ?- Very low risk for Heart Attack  / Stroke ?<><><><><><><><><><><><><><><><><><><><><><><><><><><><><><><><><> ?<><><><><><><><><><><><><><><><><><><><><><><><><><><><><><><><><> ? ?-   A1c - Normal - No Diabetes  - Great ! ?<><><><><><><><><><><><><><><><><><><><><><><><><><><><><><><><><> ?<><><><><><><><><><><><><><><><><><><><><><><><><><><><><><><><><> ? ?-   Vitamin D = 523 - Sl Low  ? ?- Vitamin D goal is between 70-100.  ? ?- Please INCREASE  your Vitamin D 5,000 unit capsules to  ?                                                                      2 capsules = 10,000 units EVERY day  ? ?- It is very important as a natural anti-inflammatory and helping the  ?immune system protect against viral infections, like the Covid-19  ? ? ?helping hair, skin, and nails, as well as reducing stroke and  ?heart attack risk.  ? ?- It helps your bones and helps with mood. ? ?- It also decreases numerous cancer risks so please  ?take it as directed.  ? ?- Low Vit D is associated with a 200-300% higher risk for  ?CANCER  ? ?and 200-300% higher risk for HEART   ATTACK  &  STROKE.   ? ?- It is also associated with higher death rate at younger ages,  ? ?autoimmune diseases like Rheumatoid arthritis, Lupus,  ?Multiple Sclerosis.    ? ?- Also many other serious conditions, like depression, Alzheimer's ? ?Dementia,  infertility, muscle aches, fatigue, fibromyalgia  ? ?- just to name a few. ?<><><><><><><><><><><><><><><><><><><><><><><><><><><><><><><><><> ?<><><><><><><><><><><><><><><><><><><><><><><><><><><><><><><><><> ? ?-   All Else - CBC - Kidneys - Electrolytes - Liver - Magnesium & Thyroid   ? ?- all  Normal / OK ?<><><><><><><><><><><><><><><><><><><><><><><><><><><><><><><><><> ?<><><><><><><><><><><><><><><><><><><><><><><><><><><><><><><><><> ? ?- Keep up the [place] Work  ! ? ?<><><><><><><><><><><><><><><><><><><><><><><><><><><><><><><><><> ?<><><><><><><><><><><><><><><><><><><><><><><><><><><><><><><><><> ? ? ? ? ? ? ? ?

## 2021-12-03 ENCOUNTER — Other Ambulatory Visit: Payer: Self-pay | Admitting: Cardiology

## 2021-12-06 ENCOUNTER — Encounter: Payer: Self-pay | Admitting: Nurse Practitioner

## 2021-12-06 ENCOUNTER — Ambulatory Visit (INDEPENDENT_AMBULATORY_CARE_PROVIDER_SITE_OTHER): Payer: Medicare Other | Admitting: Nurse Practitioner

## 2021-12-06 VITALS — BP 145/78 | HR 65 | Temp 96.9°F | Wt 162.6 lb

## 2021-12-06 DIAGNOSIS — J069 Acute upper respiratory infection, unspecified: Secondary | ICD-10-CM

## 2021-12-06 DIAGNOSIS — E538 Deficiency of other specified B group vitamins: Secondary | ICD-10-CM | POA: Diagnosis not present

## 2021-12-06 DIAGNOSIS — R911 Solitary pulmonary nodule: Secondary | ICD-10-CM

## 2021-12-06 DIAGNOSIS — J301 Allergic rhinitis due to pollen: Secondary | ICD-10-CM | POA: Diagnosis not present

## 2021-12-06 DIAGNOSIS — M2550 Pain in unspecified joint: Secondary | ICD-10-CM

## 2021-12-06 DIAGNOSIS — R5383 Other fatigue: Secondary | ICD-10-CM | POA: Diagnosis not present

## 2021-12-06 DIAGNOSIS — R0602 Shortness of breath: Secondary | ICD-10-CM | POA: Diagnosis not present

## 2021-12-06 DIAGNOSIS — R6883 Chills (without fever): Secondary | ICD-10-CM | POA: Diagnosis not present

## 2021-12-06 MED ORDER — IPRATROPIUM-ALBUTEROL 0.5-2.5 (3) MG/3ML IN SOLN: 3.0000 mL | Freq: Once | RESPIRATORY_TRACT | Status: DC

## 2021-12-06 MED ORDER — AZITHROMYCIN 250 MG PO TABS: ORAL_TABLET | ORAL | 1 refills | Status: AC

## 2021-12-06 MED ORDER — MELOXICAM 7.5 MG PO TABS: ORAL_TABLET | ORAL | 2 refills | Status: DC

## 2021-12-06 NOTE — Patient Instructions (Signed)
Shortness of Breath, Adult Shortness of breath means you have trouble breathing. Shortness of breath could be a sign of a medical problem. Follow these instructions at home:  Pollution Do not smoke or use any products that contain nicotine or tobacco. If you need help quitting, ask your doctor. Avoid things that can make it harder to breathe, such as: Smoke of all kinds. This includes smoke from campfires or forest fires. Do not smoke or allow others to smoke in your home. Mold. Dust. Air pollution. Chemical smells. Things that can give you an allergic reaction (allergens) if you have allergies. Keep your living space clean. Use products that help remove mold and dust. General instructions Watch for any changes in your symptoms. Take over-the-counter and prescription medicines only as told by your doctor. This includes oxygen therapy and inhaled medicines. Rest as needed. Return to your normal activities when your doctor says that it is safe. Keep all follow-up visits. Contact a doctor if: Your condition does not get better as soon as expected. You have a hard time doing your normal activities, even after you rest. You have new symptoms. You cannot walk up stairs. You cannot exercise the way you normally do. Get help right away if: Your shortness of breath gets worse. You have trouble breathing when you are resting. You feel light-headed or you faint. You have a cough that is not helped by medicines. You cough up blood. You have pain with breathing. You have pain in your chest, arms, shoulders, or belly (abdomen). You have a fever. These symptoms may be an emergency. Get help right away. Call 911. Do not wait to see if the symptoms will go away. Do not drive yourself to the hospital. Summary Shortness of breath is when you have trouble breathing enough air. It can be a sign of a medical problem. Avoid things that make it hard for you to breathe, such as smoking, pollution,  mold, and dust. Watch for any changes in your symptoms. Contact your doctor if you do not get better or you get worse. This information is not intended to replace advice given to you by your health care provider. Make sure you discuss any questions you have with your health care provider. Document Revised: 02/18/2021 Document Reviewed: 02/18/2021 Elsevier Patient Education  2023 Elsevier Inc.  

## 2021-12-06 NOTE — Progress Notes (Signed)
Assessment and Plan:  [name] was seen today for an episodic visit.  Diagnoses and all order for this visit:  1. Shortness of breath Discussed medication compliance. Continue Spirivia, Singulair Duoneb administered successfully and well tolerated. Suggest purchasing OTC Pulse Ox Instructed that if saturation falls below 88% contact office or report to ER.  - ipratropium-albuterol (DUONEB) 0.5-2.5 (3) MG/3ML nebulizer solution 3 mL  2. Upper respiratory tract infection, unspecified type Continue to monitor.  - azithromycin (ZITHROMAX) 250 MG tablet; Take 2 tablets (500 mg) on  Day 1,  followed by 1 tablet (250 mg) once daily on Days 2 through 5.  Dispense: 6 each; Refill: 1  3. Lung nodule Continue to follow with Pulmonology. CTA follow up 3 mo. Will continue to monitor.  4. Chills Possibly r/t underlying infection. Continue to monitor for any increase in uncontrolled fever. Notify office if s/s fail to improve.  - azithromycin (ZITHROMAX) 250 MG tablet; Take 2 tablets (500 mg) on  Day 1,  followed by 1 tablet (250 mg) once daily on Days 2 through 5.  Dispense: 6 each; Refill: 1  5. Seasonal allergic rhinitis due to pollen Continue Zyrtec. Avoid triggers.  6. Arthralgia, unspecified joint Stay well hydrated. Discussed cinnamon, tumeric, glucosamine supplements. Meloxicam PRN Rest  - meloxicam (MOBIC) 7.5 MG tablet; Take 1 tablet (7.5 mg total) by mouth daily PRN for arthritis pain.  Dispense: 30 tablet; Refill: 2  7. B12 deficiency Continue supplement.  - B12 and Folate Panel  8. Other fatigue Rest.  - B12 and Folate Panel  Notify office for further evaluation and treatment, questions or concerns if s/s fail to improve. The risks and benefits of my recommendations, as well as other treatment options were discussed with the patient today. Questions were answered.  Further disposition pending results of labs. Discussed med's effects and SE's.    Over 20  minutes of exam, counseling, chart review, and critical decision making was performed.   Future Appointments  Date Time Provider [place]  12/06/2021  9:30 AM Darrol Jump, NP [facility] None  01/29/2022 11:30 AM Darrol Jump, NP [facility] None  05/07/2022  3:00 PM Unk Pinto, MD [facility] None    ------------------------------------------------------------------------------------------------------------------   HPI BP (!) 145/78   Pulse 65   Temp (!) 96.9 F (36.1 C)   Wt 162 lb 9.6 oz (73.8 kg)   SpO2 95%   BMI 27.06 kg/m    [name] is a 71 y.o. female who presents for evaluation of symptoms of a URI. Symptoms include congestion, cough described as productive, and chills . Onset of symptoms was 1  month  ago, and has been unchanged since that time.  She has been taking Zyrtec and Benadryl without effectiveness. Treatment to date: antibiotics, decongestants, and antihistamines .  She has not been taking her Singular or using her Spiriva inhaler.  She recently followed with Pulmonology, Dr. Lamonte Sakai and had updated Lung CT Scan.  Lung-RADS 4A, suspicious.  She is to follow up with a low-dose chest CT without contrast in 3 months.  Pulmonary nodules measure up to 7.0 mm in the posterior segment right upper lobe.  She is also complaining of increase in fatigue and joint pain.  She has been taking 400-600 mg IBU PRN with some effectiveness.  She used to take Mobic and has some in the home but has not yet tried.  She is trying to stay well hydrated.    Past Medical History:  Diagnosis Date  Allergy    enviromental   Anxiety    Arthritis    osteoarthritis   COPD (chronic obstructive pulmonary disease) (HCC)    mild   Diabetes mellitus without complication ([place])    controlled by diet   Environmental allergies    GERD (gastroesophageal reflux disease)    Hepatitis    hepatitis A   History of colon polyps    Hyperlipidemia    Hypertension    IBS  (irritable bowel syndrome)    Mixed hyperlipidemia 06/15/2013   PONV (postoperative nausea and vomiting)    second surgery no problems last surgery   Solitary pulmonary nodule 06/02/2019   Urgency of urination    Takes oxybutynin; under control at this time     Allergies  Allergen Reactions   Enalapril Swelling    Caused lips to swell and bloating.   Statins     Tingling with several statins   Repatha [Evolocumab]     Stated she didn't tolerate it and switched to Praluent    Current Outpatient Medications on File Prior to Visit  Medication Sig   Alirocumab (PRALUENT) 150 MG/ML SOAJ Inject 150 mg into the skin every 14 (fourteen) days.   Ascorbic Acid (VITAMIN C) 1000 MG tablet Take 1,000 mg by mouth daily.   aspirin 81 MG tablet Take 81 mg by mouth every other day.    bisoprolol-hydrochlorothiazide (ZIAC) 10-6.25 MG tablet TAKE 1/2 TABLET BY MOUTH DAILY   buPROPion (WELLBUTRIN XL) 150 MG 24 hr tablet Take 1 tablet  Daily for  Mood,  Focus & Concentration   Cholecalciferol (VITAMIN D3) 5000 UNITS TABS Take 5,000 Units by mouth daily.   clonazePAM (KLONOPIN) 0.5 MG tablet Take 1/2 - 1 tablet 1 to 2  x /day ONLY if needed for Anxiety Attack &  limit to 5 days /week to avoid Addiction & Dementia   Cyanocobalamin (VITAMIN B12 PO) Take 1,000 mcg by mouth daily.   fluticasone (FLONASE) 50 MCG/ACT nasal spray 1 Spray each nostril daily   losartan (COZAAR) 100 MG tablet TAKE 1 TABLET BY MOUTH DAILY FOR BLOOD PRESSURE   methylcellulose oral powder Take by mouth daily.    montelukast (SINGULAIR) 10 MG tablet TAKE 1 TABLET(10 MG) BY MOUTH DAILY   Multiple Vitamins-Minerals (MULTI COMPLETE PO) Take 1 capsule by mouth daily.   NIFEdipine (PROCARDIA XL/NIFEDICAL XL) 60 MG 24 hr tablet Take 1 tablet (60 mg total) by mouth daily.   Omega-3 Fatty Acids (FISH OIL) 1000 MG CAPS Take 1 capsule (1,000 mg total) by mouth 2 (two) times daily with a meal.   omeprazole (PRILOSEC) 40 MG capsule Take  1  capsule  2 x /day (every 12 hours) to Prevent Heartburn & Indigestion / Patient knows to take by mouth   PEG-KCl-NaCl-NaSulf-Na Asc-C (PLENVU) 140 g SOLR Use as directed for colonoscopy prep.VPX:[id number] SWN:IOEV [id number]:[id number]   Tiotropium Bromide Monohydrate (SPIRIVA RESPIMAT IN) Inhale 1 puff into the lungs 2 (two) times daily.   Zinc 50 MG TABS Take by mouth daily.   clopidogrel (PLAVIX) 75 MG tablet Take  1 tablet  Daily  to Prevent Blood Clots (Patient not taking: Reported on 12/06/2021)   No current facility-administered medications on file prior to visit.    ROS: all negative except what is noted in the HPI.   Physical Exam:  BP (!) 145/78   Pulse 65   Temp (!) 96.9 F (36.1 C)   Wt 162 lb 9.6 oz (73.8 kg)   SpO2  95%   BMI 27.06 kg/m   General Appearance: NAD.  Awake, conversant and cooperative. Eyes: PERRLA, EOMs intact.  Sclera white.  Conjunctiva without erythema. Sinuses: No frontal/maxillary tenderness.  No nasal discharge. Nares patent.  ENT/Mouth: Ext aud canals clear.  Bilateral TMs w/DOL and without erythema or bulging. Hearing intact.  Posterior pharynx without swelling or exudate.  Tonsils without swelling or erythema.  Neck: Supple.  No masses, nodules or thyromegaly. Respiratory: Mild wheezing right upper posterior lobe upon expiration. Effort is regular with non-labored breathing. Breath sounds are equal bilaterally without rales, rhonchi, wheezing or stridor.  Cardio: RRR with no MRGs. Brisk peripheral pulses without edema.  Abdomen: Active BS in all four quadrants.  Soft and non-tender without guarding, rebound tenderness, hernias or masses. Lymphatics: Non tender without lymphadenopathy.  Musculoskeletal: Full ROM, 5/5 strength, normal ambulation.  No clubbing or cyanosis. Skin: Appropriate color for ethnicity. Warm without rashes, lesions, ecchymosis, ulcers.  Neuro: CN II-XII grossly normal. Normal muscle tone without cerebellar symptoms and  intact sensation.   Psych: AO X 3,  appropriate mood and affect, insight and judgment.     Darrol Jump, NP 9:23 AM [facility]

## 2021-12-07 ENCOUNTER — Encounter: Payer: Self-pay | Admitting: Nurse Practitioner

## 2021-12-07 LAB — B12 AND FOLATE PANEL
Folate: >24 ng/mL
Vitamin B-12: 1201 pg/mL — ABNORMAL HIGH (ref 200–1100)

## 2021-12-20 ENCOUNTER — Ambulatory Visit: Payer: Medicare Other | Admitting: Nurse Practitioner

## 2021-12-25 ENCOUNTER — Telehealth: Payer: Self-pay | Admitting: Emergency Medicine

## 2021-12-25 NOTE — Telephone Encounter (Signed)
Patient requested a refill of Spiriva- she has never gotten a script, only samples. Patient has not been seen in 2 years so appointment was scheduled tomorrow 6/13 at 10:15am. Patient would like a script called in today- Informed patient I was unsure if they would be able to call in anything due to not being seen in 2 years.  Please advise- Uses [facility] on [address].

## 2021-12-26 ENCOUNTER — Ambulatory Visit: Payer: Medicare Other | Admitting: Emergency Medicine

## 2021-12-26 ENCOUNTER — Other Ambulatory Visit: Payer: Self-pay | Admitting: Cardiology

## 2021-12-26 DIAGNOSIS — I10 Essential (primary) hypertension: Secondary | ICD-10-CM

## 2021-12-26 NOTE — Telephone Encounter (Signed)
Pt will need to wait until her appt which is scheduled with [facility] 6/14. Attempted to call pt to let her know this info but unable to reach. Left pt a detailed message letting her know that the refill would need to wait until she came to the office for her appt. Nothing further needed.

## 2021-12-27 ENCOUNTER — Ambulatory Visit (INDEPENDENT_AMBULATORY_CARE_PROVIDER_SITE_OTHER): Payer: Medicare Other | Admitting: Nurse Practitioner

## 2021-12-27 ENCOUNTER — Encounter: Payer: Self-pay | Admitting: Nurse Practitioner

## 2021-12-27 VITALS — BP 172/68 | HR 57 | Temp 98.3°F | Ht 65.0 in | Wt 161.6 lb

## 2021-12-27 DIAGNOSIS — F17210 Nicotine dependence, cigarettes, uncomplicated: Secondary | ICD-10-CM

## 2021-12-27 DIAGNOSIS — J432 Centrilobular emphysema: Secondary | ICD-10-CM | POA: Diagnosis not present

## 2021-12-27 DIAGNOSIS — R0609 Other forms of dyspnea: Secondary | ICD-10-CM

## 2021-12-27 DIAGNOSIS — I10 Essential (primary) hypertension: Secondary | ICD-10-CM | POA: Diagnosis not present

## 2021-12-27 DIAGNOSIS — Z720 Tobacco use: Secondary | ICD-10-CM | POA: Diagnosis not present

## 2021-12-27 DIAGNOSIS — R918 Other nonspecific abnormal finding of lung field: Secondary | ICD-10-CM

## 2021-12-27 DIAGNOSIS — F17200 Nicotine dependence, unspecified, uncomplicated: Secondary | ICD-10-CM

## 2021-12-27 MED ORDER — STIOLTO RESPIMAT 2.5-2.5 MCG/ACT IN AERS: 2.0000 | INHALATION_SPRAY | Freq: Every day | RESPIRATORY_TRACT | 5 refills | Status: DC

## 2021-12-27 MED ORDER — ALBUTEROL SULFATE HFA 108 (90 BASE) MCG/ACT IN AERS: 2.0000 | INHALATION_SPRAY | Freq: Four times a day (QID) | RESPIRATORY_TRACT | 2 refills | Status: AC | PRN

## 2021-12-27 MED ORDER — STIOLTO RESPIMAT 2.5-2.5 MCG/ACT IN AERS: 2.0000 | INHALATION_SPRAY | Freq: Every day | RESPIRATORY_TRACT | 0 refills | Status: DC

## 2021-12-27 MED ORDER — BUPROPION HCL ER (SR) 150 MG PO TB12: 150.0000 mg | ORAL_TABLET | Freq: Two times a day (BID) | ORAL | 3 refills | Status: DC

## 2021-12-27 NOTE — Assessment & Plan Note (Addendum)
Progressive DOE; likely related to smoking again and being off maintenance regimen. Given worsening from baseline, will plan to repeat PFTs. Start on Edgar for maintenance - provided with samples today. Refill for albuterol rescue sent.   Patient Instructions  Start Stiolto 2 puffs daily. This is your new maintenance inhaler Albuterol inhaler 2 puffs or 3 mL neb every 6 hours as needed for shortness of breath or wheezing. Notify if symptoms persist despite rescue inhaler/neb use. Change Wellbutrin to 150 mg Twice daily for a minimum of 12 weeks. We will readdress at this point if you have quit smoking. Monitor your mood and notify of any changes.   CT chest anticipated in July - someone should contact you for scheduling this  Pulmonary function testing scheduled today  Follow up with Dr. Lamonte Sakai after PFTs. If symptoms do not improve or worsen, please contact office for sooner follow up or seek emergency care.

## 2021-12-27 NOTE — Assessment & Plan Note (Signed)
Hypertensive with SBP in the 170's upon arrival. Recheck after resting with SBP improved to 150's. She was advised to monitor at home and notify her PCP if she was maintaining above 140/90 despite antihypertensive regimen.

## 2021-12-27 NOTE — Progress Notes (Addendum)
$'@Patient'b$  ID: [name], female    DOB: 01-Sep-1950, 71 y.o.   MRN: [id number]  Chief Complaint  Patient presents with   Follow-up    Follow up. Patient has no complaints.     Referring provider: Unk Pinto, MD  HPI: 71 year old female, current smoker followed for emphysema and chronic cough. She is a patient of Dr. Agustina Caroli and last seen in office on 01/05/2022. Past medical history significant for HTN, CAD, GERD, HLD, depression.   TEST/EVENTS:  12/11/2017 PFTs: FVC 102, FEV1 95, ratio 79, TLC 125, DLCOunc 71. Normal spirometry with increased lung volumes and diffusion defect.   01/05/2022: OV with Dr. Lamonte Sakai. Recently quit smoking using Wellbutrin and nicotine patches. Wanting to try off Spiriva now that she has stopped smoking. Trial stop for 2-3 weeks. Continue singulair for allergic rhinitis and flonase as needed.   12/27/2021: Today - follow up Patient presents today for overdue follow up. Since she was seen last, she started smoking again. Her husband has been diagnosed with lung cancer and newly found liver mets, which has put a lot of stress on her. She does want to quit again and felt like taking Wellbutrin twice a day helped her more than the once daily dosing she is on now. She does feel like her breathing has gotten worse over the past few months, even when compared to before she started on Spiriva previously. She would like to go back on a maintenance regimen. She feels like her activity tolerance is poor and she gets winded with long distances and climbing. She has a daily cough, minimally productive with clear to white sputum. She had a recent LDCT for lung cancer screening, Lung RADS 4A, and anticipated to undergo repeat scan in July. She denies hemoptysis, weight loss, anorexia, wheezing, lower extremity edema. She does not use her albuterol frequently. Currently on Wellbutrin 150 mg daily and feels like this helps her mood but hasn't helped with her cravings. Stopped  her singulair; allergies have been well controlled.   Allergies  Allergen Reactions   Enalapril Swelling    Caused lips to swell and bloating.   Statins     Tingling with several statins   Repatha [Evolocumab]     Stated she didn't tolerate it and switched to [employer] History  Administered Date(s) Administered   Influenza, High Dose Seasonal PF 04/10/2017, 05/08/2019, 05/24/2020, 04/24/2021   Influenza-Unspecified 03/05/2018   PFIZER(Purple Top)SARS-COV-2 Vaccination 08/13/2019, 09/09/2019   PPD Test 09/21/2014   Pneumococcal Conjugate-13 10/22/2017   Pneumococcal Polysaccharide-23 02/07/2011, 01/26/2016   Tdap 02/07/2011    Past Medical History:  Diagnosis Date   Allergy    enviromental   Anxiety    Arthritis    osteoarthritis   COPD (chronic obstructive pulmonary disease) (HCC)    mild   Diabetes mellitus without complication ([place])    controlled by diet   Environmental allergies    GERD (gastroesophageal reflux disease)    Hepatitis    hepatitis A   History of colon polyps    Hyperlipidemia    Hypertension    IBS (irritable bowel syndrome)    Mixed hyperlipidemia 06/15/2013   PONV (postoperative nausea and vomiting)    second surgery no problems last surgery   Solitary pulmonary nodule 06/02/2019   Urgency of urination    Takes oxybutynin; under control at this time    Tobacco History: Social History   Tobacco Use  Smoking Status Light Smoker  Packs/day: 1.00   Years: 35.00   Total pack years: 35.00   Types: Cigarettes   Start date: 38   Last attempt to quit: 11/02/2019   Years since quitting: 2.1  Smokeless Tobacco Never  Tobacco Comments   Quit 10/05/2019    Ready to quit: Not Answered Counseling given: Not Answered Tobacco comments: Quit 10/05/2019    Outpatient Medications Prior to Visit  Medication Sig Dispense Refill   Alirocumab (PRALUENT) 150 MG/ML SOAJ Inject 150 mg into the skin every 14 (fourteen) days. 2 mL 11    Ascorbic Acid (VITAMIN C) 1000 MG tablet Take 1,000 mg by mouth daily.     aspirin 81 MG tablet Take 81 mg by mouth every other day.      bisoprolol-hydrochlorothiazide (ZIAC) 10-6.25 MG tablet TAKE 1/2 TABLET BY MOUTH DAILY 45 tablet 3   Cholecalciferol (VITAMIN D3) 5000 UNITS TABS Take 5,000 Units by mouth daily.     clonazePAM (KLONOPIN) 0.5 MG tablet Take 1/2 - 1 tablet 1 to 2  x /day ONLY if needed for Anxiety Attack &  limit to 5 days /week to avoid Addiction & Dementia 90 tablet 0   Cyanocobalamin (VITAMIN B12 PO) Take 1,000 mcg by mouth daily.     fluticasone (FLONASE) 50 MCG/ACT nasal spray 1 Spray each nostril daily 48 g 1   losartan (COZAAR) 100 MG tablet TAKE 1 TABLET BY MOUTH DAILY FOR BLOOD PRESSURE 90 tablet 3   meloxicam (MOBIC) 7.5 MG tablet Take 1 tablet (7.5 mg total) by mouth daily PRN for arthritis pain. 30 tablet 2   methylcellulose oral powder Take by mouth daily.      montelukast (SINGULAIR) 10 MG tablet TAKE 1 TABLET(10 MG) BY MOUTH DAILY 90 tablet 3   Multiple Vitamins-Minerals (MULTI COMPLETE PO) Take 1 capsule by mouth daily.     NIFEdipine (PROCARDIA XL/NIFEDICAL XL) 60 MG 24 hr tablet Take 1 tablet (60 mg total) by mouth daily. 120 tablet 0   Omega-3 Fatty Acids (FISH OIL) 1000 MG CAPS Take 1 capsule (1,000 mg total) by mouth 2 (two) times daily with a meal. 60 capsule 11   omeprazole (PRILOSEC) 40 MG capsule Take  1 capsule  2 x /day (every 12 hours) to Prevent Heartburn & Indigestion / Patient knows to take by mouth 180 capsule 3   PEG-KCl-NaCl-NaSulf-Na Asc-C (PLENVU) 140 g SOLR Use as directed for colonoscopy prep.ZOX:[id number] WUJ:WJXB JYNWG:[id number] 1 each 0   Zinc 50 MG TABS Take by mouth daily.     buPROPion (WELLBUTRIN XL) 150 MG 24 hr tablet Take 1 tablet  Daily for  Mood,  Focus & Concentration 90 tablet 3   Tiotropium Bromide Monohydrate (SPIRIVA RESPIMAT IN) Inhale 1 puff into the lungs 2 (two) times daily.     clopidogrel (PLAVIX) 75 MG  tablet Take  1 tablet  Daily  to Prevent Blood Clots (Patient not taking: Reported on 12/06/2021) 90 tablet 0   ipratropium-albuterol (DUONEB) 0.5-2.5 (3) MG/3ML nebulizer solution 3 mL      No facility-administered medications prior to visit.     Review of Systems:   Constitutional: No weight loss or gain, night sweats, fevers, chills, fatigue, or lassitude. HEENT: No headaches, difficulty swallowing, tooth/dental problems, or sore throat. No sneezing, itching, ear ache, nasal congestion, or post nasal drip CV:  No chest pain, orthopnea, PND, swelling in lower extremities, anasarca, dizziness, palpitations, syncope Resp: +shortness of breath with exertion; chronic cough. No excess mucus or change in color  of mucus. No hemoptysis. No wheezing.  No chest wall deformity GI:  No heartburn, indigestion, abdominal pain, nausea, vomiting, diarrhea, change in bowel habits, loss of appetite, bloody stools.  Skin: No rash, lesions, ulcerations MSK:  No joint pain or swelling.  No decreased range of motion.  No back pain. Neuro: No dizziness or lightheadedness.  Psych: No depression or anxiety. Mood stable.     Physical Exam:  BP (!) 172/68 (BP Location: Right Arm, Patient Position: Sitting, Cuff Size: Normal)   Pulse (!) 57   Temp 98.3 F (36.8 C) (Oral)   Ht '5\' 5"'$  (1.651 m)   Wt 161 lb 9.6 oz (73.3 kg)   SpO2 96%   BMI 26.89 kg/m   GEN: Pleasant, interactive, well-appearing; in no acute distress. HEENT:  Normocephalic and atraumatic. EACs patent bilaterally. TM pearly gray with present light reflex bilaterally. PERRLA. Sclera white. Nasal turbinates pink, moist and patent bilaterally. No rhinorrhea present. Oropharynx pink and moist, without exudate or edema. No lesions, ulcerations, or postnasal drip.  NECK:  Supple w/ fair ROM. No JVD present. Normal carotid impulses w/o bruits. Thyroid symmetrical with no goiter or nodules palpated. No lymphadenopathy.   CV: RRR, no m/r/g, no peripheral  edema. Pulses intact, +2 bilaterally. No cyanosis, pallor or clubbing. PULMONARY:  Unlabored, regular breathing. Clear bilaterally A&P w/o wheezes/rales/rhonchi. No accessory muscle use. No dullness to percussion. GI: BS present and normoactive. Soft, non-tender to palpation. No organomegaly or masses detected. No CVA tenderness. MSK: No erythema, warmth or tenderness. Cap refil <2 sec all extrem. No deformities or joint swelling noted.  Neuro: A/Ox3. No focal deficits noted.   Skin: Warm, no lesions or rashe Psych: Normal affect and behavior. Judgement and thought content appropriate.     Lab Results:  CBC    Component Value Date/Time   WBC 5.6 10/24/2021 1119   RBC 4.99 10/24/2021 1119   HGB 13.4 10/24/2021 1119   HCT 40.3 10/24/2021 1119   PLT 293 10/24/2021 1119   MCV 80.8 10/24/2021 1119   MCH 26.9 (L) 10/24/2021 1119   MCHC 33.3 10/24/2021 1119   RDW 14.3 10/24/2021 1119   LYMPHSABS 2,122 10/24/2021 1119   MONOABS 496 12/18/2016 1225   EOSABS 140 10/24/2021 1119   BASOSABS 28 10/24/2021 1119    BMET    Component Value Date/Time   NA 144 10/24/2021 1119   K 4.2 10/24/2021 1119   CL 107 10/24/2021 1119   CO2 30 10/24/2021 1119   GLUCOSE 91 10/24/2021 1119   BUN 17 10/24/2021 1119   CREATININE 0.78 10/24/2021 1119   CALCIUM 9.4 10/24/2021 1119   GFRNONAA 76 09/02/2020 1027   GFRAA 89 09/02/2020 1027    BNP No results found for: "BNP"   Imaging:  No results found.       Latest Ref Rng & Units 12/11/2017   10:59 AM  PFT Results  FVC-Pre L 3.30   FVC-Predicted Pre % 102   FVC-Post L 3.12   FVC-Predicted Post % 97   Pre FEV1/FVC % % 71   Post FEV1/FCV % % 79   FEV1-Pre L 2.34   FEV1-Predicted Pre % 95   FEV1-Post L 2.45   DLCO uncorrected ml/min/mmHg 18.39   DLCO UNC% % 71   DLVA Predicted % 74   TLC L 6.50   TLC % Predicted % 125   RV % Predicted % 127     No results found for: "NITRICOXIDE"      Assessment &  Plan:   Centrilobular  emphysema ([place]) Progressive DOE; likely related to smoking again and being off maintenance regimen. Given worsening from baseline, will plan to repeat PFTs. Start on [place] for maintenance - provided with samples today. Refill for albuterol rescue sent.   Patient Instructions  Start Stiolto 2 puffs daily. This is your new maintenance inhaler Albuterol inhaler 2 puffs or 3 mL neb every 6 hours as needed for shortness of breath or wheezing. Notify if symptoms persist despite rescue inhaler/neb use. Change Wellbutrin to 150 mg Twice daily for a minimum of 12 weeks. We will readdress at this point if you have quit smoking. Monitor your mood and notify of any changes.   CT chest anticipated in July - someone should contact you for scheduling this  Pulmonary function testing scheduled today  Follow up with Dr. Lamonte Sakai after PFTs. If symptoms do not improve or worsen, please contact office for sooner follow up or seek emergency care.     Tobacco abuse Smoking cessation discussed for approximately 5 min. Increased Wellbutrin to 150 mg Twice daily as this helped her quit previously. She has tolerated well in the past but educated on potential worsening depression/mood changes with increased dosage. Advised her to notify if she would also like to use nicotine patches again and we can send rx for these.   Lung nodules LDCT from April with lung RADS 4A and suspicious nodules. Plans to repeat in the upcoming weeks. This has not been scheduled so message sent to RN coordinator today. She has had progressive DOE but no other red flag symptoms.   Essential hypertension Hypertensive with SBP in the 170's upon arrival. Recheck after resting with SBP improved to 150's. She was advised to monitor at home and notify her PCP if she was maintaining above 140/90 despite antihypertensive regimen.    I spent 35 minutes of dedicated to the care of this patient on the date of this encounter to include pre-visit review  of records, face-to-face time with the patient discussing conditions above, post visit ordering of testing, clinical documentation with the electronic health record, making appropriate referrals as documented, and communicating necessary findings to members of the patients care team.  Clayton Bibles, NP 12/27/2021  Pt aware and understands NP's role.

## 2021-12-27 NOTE — Assessment & Plan Note (Addendum)
Smoking cessation discussed for approximately 5 min. Increased Wellbutrin to 150 mg Twice daily as this helped her quit previously. She has tolerated well in the past but educated on potential worsening depression/mood changes with increased dosage. Advised her to notify if she would also like to use nicotine patches again and we can send rx for these.

## 2021-12-27 NOTE — Patient Instructions (Addendum)
Start Stiolto 2 puffs daily. This is your new maintenance inhaler Albuterol inhaler 2 puffs or 3 mL neb every 6 hours as needed for shortness of breath or wheezing. Notify if symptoms persist despite rescue inhaler/neb use. Change Wellbutrin to 150 mg Twice daily for a minimum of 12 weeks. We will readdress at this point if you have quit smoking. Monitor your mood and notify of any changes.   CT chest anticipated in July - someone should contact you for scheduling this  Pulmonary function testing scheduled today  Follow up with Dr. Lamonte Sakai after PFTs. If symptoms do not improve or worsen, please contact office for sooner follow up or seek emergency care.

## 2021-12-27 NOTE — Assessment & Plan Note (Signed)
LDCT from April with lung RADS 4A and suspicious nodules. Plans to repeat in the upcoming weeks. This has not been scheduled so message sent to RN coordinator today. She has had progressive DOE but no other red flag symptoms.

## 2021-12-28 ENCOUNTER — Other Ambulatory Visit: Payer: Self-pay | Admitting: Internal Medicine

## 2021-12-28 DIAGNOSIS — F5101 Primary insomnia: Secondary | ICD-10-CM

## 2022-01-17 ENCOUNTER — Other Ambulatory Visit: Payer: Medicare Other

## 2022-01-17 ENCOUNTER — Ambulatory Visit
Admission: RE | Admit: 2022-01-17 | Discharge: 2022-01-17 | Disposition: A | Discharge status: Home / Self Care | Payer: Medicare Other | Source: Ambulatory Visit | Attending: Acute Care | Admitting: Acute Care

## 2022-01-17 DIAGNOSIS — Z122 Encounter for screening for malignant neoplasm of respiratory organs: Secondary | ICD-10-CM

## 2022-01-17 DIAGNOSIS — R918 Other nonspecific abnormal finding of lung field: Secondary | ICD-10-CM | POA: Diagnosis not present

## 2022-01-17 DIAGNOSIS — I3139 Other pericardial effusion (noninflammatory): Secondary | ICD-10-CM | POA: Diagnosis not present

## 2022-01-17 DIAGNOSIS — R911 Solitary pulmonary nodule: Secondary | ICD-10-CM | POA: Diagnosis not present

## 2022-01-17 DIAGNOSIS — J439 Emphysema, unspecified: Secondary | ICD-10-CM | POA: Diagnosis not present

## 2022-01-17 DIAGNOSIS — Z87891 Personal history of nicotine dependence: Secondary | ICD-10-CM

## 2022-01-17 DIAGNOSIS — F17200 Nicotine dependence, unspecified, uncomplicated: Secondary | ICD-10-CM

## 2022-01-18 ENCOUNTER — Other Ambulatory Visit: Payer: Self-pay | Admitting: Acute Care

## 2022-01-18 DIAGNOSIS — Z87891 Personal history of nicotine dependence: Secondary | ICD-10-CM

## 2022-01-18 DIAGNOSIS — F17210 Nicotine dependence, cigarettes, uncomplicated: Secondary | ICD-10-CM

## 2022-01-18 DIAGNOSIS — Z122 Encounter for screening for malignant neoplasm of respiratory organs: Secondary | ICD-10-CM

## 2022-01-22 ENCOUNTER — Telehealth: Payer: Self-pay | Admitting: Emergency Medicine

## 2022-01-22 NOTE — Telephone Encounter (Signed)
Called patient and she is requesting her CT Chest results   Please advise sir

## 2022-01-23 NOTE — Telephone Encounter (Signed)
Please let her know that the nodules that were new on her prior exam are all smaller or resolved.  This is good news.  She will need a standard repeat lung cancer screening CT scan of the chest in 12 months.

## 2022-01-23 NOTE — Telephone Encounter (Signed)
Called and spoke with pt letting her know the results of the CT and she verbalized understanding. Nothing further needed. 

## 2022-01-29 ENCOUNTER — Ambulatory Visit (INDEPENDENT_AMBULATORY_CARE_PROVIDER_SITE_OTHER): Payer: Medicare Other | Admitting: Nurse Practitioner

## 2022-01-29 ENCOUNTER — Telehealth: Payer: Self-pay

## 2022-01-29 ENCOUNTER — Encounter: Payer: Self-pay | Admitting: Nurse Practitioner

## 2022-01-29 VITALS — BP 136/72 | HR 64 | Temp 97.2°F | Ht 65.0 in | Wt 161.0 lb

## 2022-01-29 DIAGNOSIS — R918 Other nonspecific abnormal finding of lung field: Secondary | ICD-10-CM | POA: Diagnosis not present

## 2022-01-29 DIAGNOSIS — J432 Centrilobular emphysema: Secondary | ICD-10-CM | POA: Diagnosis not present

## 2022-01-29 DIAGNOSIS — G4733 Obstructive sleep apnea (adult) (pediatric): Secondary | ICD-10-CM

## 2022-01-29 DIAGNOSIS — E559 Vitamin D deficiency, unspecified: Secondary | ICD-10-CM

## 2022-01-29 DIAGNOSIS — K219 Gastro-esophageal reflux disease without esophagitis: Secondary | ICD-10-CM

## 2022-01-29 DIAGNOSIS — I700 Atherosclerosis of aorta: Secondary | ICD-10-CM

## 2022-01-29 DIAGNOSIS — Z0000 Encounter for general adult medical examination without abnormal findings: Secondary | ICD-10-CM

## 2022-01-29 DIAGNOSIS — E782 Mixed hyperlipidemia: Secondary | ICD-10-CM

## 2022-01-29 DIAGNOSIS — F419 Anxiety disorder, unspecified: Secondary | ICD-10-CM

## 2022-01-29 DIAGNOSIS — I10 Essential (primary) hypertension: Secondary | ICD-10-CM

## 2022-01-29 DIAGNOSIS — Z79899 Other long term (current) drug therapy: Secondary | ICD-10-CM

## 2022-01-29 DIAGNOSIS — F330 Major depressive disorder, recurrent, mild: Secondary | ICD-10-CM

## 2022-01-29 NOTE — Telephone Encounter (Signed)
-----   Message from Darrol Jump, NP sent at 01/29/2022 12:31 PM EDT ----- Regarding: Lung CT Screen When reviewing the note from Pulmonology it mentioned that someone from their office would set up a Chest Ct f/u  in 01/2022 for new lung nodules. During her visit, I failed to confirm if this was scheduled.  Can we check to see if she has a Chest CT scheduled for 01/2022 and if not either have her reach out to Pulmonology to get this scheduled.  If now, I will need to schedule.  Thanks

## 2022-01-29 NOTE — Telephone Encounter (Signed)
Patient has upcoming appointment on 7/19 with pulmonology.

## 2022-01-29 NOTE — Progress Notes (Signed)
MEDICARE ANNUAL WELLNESS VISIT AND FOLLOW UP  Assessment:   Diagnoses and all orders for this visit:  1. Encounter for Medicare annual wellness exam Due Annually  2. Essential hypertension Controlled  Continue medications;  Discussed DASH (Dietary Approaches to Stop Hypertension) DASH diet is lower in sodium than a typical American diet. Cut back on foods that are high in saturated fat, cholesterol, and trans fats. Eat more whole-grain foods, fish, poultry, and nuts Remain active and exercise as tolerated daily.  Monitor BP at home-Call if greater than 130/80.    3. Aortic atherosclerosis by Abd CT scan in 2018  [facility]) Per CT 05/2019 Discussed lifestyle modifications. Recommended diet heavy in fruits and veggies, omega 3's. Decrease consumption of animal meats, cheeses, and dairy products. Remain active and exercise as tolerated. Continue to monitor.   4. Centrilobular emphysema ([place]) Continues to smoke Now on Wellbutrin. Continue to follow with Pulmonology Last seen 12/27/21 She had a recent LDCT for lung cancer screening, Lung RADS 4A, 10/2021. Due again 12/2021 d/t nodules. Smoking cessation instruction/counseling given:  counseled patient on the dangers of tobacco use, advised patient to stop smoking, and reviewed strategies to maximize success   5. OSA (obstructive sleep apnea) Does not use CPAP - unable to tolerate. Increase risk for untreated OSA discussed. Continue to follow up with Pulmonology Continue to monitor  6. Lung nodules Lung-RADS 4A, suspicious. Follow up low-dose chest CT without contrast in 3 months (July 2023) is recommended. Alternatively, PET may be considered when there is a solid component 8m or larger.  Pulmonary nodules measure up to 7.0 mm in the posterior segment right upper lobe. Reach out to Pulmonology as someone should contact you regarding scheduling this.    7. Gastroesophageal reflux disease, unspecified whether esophagitis  present Controlled  No suspected reflux complications ([place]/stricture). Lifestyle modification:  wt loss, avoid meals 2-3h before bedtime. Consider eliminating food triggers:  chocolate, caffeine, EtOH, acid/spicy food.   8. Hyperlipidemia, mixed Controlled Continue medications;  Discussed lifestyle modifications. Recommended diet heavy in fruits and veggies, omega 3's. Decrease consumption of animal meats, cheeses, and dairy products. Remain active and exercise as tolerated. Continue to monitor.   9. Vitamin D deficiency Controlled. Continue supplement  10. Recurrent mild major depressive disorder with anxiety ([place] Controlled. Continue Wellbutrin, Klonopin PRN, rare use of Benzo. Continue to monitor   11. Medication management All medications discussed and reviewed in full. All questions and concerns regarding medications addressed.      Over 30 minutes of exam, counseling, chart review and critical decision making was performed Future Appointments  Date Time Provider [place] 01/31/2022  4:15 PM BCollene Gobble MD [facility] None  05/07/2022  3:00 PM MUnk Pinto MD [facility] None  11/07/2022 11:00 AM CDarrol Jump NP [facility] None     Plan:   During the course of the visit the patient was educated and counseled about appropriate screening and preventive services including:   Pneumococcal vaccine  Prevnar 13 Influenza vaccine Td vaccine Screening electrocardiogram Bone densitometry screening Colorectal cancer screening Diabetes screening Glaucoma screening Nutrition counseling  Advanced directives: requested   Subjective:  [name] is a 71y.o. female who presents for Medicare Annual Wellness Visit and 3 month follow up.   She is married and lives with her husband who has recent underwent more health issues.  This has caused her to start back smoking.  She ws recently placed on Wellbutrin 300 mg--reports so far this is  helping.  She had a updated Lung CT scan 10/2021 which revealed new lung nodules.  It was suggested that she f/u with a re-screen in 3 months, 01/2022.  She had sleep study in 2019 and newly diagnosed OSA.  She is unable to tolerate the CPAP, will follow up with [facility].   she has a diagnosis of anxiety and is currently on clonazepam 0.25 - 0.5 mg, along with Wellbutrin for smoking cessation.  Reports symptoms are fairly controlled on current regimen although she is currently more stressed d/t her husbands declining health.   she has a diagnosis of GERD which is currently managed by omeprazole 40 mg daily, carafate PRN; had recent EGD in 04/2019 which was unremarkable excepting inflammation.   she reports symptoms is currently well controlled, and denies breakthrough reflux, burning in chest; she does have multifactorial chronic hoarseness and smoker's cough.   BMI is Body mass index is 26.79 kg/m., she has been working on diet and exercise, exercise limited by pandemic, [facility]  Wt Readings from Last 3 Encounters:  01/29/22 161 lb (73 kg)  12/27/21 161 lb 9.6 oz (73.3 kg)  12/06/21 162 lb 9.6 oz (73.8 kg)   She follows with Dr. Stanford Breed; Carotid Dopplers July 2013 showed less than 50% bilateral stenosis. Echocardiogram September 2018 showed normal LV function, trace aortic insufficiency and mild left atrial enlargement. Stress echocardiogram October 2018 showed no stress-induced wall motion abnormalities. She has CAD and aortic atherosclerosis by CT 05/2019.    Her blood pressure has been controlled at home, today their BP is BP: 136/72 She does not workout. She denies chest pain, shortness of breath, dizziness.   She is on cholesterol medication (she is on praluent injectible via cardiology, had allergic reaction repatha) and denies myalgias. Her cholesterol is at goal. The cholesterol last visit was:   Lab Results  Component Value Date   CHOL 172  10/24/2021   HDL 86 10/24/2021   LDLCALC 67 10/24/2021   TRIG 106 10/24/2021   CHOLHDL 2.0 10/24/2021    She has been working on diet and exercise for glucose management, and denies foot ulcerations, increased appetite, nausea, paresthesia of the feet, polydipsia, polyuria, visual disturbances, vomiting and weight loss. Last A1C in the office was:  Lab Results  Component Value Date   HGBA1C 5.5 10/24/2021   Last GFR: Lab Results  Component Value Date   GFRNONAA 76 09/02/2020   Patient is on Vitamin D supplement and at recent check:   Lab Results  Component Value Date   VD25OH 52 10/24/2021      Medication Review: Current Outpatient Medications on File Prior to Visit  Medication Sig Dispense Refill   albuterol (VENTOLIN HFA) 108 (90 Base) MCG/ACT inhaler Inhale 2 puffs into the lungs every 6 (six) hours as needed for wheezing or shortness of breath. 8 g 2   Alirocumab (PRALUENT) 150 MG/ML SOAJ Inject 150 mg into the skin every 14 (fourteen) days. 2 mL 11   Ascorbic Acid (VITAMIN C) 1000 MG tablet Take 1,000 mg by mouth daily.     aspirin 81 MG tablet Take 81 mg by mouth every other day.      bisoprolol-hydrochlorothiazide (ZIAC) 10-6.25 MG tablet TAKE 1/2 TABLET BY MOUTH DAILY 45 tablet 3   buPROPion (WELLBUTRIN SR) 150 MG 12 hr tablet Take 1 tablet (150 mg total) by mouth 2 (two) times daily. 60 tablet 3   Cholecalciferol (VITAMIN D3) 5000 UNITS TABS Take  5,000 Units by mouth daily.     clonazePAM (KLONOPIN) 0.5 MG tablet TAKE 1/2 TO 1 TABLET BY MOUTH ONCE TO TWICE DAILY ONLY IF NEEDED FOR ANXIETY ATTACK AND LIMIT TO 5 DAYS PER WEEK TO AVOID ADDICTION AND DEMENTIA 60 tablet 0   Cyanocobalamin (VITAMIN B12 PO) Take 1,000 mcg by mouth daily.     fluticasone (FLONASE) 50 MCG/ACT nasal spray 1 Spray each nostril daily 48 g 1   losartan (COZAAR) 100 MG tablet TAKE 1 TABLET BY MOUTH DAILY FOR BLOOD PRESSURE 90 tablet 3   meloxicam (MOBIC) 7.5 MG tablet Take 1 tablet (7.5 mg total) by  mouth daily PRN for arthritis pain. 30 tablet 2   methylcellulose oral powder Take by mouth daily.      Multiple Vitamins-Minerals (MULTI COMPLETE PO) Take 1 capsule by mouth daily.     NIFEdipine (PROCARDIA XL/NIFEDICAL XL) 60 MG 24 hr tablet Take 1 tablet (60 mg total) by mouth daily. 120 tablet 0   Omega-3 Fatty Acids (FISH OIL) 1000 MG CAPS Take 1 capsule (1,000 mg total) by mouth 2 (two) times daily with a meal. 60 capsule 11   omeprazole (PRILOSEC) 40 MG capsule Take  1 capsule  2 x /day (every 12 hours) to Prevent Heartburn & Indigestion / Patient knows to take by mouth 180 capsule 3   Tiotropium Bromide-Olodaterol (STIOLTO RESPIMAT) 2.5-2.5 MCG/ACT AERS Inhale 2 puffs into the lungs daily. 4 g 5   Zinc 50 MG TABS Take by mouth daily.     clopidogrel (PLAVIX) 75 MG tablet Take  1 tablet  Daily  to Prevent Blood Clots (Patient not taking: Reported on 12/06/2021) 90 tablet 0   montelukast (SINGULAIR) 10 MG tablet TAKE 1 TABLET(10 MG) BY MOUTH DAILY (Patient not taking: Reported on 01/29/2022) 90 tablet 3   PEG-KCl-NaCl-NaSulf-Na Asc-C (PLENVU) 140 g SOLR Use as directed for colonoscopy prep.XIP:[id number] LZJ:QBHA LPFXT:[id number] DJ:[id number] 1 each 0   Tiotropium Bromide-Olodaterol (STIOLTO RESPIMAT) 2.5-2.5 MCG/ACT AERS Inhale 2 puffs into the lungs daily. 4 g 0   No current facility-administered medications on file prior to visit.    Allergies  Allergen Reactions   Enalapril Swelling    Caused lips to swell and bloating.   Statins     Tingling with several statins   Repatha [Evolocumab]     Stated she didn't tolerate it and switched to Praluent    Current Problems (verified) Patient Active Problem List   Diagnosis Date Noted   Centrilobular emphysema ([place]) 12/27/2021   Tobacco abuse 12/27/2021   Lung nodules 12/27/2021   Palpitations 12/07/2020   3-vessel CAD 09/15/2020   Screening for ischemic heart disease 09/02/2020   FHx: heart disease 09/02/2020   Allergic rhinitis  01/06/2020   Statin Intolerance 12/12/2019   OSA (obstructive sleep apnea) 02/26/2018   COPD GOLD A-B 12/02/2017   Recurrent mild major depressive disorder with anxiety ([place]) 10/22/2017   Former smoker 10/22/2017   Aortic atherosclerosis by Abd CT scan in 2018  ([facility]) 05/16/2017   GERD (gastroesophageal reflux disease) 04/10/2017   Other abnormal glucose 03/20/2014   Essential hypertension 06/15/2013   Hyperlipidemia, mixed 06/15/2013   Vitamin D deficiency 06/15/2013    Screening Tests Immunization History  Administered Date(s) Administered   Influenza, High Dose Seasonal PF 04/10/2017, 05/08/2019, 05/24/2020, 04/24/2021   Influenza-Unspecified 03/05/2018   PFIZER(Purple Top)SARS-COV-2 Vaccination 08/13/2019, 09/09/2019   PPD Test 09/21/2014   Pneumococcal Conjugate-13 10/22/2017   Pneumococcal Polysaccharide-23 02/07/2011, 01/26/2016   Tdap 02/07/2011  Preventative care: Last colonoscopy: 2015, 10 year follow up EGD: 2020 Mammogram:  10/2020, at Obgyn, Due Pelvic exam: at [facility], never abnormal, [facility], last 10/2020 Dexa: obgyn has been handling, had 10/2018 normal per patient   CT chest smoker: 10/2021 - Due 01/2022 for presence of new lung nodules  Prior vaccinations: TD or Tdap: 2012  Influenza: 04/2019  Pneumococcal: 2012, 2017 Prevnar13: 2019 Shingrix: declines due to cost   Names of Other Physician/Practitioners you currently use: 1. [facility] here for primary care 2. [facility], eye doctor, last visit 2020, has mild R cataract 3. Dr. Purcell [facility], dentist, last visit 2019, goes q58m Patient Care Team: MUnk Pinto MD as PCP - General (Internal Medicine) CLelon Perla MD as Consulting Physician (Cardiology)  SURGICAL HISTORY She  has a past surgical history that includes Appendectomy; Partial hysterectomy; Lumbar fusion (02/06/2012); Back surgery; Dilation and curettage of uterus; Total hip  arthroplasty (06/02/2012); Total knee arthroplasty (Right, 12/15/2012); Total hip arthroplasty (Left, 01/25/2016); Total hip arthroplasty (Left, 01/25/2016); Total knee arthroplasty (Left, 06/18/2016); Colonoscopy; Upper gastrointestinal endoscopy (05/06/2019); Polypectomy; and Cataract extraction (Left, 2019). FAMILY HISTORY Her family history includes CAD in her father; Cancer in her maternal grandmother; Colon cancer (age of onset: 52 in her sister; Colon cancer (age of onset: 713 in her mother; Diabetes in her mother; Hypertension in her brother, brother, and father; Lung cancer (age of onset: 540 in her brother; Peripheral Artery Disease in her paternal grandfather. SOCIAL HISTORY She  reports that she has been smoking cigarettes. She started smoking about 38 years ago. She has a 35.00 pack-year smoking history. She has never used smokeless tobacco. She reports current alcohol use. She reports current drug use. Drug: Marijuana.   MEDICARE WELLNESS OBJECTIVES: Physical activity: Current Exercise Habits: Home exercise routine, Type of exercise: Other - see comments, Time (Minutes): 20, Frequency (Times/Week): 3 (Walking), Weekly Exercise (Minutes/Week): 60, Exercise limited by: respiratory conditions(s) Cardiac risk factors:   Depression/mood screen:      01/29/2022   12:30 PM  Depression screen PHQ 2/9  Decreased Interest 0  Down, Depressed, Hopeless 0  PHQ - 2 Score 0    ADLs:     01/29/2022   12:29 PM 04/23/2021   11:57 PM  In your present state of health, do you have any difficulty performing the following activities:  Hearing? 0 0  Vision? 0 0  Difficulty concentrating or making decisions? 0 0  Walking or climbing stairs? 0 0  Dressing or bathing? 0 0  Doing errands, shopping? 0 0  Preparing Food and eating ? N   Using the Toilet? N   In the past six months, have you accidently leaked urine? N   Do you have problems with loss of bowel control? N   Managing your Medications? N    Managing your Finances? N   Housekeeping or managing your Housekeeping? N      Cognitive Testing  Alert? Yes  Normal Appearance?Yes  Oriented to person? Yes  Place? Yes   Time? Yes  Recall of three objects?  Yes  Can perform simple calculations? Yes  Displays appropriate judgment?Yes  Can read the correct time from a watch face?Yes  EOL planning: Does Patient Have a Medical Advance Directive?: No Would patient like information on creating a medical advance directive?: No - Patient declined  Review of Systems  Constitutional:  Negative for malaise/fatigue and weight loss.  HENT:  Negative for congestion, hearing loss, sore  throat and tinnitus.   Eyes:  Negative for blurred vision and double vision.  Respiratory:  Positive for cough (chronic) and sputum production. Negative for shortness of breath and wheezing.   Cardiovascular:  Negative for chest pain, palpitations, orthopnea, claudication, leg swelling and PND.  Gastrointestinal:  Negative for abdominal pain, blood in stool, constipation, diarrhea, heartburn, melena, nausea and vomiting.  Genitourinary: Negative.   Musculoskeletal:  Negative for falls, joint pain and myalgias.  Skin:  Negative for rash.  Neurological:  Negative for dizziness, tingling, sensory change, weakness and headaches.  Endo/Heme/Allergies:  Negative for environmental allergies and polydipsia.  Psychiatric/Behavioral: Negative.  Negative for depression, memory loss, substance abuse and suicidal ideas. The patient is not nervous/anxious and does not have insomnia.   All other systems reviewed and are negative.    Objective:     Today's Vitals   01/29/22 1118  BP: 136/72  Pulse: 64  Temp: (!) 97.2 F (36.2 C)  SpO2: 98%  Weight: 161 lb (73 kg)  Height: '5\' 5"'$  (1.651 m)   Body mass index is 26.79 kg/m.  General appearance: alert, no distress, WD/WN, female HEENT: normocephalic, sclerae anicteric, TMs pearly, nares patent, no discharge or  erythema, pharynx normal Oral cavity: MMM, no lesions Neck: supple, no lymphadenopathy, no thyromegaly, no masses Heart: RRR, normal S1, S2, no murmurs Lungs: Scattered rhonchi to bilateral bases without wheezes or rales Abdomen: +bs, soft, non tender, non distended, no masses, no hepatomegaly, no splenomegaly Musculoskeletal: nontender, no swelling, no obvious deformity Extremities: no edema, no cyanosis, no clubbing Pulses: 2+ symmetric, upper and lower extremities, normal cap refill Neurological: alert, oriented x 3, CN2-12 intact, strength normal upper extremities and lower extremities, sensation normal throughout, DTRs 2+ throughout, no cerebellar signs, gait normal Psychiatric: normal affect, behavior normal, pleasant   Medicare Attestation I have personally reviewed: The patient's medical and social history Their use of alcohol, tobacco or illicit drugs Their current medications and supplements The patient's functional ability including ADLs,fall risks, home safety risks, cognitive, and hearing and visual impairment Diet and physical activities Evidence for depression or mood disorders  The patient's weight, height, BMI, and visual acuity have been recorded in the chart.  I have made referrals, counseling, and provided education to the patient based on review of the above and I have provided the patient with a written personalized care plan for preventive services.     Darrol Jump, NP   01/29/2022

## 2022-01-29 NOTE — Patient Instructions (Signed)
Eating Plan for Brain Health A healthy diet is an important part of overall wellness, and it may help to improve brain function. Eating a healthy diet can lower the risk for Alzheimer's disease and dementia. It also may slow the progression of those diseases. Depending on your overall health and any conditions you have, you may need to follow certain dietary guidelines. Work with your health care provider or nutrition specialist (dietitian) to create an eating plan that is right for you. What are tips for following this plan? Reading food labels Check the Nutrition Facts on food labels for the Daily Value (DV) percentages of nutrients in one serving of food. DVs are based on the recommended amounts of nutrients to eat, or not to exceed, each day. Aim for a DV of 5% or less per serving for: Saturated fats. Trans fats. Cholesterol. Salt (sodium). Choose whole grains instead of processed grains such as white flour, white bread, and white rice. "Whole grain" or "whole wheat" should be among the first items in the ingredients list. Shopping Before you go grocery shopping, plan your meals and make a shopping list. Look for lean meats, fish, low-fat dairy products, fruits and vegetables, and whole grains. Avoid buying processed or prepared foods. These are higher in added sugar, fat, and sodium. Cooking Use healthy oils, such as olive oil, instead of butter or margarine. Avoid frying foods. Healthier ways of cooking include roasting, baking, poaching, and steaming. Many healthy foods can be prepared without cooking, such as canned tuna, nuts, beans, vegetables, and fruits. Meal planning Plan to eat one or more servings of each of these foods every day: Green leafy vegetables. Nuts. Whole grains. Plan to eat berries, beans, fish, and poultry two or more times every week. Avoid red meats, butter, cheese, sweets, and fried foods. Eat 6 smaller meals throughout the day rather than 3 full  meals. General tips If you have difficulty chewing or swallowing: Choose foods that are tender, soft, and moist. Avoid foods that are sticky, hard, dry, chewy, or crunchy. Cut food into pieces that are smaller than your thumbnail. If you are underweight: Add extra protein or calories to meals by adding cream, nut butters, or protein powder to foods and drinks. Drink nutritional supplement shakes as told by your health care provider or dietitian. Drink enough fluid to keep your urine pale yellow. If you drink alcohol: Limit how much you use to: 0-1 drink a day for women who are not pregnant. 0-2 drinks a day for men. Be aware of how much alcohol is in your drink. In the [place] one drink equals one 12 oz bottle of beer (355 mL), one 5 oz glass of wine (148 mL), or one 1 oz glass of hard liquor (44 mL). Take daily vitamin and mineral supplements as told by your health care provider or dietitian. Follow daily calorie and nutrient intake goals as told by your dietitian. What foods are recommended?  Foods that are high in omega-3s (omega-3 fatty acids). Omega-3s are often found in coldwater fish. They are also found in ground flaxseed, walnuts, edamame, and seaweed. It is recommended that adults eat at least 8 oz of fish or other seafood every week. Wild salmon, albacore, tuna, sardines, and farmed trout are among the best sources for omega-3s. Bake or grill fish instead of frying it to avoid unhealthy fats. Foods that contain vitamins C, D, and E. These include: Avocados. Beans. Nuts and seeds. Green leafy vegetables, like spinach and kale. Cruciferous vegetables,   like broccoli or Brussels sprouts. Cherries and berries, especially blackberries and blueberries. Berries have antioxidants and nutrients that support memory function. Whole grains, such as oatmeal, whole-grain cereal, whole-grain bread, brown rice, and barley. Herbs and spices. Cooking with certain herbs and spices, such as  turmeric, can help you absorb vitamins. Foods in the Mediterranean diet, the DASH (Dietary Approaches to Stop Hypertension) diet, or a combination of foods in both plans. These diets recommend that you: Eat green leafy vegetables, nuts, and whole grains every day. Drink one glass of wine a day if appropriate. Eat berries, beans, fish, and poultry two or more times every week. Limit red meats, butter, cheese, sweets, fried foods, and fast food to once a week or less. Healthy breakfast foods, such as whole-grain cereal, low-fat yogurt, berries or vegetables, and nuts. Eating breakfast every day can boost brain function and concentration for people of all ages. What foods are not recommended? Foods that are high in trans fats, including: Fried foods. Snack foods such as potato chips. Pastries like cookies and donuts, especially those that come prepackaged. Foods that are high in saturated fats, including: Processed, precooked, or cured meat, such as sausages or meat loaves. Red meat. Certain dairy products like butter, margarine, and some cheeses. Processed grains, such as white bread. Sweets and fast food. Limit these types of food to once a week or less. Summary Eating a nutritious diet can support brain health as well as overall wellness. Choose foods that are high in omega-3 fatty acids, vitamins, and whole grains. Avoid foods that contain trans fats and saturated fats. Limit sweets and fast food. This information is not intended to replace advice given to you by your health care provider. Make sure you discuss any questions you have with your health care provider. Document Revised: 09/30/2019 Document Reviewed: 09/30/2019 Elsevier Patient Education  2023 [employer]  

## 2022-01-31 ENCOUNTER — Ambulatory Visit: Payer: Medicare Other | Admitting: Emergency Medicine

## 2022-02-05 ENCOUNTER — Telehealth: Payer: Self-pay | Admitting: Nurse Practitioner

## 2022-02-05 NOTE — Telephone Encounter (Signed)
Medication removed from med list per patients request. Nothing further needed

## 2022-02-14 ENCOUNTER — Other Ambulatory Visit: Payer: Self-pay | Admitting: Nurse Practitioner

## 2022-03-04 ENCOUNTER — Other Ambulatory Visit: Payer: Self-pay | Admitting: Nurse Practitioner

## 2022-03-04 DIAGNOSIS — M2550 Pain in unspecified joint: Secondary | ICD-10-CM

## 2022-03-19 ENCOUNTER — Other Ambulatory Visit: Payer: Self-pay | Admitting: Cardiology

## 2022-04-05 DIAGNOSIS — Z1231 Encounter for screening mammogram for malignant neoplasm of breast: Secondary | ICD-10-CM | POA: Diagnosis not present

## 2022-04-05 DIAGNOSIS — M8588 Other specified disorders of bone density and structure, other site: Secondary | ICD-10-CM | POA: Diagnosis not present

## 2022-04-10 ENCOUNTER — Ambulatory Visit: Payer: Medicare Other | Admitting: Emergency Medicine

## 2022-04-18 ENCOUNTER — Encounter: Payer: Self-pay | Admitting: Nurse Practitioner

## 2022-04-18 ENCOUNTER — Other Ambulatory Visit: Payer: Self-pay

## 2022-04-18 ENCOUNTER — Ambulatory Visit (INDEPENDENT_AMBULATORY_CARE_PROVIDER_SITE_OTHER): Payer: Medicare Other | Admitting: Nurse Practitioner

## 2022-04-18 VITALS — BP 155/80 | HR 61 | Temp 97.3°F | Ht 65.0 in | Wt 159.2 lb

## 2022-04-18 DIAGNOSIS — R5383 Other fatigue: Secondary | ICD-10-CM

## 2022-04-18 DIAGNOSIS — R062 Wheezing: Secondary | ICD-10-CM | POA: Diagnosis not present

## 2022-04-18 DIAGNOSIS — R051 Acute cough: Secondary | ICD-10-CM

## 2022-04-18 DIAGNOSIS — J441 Chronic obstructive pulmonary disease with (acute) exacerbation: Secondary | ICD-10-CM

## 2022-04-18 DIAGNOSIS — R0602 Shortness of breath: Secondary | ICD-10-CM | POA: Diagnosis not present

## 2022-04-18 MED ORDER — IPRATROPIUM-ALBUTEROL 0.5-2.5 (3) MG/3ML IN SOLN: 3.0000 mL | Freq: Once | RESPIRATORY_TRACT | Status: DC

## 2022-04-18 MED ORDER — AZITHROMYCIN 250 MG PO TABS: ORAL_TABLET | ORAL | 1 refills | Status: DC

## 2022-04-18 MED ORDER — PREDNISONE 20 MG PO TABS: ORAL_TABLET | ORAL | 0 refills | Status: DC

## 2022-04-18 NOTE — Progress Notes (Signed)
Assessment and Plan:  [name] was seen today for an acute visit .  Diagnoses and all order for this visit:  1. COPD exacerbation (HCC) Continue inhalers as directed. Stay well hydrated to keep mucus thin and productive.  - ipratropium-albuterol (DUONEB) 0.5-2.5 (3) MG/3ML nebulizer solution 3 mL - predniSONE (DELTASONE) 20 MG tablet; Take 2 tabs (40 mg) for 3 days followed by 1 tab (20 mg) for 4 days.  Dispense: 10 tablet; Refill: 0  2. Short of breath on exertion Report to ER for any increase in difficulty breathing.  - ipratropium-albuterol (DUONEB) 0.5-2.5 (3) MG/3ML nebulizer solution 3 mL - azithromycin (ZITHROMAX) 250 MG tablet; Take 2 tablets (500 mg) on Day 1 followed by 1 tablet  (250 mg) daily until complete.  Dispense: 6 each; Refill: 1  3. Acute cough  - azithromycin (ZITHROMAX) 250 MG tablet; Take 2 tablets (500 mg) on Day 1 followed by 1 tablet  (250 mg) daily until complete.  Dispense: 6 each; Refill: 1  4. Wheezing Continue inhalers as directed Duoneb administered.  Patient tolerated weill with diminished wheezing post tmt.  - ipratropium-albuterol (DUONEB) 0.5-2.5 (3) MG/3ML nebulizer solution 3 mL  5. Other fatigue Rest.   Continue to monitor for any increase in fever, chills, N/V, difficulty breathing. Notify office for further evaluation and treatment, questions or concerns if s/s fail to improve. The risks and benefits of my recommendations, as well as other treatment options were discussed with the patient today. Questions were answered.  Further disposition pending results of labs. Discussed med's effects and SE's.    Over 15 minutes of exam, counseling, chart review, and critical decision making was performed.   Future Appointments  Date Time Provider [place]  05/02/2022  3:30 PM Collene Gobble, MD [facility] None  05/07/2022  3:00 PM Unk Pinto, MD [facility] None  11/07/2022 11:00 AM Darrol Jump, NP [facility] None     ------------------------------------------------------------------------------------------------------------------   HPI BP (!) 178/88   Pulse 61   Temp (!) 97.3 F (36.3 C)   Ht '5\' 5"'$  (1.651 m)   Wt 159 lb 3.2 oz (72.2 kg)   SpO2 90%   BMI 26.49 kg/m    Patient complains of symptoms of a URI. Symptoms include congestion, cough described as paroxysmal, shortness of breath, and wheezing. Onset of symptoms was 1 week ago, and has been unchanged since that time. Treatment to date: antihistamines, cough suppressants, and decongestants. Denies fever, chills, N/V.  She stopped smoking 2021.  Past Medical History:  Diagnosis Date   Allergy    enviromental   Anxiety    Arthritis    osteoarthritis   COPD (chronic obstructive pulmonary disease) (HCC)    mild   Diabetes mellitus without complication ([place])    controlled by diet   Environmental allergies    GERD (gastroesophageal reflux disease)    Hepatitis    hepatitis A   History of colon polyps    Hyperlipidemia    Hypertension    IBS (irritable bowel syndrome)    Mixed hyperlipidemia 06/15/2013   PONV (postoperative nausea and vomiting)    second surgery no problems last surgery   Solitary pulmonary nodule 06/02/2019   Urgency of urination    Takes oxybutynin; under control at this time     Allergies  Allergen Reactions   Enalapril Swelling    Caused lips to swell and bloating.   Statins     Tingling with several statins   Repatha [Evolocumab]  Stated she didn't tolerate it and switched to Praluent    Current Outpatient Medications on File Prior to Visit  Medication Sig   albuterol (VENTOLIN HFA) 108 (90 Base) MCG/ACT inhaler Inhale 2 puffs into the lungs every 6 (six) hours as needed for wheezing or shortness of breath.   Ascorbic Acid (VITAMIN C) 1000 MG tablet Take 1,000 mg by mouth daily.   aspirin 81 MG tablet Take 81 mg by mouth every other day.    bisoprolol-hydrochlorothiazide (ZIAC) 10-6.25 MG  tablet TAKE 1/2 TABLET BY MOUTH DAILY   buPROPion (WELLBUTRIN SR) 150 MG 12 hr tablet Take 1 tablet (150 mg total) by mouth 2 (two) times daily.   Cholecalciferol (VITAMIN D3) 5000 UNITS TABS Take 5,000 Units by mouth daily.   clonazePAM (KLONOPIN) 0.5 MG tablet TAKE 1/2 TO 1 TABLET BY MOUTH ONCE TO TWICE DAILY ONLY IF NEEDED FOR ANXIETY ATTACK AND LIMIT TO 5 DAYS PER WEEK TO AVOID ADDICTION AND DEMENTIA   clopidogrel (PLAVIX) 75 MG tablet Take  1 tablet  Daily  to Prevent Blood Clots   Cyanocobalamin (VITAMIN B12 PO) Take 1,000 mcg by mouth daily.   fluticasone (FLONASE) 50 MCG/ACT nasal spray 1 Spray each nostril daily   losartan (COZAAR) 100 MG tablet Take  1 tablet Daily for BP                                          /                      TAKE                          BY                                MOUTH   meloxicam (MOBIC) 7.5 MG tablet TAKE 1 TABLET(7.5 MG) BY MOUTH DAILY AS NEEDED FOR ARTHRITIS PAIN   methylcellulose oral powder Take by mouth daily.    montelukast (SINGULAIR) 10 MG tablet TAKE 1 TABLET(10 MG) BY MOUTH DAILY   Multiple Vitamins-Minerals (MULTI COMPLETE PO) Take 1 capsule by mouth daily.   NIFEdipine (PROCARDIA XL/NIFEDICAL XL) 60 MG 24 hr tablet Take 1 tablet (60 mg total) by mouth daily.   Omega-3 Fatty Acids (FISH OIL) 1000 MG CAPS Take 1 capsule (1,000 mg total) by mouth 2 (two) times daily with a meal.   omeprazole (PRILOSEC) 40 MG capsule Take  1 capsule  2 x /day (every 12 hours) to Prevent Heartburn & Indigestion / Patient knows to take by mouth   PEG-KCl-NaCl-NaSulf-Na Asc-C (PLENVU) 140 g SOLR Use as directed for colonoscopy prep.PIR:[id number] YSA:[id number] SWFUX:[id number] UR:[id number]   PRALUENT 150 MG/ML SOAJ INJECT 150 MG INTO THE SKIN EVERY 14 DAYS   Tiotropium Bromide-Olodaterol (STIOLTO RESPIMAT) 2.5-2.5 MCG/ACT AERS Inhale 2 puffs into the lungs daily.   Tiotropium Bromide-Olodaterol (STIOLTO RESPIMAT) 2.5-2.5 MCG/ACT AERS Inhale 2 puffs into the lungs daily.    Zinc 50 MG TABS Take by mouth daily.   No current facility-administered medications on file prior to visit.    ROS: all negative except what is noted in the HPI.   Physical Exam:  BP (!) 178/88   Pulse 61   Temp (!) 97.3 F (36.3 C)   Ht 5'  5" (1.651 m)   Wt 159 lb 3.2 oz (72.2 kg)   SpO2 90%   BMI 26.49 kg/m   General Appearance: NAD.  Awake, conversant and cooperative. Eyes: PERRLA, EOMs intact.  Sclera white.  Conjunctiva without erythema. Sinuses: No frontal/maxillary tenderness.  No nasal discharge. Nares patent.  ENT/Mouth: Ext aud canals clear.  Bilateral TMs w/DOL and without erythema or bulging. Hearing intact.  Posterior pharynx without swelling or exudate.  Tonsils without swelling or erythema.  Neck: Supple.  No masses, nodules or thyromegaly. Respiratory: Effort is regular with non-labored breathing. Breath sounds are equal bilaterally scattered wheezing posteriorly in right upper lung field. Cardio: RRR with no MRGs. Brisk peripheral pulses without edema.  Abdomen: Active BS in all four quadrants.  Soft and non-tender without guarding, rebound tenderness, hernias or masses. Lymphatics: Non tender without lymphadenopathy.  Musculoskeletal: Full ROM, 5/5 strength, normal ambulation.  No clubbing or cyanosis. Skin: Appropriate color for ethnicity. Warm without rashes, lesions, ecchymosis, ulcers.  Neuro: CN II-XII grossly normal. Normal muscle tone without cerebellar symptoms and intact sensation.   Psych: AO X 3,  appropriate mood and affect, insight and judgment.     Darrol Jump, NP 4:13 PM [facility]

## 2022-04-18 NOTE — Patient Instructions (Signed)

## 2022-04-20 ENCOUNTER — Encounter: Payer: Self-pay | Admitting: Nurse Practitioner

## 2022-04-24 ENCOUNTER — Encounter: Payer: Medicare Other | Admitting: Internal Medicine

## 2022-05-02 ENCOUNTER — Encounter: Payer: Self-pay | Admitting: Emergency Medicine

## 2022-05-02 ENCOUNTER — Ambulatory Visit: Payer: Medicare Other | Admitting: Emergency Medicine

## 2022-05-02 DIAGNOSIS — J432 Centrilobular emphysema: Secondary | ICD-10-CM | POA: Diagnosis not present

## 2022-05-02 DIAGNOSIS — R053 Chronic cough: Secondary | ICD-10-CM

## 2022-05-02 DIAGNOSIS — Z720 Tobacco use: Secondary | ICD-10-CM

## 2022-05-02 DIAGNOSIS — R059 Cough, unspecified: Secondary | ICD-10-CM | POA: Insufficient documentation

## 2022-05-02 MED ORDER — MONTELUKAST SODIUM 10 MG PO TABS: 10.0000 mg | ORAL_TABLET | Freq: Every day | ORAL | 11 refills | Status: DC

## 2022-05-02 NOTE — Assessment & Plan Note (Signed)
Benefiting from her [place].  Rare albuterol use.  Plan to continue same.

## 2022-05-02 NOTE — Progress Notes (Signed)
   Subjective:    Patient ID: [name], female    DOB: 09/10/1950, 71 y.o.   MRN: [id number]  HPI  ROV 05/02/2022 --follow-up visit for 71 year old woman with history of tobacco use.  I last saw her in June 2021.  She has a history of hypertension, diabetes, hyperlipidemia and GERD.  I see her for COPD and obstructive sleep apnea.  She is not on CPAP. Back on cigarettes for over a year, 10 cig a day.  Today she reports that she started to have increased nasal congestion and drainage, sneezing. She had increased dry cough and throat noise. She was treated w pred + azithro. Her cough improved, but sems to be bothering her again now that the meds are finished. She is on flonase, formerly on singulair but not currently. On omeprazole 40 qd.  She is doing occasional [place]. She is on stiolto, rare albuterol use.    Review of Systems  Constitutional:  Negative for fever and unexpected weight change.  HENT:  Negative for congestion, dental problem, ear pain, nosebleeds, postnasal drip, rhinorrhea, sinus pressure, sneezing, sore throat and trouble swallowing.   Eyes:  Negative for redness and itching.  Respiratory:  Positive for cough and shortness of breath. Negative for chest tightness and wheezing.   Cardiovascular:  Negative for palpitations and leg swelling.  Gastrointestinal:  Negative for nausea and vomiting.  Genitourinary:  Negative for dysuria.  Musculoskeletal:  Negative for joint swelling.  Skin:  Negative for rash.  Neurological:  Negative for headaches.  Hematological:  Does not bruise/bleed easily.  Psychiatric/Behavioral:  Negative for dysphoric mood. The patient is not nervous/anxious.        Objective:   Physical Exam Vitals:   05/02/22 1522  BP: 136/74  Pulse: 70  SpO2: 95%  Weight: 162 lb 6.4 oz (73.7 kg)  Height: '5\' 5"'$  (1.651 m)   Gen: Pleasant, well-nourished, in no distress,  normal affect  ENT: No lesions,  mouth clear,  oropharynx clear, no postnasal  drip  Neck: No JVD, no stridor  Lungs: No use of accessory muscles, clear B with a normal breath and also on a forced expiration  Cardiovascular: RRR, heart sounds normal, no murmur or gallops, no peripheral edema  Musculoskeletal: No deformities, no cyanosis or clubbing  Neuro: alert, non focal  Skin: Warm, no lesions or rashes     Assessment & Plan:  Cough Acute on chronic cough.  Flaring rhinitis and allergies seem to be the most significant precipitant.  She was treated briefly with azithromycin and prednisone with temporary relief but is starting to have symptoms again.  She has sneezing, rhinitis.  She is using fluticasone nasal spray as needed, we will change this to every day on a schedule.  She is off her Singulair and we will restart it.  I will also add loratadine.  Finally she does have some breakthrough GERD and we will temporarily increase her omeprazole to twice a day.  Follow-up in office to see if she responds.  I do not hear any wheezing or any clear indication to treat her for COPD flare.  Tobacco abuse Discussed cessation with her today.  She is not ready to set a quit date  COPD GOLD A-B Benefiting from her [place].  Rare albuterol use.  Plan to continue same.  Baltazar Apo, MD, PhD 05/02/2022, 3:58 PM [facility] [phone] or if no answer [phone]

## 2022-05-02 NOTE — Assessment & Plan Note (Signed)
Acute on chronic cough.  Flaring rhinitis and allergies seem to be the most significant precipitant.  She was treated briefly with azithromycin and prednisone with temporary relief but is starting to have symptoms again.  She has sneezing, rhinitis.  She is using fluticasone nasal spray as needed, we will change this to every day on a schedule.  She is off her Singulair and we will restart it.  I will also add loratadine.  Finally she does have some breakthrough GERD and we will temporarily increase her omeprazole to twice a day.  Follow-up in office to see if she responds.  I do not hear any wheezing or any clear indication to treat her for COPD flare.

## 2022-05-02 NOTE — Assessment & Plan Note (Signed)
Discussed cessation with her today.  She is not ready to set a quit date. 

## 2022-05-02 NOTE — Patient Instructions (Addendum)
Continue your Stiolto 2 puffs once daily. Keep albuterol available to use 2 puffs if needed for shortness of breath, chest tightness, wheezing. Use your fluticasone nasal spray (Flonase) 2 puffs each nostril once daily.  Take this every day on a schedule Restart your Singulair 10 mg each evening Start loratadine 10 mg (generic Claritin) once daily. Temporarily increase your omeprazole to 40 mg twice a day.  Take this medication 1 hour around food.  Stay on this dose for 2 weeks and then go back to once a day. Continue to work on decreasing your cigarettes Follow with APP in 2 to 3 weeks to assess her progress Follow with Dr Lamonte Sakai in 6 months or sooner if you have any problems

## 2022-05-03 DIAGNOSIS — J329 Chronic sinusitis, unspecified: Secondary | ICD-10-CM | POA: Diagnosis not present

## 2022-05-06 ENCOUNTER — Encounter: Payer: Self-pay | Admitting: Internal Medicine

## 2022-05-06 NOTE — Patient Instructions (Signed)

## 2022-05-06 NOTE — Progress Notes (Unsigned)
Annual Screening/Preventative Visit & Comprehensive Evaluation &  Examination   Future Appointments  Date Time Provider Department  05/07/2022                  cpe  3:00 PM Unk Pinto, MD [facility]  11/07/2022                   wellness 11:00 AM Darrol Jump, NP [facility]  05/13/2023                  cpe  3:00 PM Unk Pinto, MD [facility]        This very nice 71 y.o. [place] Lady with HTN, HLD, Prediabetes  and Vitamin D Deficiency  who presents for a Screening /Preventative Visit & comprehensive evaluation and management of multiple medical co-morbidities.   Abdominal CT scan in 2018 showed Aortic Atherosclerosis. Patient also has hx/o OSA on CPAP & COPD overlap followed by Dr Lamonte Sakai.  Patient also has GERD controlled on Omeprazole.         HTN predates since 89.   Patient has hx/o a retinal Aa occlusion & was started on Plavix ( as anti-platelet agent as per Up-to-date Recommendations ). Patient has had Cardiology evaluation in the past by Dr Stanford Breed & she has hx/o coronary artery calcification and also Aortic Atherosclerosis. Stress cardiac Echo was negative. Patient's BP has been controlled at home and patient denies any cardiac symptoms as chest pain, palpitations, shortness of breath, dizziness or ankle swelling. Today's BP is at goal - 130/70 .        Patient has hx/o  intolerance to Statins and also Repatha, but is tolerating Praulent controlling her Lipids. Patient denies myalgias or other medication SE's. Last lipids were at goal :  Lab Results  Component Value Date   CHOL 172 10/24/2021   HDL 86 10/24/2021   LDLCALC 67 10/24/2021   TRIG 106 10/24/2021   CHOLHDL 2.0 10/24/2021         Patient has hx/o prediabetes predating (A1c 5.7% /2017) and patient denies reactive hypoglycemic symptoms, visual blurring, diabetic polys or paresthesias. Last A1c was at goal :  Lab Results  Component Value Date   HGBA1C 5.5 10/24/2021          Finally, patient has history of Vitamin D Deficiency and last Vitamin D was at goal :  Lab Results  Component Value Date   VD25OH 61 01/24/2021     Current Outpatient Medications  Medication Instructions   albuterol  HFA  inhaler 2 Inhalations, Every 6 hours PRN   Aspirin   81 mg Every other day   bisoprolol-hctz 10-6.25 MG tablet TAKE 1/2 TABLET DAILY   buPROPion SR 150 mg  2 times daily   clonazePAM  0.5 MG tablet Take 1/2-1 tab  1 - 2 x/day  ONLY IF NEEDED    PLAVIX 75 MG tablet Take  1 tablet  Daily   VITAMIN B12    1,000 mcg Daily   Fish Oil    1,000 mg 2 times daily with meals   FLONASE  nasal spray 1 Spray each nostril daily   losartan  100 MG tablet Take  1 tablet Daily   meloxicam  7.5 MG tablet TAKE 1 TABLET  DAILY AS NEEDED    methylcellulose oral powder Daily   montelukast   10 mg 1   Daily   Multiple Vitamins-Minerals  1 capsule, Daily   NIFEdipine  XL 60 mg,  Oral, Daily   omeprazole 40 MG capsule Take  1 capsule  2 x /day    PRALUENT 150 MG/ML SOAJ INJECT 150 MG INTO THE SKIN EVERY 14 DAYS   STIOLTO RESPIMAT  2.5-2.5  2  Inhalations Daily   vitamin C  1,000 mg Daily   Vitamin D  5,000 Units Daily   Zinc 50 MG TABS Daily     Allergies  Allergen Reactions   Enalapril Swelling    Caused lips to swell and bloating.   Statins     Tingling with several statins   Repatha [Evolocumab]     Stated she didn't tolerate it and switched to Praluent     Past Medical History:  Diagnosis Date   Allergy    enviromental   Anxiety    Arthritis    osteoarthritis   COPD (chronic obstructive pulmonary disease) (HCC)    mild   Diabetes mellitus without complication ([place])    controlled by diet   Environmental allergies    GERD (gastroesophageal reflux disease)    Hepatitis    hepatitis A   History of colon polyps    Hyperlipidemia    Hypertension    IBS (irritable bowel syndrome)    Mixed hyperlipidemia 06/15/2013   PONV (postoperative nausea and  vomiting)    second surgery no problems last surgery   Solitary pulmonary nodule 06/02/2019   Urgency of urination    Takes oxybutynin; under control at this time     Health Maintenance  Topic Date Due   Zoster Vaccines- Shingrix (1 of 2) Never done   COVID-19 Vaccine (3 - Pfizer risk series) 10/07/2019   MAMMOGRAM  11/13/2019   TETANUS/TDAP  02/06/2021   INFLUENZA VACCINE  02/13/2021   COLONOSCOPY  02/09/2024   DEXA SCAN  Completed   Hepatitis C Screening  Completed   HPV VACCINES  Aged Out     Immunization History  Administered Date(s) Administered   Influenza, High Dose  04/10/2017, 05/08/2019, 05/24/2020   Influenza 03/05/2018   PFIZER SARS-COV-2 Vacc 08/13/2019, 09/09/2019   PPD Test 09/21/2014   Pneumococcal -13 10/22/2017   Pneumococcal -23 02/07/2011, 01/26/2016   Tdap 02/07/2011     Last Colon - 02/08/2014 - Dr Deatra Ina - recc 5 year f/u - overdue & per Dr Loletha Carrow pending Cardiology consultation.  05/04/2022 sent letter from Dr Loletha Carrow.  Last MGM - 11/13/18 - overdue & patient aware.   Past Surgical History:  Procedure Laterality Date   APPENDECTOMY     removed as child   BACK SURGERY     lumbar fusion   CATARACT EXTRACTION Left 2019   COLONOSCOPY     DILATION AND CURETTAGE OF UTERUS     LUMBAR FUSION  02/06/2012   L 4  L5   PARTIAL HYSTERECTOMY     POLYPECTOMY     TOTAL HIP ARTHROPLASTY  06/02/2012   Procedure: TOTAL HIP ARTHROPLASTY;  Surgeon: Kerin Salen, MD;  Location: [facility];  Service: Orthopedics;  Laterality: Right;   TOTAL HIP ARTHROPLASTY Left 01/25/2016   TOTAL HIP ARTHROPLASTY Left 01/25/2016   Procedure: LEFT TOTAL HIP ARTHROPLASTY;  Surgeon: Frederik Pear, MD;  Location: [facility];  Service: Orthopedics;  Laterality: Left;   TOTAL KNEE ARTHROPLASTY Right 12/15/2012   Procedure: TOTAL KNEE ARTHROPLASTY;  Surgeon: Kerin Salen, MD;  Location: [facility];  Service: Orthopedics;  Laterality: Right;   TOTAL KNEE ARTHROPLASTY Left 06/18/2016   Procedure: TOTAL  KNEE ARTHROPLASTY;  Surgeon: Frederik Pear,  MD;  Location: [facility];  Service: Orthopedics;  Laterality: Left;   UPPER GASTROINTESTINAL ENDOSCOPY  05/06/2019     Family History  Problem Relation Age of Onset   Diabetes Mother    Colon cancer Mother 19   Hypertension Father    CAD Father    Colon cancer Sister 71   Lung cancer Brother 33   Hypertension Brother    Hypertension Brother    Cancer Maternal Grandmother        Unsure what kind   Peripheral Artery Disease Paternal Grandfather    Esophageal cancer Neg Hx    Rectal cancer Neg Hx    Stomach cancer Neg Hx      Social History   Tobacco Use   Smoking status: Light Smoker    Packs/day: 1.00    Years: 35.00    Pack years: 35.00    Types: Cigarettes    Start date: 19    Last attempt to quit: 11/02/2019    Years since quitting: 1.4   Smokeless tobacco: Never   Tobacco comments:    Quit 10/05/2019   Vaping Use   Vaping Use: Never used  Substance Use Topics   Alcohol use: Yes    Comment: 2-4 beers per night, advised to cut back    Drug use: Yes    Types: Marijuana    Comment: current, 5 joints/month       ROS Constitutional: Denies fever, chills, weight loss/gain, headaches, insomnia,  night sweats, and change in appetite. Does c/o fatigue. Eyes: Denies redness, blurred vision, diplopia, discharge, itchy, watery eyes.  ENT: Denies discharge, congestion, post nasal drip, epistaxis, sore throat, earache, hearing loss, dental pain, Tinnitus, Vertigo, Sinus pain, snoring.  Cardio: Denies chest pain, palpitations, irregular heartbeat, syncope, dyspnea, diaphoresis, orthopnea, PND, claudication, edema Respiratory: denies cough, dyspnea, DOE, pleurisy, hoarseness, laryngitis, wheezing.  Gastrointestinal: Denies dysphagia, heartburn, reflux, water brash, pain, cramps, nausea, vomiting, bloating, diarrhea, constipation, hematemesis, melena, hematochezia, jaundice, hemorrhoids Genitourinary: Denies dysuria, frequency, urgency,  nocturia, hesitancy, discharge, hematuria, flank pain Breast: Breast lumps, nipple discharge, bleeding.  Musculoskeletal: Denies arthralgia, myalgia, stiffness, Jt. Swelling, pain, limp, and strain/sprain. Denies falls. Skin: Denies puritis, rash, hives, warts, acne, eczema, changing in skin lesion Neuro: No weakness, tremor, incoordination, spasms, paresthesia, pain Psychiatric: Denies confusion, memory loss, sensory loss. Denies Depression. Endocrine: Denies change in weight, skin, hair change, nocturia, and paresthesia, diabetic polys, visual blurring, hyper / hypo glycemic episodes.  Heme/Lymph: No excessive bleeding, bruising, enlarged lymph nodes.  Physical Exam  BP 130/70   Pulse 76   Temp 97.9 F (36.6 C)   Resp 17   Ht '5\' 5"'$  (1.651 m)   Wt 161 lb 3.2 oz (73.1 kg)   SpO2 96%   BMI 26.83 kg/m   General Appearance: Well nourished, well groomed and in no apparent distress.  Eyes: PERRLA, EOMs, conjunctiva no swelling or erythema, normal fundi and vessels. Sinuses: No frontal/maxillary tenderness ENT/Mouth: EACs patent / TMs  nl. Nares clear without erythema, swelling, mucoid exudates. Oral hygiene is good. No erythema, swelling, or exudate. Tongue normal, non-obstructing. Tonsils not swollen or erythematous. Hearing normal.  Neck: Supple, thyroid not palpable. No bruits, nodes or JVD. Respiratory: Respiratory effort normal.  BS equal and clear bilateral without rales, rhonci, wheezing or stridor. Cardio: Heart sounds are normal with regular rate and rhythm and no murmurs, rubs or gallops. Peripheral pulses are normal and equal bilaterally without edema. No aortic or femoral bruits. Chest: symmetric with normal excursions and  percussion. Breasts: Symmetric, without lumps, nipple discharge, retractions, or fibrocystic changes.  Abdomen: Flat, soft with bowel sounds active. Nontender, no guarding, rebound, hernias, masses, or organomegaly.  Lymphatics: Non tender without  lymphadenopathy.  Musculoskeletal: Full ROM all peripheral extremities, joint stability, 5/5 strength, and normal gait. Skin: Warm and dry without rashes, lesions, cyanosis, clubbing or  ecchymosis.  Neuro: Cranial nerves intact, reflexes equal bilaterally. Normal muscle tone, no cerebellar symptoms. Sensation intact.  Pysch: Alert and oriented X 3, normal affect, Insight and Judgment appropriate.    Assessment and Plan  1. Annual Preventative Screening Examination   2. Essential hypertension  - EKG 12-Lead - Urinalysis, Routine w reflex microscopic - Microalbumin / creatinine urine ratio - CBC with Differential/Platelet - COMPLETE METABOLIC PANEL WITH GFR - Magnesium - TSH  3. Hyperlipidemia, mixed  - EKG 12-Lead - Lipid panel - TSH  4. Abnormal glucose  - EKG 12-Lead - Hemoglobin A1c - Insulin, random  5. Vitamin D deficiency  - VITAMIN D 25 Hydroxy   6. 3-vessel CAD  - EKG 12-Lead - Lipid panel  7. Aortic atherosclerosis by Abd CT scan in 2018  [facility])  - EKG 12-Lead - Lipid panel  8. Statin intolerance  - Lipid panel  9. OSA on CPAP   10. Gastroesophageal reflux disease  - CBC with Differential/Platelet  11. Screening for colorectal cancer  - POC Hemoccult Bld/Stl   12. Screening for heart disease  - EKG 12-Lead  13. FHx: heart disease  - EKG 12-Lead  14. Former smoker  - EKG 12-Lead  15. Medication management  - Urinalysis, Routine w reflex microscopic - Microalbumin / creatinine urine ratio - CBC with Differential/Platelet - COMPLETE METABOLIC PANEL WITH GFR - Magnesium - Lipid panel - TSH - Hemoglobin A1c - Insulin, random - VITAMIN D 25 Hydroxy         Patient was counseled in prudent diet to achieve/maintain BMI less than 25 for weight control, BP monitoring, regular exercise and medications. Discussed med's effects and SE's. Screening labs and tests as requested with regular follow-up as recommended. Over 40 minutes of  exam, counseling, chart review and high complex critical decision making was performed.   Kirtland Bouchard, MD

## 2022-05-07 ENCOUNTER — Encounter: Payer: Self-pay | Admitting: Internal Medicine

## 2022-05-07 ENCOUNTER — Ambulatory Visit (INDEPENDENT_AMBULATORY_CARE_PROVIDER_SITE_OTHER): Payer: Medicare Other | Admitting: Internal Medicine

## 2022-05-07 VITALS — BP 130/70 | HR 76 | Temp 97.9°F | Resp 17 | Ht 65.0 in | Wt 161.2 lb

## 2022-05-07 DIAGNOSIS — Z0000 Encounter for general adult medical examination without abnormal findings: Secondary | ICD-10-CM | POA: Diagnosis not present

## 2022-05-07 DIAGNOSIS — Z0001 Encounter for general adult medical examination with abnormal findings: Secondary | ICD-10-CM

## 2022-05-07 DIAGNOSIS — G4733 Obstructive sleep apnea (adult) (pediatric): Secondary | ICD-10-CM

## 2022-05-07 DIAGNOSIS — R7309 Other abnormal glucose: Secondary | ICD-10-CM

## 2022-05-07 DIAGNOSIS — Z136 Encounter for screening for cardiovascular disorders: Secondary | ICD-10-CM

## 2022-05-07 DIAGNOSIS — Z23 Encounter for immunization: Secondary | ICD-10-CM

## 2022-05-07 DIAGNOSIS — I700 Atherosclerosis of aorta: Secondary | ICD-10-CM

## 2022-05-07 DIAGNOSIS — I10 Essential (primary) hypertension: Secondary | ICD-10-CM | POA: Diagnosis not present

## 2022-05-07 DIAGNOSIS — Z789 Other specified health status: Secondary | ICD-10-CM

## 2022-05-07 DIAGNOSIS — Z8249 Family history of ischemic heart disease and other diseases of the circulatory system: Secondary | ICD-10-CM

## 2022-05-07 DIAGNOSIS — E559 Vitamin D deficiency, unspecified: Secondary | ICD-10-CM | POA: Diagnosis not present

## 2022-05-07 DIAGNOSIS — Z79899 Other long term (current) drug therapy: Secondary | ICD-10-CM | POA: Diagnosis not present

## 2022-05-07 DIAGNOSIS — Z1211 Encounter for screening for malignant neoplasm of colon: Secondary | ICD-10-CM

## 2022-05-07 DIAGNOSIS — I2510 Atherosclerotic heart disease of native coronary artery without angina pectoris: Secondary | ICD-10-CM

## 2022-05-07 DIAGNOSIS — J432 Centrilobular emphysema: Secondary | ICD-10-CM

## 2022-05-07 DIAGNOSIS — E782 Mixed hyperlipidemia: Secondary | ICD-10-CM

## 2022-05-07 DIAGNOSIS — Z87891 Personal history of nicotine dependence: Secondary | ICD-10-CM

## 2022-05-07 DIAGNOSIS — K219 Gastro-esophageal reflux disease without esophagitis: Secondary | ICD-10-CM

## 2022-05-08 LAB — COMPLETE METABOLIC PANEL WITH GFR
AG Ratio: 1.6 (calc) (ref 1.0–2.5)
ALT: 9 U/L (ref 6–29)
AST: 16 U/L (ref 10–35)
Albumin: 3.9 g/dL (ref 3.6–5.1)
Alkaline phosphatase (APISO): 66 U/L (ref 37–153)
BUN: 17 mg/dL (ref 7–25)
CO2: 28 mmol/L (ref 20–32)
Calcium: 9.3 mg/dL (ref 8.6–10.4)
Chloride: 105 mmol/L (ref 98–110)
Creat: 0.77 mg/dL (ref 0.60–1.00)
Globulin: 2.4 g/dL (calc) (ref 1.9–3.7)
Glucose, Bld: 82 mg/dL (ref 65–99)
Potassium: 4 mmol/L (ref 3.5–5.3)
Sodium: 141 mmol/L (ref 135–146)
Total Bilirubin: 0.4 mg/dL (ref 0.2–1.2)
Total Protein: 6.3 g/dL (ref 6.1–8.1)
eGFR: 82 mL/min/{1.73_m2} (ref >=60)

## 2022-05-08 LAB — CBC WITH DIFFERENTIAL/PLATELET
Absolute Monocytes: 460 cells/uL (ref 200–950)
Basophils Absolute: 41 cells/uL (ref 0–200)
Basophils Relative: 0.7 %
Eosinophils Absolute: 118 cells/uL (ref 15–500)
Eosinophils Relative: 2 %
HCT: 37.2 % (ref 35.0–45.0)
Hemoglobin: 12.7 g/dL (ref 11.7–15.5)
Lymphs Abs: 2331 cells/uL (ref 850–3900)
MCH: 27.7 pg (ref 27.0–33.0)
MCHC: 34.1 g/dL (ref 32.0–36.0)
MCV: 81.2 fL (ref 80.0–100.0)
MPV: 11 fL (ref 7.5–12.5)
Monocytes Relative: 7.8 %
Neutro Abs: 2950 cells/uL (ref 1500–7800)
Neutrophils Relative %: 50 %
Platelets: 237 10*3/uL (ref 140–400)
RBC: 4.58 10*6/uL (ref 3.80–5.10)
RDW: 14.2 % (ref 11.0–15.0)
Total Lymphocyte: 39.5 %
WBC: 5.9 10*3/uL (ref 3.8–10.8)

## 2022-05-08 LAB — LIPID PANEL
Cholesterol: 187 mg/dL (ref <200)
HDL: 78 mg/dL (ref >=50)
LDL Cholesterol (Calc): 87 mg/dL (calc)
Non-HDL Cholesterol (Calc): 109 mg/dL (calc) (ref <130)
Total CHOL/HDL Ratio: 2.4 (calc) (ref <5.0)
Triglycerides: 127 mg/dL (ref <150)

## 2022-05-08 LAB — URINALYSIS, ROUTINE W REFLEX MICROSCOPIC
Bilirubin Urine: NEGATIVE
Glucose, UA: NEGATIVE
Hgb urine dipstick: NEGATIVE
Ketones, ur: NEGATIVE
Leukocytes,Ua: NEGATIVE
Nitrite: NEGATIVE
Protein, ur: NEGATIVE
Specific Gravity, Urine: 1.014 (ref 1.001–1.035)
pH: 6.5 (ref 5.0–8.0)

## 2022-05-08 LAB — TSH: TSH: 0.77 mIU/L (ref 0.40–4.50)

## 2022-05-08 LAB — INSULIN, RANDOM: Insulin: 5 u[IU]/mL

## 2022-05-08 LAB — MICROALBUMIN / CREATININE URINE RATIO
Creatinine, Urine: 100 mg/dL (ref 20–275)
Microalb Creat Ratio: 14 mcg/mg creat (ref <30)
Microalb, Ur: 1.4 mg/dL

## 2022-05-08 LAB — VITAMIN D 25 HYDROXY (VIT D DEFICIENCY, FRACTURES): Vit D, 25-Hydroxy: 70 ng/mL (ref 30–100)

## 2022-05-08 LAB — HEMOGLOBIN A1C
Hgb A1c MFr Bld: 5.8 % of total Hgb — ABNORMAL HIGH (ref <5.7)
Mean Plasma Glucose: 120 mg/dL
eAG (mmol/L): 6.6 mmol/L

## 2022-05-08 LAB — MAGNESIUM: Magnesium: 1.9 mg/dL (ref 1.5–2.5)

## 2022-05-09 NOTE — Progress Notes (Signed)
<><><><><><><><><><><><><><><><><><><><><><><><><><><><><><><><><> <><><><><><><><><><><><><><><><><><><><><><><><><><><><><><><><><> -   Test results slightly outside the reference range are not unusual. If there is anything important, I will review this with you,  otherwise it is considered normal test values.  If you have further questions,  please do not hesitate to contact me at the office or via My Chart.  <><><><><><><><><><><><><><><><><><><><><><><><><><><><><><><><><> <><><><><><><><><><><><><><><><><><><><><><><><><><><><><><><><><>  -  A1c - the 12 week average Blood sugar has gone up to 5.8%  and                   is elevated in the borderline and early or pre-diabetes range which has the same   300% increased risk for heart attack, stroke, cancer and   alzheimer- type vascular dementia as full blown diabetes.   But the good news is that diet, exercise with weight loss can                                                                     cure the early diabetes at this point.  -  It is very important that you work harder with diet by                     avoiding all foods that are white except chicken, fish & calliflower.  - Avoid white rice  (brown & wild rice is OK),   - Avoid white potatoes  (sweet potatoes in moderation is OK),   White bread or wheat bread or anything made out of   white flour like bagels, donuts, rolls, buns, biscuits, cakes,  - pastries, cookies, pizza crust, and pasta (made from  white flour & egg whites)   - vegetarian pasta or spinach or wheat pasta is OK.  - Multigrain breads like Arnold's, Pepperidge Farm or                                                     multi grain sandwich thins or high fiber breads like    [place] bread or "Dave's Killer" breads that are 4 to 5 grams fiber per slice !  are best.     Diet, exercise and weight loss can reverse and cure diabetes in the early stages.    <><><><><><><><><><><><><><><><><><><><><><><><><><><><><><><><><>  -  Total Chol = 187   &   LD Chol = 87   - Both  Excellent   - Very low risk for Heart Attack  / Stroke <><><><><><><><><><><><><><><><><><><><><><><><><><><><><><><><><>  -   Vitamin D = 70  -    Excellent   - Please keep dosing same  <><><><><><><><><><><><><><><><><><><><><><><><><><><><><><><><><>  -  All Else - CBC - Kidneys - Electrolytes - Liver - Magnesium & Thyroid    - all  Normal / OK <><><><><><><><><><><><><><><><><><><><><><><><><><><><><><><><><> <><><><><><><><><><><><><><><><><><><><><><><><><><><><><><><><><>  -  Keep up the [place] Work  !  <><><><><><><><><><><><><><><><><><><><><><><><><><><><><><><><><> <><><><><><><><><><><><><><><><><><><><><><><><><><><><><><><><><>

## 2022-05-11 ENCOUNTER — Other Ambulatory Visit: Payer: Self-pay | Admitting: Nurse Practitioner

## 2022-05-11 DIAGNOSIS — F17200 Nicotine dependence, unspecified, uncomplicated: Secondary | ICD-10-CM

## 2022-05-12 ENCOUNTER — Other Ambulatory Visit: Payer: Self-pay | Admitting: Internal Medicine

## 2022-05-12 ENCOUNTER — Other Ambulatory Visit: Payer: Self-pay | Admitting: Cardiology

## 2022-05-12 DIAGNOSIS — R1013 Epigastric pain: Secondary | ICD-10-CM

## 2022-05-12 DIAGNOSIS — R079 Chest pain, unspecified: Secondary | ICD-10-CM

## 2022-05-12 DIAGNOSIS — F330 Major depressive disorder, recurrent, mild: Secondary | ICD-10-CM

## 2022-05-18 DIAGNOSIS — R519 Headache, unspecified: Secondary | ICD-10-CM | POA: Diagnosis not present

## 2022-05-18 DIAGNOSIS — J342 Deviated nasal septum: Secondary | ICD-10-CM | POA: Diagnosis not present

## 2022-05-18 DIAGNOSIS — R0982 Postnasal drip: Secondary | ICD-10-CM | POA: Diagnosis not present

## 2022-05-18 DIAGNOSIS — R0981 Nasal congestion: Secondary | ICD-10-CM | POA: Diagnosis not present

## 2022-05-18 DIAGNOSIS — J329 Chronic sinusitis, unspecified: Secondary | ICD-10-CM | POA: Diagnosis not present

## 2022-06-13 ENCOUNTER — Other Ambulatory Visit: Payer: Self-pay | Admitting: Nurse Practitioner

## 2022-06-13 DIAGNOSIS — M2550 Pain in unspecified joint: Secondary | ICD-10-CM

## 2022-07-02 ENCOUNTER — Encounter: Payer: Self-pay | Admitting: Internal Medicine

## 2022-07-17 ENCOUNTER — Other Ambulatory Visit: Payer: Self-pay | Admitting: Nurse Practitioner

## 2022-07-17 ENCOUNTER — Telehealth: Payer: Self-pay

## 2022-07-17 DIAGNOSIS — F5101 Primary insomnia: Secondary | ICD-10-CM

## 2022-07-17 MED ORDER — CLONAZEPAM 0.5 MG PO TABS: ORAL_TABLET | ORAL | 0 refills | Status: DC

## 2022-07-17 NOTE — Telephone Encounter (Signed)
Refill request for Clonazepam.  

## 2022-08-02 ENCOUNTER — Other Ambulatory Visit (HOSPITAL_COMMUNITY): Payer: Self-pay

## 2022-08-08 DIAGNOSIS — J3089 Other allergic rhinitis: Secondary | ICD-10-CM | POA: Diagnosis not present

## 2022-08-08 DIAGNOSIS — R052 Subacute cough: Secondary | ICD-10-CM | POA: Diagnosis not present

## 2022-08-08 DIAGNOSIS — J301 Allergic rhinitis due to pollen: Secondary | ICD-10-CM | POA: Diagnosis not present

## 2022-08-08 DIAGNOSIS — T781XXD Other adverse food reactions, not elsewhere classified, subsequent encounter: Secondary | ICD-10-CM | POA: Diagnosis not present

## 2022-08-09 ENCOUNTER — Ambulatory Visit: Payer: Medicare Other | Admitting: Nurse Practitioner

## 2022-08-10 ENCOUNTER — Other Ambulatory Visit: Payer: Self-pay | Admitting: Cardiology

## 2022-08-10 ENCOUNTER — Telehealth: Payer: Self-pay | Admitting: Cardiology

## 2022-08-10 NOTE — Telephone Encounter (Signed)
Called [facility]. Per pharm tech, looks like medication is covered under part D but not secondary plan. Med will cost $10 for 60 day supply.  Patient aware of this info

## 2022-08-10 NOTE — Telephone Encounter (Signed)
Pt c/o medication issue:  1. Name of Medication: bisoprolol-hydrochlorothiazide (ZIAC) 10-6.25 MG tablet  2. How are you currently taking this medication (dosage and times per day)? Half a tablet daily  3. Are you having a reaction (difficulty breathing--STAT)? no  4. What is your medication issue? Patient states her insurance changed and they will not cover it. She would like to know if there is another medication she can be prescribed.

## 2022-08-14 ENCOUNTER — Other Ambulatory Visit: Payer: Self-pay | Admitting: Nurse Practitioner

## 2022-08-14 DIAGNOSIS — J3089 Other allergic rhinitis: Secondary | ICD-10-CM | POA: Diagnosis not present

## 2022-08-14 DIAGNOSIS — J301 Allergic rhinitis due to pollen: Secondary | ICD-10-CM | POA: Diagnosis not present

## 2022-08-14 DIAGNOSIS — M2550 Pain in unspecified joint: Secondary | ICD-10-CM

## 2022-08-15 NOTE — Progress Notes (Signed)
MEDICARE ANNUAL WELLNESS VISIT AND FOLLOW UP  Assessment:   Diagnoses and all orders for this visit:  Encounter for Medicare annual wellness exam Due Annually Health maintenance reviewed  Essential hypertension Controlled  Continue medications;  Discussed DASH (Dietary Approaches to Stop Hypertension) DASH diet is lower in sodium than a typical American diet. Cut back on foods that are high in saturated fat, cholesterol, and trans fats. Eat more whole-grain foods, fish, poultry, and nuts Remain active and exercise as tolerated daily.  Monitor BP at home-Call if greater than 130/80.    Aortic atherosclerosis by Abd CT scan in 2018  [facility]) Per CT 05/2019 Discussed lifestyle modifications. Recommended diet heavy in fruits and veggies, omega 3's. Decrease consumption of animal meats, cheeses, and dairy products. Remain active and exercise as tolerated. Continue to monitor.  Centrilobular emphysema ([place]) Continues to smoke Continue Wellbutrin. Continue to follow with Pulmonology Last seen 12/27/21 She had a recent LDCT for lung cancer screening, Lung RADS 4A, 10/2021. Due again 12/2021 d/t nodules. Smoking cessation instruction/counseling given:  counseled patient on the dangers of tobacco use, advised patient to stop smoking, and reviewed strategies to maximize success  OSA (obstructive sleep apnea) Does not use CPAP - unable to tolerate. Increase risk for untreated OSA discussed. Continue to follow up with Pulmonology Continue to monitor  Lung nodules Lung-RADS 4A, suspicious. Follow up low-dose chest CT without contrast in 3 months (July 2023) is recommended. Alternatively, PET may be considered when there is a solid component 40m or larger.  Pulmonary nodules measure up to 7.0 mm in the posterior segment right upper lobe. Pulmonology following   Gastroesophageal reflux disease, unspecified whether esophagitis present Controlled  No suspected reflux complications  ([place]/stricture). Lifestyle modification:  wt loss, avoid meals 2-3h before bedtime. Consider eliminating food triggers:  chocolate, caffeine, EtOH, acid/spicy food.  Hyperlipidemia, mixed Controlled Continue medications;  Discussed lifestyle modifications. Recommended diet heavy in fruits and veggies, omega 3's. Decrease consumption of animal meats, cheeses, and dairy products. Remain active and exercise as tolerated. Continue to monitor.  Vitamin D deficiency Controlled. Continue supplement  Recurrent mild major depressive disorder with anxiety ([place] Controlled. Continue Wellbutrin, Klonopin PRN, rare use of Benzo. Continue to monitor  Medication management All medications discussed and reviewed in full. All questions and concerns regarding medications addressed.    Orders Placed This Encounter  Procedures   CBC with Differential/Platelet   COMPLETE METABOLIC PANEL WITH GFR   Lipid panel   VITAMIN D 25 Hydroxy (Vit-D Deficiency, Fractures)    Notify office for further evaluation and treatment, questions or concerns if any reported s/s fail to improve.   The patient was advised to call back or seek an in-person evaluation if any symptoms worsen or if the condition fails to improve as anticipated.   Further disposition pending results of labs. Discussed med's effects and SE's.    I discussed the assessment and treatment plan with the patient. The patient was provided an opportunity to ask questions and all were answered. The patient agreed with the plan and demonstrated an understanding of the instructions.  Discussed med's effects and SE's. Screening labs and tests as requested with regular follow-up as recommended.  I provided 30 minutes of face-to-face time during this encounter including counseling, chart review, and critical decision making was preformed.  Future Appointments  Date Time Provider [place] 11/20/2022  2:30 PM MUnk Pinto MD  [facility] None  05/13/2023  3:00 PM MUnk Pinto MD [facility] None  11/13/2023  11:00 AM Darrol Jump, NP [facility] None     Plan:   During the course of the visit the patient was educated and counseled about appropriate screening and preventive services including:   Pneumococcal vaccine  Prevnar 13 Influenza vaccine Td vaccine Screening electrocardiogram Bone densitometry screening Colorectal cancer screening Diabetes screening Glaucoma screening Nutrition counseling  Advanced directives: requested   Subjective:  [name] is a 72 y.o. female who presents for Medicare Annual Wellness Visit and 3 month follow up.   She is married and lives with her husband  She had a updated Lung CT scan 10/2021 which revealed new lung nodules.  It was suggested that she f/u with a re-screen in 3 months, 01/2022.  Continues to smoke.  Continues Wellbutrin.  Helps more with mood.  She had sleep study in 2019 and newly diagnosed OSA.  She is unable to tolerate the CPAP, will follow up with [place].   she has a diagnosis of anxiety and is currently on clonazepam 0.25 - 0.5 mg, along with Wellbutrin for smoking cessation.  Reports symptoms are fairly controlled on current regimen.  she has a diagnosis of GERD which is currently managed by omeprazole 40 mg daily, carafate PRN; had recent EGD in 04/2019 which was unremarkable excepting inflammation.   she reports symptoms is currently well controlled, and denies breakthrough reflux, burning in chest; she does have multifactorial chronic hoarseness and smoker's cough.   BMI is Body mass index is 26.46 kg/m., she has been working on diet and exercise. Wt Readings from Last 3 Encounters:  08/16/22 159 lb (72.1 kg)  05/07/22 161 lb 3.2 oz (73.1 kg)  05/02/22 162 lb 6.4 oz (73.7 kg)   She follows with Dr. Stanford Breed; Carotid Dopplers July 2013 showed less than 50% bilateral stenosis. Echocardiogram September 2018 showed normal LV  function, trace aortic insufficiency and mild left atrial enlargement. Stress echocardiogram October 2018 showed no stress-induced wall motion abnormalities. She has CAD and aortic atherosclerosis by CT 05/2019.    Her blood pressure has been controlled at home, today their BP is BP: (!) 140/68 She does not workout. She denies chest pain, shortness of breath, dizziness.   She is on cholesterol medication (she is on praluent injectible via cardiology, had allergic reaction repatha) and denies myalgias. Her cholesterol is at goal. The cholesterol last visit was:   Lab Results  Component Value Date   CHOL 188 08/16/2022   HDL 86 08/16/2022   LDLCALC 76 08/16/2022   TRIG 154 (H) 08/16/2022   CHOLHDL 2.2 08/16/2022    She has been working on diet and exercise for glucose management, and denies foot ulcerations, increased appetite, nausea, paresthesia of the feet, polydipsia, polyuria, visual disturbances, vomiting and weight loss. Last A1C in the office was:  Lab Results  Component Value Date   HGBA1C 5.8 (H) 05/07/2022   Last GFR: Lab Results  Component Value Date   GFRNONAA 76 09/02/2020   Patient is on Vitamin D supplement and at recent check:   Lab Results  Component Value Date   VD25OH 63 08/16/2022      Medication Review: Current Outpatient Medications on File Prior to Visit  Medication Sig Dispense Refill   albuterol (VENTOLIN HFA) 108 (90 Base) MCG/ACT inhaler Inhale 2 puffs into the lungs every 6 (six) hours as needed for wheezing or shortness of breath. 8 g 2   Ascorbic Acid (VITAMIN C) 1000 MG tablet Take 1,000 mg by mouth daily.  aspirin 81 MG tablet Take 81 mg by mouth every other day.      bisoprolol-hydrochlorothiazide (ZIAC) 10-6.25 MG tablet TAKE 1/2 TABLET BY MOUTH DAILY 30 tablet 0   buPROPion (WELLBUTRIN XL) 150 MG 24 hr tablet Take  1 tablet  2 x /day  for Mood, Focus & Concentration                             /                                                                      TAKE                                  BY                                           MOUTH 180 tablet 3   cetirizine (ZYRTEC) 10 MG tablet Take 10 mg by mouth daily.     Cholecalciferol (VITAMIN D3) 5000 UNITS TABS Take 5,000 Units by mouth daily.     clonazePAM (KLONOPIN) 0.5 MG tablet TAKE 1/2 TO 1 TABLET BY MOUTH ONCE TO TWICE DAILY ONLY IF NEEDED FOR ANXIETY ATTACK AND LIMIT TO 5 DAYS PER WEEK TO AVOID ADDICTION AND DEMENTIA 60 tablet 0   clopidogrel (PLAVIX) 75 MG tablet Take  1 tablet  Daily  to Prevent Blood Clots 90 tablet 0   Cyanocobalamin (VITAMIN B12 PO) Take 1,000 mcg by mouth daily.     fluticasone (FLONASE) 50 MCG/ACT nasal spray 1 Spray each nostril daily 48 g 1   losartan (COZAAR) 100 MG tablet Take  1 tablet Daily for BP                                          /                      TAKE                          BY                                MOUTH 90 tablet 3   meloxicam (MOBIC) 7.5 MG tablet TAKE 1 TABLET(7.5 MG) BY MOUTH DAILY AS NEEDED FOR ARTHRITIS PAIN 30 tablet 1   methylcellulose oral powder Take by mouth daily.      montelukast (SINGULAIR) 10 MG tablet Take 1 tablet (10 mg total) by mouth at bedtime. 30 tablet 11   Multiple Vitamins-Minerals (MULTI COMPLETE PO) Take 1 capsule by mouth daily.     NIFEdipine (PROCARDIA XL/NIFEDICAL XL) 60 MG 24 hr tablet Take 1 tablet (60 mg total) by mouth daily. 120 tablet 0   Omega-3 Fatty Acids (FISH OIL) 1000 MG CAPS Take 1 capsule (1,000  mg total) by mouth 2 (two) times daily with a meal. 60 capsule 11   omeprazole (PRILOSEC) 40 MG capsule Take  1 capsule  2 x /day to Prevent Heartburn & Indigestion                          /                                              TAKE                                             BY                                     MOUTH 180 capsule 3   PRALUENT 150 MG/ML SOAJ INJECT 150 MG INTO THE SKIN EVERY 14 DAYS 2 mL 11   Tiotropium Bromide-Olodaterol (STIOLTO RESPIMAT) 2.5-2.5 MCG/ACT  AERS Inhale 2 puffs into the lungs daily. 4 g 5   Zinc 50 MG TABS Take by mouth daily.     Current Facility-Administered Medications on File Prior to Visit  Medication Dose Route Frequency Provider Last Rate Last Admin   ipratropium-albuterol (DUONEB) 0.5-2.5 (3) MG/3ML nebulizer solution 3 mL  3 mL Nebulization Once Jamiah Recore, NP        Allergies  Allergen Reactions   Enalapril Swelling    Caused lips to swell and bloating.   Statins     Tingling with several statins   Repatha [Evolocumab]     Stated she didn't tolerate it and switched to Praluent    Current Problems (verified) Patient Active Problem List   Diagnosis Date Noted   Cough 05/02/2022   Centrilobular emphysema ([place]) 12/27/2021   Tobacco abuse 12/27/2021   Lung nodules 12/27/2021   Palpitations 12/07/2020   3-vessel CAD 09/15/2020   Screening for ischemic heart disease 09/02/2020   FHx: heart disease 09/02/2020   Allergic rhinitis 01/06/2020   Statin Intolerance 12/12/2019   OSA (obstructive sleep apnea) 02/26/2018   COPD GOLD A-B 12/02/2017   Recurrent mild major depressive disorder with anxiety ([place]) 10/22/2017   Former smoker 10/22/2017   Aortic atherosclerosis by Abd CT scan in 2018  ([place]) 05/16/2017   GERD (gastroesophageal reflux disease) 04/10/2017   Other abnormal glucose 03/20/2014   Essential hypertension 06/15/2013   Hyperlipidemia, mixed 06/15/2013   Vitamin D deficiency 06/15/2013    Screening Tests Immunization History  Administered Date(s) Administered   Influenza, High Dose Seasonal PF 04/10/2017, 05/08/2019, 05/24/2020, 04/24/2021, 05/07/2022   Influenza-Unspecified 03/05/2018   PFIZER(Purple Top)SARS-COV-2 Vaccination 08/13/2019, 09/09/2019   PPD Test 09/21/2014   Pneumococcal Conjugate-13 10/22/2017   Pneumococcal Polysaccharide-23 02/07/2011, 01/26/2016   Tdap 02/07/2011   Preventative care: Last colonoscopy: 2015, 10 year follow up EGD: 2020 Mammogram: 06/2022  , at  Obgyn, Due Pelvic exam: at [facility], never abnormal, sees [facility], last 10/2021 Dexa: 06/2023 normal per patient   CT chest smoker: 10/2021 - Due 01/2022 for presence of new lung nodules  Prior vaccinations: TD or Tdap: 2012  Influenza: 04/2022 Pneumococcal: 2012, 2017 Prevnar13: 2019 Shingrix: Received 1st/ Plans to get  Names of Other Physician/Practitioners you currently use: 1. [facility] here for primary care 2. [facility] doctor, last visit 2020, has mild R cataract - Refer - reading glasses.  Has had left cataract removed  3. [place], 07/2022  Patient Care Team: Unk Pinto, MD as PCP - General (Internal Medicine) Lelon Perla, MD as Consulting Physician (Cardiology)  SURGICAL HISTORY She  has a past surgical history that includes Appendectomy; Partial hysterectomy; Lumbar fusion (02/06/2012); Back surgery; Dilation and curettage of uterus; Total hip arthroplasty (06/02/2012); Total knee arthroplasty (Right, 12/15/2012); Total hip arthroplasty (Left, 01/25/2016); Total hip arthroplasty (Left, 01/25/2016); Total knee arthroplasty (Left, 06/18/2016); Colonoscopy; Upper gastrointestinal endoscopy (05/06/2019); Polypectomy; and Cataract extraction (Left, 2019). FAMILY HISTORY Her family history includes CAD in her father; Cancer in her maternal grandmother; Colon cancer (age of onset: 98) in her sister; Colon cancer (age of onset: 61) in her mother; Diabetes in her mother; Hypertension in her brother, brother, and father; Lung cancer (age of onset: 2) in her brother; Peripheral Artery Disease in her paternal grandfather. SOCIAL HISTORY She  reports that she has been smoking cigarettes. She started smoking about 39 years ago. She has a 35.00 pack-year smoking history. She has never used smokeless tobacco. She reports current alcohol use. She reports current drug use. Drug: Marijuana.   MEDICARE WELLNESS  OBJECTIVES: Physical activity:   Cardiac risk factors:   Depression/mood screen:      08/16/2022    3:56 PM  Depression screen PHQ 2/9  Decreased Interest 0  Down, Depressed, Hopeless 0  PHQ - 2 Score 0    ADLs:     08/16/2022    3:53 PM 01/29/2022   12:29 PM  In your present state of health, do you have any difficulty performing the following activities:  Hearing? 0 0  Vision? 1 0  Difficulty concentrating or making decisions? 0 0  Walking or climbing stairs? 0 0  Dressing or bathing? 0 0  Doing errands, shopping? 0 0  Preparing Food and eating ? N N  Using the Toilet? N N  In the past six months, have you accidently leaked urine? N N  Do you have problems with loss of bowel control? N N  Managing your Medications? N N  Managing your Finances? N N  Housekeeping or managing your Housekeeping? N N     Cognitive Testing  Alert? Yes  Normal Appearance?Yes  Oriented to person? Yes  Place? Yes   Time? Yes  Recall of three objects?  Yes  Can perform simple calculations? Yes  Displays appropriate judgment?Yes  Can read the correct time from a watch face?Yes  EOL planning: Does Patient Have a Medical Advance Directive?: No  Review of Systems  Constitutional:  Negative for malaise/fatigue and weight loss.  HENT:  Negative for congestion, hearing loss, sore throat and tinnitus.   Eyes:  Negative for blurred vision and double vision.  Respiratory:  Positive for cough (chronic) and sputum production. Negative for shortness of breath and wheezing.   Cardiovascular:  Negative for chest pain, palpitations, orthopnea, claudication, leg swelling and PND.  Gastrointestinal:  Negative for abdominal pain, blood in stool, constipation, diarrhea, heartburn, melena, nausea and vomiting.  Genitourinary: Negative.   Musculoskeletal:  Negative for falls, joint pain and myalgias.  Skin:  Negative for rash.  Neurological:  Negative for dizziness, tingling, sensory change, weakness and headaches.   Endo/Heme/Allergies:  Negative for environmental allergies and polydipsia.  Psychiatric/Behavioral: Negative.  Negative for depression, memory loss, substance abuse and suicidal ideas. The patient is not nervous/anxious and does not have insomnia.   All other systems reviewed and are negative.    Objective:     Today's Vitals   08/16/22 1509  BP: (!) 140/68  Pulse: 71  Temp: (!) 97.5 F (36.4 C)  SpO2: 99%  Weight: 159 lb (72.1 kg)  Height: 5' 5"$  (1.651 m)   Body mass index is 26.46 kg/m.  General appearance: alert, no distress, WD/WN, female HEENT: normocephalic, sclerae anicteric, TMs pearly, nares patent, no discharge or erythema, pharynx normal Oral cavity: MMM, no lesions Neck: supple, no lymphadenopathy, no thyromegaly, no masses Heart: RRR, normal S1, S2, no murmurs Lungs: Scattered rhonchi to bilateral bases without wheezes or rales Abdomen: +bs, soft, non tender, non distended, no masses, no hepatomegaly, no splenomegaly Musculoskeletal: nontender, no swelling, no obvious deformity Extremities: no edema, no cyanosis, no clubbing Pulses: 2+ symmetric, upper and lower extremities, normal cap refill Neurological: alert, oriented x 3, CN2-12 intact, strength normal upper extremities and lower extremities, sensation normal throughout, DTRs 2+ throughout, no cerebellar signs, gait normal Psychiatric: normal affect, behavior normal, pleasant   Medicare Attestation I have personally reviewed: The patient's medical and social history Their use of alcohol, tobacco or illicit drugs Their current medications and supplements The patient's functional ability including ADLs,fall risks, home safety risks, cognitive, and hearing and visual impairment Diet and physical activities Evidence for depression or mood disorders  The patient's weight, height, BMI, and visual acuity have been recorded in the chart.  I have made referrals, counseling, and provided education to the patient  based on review of the above and I have provided the patient with a written personalized care plan for preventive services.     Darrol Jump, NP   08/26/2022

## 2022-08-16 ENCOUNTER — Ambulatory Visit (INDEPENDENT_AMBULATORY_CARE_PROVIDER_SITE_OTHER): Payer: Medicare Other | Admitting: Nurse Practitioner

## 2022-08-16 ENCOUNTER — Other Ambulatory Visit: Payer: Self-pay

## 2022-08-16 ENCOUNTER — Encounter: Payer: Self-pay | Admitting: Nurse Practitioner

## 2022-08-16 VITALS — BP 140/68 | HR 71 | Temp 97.5°F | Ht 65.0 in | Wt 159.0 lb

## 2022-08-16 DIAGNOSIS — F419 Anxiety disorder, unspecified: Secondary | ICD-10-CM

## 2022-08-16 DIAGNOSIS — I700 Atherosclerosis of aorta: Secondary | ICD-10-CM | POA: Diagnosis not present

## 2022-08-16 DIAGNOSIS — R918 Other nonspecific abnormal finding of lung field: Secondary | ICD-10-CM

## 2022-08-16 DIAGNOSIS — I10 Essential (primary) hypertension: Secondary | ICD-10-CM | POA: Diagnosis not present

## 2022-08-16 DIAGNOSIS — R6889 Other general symptoms and signs: Secondary | ICD-10-CM

## 2022-08-16 DIAGNOSIS — Z0001 Encounter for general adult medical examination with abnormal findings: Secondary | ICD-10-CM

## 2022-08-16 DIAGNOSIS — Z79899 Other long term (current) drug therapy: Secondary | ICD-10-CM | POA: Diagnosis not present

## 2022-08-16 DIAGNOSIS — K219 Gastro-esophageal reflux disease without esophagitis: Secondary | ICD-10-CM | POA: Diagnosis not present

## 2022-08-16 DIAGNOSIS — E782 Mixed hyperlipidemia: Secondary | ICD-10-CM | POA: Diagnosis not present

## 2022-08-16 DIAGNOSIS — F330 Major depressive disorder, recurrent, mild: Secondary | ICD-10-CM

## 2022-08-16 DIAGNOSIS — G4733 Obstructive sleep apnea (adult) (pediatric): Secondary | ICD-10-CM

## 2022-08-16 DIAGNOSIS — J432 Centrilobular emphysema: Secondary | ICD-10-CM | POA: Diagnosis not present

## 2022-08-16 DIAGNOSIS — Z0000 Encounter for general adult medical examination without abnormal findings: Secondary | ICD-10-CM

## 2022-08-16 DIAGNOSIS — E559 Vitamin D deficiency, unspecified: Secondary | ICD-10-CM | POA: Diagnosis not present

## 2022-08-16 NOTE — Patient Instructions (Signed)

## 2022-08-17 LAB — CBC WITH DIFFERENTIAL/PLATELET
Absolute Monocytes: 563 cells/uL (ref 200–950)
Basophils Absolute: 40 cells/uL (ref 0–200)
Basophils Relative: 0.6 %
Eosinophils Absolute: 328 cells/uL (ref 15–500)
Eosinophils Relative: 4.9 %
HCT: 37 % (ref 35.0–45.0)
Hemoglobin: 12.7 g/dL (ref 11.7–15.5)
Lymphs Abs: 2211 cells/uL (ref 850–3900)
MCH: 27.4 pg (ref 27.0–33.0)
MCHC: 34.3 g/dL (ref 32.0–36.0)
MCV: 79.7 fL — ABNORMAL LOW (ref 80.0–100.0)
MPV: 10.8 fL (ref 7.5–12.5)
Monocytes Relative: 8.4 %
Neutro Abs: 3558 cells/uL (ref 1500–7800)
Neutrophils Relative %: 53.1 %
Platelets: 289 10*3/uL (ref 140–400)
RBC: 4.64 10*6/uL (ref 3.80–5.10)
RDW: 14.1 % (ref 11.0–15.0)
Total Lymphocyte: 33 %
WBC: 6.7 10*3/uL (ref 3.8–10.8)

## 2022-08-17 LAB — COMPLETE METABOLIC PANEL WITHOUT GFR
AG Ratio: 1.4 (calc) (ref 1.0–2.5)
ALT: 12 U/L (ref 6–29)
AST: 17 U/L (ref 10–35)
Albumin: 4.2 g/dL (ref 3.6–5.1)
Alkaline phosphatase (APISO): 75 U/L (ref 37–153)
BUN/Creatinine Ratio: 20 (calc) (ref 6–22)
BUN: 21 mg/dL (ref 7–25)
CO2: 28 mmol/L (ref 20–32)
Calcium: 9.8 mg/dL (ref 8.6–10.4)
Chloride: 106 mmol/L (ref 98–110)
Creat: 1.04 mg/dL — ABNORMAL HIGH (ref 0.60–1.00)
Globulin: 2.9 g/dL (ref 1.9–3.7)
Glucose, Bld: 95 mg/dL (ref 65–99)
Potassium: 4.5 mmol/L (ref 3.5–5.3)
Sodium: 141 mmol/L (ref 135–146)
Total Bilirubin: 0.5 mg/dL (ref 0.2–1.2)
Total Protein: 7.1 g/dL (ref 6.1–8.1)
eGFR: 57 mL/min/1.73m2 — ABNORMAL LOW

## 2022-08-17 LAB — LIPID PANEL
Cholesterol: 188 mg/dL (ref <200)
HDL: 86 mg/dL (ref >=50)
LDL Cholesterol (Calc): 76 mg/dL (calc)
Non-HDL Cholesterol (Calc): 102 mg/dL (calc) (ref <130)
Total CHOL/HDL Ratio: 2.2 (calc) (ref <5.0)
Triglycerides: 154 mg/dL — ABNORMAL HIGH (ref <150)

## 2022-08-17 LAB — VITAMIN D 25 HYDROXY (VIT D DEFICIENCY, FRACTURES): Vit D, 25-Hydroxy: 63 ng/mL (ref 30–100)

## 2022-08-21 DIAGNOSIS — J3089 Other allergic rhinitis: Secondary | ICD-10-CM | POA: Diagnosis not present

## 2022-08-27 DIAGNOSIS — J301 Allergic rhinitis due to pollen: Secondary | ICD-10-CM | POA: Diagnosis not present

## 2022-08-27 DIAGNOSIS — J3081 Allergic rhinitis due to animal (cat) (dog) hair and dander: Secondary | ICD-10-CM | POA: Diagnosis not present

## 2022-08-27 DIAGNOSIS — J3089 Other allergic rhinitis: Secondary | ICD-10-CM | POA: Diagnosis not present

## 2022-09-03 ENCOUNTER — Other Ambulatory Visit: Payer: Self-pay

## 2022-09-03 DIAGNOSIS — J301 Allergic rhinitis due to pollen: Secondary | ICD-10-CM | POA: Diagnosis not present

## 2022-09-03 DIAGNOSIS — Z1212 Encounter for screening for malignant neoplasm of rectum: Secondary | ICD-10-CM | POA: Diagnosis not present

## 2022-09-03 DIAGNOSIS — J3089 Other allergic rhinitis: Secondary | ICD-10-CM | POA: Diagnosis not present

## 2022-09-03 DIAGNOSIS — J3081 Allergic rhinitis due to animal (cat) (dog) hair and dander: Secondary | ICD-10-CM | POA: Diagnosis not present

## 2022-09-03 DIAGNOSIS — Z1211 Encounter for screening for malignant neoplasm of colon: Secondary | ICD-10-CM | POA: Diagnosis not present

## 2022-09-03 LAB — POC HEMOCCULT BLD/STL (HOME/3-CARD/SCREEN)
Card #2 Fecal Occult Blod, POC: NEGATIVE
Card #3 Fecal Occult Blood, POC: NEGATIVE
Fecal Occult Blood, POC: NEGATIVE

## 2022-09-10 DIAGNOSIS — J301 Allergic rhinitis due to pollen: Secondary | ICD-10-CM | POA: Diagnosis not present

## 2022-09-10 DIAGNOSIS — J3081 Allergic rhinitis due to animal (cat) (dog) hair and dander: Secondary | ICD-10-CM | POA: Diagnosis not present

## 2022-09-10 DIAGNOSIS — J3089 Other allergic rhinitis: Secondary | ICD-10-CM | POA: Diagnosis not present

## 2022-09-14 ENCOUNTER — Other Ambulatory Visit: Payer: Self-pay | Admitting: Nurse Practitioner

## 2022-09-14 ENCOUNTER — Other Ambulatory Visit: Payer: Self-pay | Admitting: Cardiology

## 2022-09-14 DIAGNOSIS — M2550 Pain in unspecified joint: Secondary | ICD-10-CM

## 2022-09-15 ENCOUNTER — Other Ambulatory Visit: Payer: Self-pay | Admitting: Nurse Practitioner

## 2022-09-15 DIAGNOSIS — M2550 Pain in unspecified joint: Secondary | ICD-10-CM

## 2022-09-17 DIAGNOSIS — J3089 Other allergic rhinitis: Secondary | ICD-10-CM | POA: Diagnosis not present

## 2022-09-17 DIAGNOSIS — J3081 Allergic rhinitis due to animal (cat) (dog) hair and dander: Secondary | ICD-10-CM | POA: Diagnosis not present

## 2022-09-17 DIAGNOSIS — J301 Allergic rhinitis due to pollen: Secondary | ICD-10-CM | POA: Diagnosis not present

## 2022-09-20 ENCOUNTER — Ambulatory Visit (INDEPENDENT_AMBULATORY_CARE_PROVIDER_SITE_OTHER): Payer: Medicare Other | Admitting: Nurse Practitioner

## 2022-09-20 VITALS — BP 112/68 | HR 72 | Temp 97.5°F | Ht 65.0 in | Wt 156.4 lb

## 2022-09-20 DIAGNOSIS — R350 Frequency of micturition: Secondary | ICD-10-CM | POA: Diagnosis not present

## 2022-09-20 DIAGNOSIS — K5900 Constipation, unspecified: Secondary | ICD-10-CM

## 2022-09-20 DIAGNOSIS — K649 Unspecified hemorrhoids: Secondary | ICD-10-CM | POA: Diagnosis not present

## 2022-09-20 DIAGNOSIS — R3915 Urgency of urination: Secondary | ICD-10-CM | POA: Diagnosis not present

## 2022-09-20 DIAGNOSIS — R300 Dysuria: Secondary | ICD-10-CM | POA: Diagnosis not present

## 2022-09-20 MED ORDER — NITROFURANTOIN MONOHYD MACRO 100 MG PO CAPS: 100.0000 mg | ORAL_CAPSULE | Freq: Two times a day (BID) | ORAL | 0 refills | Status: AC

## 2022-09-20 MED ORDER — HYDROCORTISONE ACETATE 25 MG RE SUPP: 25.0000 mg | Freq: Two times a day (BID) | RECTAL | 0 refills | Status: AC

## 2022-09-20 NOTE — Progress Notes (Signed)
Assessment and Plan:  [name] was seen today for an episodic visit.  Diagnoses and all order for this visit:  Urinary frequency and urgency/Dysuria/Constipation - Urinalysis, Routine w reflex microscopic - Urine Culture - nitrofurantoin, macrocrystal-monohydrate, (MACROBID) 100 MG capsule; Take 1 capsule (100 mg total) by mouth 2 (two) times daily for 7 days.  Dispense: 14 capsule; Refill: 0  Hemorrhoids, unspecified hemorrhoid type Stay well hydrated to keep stool softened. Take daily stool softener such as senokot, followed by Miralax if no BM in 3 days. Start Anusol suppository as needed as directed Discussed referral to GI if s/s fail to improve.  - hydrocortisone (ANUSOL-HC) 25 MG suppository; Place 1 suppository (25 mg total) rectally every 12 (twelve) hours for 6 days.  Dispense: 12 suppository; Refill: 0   Continue to monitor for any increase in fever, chills, N/V, diarrhea, changes to bowel habits, blood in stool or urine.  Notify office for further evaluation and treatment, questions or concerns if s/s fail to improve. The risks and benefits of my recommendations, as well as other treatment options were discussed with the patient today. Questions were answered.  Further disposition pending results of labs. Discussed med's effects and SE's.    Over 15 minutes of exam, counseling, chart review, and critical decision making was performed.   Future Appointments  Date Time Provider [place]  11/20/2022  2:30 PM Unk Pinto, MD [facility] None  05/13/2023  3:00 PM Unk Pinto, MD [facility] None  11/13/2023 11:00 AM Darrol Jump, NP [facility] None    ------------------------------------------------------------------------------------------------------------------   HPI BP 112/68   Pulse 72   Temp (!) 97.5 F (36.4 C)   Ht '5\' 5"'$  (1.651 m)   Wt 156 lb 6.4 oz (70.9 kg)   SpO2 99%   BMI 26.03 kg/m    Patient complains of burning with  urination, dysuria, frequency, suprapubic pressure, and urgency. She has had symptoms for 2 days. Patient also complains of  hemorrhoids .  She has not had a BM in 3 days.  Reports taking stool softener, with some relief.  She has not tried Miralax.  She is trying to stay well hydrated.  She uses Preporation H with some relief  She denies  melena, fever, chills, N/V  Patient denies back pain, fever, headache, stomach ache, and vaginal discharge. Patient does not have a history of recurrent UTI. Patient does not have a history of pyelonephritis.    Past Medical History:  Diagnosis Date   Allergy    enviromental   Anxiety    Arthritis    osteoarthritis   COPD (chronic obstructive pulmonary disease) (HCC)    mild   Diabetes mellitus without complication ([place])    controlled by diet   Environmental allergies    GERD (gastroesophageal reflux disease)    Hepatitis    hepatitis A   History of colon polyps    Hyperlipidemia    Hypertension    IBS (irritable bowel syndrome)    Mixed hyperlipidemia 06/15/2013   PONV (postoperative nausea and vomiting)    second surgery no problems last surgery   Solitary pulmonary nodule 06/02/2019   Urgency of urination    Takes oxybutynin; under control at this time     Allergies  Allergen Reactions   Enalapril Swelling    Caused lips to swell and bloating.   Statins     Tingling with several statins   Repatha [Evolocumab]     Stated she didn't tolerate it and switched to  Praluent    Current Outpatient Medications on File Prior to Visit  Medication Sig   albuterol (VENTOLIN HFA) 108 (90 Base) MCG/ACT inhaler Inhale 2 puffs into the lungs every 6 (six) hours as needed for wheezing or shortness of breath.   Ascorbic Acid (VITAMIN C) 1000 MG tablet Take 1,000 mg by mouth daily.   aspirin 81 MG tablet Take 81 mg by mouth every other day.    bisoprolol-hydrochlorothiazide (ZIAC) 10-6.25 MG tablet TAKE 1/2 TABLET BY MOUTH DAILY   buPROPion (WELLBUTRIN  XL) 150 MG 24 hr tablet Take  1 tablet  2 x /day  for Mood, Focus & Concentration                             /                                                                     TAKE                                  BY                                           MOUTH   cetirizine (ZYRTEC) 10 MG tablet Take 10 mg by mouth daily.   Cholecalciferol (VITAMIN D3) 5000 UNITS TABS Take 5,000 Units by mouth daily.   clonazePAM (KLONOPIN) 0.5 MG tablet TAKE 1/2 TO 1 TABLET BY MOUTH ONCE TO TWICE DAILY ONLY IF NEEDED FOR ANXIETY ATTACK AND LIMIT TO 5 DAYS PER WEEK TO AVOID ADDICTION AND DEMENTIA   clopidogrel (PLAVIX) 75 MG tablet Take  1 tablet  Daily  to Prevent Blood Clots   Cyanocobalamin (VITAMIN B12 PO) Take 1,000 mcg by mouth daily.   fluticasone (FLONASE) 50 MCG/ACT nasal spray 1 Spray each nostril daily   losartan (COZAAR) 100 MG tablet Take  1 tablet Daily for BP                                          /                      TAKE                          BY                                MOUTH   meloxicam (MOBIC) 7.5 MG tablet Take  1 tablet   Daily   with Food for Pain & Inflammation                                                 /  TAKE                                                   BY                                                        MOUTH   methylcellulose oral powder Take by mouth daily.    montelukast (SINGULAIR) 10 MG tablet Take 1 tablet (10 mg total) by mouth at bedtime.   Multiple Vitamins-Minerals (MULTI COMPLETE PO) Take 1 capsule by mouth daily.   NIFEdipine (PROCARDIA XL/NIFEDICAL XL) 60 MG 24 hr tablet TAKE 1 TABLET BY MOUTH DAILY.   Omega-3 Fatty Acids (FISH OIL) 1000 MG CAPS Take 1 capsule (1,000 mg total) by mouth 2 (two) times daily with a meal.   omeprazole (PRILOSEC) 40 MG capsule Take  1 capsule  2 x /day to Prevent Heartburn & Indigestion                          /                                              TAKE                                              BY                                     MOUTH   PRALUENT 150 MG/ML SOAJ INJECT 150 MG INTO THE SKIN EVERY 14 DAYS   Tiotropium Bromide-Olodaterol (STIOLTO RESPIMAT) 2.5-2.5 MCG/ACT AERS Inhale 2 puffs into the lungs daily.   Zinc 50 MG TABS Take by mouth daily.   Current Facility-Administered Medications on File Prior to Visit  Medication   ipratropium-albuterol (DUONEB) 0.5-2.5 (3) MG/3ML nebulizer solution 3 mL    ROS: all negative except what is noted in the HPI.   Physical Exam:  BP 112/68   Pulse 72   Temp (!) 97.5 F (36.4 C)   Ht '5\' 5"'$  (1.651 m)   Wt 156 lb 6.4 oz (70.9 kg)   SpO2 99%   BMI 26.03 kg/m   General Appearance: NAD.  Awake, conversant and cooperative. Eyes: PERRLA, EOMs intact.  Sclera white.  Conjunctiva without erythema. Sinuses: No frontal/maxillary tenderness.  No nasal discharge. Nares patent.  ENT/Mouth: Ext aud canals clear.  Bilateral TMs w/DOL and without erythema or bulging. Hearing intact.  Posterior pharynx without swelling or exudate.  Tonsils without swelling or erythema.  Neck: Supple.  No masses, nodules or thyromegaly. Respiratory: Effort is regular with non-labored breathing. Breath sounds are equal bilaterally without rales, rhonchi, wheezing or stridor.  Cardio: RRR with no MRGs. Brisk peripheral pulses without edema.  Abdomen: Active BS in all four quadrants.  Soft and non-tender without guarding, rebound  tenderness, hernias or masses. Lymphatics: Non tender without lymphadenopathy.  Musculoskeletal: Full ROM, 5/5 strength, normal ambulation.  No clubbing or cyanosis. Skin: Appropriate color for ethnicity. Warm without rashes, lesions, ecchymosis, ulcers.  Neuro: CN II-XII grossly normal. Normal muscle tone without cerebellar symptoms and intact sensation.   Psych: AO X 3,  appropriate mood and affect, insight and judgment.     Darrol Jump, NP 4:03 PM [facility]

## 2022-09-21 DIAGNOSIS — R350 Frequency of micturition: Secondary | ICD-10-CM | POA: Diagnosis not present

## 2022-09-21 DIAGNOSIS — R300 Dysuria: Secondary | ICD-10-CM | POA: Diagnosis not present

## 2022-09-21 DIAGNOSIS — R3915 Urgency of urination: Secondary | ICD-10-CM | POA: Diagnosis not present

## 2022-09-22 LAB — URINE CULTURE
MICRO NUMBER:: 14668918
SPECIMEN QUALITY:: ADEQUATE

## 2022-09-22 LAB — URINALYSIS, ROUTINE W REFLEX MICROSCOPIC
Bacteria, UA: NONE SEEN /HPF
Bilirubin Urine: NEGATIVE
Glucose, UA: NEGATIVE
Hgb urine dipstick: NEGATIVE
Nitrite: NEGATIVE
Protein, ur: NEGATIVE
Specific Gravity, Urine: 1.014 (ref 1.001–1.035)
Squamous Epithelial / HPF: NONE SEEN /HPF (ref <=5)
pH: 5.5 (ref 5.0–8.0)

## 2022-09-24 DIAGNOSIS — J3089 Other allergic rhinitis: Secondary | ICD-10-CM | POA: Diagnosis not present

## 2022-09-24 DIAGNOSIS — J3081 Allergic rhinitis due to animal (cat) (dog) hair and dander: Secondary | ICD-10-CM | POA: Diagnosis not present

## 2022-09-24 DIAGNOSIS — J301 Allergic rhinitis due to pollen: Secondary | ICD-10-CM | POA: Diagnosis not present

## 2022-09-24 NOTE — Patient Instructions (Addendum)
Hemorrhoids Hemorrhoids are swollen veins that may form: In the butt (rectum). These are called internal hemorrhoids. Around the opening of the butt (anus). These are called external hemorrhoids. Most hemorrhoids do not cause very bad problems. They often get better with changes to your lifestyle and what you eat. What are the causes? Having trouble pooping (constipation) or watery poop (diarrhea). Pushing too hard when you poop. Pregnancy. Being very overweight (obese). Sitting for too long. Riding a bike for a long time. Heavy lifting or other things that take a lot of effort. Anal sex. What are the signs or symptoms? Pain. Itching or soreness in the butt. Bleeding from the butt. Leaking poop. Swelling. One or more lumps around the opening of your butt. How is this treated? In most cases, hemorrhoids can be treated at home. You may be told to: Change what you eat. Make changes to your lifestyle. If these treatments do not help, you may need to have a procedure done. Your doctor may need to: Place rubber bands at the bottom of the hemorrhoids to make them fall off. Put medicine into the hemorrhoids to shrink them. Shine a type of light on the hemorrhoids to cause them to fall off. Do surgery to get rid of the hemorrhoids. Follow these instructions at home: Medicines Take over-the-counter and prescription medicines only as told by your doctor. Use creams with medicine in them or medicines that you put in your butt as told by your doctor. Eating and drinking  Eat foods that have a lot of fiber in them. These include whole grains, beans, nuts, fruits, and vegetables. Ask your doctor about taking products that have fiber added to them (fibersupplements). Take in less fat. You can do this by: Eating low-fat dairy products. Eating less red meat. Staying away from processed foods. Drink enough fluid to keep your pee (urine) pale yellow. Managing pain and swelling  Take a  warm-water bath (sitz bath) for 20 minutes to ease pain. Do this 3-4 times a day. You may do this in a bathtub. You may also use a portable sitz bath that fits over the toilet. If told, put ice on the painful area. It may help to use ice between your warm baths. Put ice in a plastic bag. Place a towel between your skin and the bag. Leave the ice on for 20 minutes, 2-3 times a day. If your skin turns bright red, take off the ice right away to prevent skin damage. The risk of damage is higher if you cannot feel pain, heat, or cold. General instructions Exercise. Ask your doctor how much and what kind of exercise is best for you. Go to the bathroom when you need to poop. Do not wait. Try not to push too hard when you poop. Keep your butt dry and clean. Use wet toilet paper or moist towelettes after you poop. Do not sit on the toilet for a long time. Contact a doctor if: You have pain and swelling that do not get better with treatment. You have trouble pooping. You cannot poop. You have pain or swelling outside the area of the hemorrhoids. Get help right away if: You have bleeding from the butt that will not stop. This information is not intended to replace advice given to you by your health care provider. Make sure you discuss any questions you have with your health care provider. Document Revised: 03/14/2022 Document Reviewed: 03/14/2022 Elsevier Patient Education  [place].   Urinary Tract Infection, Adult  A urinary tract infection (UTI) is an infection of any part of the urinary tract. The urinary tract includes: The kidneys. The ureters. The bladder. The urethra. These organs make, store, and get rid of pee (urine) in the body. What are the causes? This infection is caused by germs (bacteria) in your genital area. These germs grow and cause swelling (inflammation) of your urinary tract. What increases the risk? The following factors may make you more likely to develop  this condition: Using a small, thin tube (catheter) to drain pee. Not being able to control when you pee or poop (incontinence). Being female. If you are female, these things can increase the risk: Using these methods to prevent pregnancy: A medicine that kills sperm (spermicide). A device that blocks sperm (diaphragm). Having low levels of a female hormone (estrogen). Being pregnant. You are more likely to develop this condition if: You have genes that add to your risk. You are sexually active. You take antibiotic medicines. You have trouble peeing because of: A prostate that is bigger than normal, if you are female. A blockage in the part of your body that drains pee from the bladder. A kidney stone. A nerve condition that affects your bladder. Not getting enough to drink. Not peeing often enough. You have other conditions, such as: Diabetes. A weak disease-fighting system (immune system). Sickle cell disease. Gout. Injury of the spine. What are the signs or symptoms? Symptoms of this condition include: Needing to pee right away. Peeing small amounts often. Pain or burning when peeing. Blood in the pee. Pee that smells bad or not like normal. Trouble peeing. Pee that is cloudy. Fluid coming from the vagina, if you are female. Pain in the belly or lower back. Other symptoms include: Vomiting. Not feeling hungry. Feeling mixed up (confused). This may be the first symptom in older adults. Being tired and grouchy (irritable). A fever. Watery poop (diarrhea). How is this treated? Taking antibiotic medicine. Taking other medicines. Drinking enough water. In some cases, you may need to see a specialist. Follow these instructions at home:  Medicines Take over-the-counter and prescription medicines only as told by your doctor. If you were prescribed an antibiotic medicine, take it as told by your doctor. Do not stop taking it even if you start to feel better. General  instructions Make sure you: Pee until your bladder is empty. Do not hold pee for a long time. Empty your bladder after sex. Wipe from front to back after peeing or pooping if you are a female. Use each tissue one time when you wipe. Drink enough fluid to keep your pee pale yellow. Keep all follow-up visits. Contact a doctor if: You do not get better after 1-2 days. Your symptoms go away and then come back. Get help right away if: You have very bad back pain. You have very bad pain in your lower belly. You have a fever. You have chills. You feeling like you will vomit or you vomit. Summary A urinary tract infection (UTI) is an infection of any part of the urinary tract. This condition is caused by germs in your genital area. There are many risk factors for a UTI. Treatment includes antibiotic medicines. Drink enough fluid to keep your pee pale yellow. This information is not intended to replace advice given to you by your health care provider. Make sure you discuss any questions you have with your health care provider. Document Revised: 02/12/2020 Document Reviewed: 02/12/2020 Elsevier Patient Education  [place].

## 2022-10-03 DIAGNOSIS — J3089 Other allergic rhinitis: Secondary | ICD-10-CM | POA: Diagnosis not present

## 2022-10-03 DIAGNOSIS — J3081 Allergic rhinitis due to animal (cat) (dog) hair and dander: Secondary | ICD-10-CM | POA: Diagnosis not present

## 2022-10-03 DIAGNOSIS — J301 Allergic rhinitis due to pollen: Secondary | ICD-10-CM | POA: Diagnosis not present

## 2022-10-08 DIAGNOSIS — J3089 Other allergic rhinitis: Secondary | ICD-10-CM | POA: Diagnosis not present

## 2022-10-08 DIAGNOSIS — J301 Allergic rhinitis due to pollen: Secondary | ICD-10-CM | POA: Diagnosis not present

## 2022-10-08 DIAGNOSIS — J3081 Allergic rhinitis due to animal (cat) (dog) hair and dander: Secondary | ICD-10-CM | POA: Diagnosis not present

## 2022-10-11 DIAGNOSIS — J3089 Other allergic rhinitis: Secondary | ICD-10-CM | POA: Diagnosis not present

## 2022-10-11 DIAGNOSIS — J301 Allergic rhinitis due to pollen: Secondary | ICD-10-CM | POA: Diagnosis not present

## 2022-10-15 DIAGNOSIS — J301 Allergic rhinitis due to pollen: Secondary | ICD-10-CM | POA: Diagnosis not present

## 2022-10-15 DIAGNOSIS — J3081 Allergic rhinitis due to animal (cat) (dog) hair and dander: Secondary | ICD-10-CM | POA: Diagnosis not present

## 2022-10-15 DIAGNOSIS — J3089 Other allergic rhinitis: Secondary | ICD-10-CM | POA: Diagnosis not present

## 2022-10-17 DIAGNOSIS — J3081 Allergic rhinitis due to animal (cat) (dog) hair and dander: Secondary | ICD-10-CM | POA: Diagnosis not present

## 2022-10-17 DIAGNOSIS — J301 Allergic rhinitis due to pollen: Secondary | ICD-10-CM | POA: Diagnosis not present

## 2022-10-17 DIAGNOSIS — J3089 Other allergic rhinitis: Secondary | ICD-10-CM | POA: Diagnosis not present

## 2022-10-21 ENCOUNTER — Other Ambulatory Visit: Payer: Self-pay | Admitting: Cardiology

## 2022-10-22 DIAGNOSIS — J3089 Other allergic rhinitis: Secondary | ICD-10-CM | POA: Diagnosis not present

## 2022-10-22 DIAGNOSIS — J3081 Allergic rhinitis due to animal (cat) (dog) hair and dander: Secondary | ICD-10-CM | POA: Diagnosis not present

## 2022-10-22 DIAGNOSIS — J301 Allergic rhinitis due to pollen: Secondary | ICD-10-CM | POA: Diagnosis not present

## 2022-10-24 ENCOUNTER — Other Ambulatory Visit: Payer: Self-pay | Admitting: Cardiology

## 2022-10-25 ENCOUNTER — Telehealth: Payer: Self-pay | Admitting: Cardiology

## 2022-10-25 DIAGNOSIS — J3089 Other allergic rhinitis: Secondary | ICD-10-CM | POA: Diagnosis not present

## 2022-10-25 DIAGNOSIS — J301 Allergic rhinitis due to pollen: Secondary | ICD-10-CM | POA: Diagnosis not present

## 2022-10-25 NOTE — Telephone Encounter (Signed)
*  STAT* If patient is at the pharmacy, call can be transferred to refill team.   1. Which medications need to be refilled? (please list name of each medication and dose if known) NIFEdipine (PROCARDIA XL/NIFEDICAL XL) 60 MG 24 hr tablet   2. Which pharmacy/location (including street and city if local pharmacy) is medication to be sent to? [id number] - [place], [place] - [address] AT [facility] OF [address]   3. Do they need a 30 day or 90 day supply? 90 day   Pt is scheduled to see Carlos Levering PA on 5/17. Pt has been out of this medication since last week.

## 2022-10-26 ENCOUNTER — Other Ambulatory Visit: Payer: Self-pay

## 2022-10-26 MED ORDER — NIFEDIPINE ER OSMOTIC RELEASE 60 MG PO TB24: 60.0000 mg | ORAL_TABLET | Freq: Every day | ORAL | 1 refills | Status: DC

## 2022-10-26 NOTE — Telephone Encounter (Signed)
Pt's medication has already been sent to pt's pharmacy as requested. Confirmation received.  

## 2022-10-30 ENCOUNTER — Other Ambulatory Visit: Payer: Self-pay | Admitting: Cardiology

## 2022-10-30 DIAGNOSIS — J3089 Other allergic rhinitis: Secondary | ICD-10-CM | POA: Diagnosis not present

## 2022-10-30 DIAGNOSIS — J301 Allergic rhinitis due to pollen: Secondary | ICD-10-CM | POA: Diagnosis not present

## 2022-10-30 DIAGNOSIS — J3081 Allergic rhinitis due to animal (cat) (dog) hair and dander: Secondary | ICD-10-CM | POA: Diagnosis not present

## 2022-11-05 DIAGNOSIS — J3081 Allergic rhinitis due to animal (cat) (dog) hair and dander: Secondary | ICD-10-CM | POA: Diagnosis not present

## 2022-11-05 DIAGNOSIS — J301 Allergic rhinitis due to pollen: Secondary | ICD-10-CM | POA: Diagnosis not present

## 2022-11-05 DIAGNOSIS — J3089 Other allergic rhinitis: Secondary | ICD-10-CM | POA: Diagnosis not present

## 2022-11-07 ENCOUNTER — Ambulatory Visit: Payer: Medicare Other | Admitting: Nurse Practitioner

## 2022-11-08 ENCOUNTER — Ambulatory Visit: Payer: Medicare Other | Admitting: Internal Medicine

## 2022-11-08 DIAGNOSIS — J3081 Allergic rhinitis due to animal (cat) (dog) hair and dander: Secondary | ICD-10-CM | POA: Diagnosis not present

## 2022-11-08 DIAGNOSIS — J3089 Other allergic rhinitis: Secondary | ICD-10-CM | POA: Diagnosis not present

## 2022-11-08 DIAGNOSIS — J301 Allergic rhinitis due to pollen: Secondary | ICD-10-CM | POA: Diagnosis not present

## 2022-11-12 ENCOUNTER — Encounter: Payer: Self-pay | Admitting: Internal Medicine

## 2022-11-12 NOTE — Patient Instructions (Signed)
Hemorrhoids Hemorrhoids are swollen veins in and around the rectum or the opening of the butt (anus). There are two types of hemorrhoids: Internal. These occur in the veins just inside the rectum. They may poke through to the outside and become irritated and painful. External. These occur in the veins outside the anus. They can be felt as a painful swelling or hard lump near the anus. Most hemorrhoids do not cause severe problems. Often, they can be treated at home with diet and lifestyle changes. If home treatments do not help, you may need a procedure to shrink or remove the hemorrhoids. What are the causes? Hemorrhoids are caused by pressure near the anus. This pressure may be caused by: Constipation or diarrhea. Straining to poop. Pregnancy. Obesity. Sitting or riding a bike for a long time. Heavy lifting or other things that cause you to strain. Anal sex. What are the signs or symptoms? Symptoms of this condition include: Pain. Anal itching or irritation. Bleeding from the rectum. Leakage of poop (stool). Swelling of the anus. One or more lumps around the anus. How is this diagnosed? Hemorrhoids can often be diagnosed through a visual exam. Other exams or tests may also be done, such as: A digital rectal exam. This is when your health care provider feels inside your rectum with a gloved finger. Anoscope. This is an exam of the anus using a small tube. A blood test, if you have lost a lot of blood. A sigmoidoscopy or colonoscopy. These are tests to look inside the colon using a tube with a camera on the end. How is this treated? In most cases, hemorrhoids can be treated at home with diet and lifestyle changes. If these changes do not help, you may need to have a procedure done. These procedures can make the hemorrhoids smaller or fully remove them. Common procedures include: Rubber band ligation. Rubber bands are placed at the base of the hemorrhoids to cut off their blood  supply. Sclerotherapy. Medicine is put into the hemorrhoids to shrink them. Infrared coagulation. A type of light energy is used to get rid of the hemorrhoids. Hemorrhoidectomy surgery. The hemorrhoids are removed during surgery. Then, the veins that supply them are tied off. Stapled hemorrhoidopexy surgery. The base of the hemorrhoid is stapled to the wall of the rectum. Follow these instructions at home: Medicines Take over-the-counter and prescription medicines only as told by your provider. Use medicated creams or medicines that are put in the rectum (suppositories) as told by your provider. Eating and drinking  Eat foods that are high in fiber, such as beans, whole grains, and fresh fruits and vegetables. Ask your provider about taking products that have fiber added to them (fiber supplements). Reduce the amount of fat in your diet. You can do this by eating low-fat dairy products, eating less red meat, and avoiding processed foods. Drink enough fluid to keep your pee (urine) pale yellow. Managing pain and swelling  Take warm sitz baths for 20 minutes, 3-4 times a day. This can help ease pain and discomfort. You may do this in a bathtub or you can use a portable sitz bath that fits over the toilet. If told, put ice on the affected area. It may help to use ice packs between sitz baths. Put ice in a plastic bag. Place a towel between your skin and the bag. Leave the ice on for 20 minutes, 2-3 times a day. If your skin turns bright red, remove the ice right away to prevent   skin damage. The risk of damage is higher if you cannot feel pain, heat, or cold. General instructions Exercise. Ask your provider how much and what kind of exercise is best for you. In general, you should do moderate exercise for at least 30 minutes on most days of the week (150 minutes each week). You may want to try walking, biking, or yoga. Go to the bathroom when you have the urge to poop. Do not wait. Avoid  straining to poop. Keep the anus dry and clean. Use wet toilet paper or moist towelettes after you poop. Do not sit on the toilet for a long time. This can increase blood pooling and pain. Where to find more information National Institute of Diabetes and Digestive and Kidney Diseases: niddk.nih.gov Contact a health care provider if: You have more pain and swelling that do not get better with treatment. You have trouble pooping or you are not able to poop. You have pain or inflammation outside the area of the hemorrhoids. Get help right away if: You are bleeding from your rectum and you cannot get it to stop. This information is not intended to replace advice given to you by your health care provider. Make sure you discuss any questions you have with your health care provider. Document Revised: 03/14/2022 Document Reviewed: 03/14/2022 Elsevier Patient Education  2023 [employer].  

## 2022-11-12 NOTE — Progress Notes (Unsigned)
Future Appointments  Date Time Provider Department  11/13/2022 11:30 AM Lucky Cowboy, MD [facility]  11/20/2022  2:30 PM Lucky Cowboy, MD [facility]  11/30/2022  8:00 AM Carlos Levering, NP [facility]  05/13/2023  3:00 PM Lucky Cowboy, MD [facility]  11/13/2023 11:00 AM Adela Glimpse, NP [facility]    History of Present Illness:      This very nice 72 y.o. [place] Lady with HTN, HLD, Aortic Atherosclerosis,  Prediabetes, GERD and Vitamin D Deficiency  who presents with concerns external hemorrhoids. Has used hemorrhoidal suppositories w/o relief. Also, she's c/o bilateral low abdominal pains . No N/V, D/C hematochezia or melena.      Current Outpatient Medications on File Prior to Visit  Medication Sig   albuterol (VENTOLIN HFA) 108 (90 Base) MCG/ACT inhaler Inhale 2 puffs into the lungs every 6 (six) hours as needed for wheezing or shortness of breath.   Ascorbic Acid (VITAMIN C) 1000 MG tablet Take 1,000 mg by mouth daily.   aspirin 81 MG tablet Take 81 mg by mouth every other day.    bisoprolol-hydrochlorothiazide (ZIAC) 10-6.25 MG tablet TAKE 1/2 TABLET BY MOUTH DAILY   buPROPion (WELLBUTRIN XL) 150 MG 24 hr tablet Take  1 tablet  2 x /day  for Mood, Focus & Concentration                             /                                                                     TAKE                                  BY                                           MOUTH   cetirizine (ZYRTEC) 10 MG tablet Take 10 mg by mouth daily.   Cholecalciferol (VITAMIN D3) 5000 UNITS TABS Take 5,000 Units by mouth daily.   clonazePAM (KLONOPIN) 0.5 MG tablet TAKE 1/2 TO 1 TABLET BY MOUTH ONCE TO TWICE DAILY ONLY IF NEEDED FOR ANXIETY ATTACK AND LIMIT TO 5 DAYS PER WEEK TO AVOID ADDICTION AND DEMENTIA   clopidogrel (PLAVIX) 75 MG tablet Take  1 tablet  Daily  to Prevent Blood Clots   VITAMIN B12   1,000 mcg   Takedaily.   fluticasone (FLONASE) 50 MCG/ACT nasal spray 1 Spray  each nostril daily   losartan (COZAAR) 100 MG tablet Take  1 tablet Daily   meloxicam (MOBIC) 7.5 MG tablet Take  1 tablet   Daily      methylcellulose oral powder Take by mouth daily.    montelukast 10 MG tablet Take 1 tablet (10 mg total) by mouth at bedtime.   Multiple Vitamins-Minerals  Take 1 capsule  daily.   NIFEdipine  XL 60 MG  Take 1 tablet daily.   Omega-3 FISH OIL1000 MG CAPS Take 1 capsule (2 (two) times daily with a meal.  omeprazole 40 MG capsule Take  1 capsule  2 x /day    PRALUENT 150 MG/ML SOAJ INJECT 150 MG INTO THE SKIN EVERY 14 DAYS   RESPIMAT) 2.5-2.5 MCG/ACT AERS Inhale 2 puffs into the lungs daily.   Zinc 50 MG TABS Take by mouth daily.   Current Facility-Administered Medications on File Prior to Visit  Medication   ipratropium-albuterol (DUONEB) 0.5-2.5 (3) MG/3ML nebulizer solution 3 mL    Allergies  Allergen Reactions   Enalapril Swelling    Caused lips to swell and bloating.   Statins     Tingling with several statins   Repatha [Evolocumab]     Stated she didn't tolerate it and switched to Praluent     Problem list She has Essential hypertension; Hyperlipidemia, mixed; Vitamin D deficiency; Other abnormal glucose; GERD (gastroesophageal reflux disease); Aortic atherosclerosis by Abd CT scan in 2018  [facility]); Recurrent mild major depressive disorder with anxiety (HCC); Former smoker; COPD GOLD A-B; OSA (obstructive sleep apnea); Statin Intolerance; Allergic rhinitis; Screening for ischemic heart disease; FHx: heart disease; 3-vessel CAD; Palpitations; Centrilobular emphysema (HCC); Tobacco abuse; Lung nodules; and Cough on their problem list.   Observations/Objective:  BP 136/80   Pulse 61   Temp 97.9 F (36.6 C)   Resp 16   Ht 5\' 5"  (1.651 m)   Wt 160 lb 9.6 oz (72.8 kg)   SpO2 97%   BMI 26.73 kg/m   HEENT - WNL. Neck - supple.  Chest - Clear equal BS. Cor - Nl HS. RRR w/o sig MGR. PP 1(+). No edema. Abd -  Soft with bilateral lower  abdominal tenderness w/o guarding or RB. BS -Nl. MS- FROM w/o deformities.  Gait Nl. Neuro -  Nl w/o focal abnormalities.  Assessment and Plan:  1. Lower abdominal pain  - CT Abdomen Pelvis Wo Contrast; Future  2. External hemorrhoids  - hydrocortisone (ANUSOL-HC) 2.5 % rectal cream; Place 1 Application rectally 2 (two) times daily. Apply to external hemorrhoids 3 to 4  x / day  Dispense: 30 g; Refill: 3  3. RLQ  abdominal pain  - CT Abdomen Pelvis Wo Contrast; Future  4. LLQ abdominal pain  - CT Abdomen Pelvis Wo Contrast; Future           Follow Up Instructions:        I discussed the assessment and treatment plan with the patient. The patient was provided an opportunity to ask questions and all were answered. The patient agreed with the plan and demonstrated an understanding of the instructions.       The patient was advised to call back or seek an in-person evaluation if the symptoms worsen or if the condition fails to improve as anticipated.    Marinus Maw, MD

## 2022-11-13 ENCOUNTER — Ambulatory Visit (INDEPENDENT_AMBULATORY_CARE_PROVIDER_SITE_OTHER): Payer: Medicare Other | Admitting: Internal Medicine

## 2022-11-13 ENCOUNTER — Encounter: Payer: Self-pay | Admitting: Internal Medicine

## 2022-11-13 VITALS — BP 136/80 | HR 61 | Temp 97.9°F | Resp 16 | Ht 65.0 in | Wt 160.6 lb

## 2022-11-13 DIAGNOSIS — R1030 Lower abdominal pain, unspecified: Secondary | ICD-10-CM

## 2022-11-13 DIAGNOSIS — J3089 Other allergic rhinitis: Secondary | ICD-10-CM | POA: Diagnosis not present

## 2022-11-13 DIAGNOSIS — R1032 Left lower quadrant pain: Secondary | ICD-10-CM

## 2022-11-13 DIAGNOSIS — R1031 Right lower quadrant pain: Secondary | ICD-10-CM

## 2022-11-13 DIAGNOSIS — K644 Residual hemorrhoidal skin tags: Secondary | ICD-10-CM

## 2022-11-13 MED ORDER — HYDROCORTISONE (PERIANAL) 2.5 % EX CREA: 1.0000 | TOPICAL_CREAM | Freq: Two times a day (BID) | CUTANEOUS | 3 refills | Status: AC

## 2022-11-15 ENCOUNTER — Ambulatory Visit (HOSPITAL_BASED_OUTPATIENT_CLINIC_OR_DEPARTMENT_OTHER)
Admission: RE | Admit: 2022-11-15 | Discharge: 2022-11-15 | Disposition: A | Discharge status: Home / Self Care | Payer: Medicare Other | Source: Ambulatory Visit | Attending: Internal Medicine | Admitting: Internal Medicine

## 2022-11-15 DIAGNOSIS — R1030 Lower abdominal pain, unspecified: Secondary | ICD-10-CM | POA: Diagnosis not present

## 2022-11-15 DIAGNOSIS — R1031 Right lower quadrant pain: Secondary | ICD-10-CM | POA: Diagnosis not present

## 2022-11-15 DIAGNOSIS — R1032 Left lower quadrant pain: Secondary | ICD-10-CM

## 2022-11-15 DIAGNOSIS — N3289 Other specified disorders of bladder: Secondary | ICD-10-CM | POA: Diagnosis not present

## 2022-11-19 ENCOUNTER — Encounter: Payer: Self-pay | Admitting: Internal Medicine

## 2022-11-19 DIAGNOSIS — G720 Drug-induced myopathy: Secondary | ICD-10-CM | POA: Insufficient documentation

## 2022-11-19 DIAGNOSIS — M609 Myositis, unspecified: Secondary | ICD-10-CM

## 2022-11-19 DIAGNOSIS — M791 Myalgia, unspecified site: Secondary | ICD-10-CM | POA: Insufficient documentation

## 2022-11-19 DIAGNOSIS — G729 Myopathy, unspecified: Secondary | ICD-10-CM | POA: Insufficient documentation

## 2022-11-19 NOTE — Progress Notes (Signed)
[facility])                                            [facility] Quality Pharmacy Team                                        Statin Quality Measure Assessment    11/19/2022  [name] [name], [name] [phone]   I am a [facility] clinical pharmacist that reviews patients for statin quality initiatives.     Per review of chart and payor information, patient has a diagnosis of cardiovascular disease but is not currently filling a statin prescription.  This places patient into the [facility] (Statin Use In Patients with Cardiovascular Disease) measure for CMS.    Patient has documented allergy to statin but no corresponding CPT codes that would exclude patient from [facility] measure.      Component Value Date/Time   CHOL 188 02/Solomon/2024 1604   TRIG 154 (H) 02/Solomon/2024 1604   HDL 86 02/Solomon/2024 1604   CHOLHDL 2.2 02/Solomon/2024 1604   VLDL 18 12/18/2016 1225   LDLCALC 76 02/Solomon/2024 1604     Please consider ONE of the following recommendations:  Initiate high intensity statin Atorvastatin 40mg  once daily, #90, 3 refills   Rosuvastatin 20mg  once daily, #90, 3 refills    Initiate moderate intensity  statin with reduced frequency if prior  statin intolerance 1x weekly, #13, 3 refills   2x weekly, #26, 3 refills   3x weekly, #39, 3 refills    Code for past statin intolerance  (required annually)  Provider Requirements: Must asociate code during an office visit or telehealth encounter   Drug Induced Myopathy G72.0   Myalgia M79.1   Myositis, unspecified M60.9   Myopathy, unspecified G72.9   Rhabdomyolysis M62.82     Please let us know your decision.    Thank you!  Harlon Flor, PharmD Clinical Pharmacist  [facility] [phone]

## 2022-11-19 NOTE — Progress Notes (Signed)
^<^<^<^<^<^<^<^<^<^<^<^<^<^<^<^<^<^<^<^<^<^<^<^<^<^<^<^<^<^<^<^<^<^<^<^<^ ^>^>^>^>^>^>^>^>^>^>^>>^>^>^>^>^>^>^>^>^>^>^>^>^>^>^>^>^>^>^>^>^>^>^>^>^>  -    Abdominal CT scan - returned a very good report                                                        - No sign of any cancers or "bad problems"  ^<^<^<^<^<^<^<^<^<^<^<^<^<^<^<^<^<^<^<^<^<^<^<^<^<^<^<^<^<^<^<^<^<^<^<^<^ ^>^>^>^>^>^>^>^>^>^>^>^>^>^>^>^>^>^>^>^>^>^>^>^>^>^>^>^>^>^>^>^>^>^>^>^>^  -

## 2022-11-20 ENCOUNTER — Ambulatory Visit (INDEPENDENT_AMBULATORY_CARE_PROVIDER_SITE_OTHER): Payer: Medicare Other | Admitting: Internal Medicine

## 2022-11-20 ENCOUNTER — Encounter: Payer: Self-pay | Admitting: Internal Medicine

## 2022-11-20 VITALS — BP 172/80 | HR 76 | Temp 97.5°F | Resp 16 | Ht 65.0 in | Wt 159.2 lb

## 2022-11-20 DIAGNOSIS — E559 Vitamin D deficiency, unspecified: Secondary | ICD-10-CM

## 2022-11-20 DIAGNOSIS — I10 Essential (primary) hypertension: Secondary | ICD-10-CM

## 2022-11-20 DIAGNOSIS — E782 Mixed hyperlipidemia: Secondary | ICD-10-CM | POA: Diagnosis not present

## 2022-11-20 DIAGNOSIS — K219 Gastro-esophageal reflux disease without esophagitis: Secondary | ICD-10-CM

## 2022-11-20 DIAGNOSIS — H34239 Retinal artery branch occlusion, unspecified eye: Secondary | ICD-10-CM

## 2022-11-20 DIAGNOSIS — J3089 Other allergic rhinitis: Secondary | ICD-10-CM | POA: Diagnosis not present

## 2022-11-20 DIAGNOSIS — M791 Myalgia, unspecified site: Secondary | ICD-10-CM

## 2022-11-20 DIAGNOSIS — G4733 Obstructive sleep apnea (adult) (pediatric): Secondary | ICD-10-CM | POA: Diagnosis not present

## 2022-11-20 DIAGNOSIS — R7309 Other abnormal glucose: Secondary | ICD-10-CM | POA: Diagnosis not present

## 2022-11-20 DIAGNOSIS — I700 Atherosclerosis of aorta: Secondary | ICD-10-CM | POA: Diagnosis not present

## 2022-11-20 DIAGNOSIS — J3081 Allergic rhinitis due to animal (cat) (dog) hair and dander: Secondary | ICD-10-CM | POA: Diagnosis not present

## 2022-11-20 DIAGNOSIS — Z79899 Other long term (current) drug therapy: Secondary | ICD-10-CM

## 2022-11-20 DIAGNOSIS — G729 Myopathy, unspecified: Secondary | ICD-10-CM

## 2022-11-20 DIAGNOSIS — M609 Myositis, unspecified: Secondary | ICD-10-CM

## 2022-11-20 DIAGNOSIS — F330 Major depressive disorder, recurrent, mild: Secondary | ICD-10-CM

## 2022-11-20 DIAGNOSIS — T50905A Adverse effect of unspecified drugs, medicaments and biological substances, initial encounter: Secondary | ICD-10-CM

## 2022-11-20 DIAGNOSIS — F419 Anxiety disorder, unspecified: Secondary | ICD-10-CM

## 2022-11-20 DIAGNOSIS — J301 Allergic rhinitis due to pollen: Secondary | ICD-10-CM | POA: Diagnosis not present

## 2022-11-20 MED ORDER — NIFEDIPINE ER OSMOTIC RELEASE 60 MG PO TB24: ORAL_TABLET | ORAL | 3 refills | Status: DC

## 2022-11-20 MED ORDER — CLOPIDOGREL BISULFATE 75 MG PO TABS
ORAL_TABLET | ORAL | 3 refills | Status: AC
Start: 2022-11-20 — End: ?

## 2022-11-20 NOTE — Progress Notes (Signed)
Future Appointments  Date Time Provider Department  10/24/2021 11:30 AM Sherri Cowboy, MD [facility]  04/24/2022 11:00 AM Sherri Cowboy, MD [facility]    History of Present Illness:       This very nice 72 y.o.  [name] ([employer] [name] presents for 6 month follow up with HTN, HLD, Pre-Diabetes and Vitamin D Deficiency.   In 2018,  Abd CT scan showed Aortic Atherosclerosis. Recent repeat Abd CT scan was likewise unremarkable.   Patient is on CPAP for OSA with COPD overlap  with improved sleep hygiene followed by Dr [name].  Patient's GERD is controlled on diet & Omeprazole. She also has a major Depressive disorder controlled on Bupropion.           Patient is treated for HTN  since 1988 & BP has been controlled at home. Today's  .    Patient has evaluation by Dr [name] for Coronary Artery calcification & Stress cardiac echo was Negative. Patient has had no complaints of any cardiac type chest pain, palpitations, dyspnea [name] /PND, dizziness, claudication or dependent edema.        Hyperlipidemia is controlled with diet & was intolerant of Statins & Repatha ,  but is controlled on Praulent & Fish Oil . Patient denies myalgias or other med SE's. Last Lipids were at goal :  Lab Results  Component Value Date   CHOL 188 08/16/2022   HDL 86 08/16/2022   LDLCALC 76 08/16/2022   TRIG 154 (H) 08/16/2022   CHOLHDL 2.2 08/16/2022     Also, the patient has history of PreDiabetes   (A1c 5.7% /2017) and has had no symptoms of reactive hypoglycemia, diabetic polys, paresthesias or visual blurring.  Last A1c was normal & at goal :  Lab Results  Component Value Date   HGBA1C 5.8 (H) 05/07/2022                                                      Further, the patient also has history of Vitamin D Deficiency and supplements vitamin D without any suspected side-effects. Last vitamin D was at goal :  Lab Results  Component Value Date   VD25OH 63 08/16/2022       Current  Outpatient Medications on File Prior to Visit  Medication Sig   PRALUENT 150 MG/ML  Inject 150 mg into the skin every 14 days.   VITAMIN C 1000 MG tablet Take daily.   aspirin 81 MG tablet Take every other day.    bisoprolol-hctz 10-6.25 MG  TAKE 1/2 TABLET DAILY   buPROPion XL 150 MG  Take 1 tablet  Daily   VITAMIN D  5000 u Take daily.   clonazePAM 0.5 MG tablet Take 1/2 - 1 tablet 1 to 2  x /day ONLY if needed    clopidogrel 75 MG tablet Take  1 tablet  Daily    VITAMIN B12  1,000 mcg  Take daily.   FLONASE  nasal spray 1 Spray each nostril daily   losartan  100 MG tablet TAKE 1 TABLET  DAILY    methylcellulose oral powder Take daily.    montelukast 10 MG tablet TAKE 1 TABLET  DAILY   Multiple Vitamins-Minerals  Take 1 capsule  daily.   NIFEdipine X  60 MG   Take 2 tablets (120  mg total)  daily.   Omega-3 FISH OIL 1000 MG  Take 1 capsule 2  times daily with a meal.   omeprazole 40 MG capsule Take  1 capsule  2 x /day (every 12 hours)    Tiotropium RESPIMAT  Inhale 1 puff  2 times daily.   Zinc 50 MG TABS Take daily.     Allergies  Allergen Reactions   Enalapril Swelling    Caused lips to swell and bloating.   Statins     Tingling with several statins   Repatha [Evolocumab]     Stated she didn't tolerate it and switched to Praluent    PMHx:   Past Medical History:  Diagnosis Date   Allergy    enviromental   Anxiety    Arthritis    osteoarthritis   COPD (chronic obstructive pulmonary disease) (HCC)    mild   Diabetes mellitus without complication (HCC)    controlled by diet   Environmental allergies    GERD (gastroesophageal reflux disease)    Hepatitis    hepatitis A   History of colon polyps    Hyperlipidemia    Hypertension    IBS (irritable bowel syndrome)    Mixed hyperlipidemia 06/15/2013   PONV (postoperative nausea and vomiting)    second surgery no problems last surgery   Solitary pulmonary nodule 06/02/2019   Urgency of urination    Takes  oxybutynin; under control at this time     Immunization History  Administered Date(s) Administered   Influenza, High Dose   04/10/2017, 05/08/2019, 05/24/2020, 04/24/2021   Influenza  03/05/2018   PFIZER SARS-COV-2 Vacc  08/13/2019, 09/09/2019   PPD Test 09/21/2014   Pneumococcal  -13 10/22/2017   Pneumococcal -23 02/07/2011, 01/26/2016   Tdap 02/07/2011     Past Surgical History:  Procedure Laterality Date   APPENDECTOMY     removed as child   BACK SURGERY     lumbar fusion   CATARACT EXTRACTION Left 2019   COLONOSCOPY     DILATION AND CURETTAGE OF UTERUS     LUMBAR FUSION  02/06/2012   L 4  L5   PARTIAL HYSTERECTOMY     POLYPECTOMY     TOTAL HIP ARTHROPLASTY  06/02/2012   Procedure: TOTAL HIP ARTHROPLASTY;  Surgeon: Sherri Lewandowsky, MD;  Location: [facility] OR;  Service: Orthopedics;  Laterality: Right;   TOTAL HIP ARTHROPLASTY Left 01/25/2016   TOTAL HIP ARTHROPLASTY Left 01/25/2016   Procedure: LEFT TOTAL HIP ARTHROPLASTY;  Surgeon: Sherri Birchwood, MD;  Location: [facility] OR;  Service: Orthopedics;  Laterality: Left;   TOTAL KNEE ARTHROPLASTY Right 12/15/2012   Procedure: TOTAL KNEE ARTHROPLASTY;  Surgeon: Sherri Lewandowsky, MD;  Location: [facility] OR;  Service: Orthopedics;  Laterality: Right;   TOTAL KNEE ARTHROPLASTY Left 06/18/2016   Procedure: TOTAL KNEE ARTHROPLASTY;  Surgeon: Sherri Birchwood, MD;  Location: [facility] OR;  Service: Orthopedics;  Laterality: Left;   UPPER GASTROINTESTINAL ENDOSCOPY  05/06/2019    FHx:    Reviewed / unchanged  SHx:    Reviewed / unchanged   Systems Review:  Constitutional: Denies fever, chills, wt changes, headaches, insomnia, fatigue, night sweats, change in appetite. Eyes: Denies redness, blurred vision, diplopia, discharge, itchy, watery eyes.  ENT: Denies discharge, congestion, post nasal drip, epistaxis, sore throat, earache, hearing loss, dental pain, tinnitus, vertigo, sinus pain, snoring.  CV: Denies chest pain, palpitations, irregular heartbeat, syncope,  dyspnea, diaphoresis, orthopnea, PND, claudication or edema. Respiratory: denies cough, dyspnea, DOE, pleurisy, hoarseness, laryngitis,  wheezing.  Gastrointestinal: Denies dysphagia, odynophagia, heartburn, reflux, water brash, abdominal pain or cramps, nausea, vomiting, bloating, diarrhea, constipation, hematemesis, melena, hematochezia  or hemorrhoids. Genitourinary: Denies dysuria, frequency, urgency, nocturia, hesitancy, discharge, hematuria or flank pain. Musculoskeletal: Denies arthralgias, myalgias, stiffness, jt. swelling, pain, limping or strain/sprain.  Skin: Denies pruritus, rash, hives, warts, acne, eczema or change in skin lesion(s). Neuro: No weakness, tremor, incoordination, spasms, paresthesia or pain. Psychiatric: Denies confusion, memory loss or sensory loss. Endo: Denies change in weight, skin or hair change.  Heme/Lymph: No excessive bleeding, bruising or enlarged lymph nodes.  Physical Exam  There were no vitals taken for this visit.  Appears  well nourished, well groomed  and in no distress.  Eyes: PERRLA, EOMs, conjunctiva no swelling or erythema. Sinuses: No frontal/maxillary tenderness ENT/Mouth: EAC's clear, TM's nl w/o erythema, bulging. Nares clear w/o erythema, swelling, exudates. Oropharynx clear without erythema or exudates. Oral hygiene is good. Tongue normal, non obstructing. Hearing intact.  Neck: Supple. Thyroid not palpable. Car 2+/2+ without bruits, nodes or JVD. Chest: Respirations nl with BS clear & equal w/o rales, rhonchi, wheezing or stridor.  Cor: Heart sounds normal w/ regular rate and rhythm without sig. murmurs, gallops, clicks or rubs. Peripheral pulses normal and equal  without edema.  Abdomen: Soft & bowel sounds normal. Non-tender w/o guarding, rebound, hernias, masses or organomegaly.  Lymphatics: Unremarkable.  Musculoskeletal: Full ROM all peripheral extremities, joint stability, 5/5 strength and normal gait.  Skin: Warm, dry without  exposed rashes, lesions or ecchymosis apparent.  Neuro: Cranial nerves intact, reflexes equal bilaterally. Sensory-motor testing grossly intact. Tendon reflexes grossly intact.  Pysch: Alert & oriented x 3.  Insight and judgement nl & appropriate. No ideations.  Assessment and Plan:   1. Essential hypertension  - Continue medication, monitor blood pressure at home.  - Continue DASH diet.  Reminder to go to the ER if any CP,  SOB, nausea, dizziness, severe HA, changes vision/speech.    - CBC with Differential/Platelet - COMPLETE METABOLIC PANEL WITH GFR - Magnesium - TSH  2. Hyperlipidemia, mixed    - Continue diet/meds, exercise,& lifestyle modifications.  - Continue monitor periodic cholesterol/liver & renal functions       - Lipid panel - TSH - Lipoprotein A (LPA)  3. Abnormal glucose  - Continue diet, exercise  - Lifestyle modifications.  - Monitor appropriate labs    - COMPLETE METABOLIC PANEL WITH GFR - Hemoglobin A1c - Insulin, random  4. Vitamin D deficiency  - Continue supplementation    - VITAMIN D 25 Hydroxy   5. Aortic atherosclerosis by Abd CT scan in 2018  [facility])  - Lipid panel - Lipoprotein A (LPA)  6. Myalgia  - Lipid panel  7. myopathy  - Lipid panel  8. Statin Intolerance  - Lipid panel  9. Gastroesophageal reflux disease - CBC with Differential/Platelet  10. OSA on CPAP   11. Recurrent mild major depressive disorder with anxiety (HCC)  - TSH  12. Medication management  - CBC with Differential/Platelet - COMPLETE METABOLIC PANEL WITH GFR - Magnesium - Lipid panel - TSH - Hemoglobin A1c - Insulin, random - VITAMIN D 25 Hydroxy          Discussed  regular exercise, BP monitoring, weight control to achieve/maintain BMI less than 25 and discussed med and SE's. Recommended labs to assess and monitor clinical status with further disposition pending results of labs.  I discussed the assessment and treatment plan with the  patient. The patient was  provided an opportunity to ask questions and all were answered. The patient agreed with the plan and demonstrated an understanding of the instructions.  I provided over 30 minutes of exam, counseling, chart review and  complex critical decision making.        The patient was advised to call back or seek an in-person evaluation if the symptoms worsen or if the condition fails to improve as anticipated.   Marinus Maw, MD

## 2022-11-20 NOTE — Patient Instructions (Signed)

## 2022-11-21 ENCOUNTER — Other Ambulatory Visit: Payer: Self-pay | Admitting: Nurse Practitioner

## 2022-11-21 DIAGNOSIS — F5101 Primary insomnia: Secondary | ICD-10-CM

## 2022-11-21 LAB — COMPLETE METABOLIC PANEL WITH GFR
AG Ratio: 1.5 (calc) (ref 1.0–2.5)
ALT: 12 U/L (ref 6–29)
AST: 18 U/L (ref 10–35)
Albumin: 4.1 g/dL (ref 3.6–5.1)
Alkaline phosphatase (APISO): 72 U/L (ref 37–153)
BUN: 21 mg/dL (ref 7–25)
CO2: 26 mmol/L (ref 20–32)
Calcium: 9.6 mg/dL (ref 8.6–10.4)
Chloride: 106 mmol/L (ref 98–110)
Creat: 0.89 mg/dL (ref 0.60–1.00)
Globulin: 2.7 g/dL (calc) (ref 1.9–3.7)
Glucose, Bld: 85 mg/dL (ref 65–99)
Potassium: 4 mmol/L (ref 3.5–5.3)
Sodium: 142 mmol/L (ref 135–146)
Total Bilirubin: 0.4 mg/dL (ref 0.2–1.2)
Total Protein: 6.8 g/dL (ref 6.1–8.1)
eGFR: 69 mL/min/{1.73_m2} (ref >=60)

## 2022-11-21 LAB — CBC WITH DIFFERENTIAL/PLATELET
Absolute Monocytes: 502 cells/uL (ref 200–950)
Basophils Absolute: 37 cells/uL (ref 0–200)
Basophils Relative: 0.6 %
Eosinophils Absolute: 130 cells/uL (ref 15–500)
Eosinophils Relative: 2.1 %
HCT: 39.1 % (ref 35.0–45.0)
Hemoglobin: 13 g/dL (ref 11.7–15.5)
Lymphs Abs: 2610 cells/uL (ref 850–3900)
MCH: 26.2 pg — ABNORMAL LOW (ref 27.0–33.0)
MCHC: 33.2 g/dL (ref 32.0–36.0)
MCV: 78.8 fL — ABNORMAL LOW (ref 80.0–100.0)
MPV: 10.7 fL (ref 7.5–12.5)
Monocytes Relative: 8.1 %
Neutro Abs: 2920 cells/uL (ref 1500–7800)
Neutrophils Relative %: 47.1 %
Platelets: 233 10*3/uL (ref 140–400)
RBC: 4.96 10*6/uL (ref 3.80–5.10)
RDW: 14.9 % (ref 11.0–15.0)
Total Lymphocyte: 42.1 %
WBC: 6.2 10*3/uL (ref 3.8–10.8)

## 2022-11-21 LAB — HEMOGLOBIN A1C
Hgb A1c MFr Bld: 5.8 % of total Hgb — ABNORMAL HIGH (ref <5.7)
Mean Plasma Glucose: 120 mg/dL
eAG (mmol/L): 6.6 mmol/L

## 2022-11-21 LAB — MAGNESIUM: Magnesium: 1.9 mg/dL (ref 1.5–2.5)

## 2022-11-21 LAB — LIPID PANEL
Cholesterol: 188 mg/dL (ref <200)
HDL: 80 mg/dL (ref >=50)
LDL Cholesterol (Calc): 91 mg/dL (calc)
Non-HDL Cholesterol (Calc): 108 mg/dL (calc) (ref <130)
Total CHOL/HDL Ratio: 2.4 (calc) (ref <5.0)
Triglycerides: 83 mg/dL (ref <150)

## 2022-11-21 LAB — INSULIN, RANDOM: Insulin: 5.3 u[IU]/mL

## 2022-11-21 LAB — TSH: TSH: 0.9 mIU/L (ref 0.40–4.50)

## 2022-11-21 LAB — VITAMIN D 25 HYDROXY (VIT D DEFICIENCY, FRACTURES): Vit D, 25-Hydroxy: 72 ng/mL (ref 30–100)

## 2022-11-21 NOTE — Progress Notes (Signed)
^<^<^<^<^<^<^<^<^<^<^<^<^<^<^<^<^<^<^<^<^<^<^<^<^<^<^<^<^<^<^<^<^<^<^<^<^ ^>^>^>^>^>^>^>^>^>^>^>>^>^>^>^>^>^>^>^>^>^>^>^>^>^>^>^>^>^>^>^>^>^>^>^>^>  -  Test results slightly outside the reference range are not unusual. If there is anything important, I will review this with you,  otherwise it is considered normal test values.  If you have further questions,  please do not hesitate to contact me at the office or via My Chart.   ^<^<^<^<^<^<^<^<^<^<^<^<^<^<^<^<^<^<^<^<^<^<^<^<^<^<^<^<^<^<^<^<^<^<^<^<^ ^>^>^>^>^>^>^>^>^>^>^>^>^>^>^>^>^>^>^>^>^>^>^>^>^>^>^>^>^>^>^>^>^>^>^>^>^  -  CBC- is OK   - A1c = 5.8% - is 12 week average blood sugar is slightly elevated, So    - Avoid Sweets, Candy & White Stuff   - White Rice, White Potatoes, White Flour  - Breads &  Pasta ^<^<^<^<^<^<^<^<^<^<^<^<^<^<^<^<^<^<^<^<^<^<^<^<^<^<^<^<^<^<^<^<^<^<^<^<^ ^>^>^>^>^>^>^>^>^>^>^>^>^>^>^>^>^>^>^>^>^>^>^>^>^>^>^>^>^>^>^>^>^>^>^>^>^  - Vitamin D = 72 - Excellent - Please continue dosage same   ^<^<^<^<^<^<^<^<^<^<^<^<^<^<^<^<^<^<^<^<^<^<^<^<^<^<^<^<^<^<^<^<^<^<^<^<^ ^>^>^>^>^>^>^>^>^>^>^>^>^>^>^>^>^>^>^>^>^>^>^>^>^>^>^>^>^>^>^>^>^>^>^>^>^  - Chol = 188  Excellent   - Very low risk for Heart Attack  / Stroke  ^<^<^<^<^<^<^<^<^<^<^<^<^<^<^<^<^<^<^<^<^<^<^<^<^<^<^<^<^<^<^<^<^<^<^<^<^ ^>^>^>^>^>^>^>^>^>^>^>^>^>^>^>^>^>^>^>^>^>^>^>^>^>^>^>^>^>^>^>^>^>^>^>^>^  - All Else - CBC - Kidneys - Electrolytes - Liver - Magnesium & Thyroid    - all  Normal / OK ^<^<^<^<^<^<^<^<^<^<^<^<^<^<^<^<^<^<^<^<^<^<^<^<^<^<^<^<^<^<^<^<^<^<^<^<^ ^>^>^>^>^>^>^>^>^>^>^>^>^>^>^>^>^>^>^>^>^>^>^>^>^>^>^>^>^>^>^>^>^>^>^>^>^

## 2022-11-27 ENCOUNTER — Other Ambulatory Visit: Payer: Self-pay | Admitting: Nurse Practitioner

## 2022-11-27 DIAGNOSIS — J301 Allergic rhinitis due to pollen: Secondary | ICD-10-CM | POA: Diagnosis not present

## 2022-11-27 DIAGNOSIS — F5101 Primary insomnia: Secondary | ICD-10-CM

## 2022-11-27 DIAGNOSIS — J3089 Other allergic rhinitis: Secondary | ICD-10-CM | POA: Diagnosis not present

## 2022-11-27 DIAGNOSIS — J3081 Allergic rhinitis due to animal (cat) (dog) hair and dander: Secondary | ICD-10-CM | POA: Diagnosis not present

## 2022-11-27 LAB — LIPOPROTEIN A (LPA): Lipoprotein (a): 12 nmol/L (ref <75)

## 2022-11-27 NOTE — Progress Notes (Signed)
^<^<^<^<^<^<^<^<^<^<^<^<^<^<^<^<^<^<^<^<^<^<^<^<^<^<^<^<^<^<^<^<^<^<^<^<^ ^>^>^>^>^>^>^>^>^>^>^>>^>^>^>^>^>^>^>^>^>^>^>^>^>^>^>^>^>^>^>^>^>^>^>^>^>  -   LP- a is very Low  - - >> Wonderful - means very low risk for Heart Disease  ^<^<^<^<^<^<^<^<^<^<^<^<^<^<^<^<^<^<^<^<^<^<^<^<^<^<^<^<^<^<^<^<^<^<^<^<^ ^>^>^>^>^>^>^>^>^>^>^>^>^>^>^>^>^>^>^>^>^>^>^>^>^>^>^>^>^>^>^>^>^>^>^>^>^

## 2022-11-28 MED ORDER — CLONAZEPAM 0.5 MG PO TABS
ORAL_TABLET | ORAL | 0 refills | Status: AC
Start: 2022-11-28 — End: ?

## 2022-11-30 ENCOUNTER — Ambulatory Visit: Payer: Medicare Other | Admitting: Student

## 2022-12-03 DIAGNOSIS — J301 Allergic rhinitis due to pollen: Secondary | ICD-10-CM | POA: Diagnosis not present

## 2022-12-03 DIAGNOSIS — J3081 Allergic rhinitis due to animal (cat) (dog) hair and dander: Secondary | ICD-10-CM | POA: Diagnosis not present

## 2022-12-03 DIAGNOSIS — J3089 Other allergic rhinitis: Secondary | ICD-10-CM | POA: Diagnosis not present

## 2022-12-04 ENCOUNTER — Ambulatory Visit: Payer: Medicare Other | Admitting: General Practice

## 2022-12-10 ENCOUNTER — Other Ambulatory Visit: Payer: Self-pay | Admitting: Nurse Practitioner

## 2022-12-10 DIAGNOSIS — F17200 Nicotine dependence, unspecified, uncomplicated: Secondary | ICD-10-CM

## 2022-12-11 DIAGNOSIS — J301 Allergic rhinitis due to pollen: Secondary | ICD-10-CM | POA: Diagnosis not present

## 2022-12-11 DIAGNOSIS — J3089 Other allergic rhinitis: Secondary | ICD-10-CM | POA: Diagnosis not present

## 2022-12-11 NOTE — Telephone Encounter (Signed)
Overdue for follow up. Needs OV. I've sent 30 day supply in interim.

## 2022-12-11 NOTE — Telephone Encounter (Signed)
Please advise on refill request

## 2022-12-17 DIAGNOSIS — J3081 Allergic rhinitis due to animal (cat) (dog) hair and dander: Secondary | ICD-10-CM | POA: Diagnosis not present

## 2022-12-17 DIAGNOSIS — J3089 Other allergic rhinitis: Secondary | ICD-10-CM | POA: Diagnosis not present

## 2022-12-17 DIAGNOSIS — J301 Allergic rhinitis due to pollen: Secondary | ICD-10-CM | POA: Diagnosis not present

## 2022-12-27 ENCOUNTER — Other Ambulatory Visit: Payer: Self-pay | Admitting: Acute Care

## 2022-12-27 DIAGNOSIS — F17210 Nicotine dependence, cigarettes, uncomplicated: Secondary | ICD-10-CM

## 2022-12-27 DIAGNOSIS — Z122 Encounter for screening for malignant neoplasm of respiratory organs: Secondary | ICD-10-CM

## 2022-12-27 DIAGNOSIS — Z87891 Personal history of nicotine dependence: Secondary | ICD-10-CM

## 2023-01-01 DIAGNOSIS — H35362 Drusen (degenerative) of macula, left eye: Secondary | ICD-10-CM | POA: Diagnosis not present

## 2023-01-01 DIAGNOSIS — H3581 Retinal edema: Secondary | ICD-10-CM | POA: Diagnosis not present

## 2023-01-03 NOTE — Progress Notes (Unsigned)
Cardiology Office Note:    Date:  01/06/2023   ID:  [name], DOB [birth year], MRN [id number]  PCP:  Lucky Cowboy, MD   [facility] Providers Cardiologist:  Olga Millers, MD { Click to update primary MD,subspecialty MD or APP then REFRESH:1}    Referring MD: Lucky Cowboy, MD   No chief complaint on file. ***  History of Present Illness:    [name] is a 72 y.o. female with a hx of coronary artery calcification and aortic atherosclerosis on CT 05/2019, COPD, DM, HLD, and HTN.  Stress echo nonischemic 04/2017. Echo 11/2019 with normal LVEF, mild LVH, grade 1 DD, mild AI. Mild bilateral carotid artery stenosis 12/2019.   Pt was last seen in 2022.   She presents today for overdue follow up.      CAD Seen on CT 2020 Normal stress echo 2018 Risk factor modification Continue ASA   Hyperlipidemia with LDL goal < 70 11/20/2022: Cholesterol 188; HDL 80; LDL Cholesterol (Calc) 91; Triglycerides 83 On praluent   Hx of retinal artery occlusion ASA and plavix (plavix added by Dr. Oneta Rack)   Hypertension Bisoprolol-hydrochlorothiazide, losartan 100 mg daily, nifedipine      Past Medical History:  Diagnosis Date   Allergy    enviromental   Anxiety    Arthritis    osteoarthritis   COPD (chronic obstructive pulmonary disease) (HCC)    mild   Diabetes mellitus without complication (HCC)    controlled by diet   Environmental allergies    GERD (gastroesophageal reflux disease)    Hepatitis    hepatitis A   History of colon polyps    Hyperlipidemia    Hypertension    IBS (irritable bowel syndrome)    Mixed hyperlipidemia 06/15/2013   PONV (postoperative nausea and vomiting)    second surgery no problems last surgery   Solitary pulmonary nodule 06/02/2019   Urgency of urination    Takes oxybutynin; under control at this time    Past Surgical History:  Procedure Laterality Date   APPENDECTOMY     removed as child   BACK SURGERY      lumbar fusion   CATARACT EXTRACTION Left 2019   COLONOSCOPY     DILATION AND CURETTAGE OF UTERUS     LUMBAR FUSION  02/06/2012   L 4  L5   PARTIAL HYSTERECTOMY     POLYPECTOMY     TOTAL HIP ARTHROPLASTY  06/02/2012   Procedure: TOTAL HIP ARTHROPLASTY;  Surgeon: Nestor Lewandowsky, MD;  Location: [facility] OR;  Service: Orthopedics;  Laterality: Right;   TOTAL HIP ARTHROPLASTY Left 01/25/2016   TOTAL HIP ARTHROPLASTY Left 01/25/2016   Procedure: LEFT TOTAL HIP ARTHROPLASTY;  Surgeon: Gean Birchwood, MD;  Location: [facility] OR;  Service: Orthopedics;  Laterality: Left;   TOTAL KNEE ARTHROPLASTY Right 12/15/2012   Procedure: TOTAL KNEE ARTHROPLASTY;  Surgeon: Nestor Lewandowsky, MD;  Location: [facility] OR;  Service: Orthopedics;  Laterality: Right;   TOTAL KNEE ARTHROPLASTY Left 06/18/2016   Procedure: TOTAL KNEE ARTHROPLASTY;  Surgeon: Gean Birchwood, MD;  Location: [facility] OR;  Service: Orthopedics;  Laterality: Left;   UPPER GASTROINTESTINAL ENDOSCOPY  05/06/2019    Current Medications: No outpatient medications have been marked as taking for the 01/09/23 encounter (Appointment) with Marcelino Duster, PA.     Allergies:   Enalapril, Statins, and Repatha [evolocumab]   Social History   Socioeconomic History   Marital status: Married    Spouse name: Not on file  Number of children: 2   Years of education: Not on file   Highest education level: Not on file  Occupational History   Not on file  Tobacco Use   Smoking status: Every Day    Packs/day: 1.00    Years: 35.00    Additional pack years: 0.00    Total pack years: 35.00    Types: Cigarettes    Start date: 103   Smokeless tobacco: Never   Tobacco comments:    10 cigarettes per day 05/02/2022 PAP  Vaping Use   Vaping Use: Never used  Substance and Sexual Activity   Alcohol use: Yes    Comment: 2-4 beers per night, advised to cut back    Drug use: Yes    Types: Marijuana    Comment: current, 5 joints/month    Sexual activity: Yes    Birth  control/protection: Surgical  Other Topics Concern   Not on file  Social History Narrative   Not on file   Social Determinants of Health   Financial Resource Strain: Not on file  Food Insecurity: Not on file  Transportation Needs: Not on file  Physical Activity: Not on file  Stress: Not on file  Social Connections: Not on file     Family History: The patient's ***family history includes CAD in her father; Cancer in her maternal grandmother; Colon cancer (age of onset: 55) in her sister; Colon cancer (age of onset: 20) in her mother; Diabetes in her mother; Hypertension in her brother, brother, and father; Lung cancer (age of onset: 51) in her brother; Peripheral Artery Disease in her paternal grandfather. There is no history of Esophageal cancer, Rectal cancer, or Stomach cancer.  ROS:   Please see the history of present illness.    *** All other systems reviewed and are negative.  EKGs/Labs/Other Studies Reviewed:    The following studies were reviewed today: ***      Recent Labs: 11/20/2022: ALT 12; BUN 21; Creat 0.89; Hemoglobin 13.0; Magnesium 1.9; Platelets 233; Potassium 4.0; Sodium 142; TSH 0.90  Recent Lipid Panel    Component Value Date/Time   CHOL 188 11/20/2022 1433   TRIG 83 11/20/2022 1433   HDL 80 11/20/2022 1433   CHOLHDL 2.4 11/20/2022 1433   VLDL 18 12/18/2016 1225   LDLCALC 91 11/20/2022 1433     Risk Assessment/Calculations:   {Does this patient have ATRIAL FIBRILLATION?[phone]}  No BP recorded.  {Refresh Note OR Click here to enter BP  :1}***         Physical Exam:    VS:  There were no vitals taken for this visit.    Wt Readings from Last 3 Encounters:  11/20/22 159 lb 3.2 oz (72.2 kg)  11/13/22 160 lb 9.6 oz (72.8 kg)  09/20/22 156 lb 6.4 oz (70.9 kg)     GEN: *** Well nourished, well developed in no acute distress HEENT: Normal NECK: No JVD; No carotid bruits LYMPHATICS: No lymphadenopathy CARDIAC: ***RRR, no murmurs, rubs,  gallops RESPIRATORY:  Clear to auscultation without rales, wheezing or rhonchi  ABDOMEN: Soft, non-tender, non-distended MUSCULOSKELETAL:  No edema; No deformity  SKIN: Warm and dry NEUROLOGIC:  Alert and oriented x 3 PSYCHIATRIC:  Normal affect   ASSESSMENT:    No diagnosis found. PLAN:    In order of problems listed above:  ***      {Are you ordering a CV Procedure (e.g. stress test, cath, DCCV, TEE, etc)?   Press F2        :  [id number]}    Medication Adjustments/Labs and Tests Ordered: Current medicines are reviewed at length with the patient today.  Concerns regarding medicines are outlined above.  No orders of the defined types were placed in this encounter.  No orders of the defined types were placed in this encounter.   There are no Patient Instructions on file for this visit.   Signed, Marcelino Duster, [place]  01/06/2023 8:46 PM    [facility]

## 2023-01-07 ENCOUNTER — Emergency Department (HOSPITAL_COMMUNITY)
Admission: EM | Admit: 2023-01-07 | Discharge: 2023-01-08 | Disposition: A | Discharge status: Home / Self Care | Payer: Medicare Other | Attending: Emergency Medicine | Admitting: Emergency Medicine

## 2023-01-07 ENCOUNTER — Encounter (HOSPITAL_COMMUNITY): Payer: Self-pay

## 2023-01-07 ENCOUNTER — Other Ambulatory Visit: Payer: Self-pay

## 2023-01-07 DIAGNOSIS — R55 Syncope and collapse: Secondary | ICD-10-CM | POA: Insufficient documentation

## 2023-01-07 DIAGNOSIS — I10 Essential (primary) hypertension: Secondary | ICD-10-CM | POA: Insufficient documentation

## 2023-01-07 DIAGNOSIS — Z7951 Long term (current) use of inhaled steroids: Secondary | ICD-10-CM | POA: Diagnosis not present

## 2023-01-07 DIAGNOSIS — I6523 Occlusion and stenosis of bilateral carotid arteries: Secondary | ICD-10-CM | POA: Diagnosis not present

## 2023-01-07 DIAGNOSIS — R002 Palpitations: Secondary | ICD-10-CM | POA: Diagnosis not present

## 2023-01-07 DIAGNOSIS — J449 Chronic obstructive pulmonary disease, unspecified: Secondary | ICD-10-CM | POA: Diagnosis not present

## 2023-01-07 DIAGNOSIS — F17200 Nicotine dependence, unspecified, uncomplicated: Secondary | ICD-10-CM | POA: Insufficient documentation

## 2023-01-07 DIAGNOSIS — Z7982 Long term (current) use of aspirin: Secondary | ICD-10-CM | POA: Diagnosis not present

## 2023-01-07 DIAGNOSIS — Z79899 Other long term (current) drug therapy: Secondary | ICD-10-CM | POA: Insufficient documentation

## 2023-01-07 LAB — URINALYSIS, ROUTINE W REFLEX MICROSCOPIC
Bilirubin Urine: NEGATIVE
Glucose, UA: NEGATIVE mg/dL
Hgb urine dipstick: NEGATIVE
Ketones, ur: NEGATIVE mg/dL
Leukocytes,Ua: NEGATIVE
Nitrite: NEGATIVE
Protein, ur: NEGATIVE mg/dL
Specific Gravity, Urine: 1.012 (ref 1.005–1.030)
pH: 5 (ref 5.0–8.0)

## 2023-01-07 LAB — CBC
HCT: 37.5 % (ref 36.0–46.0)
Hemoglobin: 12.6 g/dL (ref 12.0–15.0)
MCH: 26.2 pg (ref 26.0–34.0)
MCHC: 33.6 g/dL (ref 30.0–36.0)
MCV: 78 fL — ABNORMAL LOW (ref 80.0–100.0)
Platelets: 258 10*3/uL (ref 150–400)
RBC: 4.81 MIL/uL (ref 3.87–5.11)
RDW: 15 % (ref 11.5–15.5)
WBC: 7.9 10*3/uL (ref 4.0–10.5)
nRBC: 0 % (ref 0.0–0.2)

## 2023-01-07 LAB — BASIC METABOLIC PANEL
Anion gap: 11 (ref 5–15)
BUN: 22 mg/dL (ref 8–23)
CO2: 22 mmol/L (ref 22–32)
Calcium: 9.1 mg/dL (ref 8.9–10.3)
Chloride: 104 mmol/L (ref 98–111)
Creatinine, Ser: 1.04 mg/dL — ABNORMAL HIGH (ref 0.44–1.00)
GFR, Estimated: 57 mL/min — ABNORMAL LOW (ref >60)
Glucose, Bld: 115 mg/dL — ABNORMAL HIGH (ref 70–99)
Potassium: 3.5 mmol/L (ref 3.5–5.1)
Sodium: 137 mmol/L (ref 135–145)

## 2023-01-07 LAB — CBG MONITORING, ED: Glucose-Capillary: 115 mg/dL — ABNORMAL HIGH (ref 70–99)

## 2023-01-07 NOTE — ED Triage Notes (Signed)
Patient reports tonight she felt lightheaded while sitting on couch and she had syncopal episode. EMS came to home but patient refused transport. States she vomited as well, husband states she appeared diaphoretic.

## 2023-01-08 ENCOUNTER — Emergency Department (HOSPITAL_COMMUNITY): Payer: Medicare Other

## 2023-01-08 DIAGNOSIS — I6523 Occlusion and stenosis of bilateral carotid arteries: Secondary | ICD-10-CM | POA: Diagnosis not present

## 2023-01-08 DIAGNOSIS — R55 Syncope and collapse: Secondary | ICD-10-CM | POA: Diagnosis not present

## 2023-01-08 NOTE — Progress Notes (Signed)
Cardiology Office Note:  .   Date:  01/09/2023  ID:  [name], DOB 03-05-1951, MRN [id number] PCP: Lucky Cowboy, MD  [facility] Providers Cardiologist:  Olga Millers, MD { Click to update primary MD,subspecialty MD or APP then REFRESH:1}   History of Present Illness: .   [name] is a 72 y.o. female with past medical history of CAD, HTN, HLD with statin intolerance, DM and COPD.   First seen by Dr. Jens Som in 03/29/17 for chest pain, felt to be atypical, exercise treadmill for risk stratification was ordered. Her stress echo revealed hypertensive response but no ischemia on 05/13/17. Her echocardiogram was normal without wall motion abnormalities, normal LV systolic function with EF of 55 to 60%. Following she was started on atorvastatin and zetia, then rosuvastatin, she did not tolerate statin therapy. Was then started on repatha, did not tolerate. She had a CT chest for lung cancer screening that noted aortic atherosclerosis, LAD, left circumflex, and RCA calcifications.   In 11/2019 she was noted to have blocked blood vessels in her left eye. A repeat echo at that time indicated an EF of 60 to 65% with mild left ventricular hypertrophy, Grade I diastolic dysfunction. She had bilateral carotid dopplers completed that indicated bilateral 1-39% stenosis.   Last seen via telehealth visit by Dr. Jens Som on 08/25/20. She denied dyspnea, chest pain, palpitations or syncope.   Presented to ED at [facility] on 6/24 following a witnessed syncopal event. Per chart review she ate dinner then went outside to her porch to relax. Then had a witnessed syncopal episode by her son in which she was staring blankly and non responsive for several minutes, after coming to was fatigued. Denied precipitating factors. In the ED had returned to her normal. Her EKG indicated NSR with borderline intraventricular conduction delay, labs are unremarkable. Head CT indicated no acute intracranial  abnormality.   Today she reports she is doing very well.  She has no cardiac concerns or complaints.  She denies chest pain or shortness of breath.  Reports a month ago she was taking meloxicam this resulted in a intermittent fluttery feeling in her chest, she discontinued a week ago.  Notes only 1 or 2 episodes of fluttering sensation in her chest that last 1-2 heartbeats. She denies dizziness, lightheadedness, weight gain, edema or early satiety.  On discussion regarding her syncopal event on 6/24 she reports that she was attempting to stand became lightheaded then sat back down, then remembers her son being on the phone with EMS.  Per her son she was unresponsive for what seemed like 10 minutes, eyes were open and glazed.  Reports she had eaten dinner a few hours prior and only had 1 glass of wine, denied use of Klonopin. ED workup was unremarkable, as noted above.  ROS: She denies chest pain, dyspnea, pnd, orthopnea, dizziness, edema, weight gain, or early satiety. All other systems reviewed and are otherwise negative except as noted above.    Studies Reviewed: .       Cardiac Studies & Procedures     STRESS TESTS  ECHOCARDIOGRAM STRESS TEST 05/14/2017  Narrative *[facility]* [address] [place], [place] [address] [phone]  ------------------------------------------------------------------- Stress Echocardiography  Patient:    [name] MR #:       [id number] Study Date: 05/13/2017 Gender:     F Age:        44 Height:     167.6 cm Weight:  76.8 kg BSA:        1.91 m^2 Pt. Status: Room:  ATTENDING    Olga Millers ORDERING     Arlys John Crenshaw REFERRING    Olga Millers SONOGRAPHER  Dewitt Hoes, RDCS PERFORMING   [facility], Outpatient  cc:  -------------------------------------------------------------------  ------------------------------------------------------------------- Indications:      Abnormal stress  (R94.39).  ------------------------------------------------------------------- Study Conclusions  - Stress ECG conclusions: There were no stress arrhythmias or conduction abnormalities. The stress ECG was negative for ischemia. - Staged echo: There was no echocardiographic evidence for stress-induced ischemia.  Impressions:  - Stress echo with no chest pain; hypertensive BP response; no diagnostic ST changes (increased inferolateral TWI in recovery felt to be nondiagnostic); no obvious stress-induced wall motion abnormalities.  ------------------------------------------------------------------- Study data:   Study status:  Routine.  Consent:  The risks, benefits, and alternatives to the procedure were explained to the patient and informed consent was obtained.  Procedure:  The patient reported no pain pre or post test. Initial setup. The patient was brought to the laboratory. A baseline ECG was recorded. Surface ECG leads and automatic cuff blood pressure measurements were monitored. Treadmill exercise testing was performed using the Bruce protocol. The patient exercised for 4 min 58 sec, to protocol stage 2, to a maximal work rate of 6.3 mets. Exercise was terminated due to achievement of target heart rate and dyspnea. The patient was positioned for image acquisition and recovery monitoring. Transthoracic stress echocardiography. Images were captured at baseline and peak exercise.  Study completion:  The patient tolerated the procedure well. There were no complications. Bruce protocol. Stress echocardiography.  Birthdate:  Patient birthdate: [birth year].  Age:  Patient is 72 yr old.  Sex:  Gender: female.    BMI: 27.3 kg/m^2.  Patient status:  Outpatient.  Study date:  Study date: 05/13/2017. Study time: 03:01 PM.  -------------------------------------------------------------------  ------------------------------------------------------------------- Baseline ECG:   Normal.  ------------------------------------------------------------------- Stress protocol:  +---------------------+---+------------+--------+ !Stage                !HR !BP (mmHg)   !Symptoms! +---------------------+---+------------+--------+ !Baseline             !86 !172/81 (111)!None    ! +---------------------+---+------------+--------+ !Stage 1              !139!166/95 (119)!--------! +---------------------+---+------------+--------+ !Stage 2              !171!------------!--------! +---------------------+---+------------+--------+ !Immediate post stress!148!------------!--------! +---------------------+---+------------+--------+ !Recovery; 1 min      !130!216/80 (125)!--------! +---------------------+---+------------+--------+ !Recovery; 2 min      !107!193/77 (116)!--------! +---------------------+---+------------+--------+ !Recovery; 3 min      !96 !------------!--------! +---------------------+---+------------+--------+ !Recovery; 5 min      !95 !155/68 (97) !--------! +---------------------+---+------------+--------+  ------------------------------------------------------------------- Stress results:   Maximal heart rate during stress was 171 bpm (111% of maximal predicted heart rate). The maximal predicted heart rate was 154 bpm.The target heart rate was achieved. The heart rate response to stress was normal. There was resting hypertension with a hypertensive response to stress. The rate-pressure product for the peak heart rate and blood pressure was 40981 mm Hg/min.  The patient experienced no chest pain during stress.  ------------------------------------------------------------------- Stress ECG:  There were no stress arrhythmias or conduction abnormalities.  The stress ECG was negative for ischemia.  ------------------------------------------------------------------- Baseline:  - LV size was normal. - LV global systolic function was normal. The estimated LV  ejection fraction was 60%. - Normal wall motion; no LV regional wall motion abnormalities.  Peak stress:  - LV size was normal. -  LV global systolic function was at the upper limits of normal. The estimated LV ejection fraction was 70%. - No evidence for new LV regional wall motion abnormalities.  ------------------------------------------------------------------- Stress echo results:     Left ventricular ejection fraction was normal at rest and with stress. There was no echocardiographic evidence for stress-induced ischemia.  ------------------------------------------------------------------- Prepared and Electronically Authenticated by  Olga Millers 2018-10-29T16:46:35   ECHOCARDIOGRAM  ECHOCARDIOGRAM COMPLETE 12/11/2019  Narrative ECHOCARDIOGRAM REPORT    Patient Name:   [name] Date of Exam: 12/11/2019 Medical Rec #:  [id number]        Height:       66.0 in Accession #:    [id number]       Weight:       168.2 lb Date of Birth:  1951-01-14        BSA:          1.858 m Patient Age:    69 years         BP:           130/76 mmHg Patient Gender: F                HR:           69 bpm. Exam Location:  [address]  Procedure: 2D Echo, Cardiac Doppler and Color Doppler  Indications:    H34.9  History:        Patient has prior history of Echocardiogram examinations, most recent 04/03/2017. COPD; Risk Factors:Current Smoker, Hypertension and Dyslipidemia.  Sonographer:    Samule Ohm RDCS Referring Phys: [id number] BRIAN S CRENSHAW  IMPRESSIONS   1. Left ventricular ejection fraction, by estimation, is 60 to 65%. The left ventricle has normal function. The left ventricle has no regional wall motion abnormalities. There is mild left ventricular hypertrophy. Left ventricular diastolic parameters are consistent with Grade I diastolic dysfunction (impaired relaxation). 2. Right ventricular systolic function is normal. The right ventricular size is normal. 3. The  mitral valve is normal in structure. No evidence of mitral valve regurgitation. No evidence of mitral stenosis. 4. The aortic valve is tricuspid. Aortic valve regurgitation is mild. No aortic stenosis is present. 5. The inferior vena cava is normal in size with greater than 50% respiratory variability, suggesting right atrial pressure of 3 mmHg.  Comparison(s): No significant change from prior study. Prior images reviewed side by side.  FINDINGS Left Ventricle: Left ventricular ejection fraction, by estimation, is 60 to 65%. The left ventricle has normal function. The left ventricle has no regional wall motion abnormalities. The left ventricular internal cavity size was normal in size. There is mild left ventricular hypertrophy. Left ventricular diastolic parameters are consistent with Grade I diastolic dysfunction (impaired relaxation).  Right Ventricle: The right ventricular size is normal. No increase in right ventricular wall thickness. Right ventricular systolic function is normal.  Left Atrium: Left atrial size was normal in size.  Right Atrium: Right atrial size was normal in size.  Pericardium: There is no evidence of pericardial effusion.  Mitral Valve: The mitral valve is normal in structure. Normal mobility of the mitral valve leaflets. No evidence of mitral valve regurgitation. No evidence of mitral valve stenosis.  Tricuspid Valve: The tricuspid valve is normal in structure. Tricuspid valve regurgitation is not demonstrated. No evidence of tricuspid stenosis.  Aortic Valve: The aortic valve is tricuspid. Aortic valve regurgitation is mild. Aortic regurgitation PHT measures 1132 msec. No aortic stenosis is present.  Pulmonic Valve: The pulmonic valve  was normal in structure. Pulmonic valve regurgitation is not visualized. No evidence of pulmonic stenosis.  Aorta: The aortic root is normal in size and structure.  Venous: The inferior vena cava is normal in size with greater  than 50% respiratory variability, suggesting right atrial pressure of 3 mmHg.  IAS/Shunts: No atrial level shunt detected by color flow Doppler.   LEFT VENTRICLE PLAX 2D LVIDd:         3.90 cm  Diastology LVIDs:         2.50 cm  LV e' lateral:   6.85 cm/s LV PW:         1.40 cm  LV E/e' lateral: 7.9 LV IVS:        1.50 cm  LV e' medial:    4.46 cm/s LVOT diam:     2.30 cm  LV E/e' medial:  12.2 LV SV:         87 LV SV Index:   47 LVOT Area:     4.15 cm   RIGHT VENTRICLE            IVC RV S prime:     9.79 cm/s  IVC diam: 1.10 cm TAPSE (M-mode): 1.9 cm  LEFT ATRIUM             Index       RIGHT ATRIUM           Index LA diam:        3.40 cm 1.83 cm/m  RA Pressure: 3.00 mmHg LA Vol (A2C):   57.2 ml 30.79 ml/m RA Area:     13.80 cm LA Vol (A4C):   42.2 ml 22.71 ml/m RA Volume:   32.90 ml  17.71 ml/m LA Biplane Vol: 51.0 ml 27.45 ml/m AORTIC VALVE LVOT Vmax:   109.00 cm/s LVOT Vmean:  68.100 cm/s LVOT VTI:    0.209 m AI PHT:      1132 msec  AORTA Ao Root diam: 3.70 cm Ao Asc diam:  3.60 cm  MV E velocity: 54.20 cm/s  TRICUSPID VALVE MV A velocity: 84.20 cm/s  Estimated RAP:  3.00 mmHg MV E/A ratio:  0.64 SHUNTS Systemic VTI:  0.21 m Systemic Diam: 2.30 cm  Donato Schultz MD Electronically signed by Donato Schultz MD Signature Date/Time: 12/11/2019/4:26:08 PM    Final                 Physical Exam:   VS:  BP 120/70 (BP Location: Left Arm, Patient Position: Sitting, Cuff Size: Normal)   Pulse 68   Ht 5\' 5"  (1.651 m)   Wt 159 lb 12.8 oz (72.5 kg)   SpO2 94%   BMI 26.59 kg/m    Wt Readings from Last 3 Encounters:  01/09/23 159 lb 12.8 oz (72.5 kg)  01/07/23 162 lb (73.5 kg)  11/20/22 159 lb 3.2 oz (72.2 kg)    GEN: Well nourished, well developed in no acute distress NECK: No JVD; No carotid bruits CARDIAC: RRR, no murmurs, rubs, gallops RESPIRATORY:  Clear to auscultation without rales, wheezing or rhonchi  ABDOMEN: Soft, non-tender,  non-distended EXTREMITIES:  No edema; No deformity   ASSESSMENT AND PLAN: .    Syncopal event: She had a syncopal event on 6/24, reports she was trying to stand from a seated position became lightheaded, sat down, next thing she remembers her son was on the phone with 911.  She denied feelings of palpitations or chest pain prior to or following the event.  Her  ED workup was unremarkable. She had a recent TSH by primary care in May that was normal. She reports chest fluttering's while taking Meloxicam, she has discontinued use, noted 1-2 fluttering's lasting a few seconds since discontinuation. Orthostatics today were negative. Discussed that according to [facility] DMV she cannot drive for 6 months. Will assess for arrhythmias with ZIO-XT for two weeks. If further presyncope or syncope can consider echo or coronary CTA.   Orthostatic VS for the past 24 hrs (Last 3 readings):  BP- Lying Pulse- Lying BP- Sitting Pulse- Sitting BP- Standing at 0 minutes Pulse- Standing at 0 minutes BP- Standing at 3 minutes Pulse- Standing at 3 minutes  01/09/23 1611 136/72 62 133/75 63 153/74 66 149/81 63     Palpitations: Reports occasional fluttering sensation in her chest during meloxicam use. Notes she recently discontinued, following discontinuation has only had 1-2 episodes that last 1-2 beats.  Labs in the emergency room were unremarkable she had normal TSH in May. Will assess for arrhythmias with ZIO-XT for two weeks.  CAD: Noted on CT chest for lung cancer screenings, aortic atherosclerosis, LAD, left circumflex and RCA calcifications. Stable with no anginal symptoms. No indication currently for ischemic evaluation. If develops new symptoms or further presyncope or syncope can consider coronary CTA. Continue bisoprolol-hydrochlorothiazide, Plavix, losartan, nifedipine, Praluent, omega-3 fatty acid.  HTN: Blood pressure well-controlled today at 120/70 reports home blood pressures average 130/80.  Continue  bisoprolol-hydrochlorothiazide, Plavix, losartan, nifedipine, Praluent, omega-3 fatty acid.  HLD: History of statin intolerance and repatha intolerance. Previously tried Zetia but does not remember discontinuation, note review indicates possible muscle cramps. Last LDL 91 and cholesterol 188. Recommended that given three vessel CAD (incidental finding from CT) and history of tobacco abuse her LDL goal should be below 70. She is agreeable to starting Zetia 10mg  daily. Continue Praluent. Plan for repeat labs at follow up.   History of retinal artery occlusion: Continue aspirin and plavix (initiaed by Dr. Oneta Rack)   Tobacco abuse: Reports current smoking, discussed cessation, she is not interested at this time.      Dispo: Follow up with Bernadene Person, NP or Reather Littler, NP in 6 weeks.   Signed, Rip Harbour, NP

## 2023-01-08 NOTE — ED Provider Notes (Signed)
[place] EMERGENCY DEPARTMENT AT [facility] Provider Note   CSN: [id number] Arrival date & time: 01/07/23  2116     History  Chief Complaint  Patient presents with   Loss of Consciousness    [name] is a 72 y.o. female.  The history is provided by the patient and medical records. No language interpreter was used.  Loss of Consciousness    72 year old female significant history of hypertension, hyperlipidemia, COPD, obstructive sleep apnea, tobacco use presents to the ED for evaluation of syncope.  Patient reports she was of her normal self throughout the day today.  She cooked dinner and ate dinner and afterwards she went outside to her porch to relax.  She had an apparent witnessed syncopal episode by her son in which she was staring blankly into space and was not responsive for several minutes.  When she came to, she felt really tired.  She however denies any tongue biting or urinating on self.  She denies falling or injuring herself.  No prior history of seizure or history of syncope.  She denies any precipitating prior to her episode.  She admits to tobacco use smoking approximately half a pack a day.  She states she has 2 alcoholic beverage which is usual for her.  She denies any other significant changes.  States that she did start taking meloxicam couple weeks ago but did stop when she felt some heart palpitation.  She is currently on Plavix.  EMS did arrive to tonight but patient request her son to bring her here for evaluation.  At this time she felt like she is at her normal self.  Home Medications Prior to Admission medications   Medication Sig Start Date End Date Taking? Authorizing Provider  albuterol (VENTOLIN HFA) 108 (90 Base) MCG/ACT inhaler Inhale 2 puffs into the lungs every 6 (six) hours as needed for wheezing or shortness of breath. 12/27/21   Cobb, Ruby Cola, NP  Ascorbic Acid (VITAMIN C) 1000 MG tablet Take 1,000 mg by mouth daily.     [provider]  aspirin 81 MG tablet Take 81 mg by mouth every other day.     [provider]  bisoprolol-hydrochlorothiazide [facility]) 10-6.25 MG tablet TAKE 1/2 TABLET BY MOUTH DAILY 10/30/22   Lewayne Bunting, MD  buPROPion (WELLBUTRIN XL) 150 MG 24 hr tablet Take  1 tablet  2 x /day  for Mood, Focus & Concentration                             /                                                                     TAKE                                  BY                                           MOUTH 05/12/22   Lucky Cowboy, MD  cetirizine (ZYRTEC) 10 MG tablet Take 10 mg by mouth daily.    [provider]  Cholecalciferol (VITAMIN D3) 5000 UNITS TABS Take 5,000 Units by mouth daily.    [provider]  clonazePAM (KLONOPIN) 0.5 MG tablet TAKE 1/2 TO 1 TABLET BY MOUTH ONCE  DAILY ONLY IF NEEDED FOR ANXIETY ATTACK AND LIMIT TO 5 DAYS PER WEEK TO AVOID ADDICTION AND DEMENTIA 11/28/22   Raynelle Dick, NP  clopidogrel (PLAVIX) 75 MG tablet Take  1 tablet  Daily  to Prevent Blood Clots 11/20/22   Lucky Cowboy, MD  Cyanocobalamin (VITAMIN B12 PO) Take 1,000 mcg by mouth daily.    [provider]  fluticasone Aleda Grana) 50 MCG/ACT nasal spray 1 Spray each nostril daily 10/05/19   Coral Ceo, NP  hydrocortisone (ANUSOL-HC) 2.5 % rectal cream Place 1 Application rectally 2 (two) times daily. Apply to external hemorrhoids 3 to 4  x / day 11/13/22   Lucky Cowboy, MD  losartan (COZAAR) 100 MG tablet Take  1 tablet Daily for BP                                          /                      TAKE                          BY                                MOUTH 02/14/22   Lucky Cowboy, MD  meloxicam (MOBIC) 7.5 MG tablet Take  1 tablet   Daily   with Food for Pain & Inflammation                                                 /                                                      TAKE                                                   BY                                                         MOUTH 09/15/22   Lucky Cowboy, MD  methylcellulose oral powder Take by mouth daily.     [provider]  montelukast (SINGULAIR) 10 MG tablet Take 1 tablet (10 mg total) by mouth at bedtime. 05/02/22   Leslye Peer, MD  Multiple Vitamins-Minerals (MULTI COMPLETE PO) Take 1 capsule by mouth daily.  [provider]  NIFEdipine (PROCARDIA XL/NIFEDICAL XL) 60 MG 24 hr tablet Take 1 tablet every night for BP 11/20/22   Lucky Cowboy, MD  Omega-3 Fatty Acids (FISH OIL) 1000 MG CAPS Take 1 capsule (1,000 mg total) by mouth 2 (two) times daily with a meal. 08/11/19   Lewayne Bunting, MD  omeprazole (PRILOSEC) 40 MG capsule Take  1 capsule  2 x /day to Prevent Heartburn & Indigestion                          /                                              TAKE                                             BY                                     MOUTH 05/12/22   Lucky Cowboy, MD  PRALUENT 150 MG/ML SOAJ INJECT 150 MG INTO THE SKIN EVERY 14 DAYS 03/21/22   Lewayne Bunting, MD  Tiotropium Bromide-Olodaterol (STIOLTO RESPIMAT) 2.5-2.5 MCG/ACT AERS Inhale 2 puffs into the lungs daily. 12/27/21   Cobb, Ruby Cola, NP  Zinc 50 MG TABS Take by mouth daily.    [provider]      Allergies    Enalapril, Statins, and Repatha [evolocumab]    Review of Systems   Review of Systems  Cardiovascular:  Positive for syncope.  All other systems reviewed and are negative.   Physical Exam Updated Vital Signs BP (!) 161/66   Pulse 74   Temp 98 F (36.7 C)   Resp 16   Ht 5\' 5"  (1.651 m)   Wt 73.5 kg   SpO2 94%   BMI 26.96 kg/m  Physical Exam Vitals and nursing note reviewed.  Constitutional:      General: She is not in acute distress.    Appearance: She is well-developed.  HENT:     Head: Normocephalic and atraumatic.  Eyes:     Extraocular Movements: Extraocular movements intact.     Conjunctiva/sclera: Conjunctivae normal.     Pupils:  Pupils are equal, round, and reactive to light.  Cardiovascular:     Rate and Rhythm: Normal rate and regular rhythm.     Pulses: Normal pulses.     Heart sounds: Normal heart sounds.  Pulmonary:     Effort: Pulmonary effort is normal.  Abdominal:     Palpations: Abdomen is soft.  Musculoskeletal:        General: Normal range of motion.     Cervical back: Normal range of motion and neck supple.     Comments: 5 out of 5 strength to all 4 extremities  Skin:    Findings: No rash.  Neurological:     Mental Status: She is alert and oriented to person, place, and time.     GCS: GCS eye subscore is 4. GCS verbal subscore is 5. GCS motor subscore is 6.     Cranial Nerves: Cranial nerves 2-12 are intact.  Sensory: Sensation is intact.     Motor: Motor function is intact.     Coordination: Coordination is intact.  Psychiatric:        Mood and Affect: Mood normal.     ED Results / Procedures / Treatments   Labs (all labs ordered are listed, but only abnormal results are displayed) Labs Reviewed  BASIC METABOLIC PANEL - Abnormal; Notable for the following components:      Result Value   Glucose, Bld 115 (*)    Creatinine, Ser 1.04 (*)    GFR, Estimated 57 (*)    All other components within normal limits  CBC - Abnormal; Notable for the following components:   MCV 78.0 (*)    All other components within normal limits  CBG MONITORING, ED - Abnormal; Notable for the following components:   Glucose-Capillary 115 (*)    All other components within normal limits  URINALYSIS, ROUTINE W REFLEX MICROSCOPIC    EKG EKG Interpretation  Date/Time:  Monday January 07 2023 21:40:50 EDT Ventricular Rate:  71 PR Interval:  208 QRS Duration: 114 QT Interval:  379 QTC Calculation: 412 R Axis:   6 Text Interpretation: Sinus rhythm Borderline intraventricular conduction delay Confirmed by Tilden Fossa [phone]) on 01/08/2023 1:23:07 AM ED ECG REPORT   Date: 01/08/2023  Rate: 71  Rhythm:  normal sinus rhythm  QRS Axis: normal  Intervals: normal  ST/T Wave abnormalities: normal  Conduction Disutrbances:nonspecific intraventricular conduction delay  Narrative Interpretation:   Old EKG Reviewed: unchanged  I have personally reviewed the EKG tracing and agree with the computerized printout as noted.   Radiology CT Head Wo Contrast  Result Date: 01/08/2023 CLINICAL DATA:  Syncope/presyncope, cerebrovascular cause suspected EXAM: CT HEAD WITHOUT CONTRAST TECHNIQUE: Contiguous axial images were obtained from the base of the skull through the vertex without intravenous contrast. RADIATION DOSE REDUCTION: This exam was performed according to the departmental dose-optimization program which includes automated exposure control, adjustment of the mA and/or kV according to patient size and/or use of iterative reconstruction technique. COMPARISON:  CT paranasal 05/18/2022 FINDINGS: Brain: Patchy and confluent areas of decreased attenuation are noted throughout the deep and periventricular white matter of the cerebral hemispheres bilaterally, compatible with chronic microvascular ischemic disease. No evidence of large-territorial acute infarction. No parenchymal hemorrhage. No mass lesion. No extra-axial collection. No mass effect or midline shift. No hydrocephalus. Basilar cisterns are patent. Vascular: No hyperdense vessel. Atherosclerotic calcifications are present within the cavernous internal carotid arteries. Skull: No acute fracture or focal lesion. Sinuses/Orbits: Paranasal sinuses and mastoid air cells are clear. The orbits are unremarkable. Other: None. IMPRESSION: No acute intracranial abnormality. Electronically Signed   By: Tish Frederickson M.D.   On: 01/08/2023 01:37    Procedures Procedures    Medications Ordered in ED Medications - No data to display  ED Course/ Medical Decision Making/ A&P                             Medical Decision Making Amount and/or Complexity of  Data Reviewed Labs: ordered. Radiology: ordered.   BP (!) 152/79   Pulse 73   Temp 98 F (36.7 C)   Resp 18   Ht 5\' 5"  (1.651 m)   Wt 73.5 kg   SpO2 97%   BMI 26.96 kg/m   20:31 AM  72 year old female significant history of hypertension, hyperlipidemia, COPD, obstructive sleep apnea, tobacco use presents to the ED for evaluation of  syncope.  Patient reports she was of her normal self throughout the day today.  She cooked dinner and ate dinner and afterwards she went outside to her porch to relax.  She had an apparent witnessed syncopal episode by her son in which she was staring blankly into space and was not responsive for several minutes.  When she came to, she felt really tired.  She however denies any tongue biting or urinating on self.  She denies falling or injuring herself.  No prior history of seizure or history of syncope.  She denies any precipitating prior to her episode.  She admits to tobacco use smoking approximately half a pack a day.  She states she has 2 alcoholic beverage which is usual for her.  She denies any other significant changes.  States that she did start taking meloxicam couple weeks ago but did stop when she felt some heart palpitation.  She is currently on Plavix.  EMS did arrive to tonight but patient request her son to bring her here for evaluation.  At this time she felt like she is at her normal self.  On exam this is a well-appearing elderly female appears to be in no acute discomfort.  She does not have any signs of injury on initial exam.  She is alert and oriented x 4 without any focal neurodeficit.  Heart with normal rate and rhythm, lungs clear to auscultation bilaterally abdomen is soft nontender 5 out of 5 strength all 4 extremities.  -Labs ordered, independently viewed and interpreted by me.  Labs remarkable for reassuring labs -The patient was maintained on a cardiac monitor.  I personally viewed and interpreted the cardiac monitored which showed an  underlying rhythm of: NSR -Imaging independently viewed and interpreted by me and I agree with radiologist's interpretation.  Result remarkable for head CT without concerning feature -This patient presents to the ED for concern of syncope, this involves an extensive number of treatment options, and is a complaint that carries with it a high risk of complications and morbidity.  The differential diagnosis includes syncope, seizure, anemia, hypoglycemia, electrolytes imbalance, stroke, MI -Co morbidities that complicate the patient evaluation includes DM, GERD, IBS -Treatment includes none -Reevaluation of the patient after these medicines showed that the patient resolved -PCP office notes or outside notes reviewed -Discussion with specialist attending DR. Madilyn Hook -Escalation to admission/observation considered: patients feels much better, is comfortable with discharge, and will follow up with PCP -Prescription medication considered, patient comfortable with OTC -Social Determinant of Health considered which includes tobacco use         Final Clinical Impression(s) / ED Diagnoses Final diagnoses:  Syncope and collapse    Rx / DC Orders ED Discharge Orders     None         Fayrene Helper, PA-C 01/08/23 0321    Tilden Fossa, MD 01/10/23 [phone]

## 2023-01-08 NOTE — ED Notes (Signed)
MD verbalized pt is discharged. However, pt did not want to wait for discharge pts. MD and RN at bedside and stated pt can look at information in her mychart.

## 2023-01-09 ENCOUNTER — Ambulatory Visit: Payer: Medicare Other | Attending: Student | Admitting: Cardiology

## 2023-01-09 ENCOUNTER — Ambulatory Visit (INDEPENDENT_AMBULATORY_CARE_PROVIDER_SITE_OTHER): Payer: Medicare Other

## 2023-01-09 ENCOUNTER — Encounter: Payer: Self-pay | Admitting: Physician Assistant

## 2023-01-09 VITALS — BP 120/70 | HR 68 | Ht 65.0 in | Wt 159.8 lb

## 2023-01-09 DIAGNOSIS — R002 Palpitations: Secondary | ICD-10-CM

## 2023-01-09 DIAGNOSIS — Z720 Tobacco use: Secondary | ICD-10-CM | POA: Diagnosis not present

## 2023-01-09 DIAGNOSIS — I10 Essential (primary) hypertension: Secondary | ICD-10-CM | POA: Diagnosis not present

## 2023-01-09 DIAGNOSIS — I2510 Atherosclerotic heart disease of native coronary artery without angina pectoris: Secondary | ICD-10-CM | POA: Diagnosis not present

## 2023-01-09 DIAGNOSIS — R55 Syncope and collapse: Secondary | ICD-10-CM

## 2023-01-09 DIAGNOSIS — I700 Atherosclerosis of aorta: Secondary | ICD-10-CM | POA: Diagnosis not present

## 2023-01-09 DIAGNOSIS — E782 Mixed hyperlipidemia: Secondary | ICD-10-CM

## 2023-01-09 MED ORDER — EZETIMIBE 10 MG PO TABS: 10.0000 mg | ORAL_TABLET | Freq: Every day | ORAL | 3 refills | Status: DC

## 2023-01-09 NOTE — Patient Instructions (Signed)
Medication Instructions:  Start Zetia 10 mg daily  *If you need a refill on your cardiac medications before your next appointment, please call your pharmacy*   Lab Work: NONE ordered at this time of appointment    Testing/Procedures: ZIO AT Long term monitor-Live Telemetry  Your physician has requested you wear a ZIO patch monitor for 14 days.  This is a single patch monitor. [employer] supplies one patch monitor per enrollment. Additional  stickers are not available.  Please do not apply patch if you will be having a Nuclear Stress Test, Echocardiogram, Cardiac CT, MRI,  or Chest Xray during the period you would be wearing the monitor. The patch cannot be worn during  these tests. You cannot remove and re-apply the ZIO AT patch monitor.  Your ZIO patch monitor will be mailed 3 day USPS to your address on file. It may take 3-5 days to  receive your monitor after you have been enrolled.  Once you have received your monitor, please review the enclosed instructions. Your monitor has  already been registered assigning a specific monitor serial # to you.   Billing and Patient Assistance Program information  Meredeth Ide has been supplied with any insurance information on record for billing. [employer] offers a sliding scale Patient Assistance Program for patients without insurance, or whose  insurance does not completely cover the cost of the ZIO patch monitor. You must apply for the  Patient Assistance Program to qualify for the discounted rate. To apply, call [employer] at [phone],  select option 4, select option 2 , ask to apply for the Patient Assistance Program, (you can request an  interpreter if needed). [employer] will ask your household income and how many people are in your  household. [employer] will quote your out-of-pocket cost based on this information. They will also be able  to set up a 12 month interest free payment plan if needed.  Applying the monitor   Shave hair from upper left  chest.  Hold the abrader disc by orange tab. Rub the abrader in 40 strokes over left upper chest as indicated in  your monitor instructions.  Clean area with 4 enclosed alcohol pads. Use all pads to ensure the area is cleaned thoroughly. Let  dry.  Apply patch as indicated in monitor instructions. Patch will be placed under collarbone on left side of  chest with arrow pointing upward.  Rub patch adhesive wings for 2 minutes. Remove the white label marked "1". Remove the white label  marked "2". Rub patch adhesive wings for 2 additional minutes.  While looking in a mirror, press and release button in center of patch. A small green light will flash 3-4  times. This will be your only indicator that the monitor has been turned on.  Do not shower for the first 24 hours. You may shower after the first 24 hours.  Press the button if you feel a symptom. You will hear a small click. Record Date, Time and Symptom in  the Patient Log.   Starting the Gateway  In your kit there is a Audiological scientist box the size of a cellphone. This is Buyer, retail. It transmits all your  recorded data to [facility]. This box must always stay within 10 feet of you. Open the box and push the *  button. There will be a light that blinks orange and then green a few times. When the light stops  blinking, the Gateway is connected to the ZIO patch. Call [employer] at [phone]  to confirm your monitor is transmitting.  Returning your monitor  Remove your patch and place it inside the Gateway. In the lower half of the Gateway there is a white  bag with prepaid postage on it. Place Gateway in bag and seal. Mail package back to [place] as soon as  possible. Your physician should have your final report approximately 7 days after you have mailed back  your monitor. Call [facility] Customer Care at [phone] if you have questions regarding your ZIO AT  patch monitor. Call them immediately if you see an orange light  blinking on your monitor.  If your monitor falls off in less than 4 days, contact our Monitor department at [phone]. If your  monitor becomes loose or falls off after 4 days call [employer] at [phone] for suggestions on  securing your monitor    Follow-Up: At [facility], you and your health needs are our priority.  As part of our continuing mission to provide you with exceptional heart care, we have created designated Provider Care Teams.  These Care Teams include your primary Cardiologist (physician) and Advanced Practice Providers (APPs -  Physician Assistants and Nurse Practitioners) who all work together to provide you with the care you need, when you need it.  We recommend signing up for the patient portal called "MyChart".  Sign up information is provided on this After Visit Summary.  MyChart is used to connect with patients for Virtual Visits (Telemedicine).  Patients are able to view lab/test results, encounter notes, upcoming appointments, etc.  Non-urgent messages can be sent to your provider as well.   To learn more about what you can do with MyChart, go to [email or web address].    Your next appointment:   6 week(s)  Provider:   Bernadene Person, NP        Other Instructions

## 2023-01-09 NOTE — Progress Notes (Unsigned)
Enrolled for [employer] to mail a ZIO XT long term holter monitor to the patients address on file.   Dr. Crenshaw to read. 

## 2023-01-09 NOTE — Progress Notes (Deleted)
ER follow up  Assessment and Plan: Hospital visit follow up for:      All medications were reviewed with patient and family and fully reconciled. All questions answered fully, and patient and family members were encouraged to call the office with any further questions or concerns. Discussed goal to avoid readmission related to this diagnosis.      Over 40 minutes of exam, counseling, chart review, and complex, high/moderate level critical decision making was performed this visit.   Future Appointments  Date Time Provider Department Center  01/09/2023  3:10 PM Marcelino Duster, [place] [facility] None  01/10/2023  3:30 PM Raynelle Dick, NP [facility] None  01/25/2023 11:40 AM [facility] 2 [facility] [facility]  02/27/2023 10:30 AM Adela Glimpse, NP [facility] None  06/06/2023 10:00 AM Adela Glimpse, NP [facility] None  11/13/2023 11:00 AM Adela Glimpse, NP [facility] None     HPI 72 y.o.female presents for follow up from ER visit  on 01/08/23 .The discharge summary, medications, and diagnostic test results were reviewed before meeting with the patient. The patient was admitted for:    72 year old female significant history of hypertension, hyperlipidemia, COPD, obstructive sleep apnea, tobacco use presents to the ED for evaluation of syncope.  Patient reports she was of her normal self throughout the day today.  She cooked dinner and ate dinner and afterwards she went outside to her porch to relax.  She had an apparent witnessed syncopal episode by her son in which she was staring blankly into space and was not responsive for several minutes.  When she came to, she felt really tired.  She however denies any tongue biting or urinating on self.  She denies falling or injuring herself.  No prior history of seizure or history of syncope.  She denies any precipitating prior to her episode.  She admits to tobacco use smoking approximately half a pack a day.  She states she has 2 alcoholic  beverage which is usual for her.  She denies any other significant changes.  States that she did start taking meloxicam couple weeks ago but did stop when she felt some heart palpitation.  She is currently on Plavix.  EMS did arrive to tonight but patient request her son to bring her here for evaluation.  At this time she felt like she is at her normal self.   On exam this is a well-appearing elderly female appears to be in no acute discomfort.  She does not have any signs of injury on initial exam.  She is alert and oriented x 4 without any focal neurodeficit.  Heart with normal rate and rhythm, lungs clear to auscultation bilaterally abdomen is soft nontender 5 out of 5 strength all 4 extremities.   -Labs ordered, independently viewed and interpreted by me.  Labs remarkable for reassuring labs -The patient was maintained on a cardiac monitor.  I personally viewed and interpreted the cardiac monitored which showed an underlying rhythm of: NSR -Imaging independently viewed and interpreted by me and I agree with radiologist's interpretation.  Result remarkable for head CT without concerning feature -This patient presents to the ED for concern of syncope, this involves an extensive number of treatment options, and is a complaint that carries with it a high risk of complications and morbidity.  The differential diagnosis includes syncope, seizure, anemia, hypoglycemia, electrolytes imbalance, stroke, MI -Co morbidities that complicate the patient evaluation includes DM, GERD, IBS -Treatment includes none -Reevaluation of the patient after these medicines showed  that the patient resolved -PCP office notes or outside notes reviewed -Discussion with specialist attending DR. Madilyn Hook -Escalation to admission/observation considered: patients feels much better, is comfortable with discharge, and will follow up with PCP -Prescription medication considered, patient comfortable with OTC -Social Determinant of Health  considered which includes tobacco use    Home health {ACTION; IS/IS [id number]} involved.   Images while in the hospital: CT Head Wo Contrast  Result Date: 01/08/2023 CLINICAL DATA:  Syncope/presyncope, cerebrovascular cause suspected EXAM: CT HEAD WITHOUT CONTRAST TECHNIQUE: Contiguous axial images were obtained from the base of the skull through the vertex without intravenous contrast. RADIATION DOSE REDUCTION: This exam was performed according to the departmental dose-optimization program which includes automated exposure control, adjustment of the mA and/or kV according to patient size and/or use of iterative reconstruction technique. COMPARISON:  CT paranasal 05/18/2022 FINDINGS: Brain: Patchy and confluent areas of decreased attenuation are noted throughout the deep and periventricular white matter of the cerebral hemispheres bilaterally, compatible with chronic microvascular ischemic disease. No evidence of large-territorial acute infarction. No parenchymal hemorrhage. No mass lesion. No extra-axial collection. No mass effect or midline shift. No hydrocephalus. Basilar cisterns are patent. Vascular: No hyperdense vessel. Atherosclerotic calcifications are present within the cavernous internal carotid arteries. Skull: No acute fracture or focal lesion. Sinuses/Orbits: Paranasal sinuses and mastoid air cells are clear. The orbits are unremarkable. Other: None. IMPRESSION: No acute intracranial abnormality. Electronically Signed   By: Tish Frederickson M.D.   On: 01/08/2023 01:37      Current Outpatient Medications (Cardiovascular):    bisoprolol-hydrochlorothiazide (ZIAC) 10-6.25 MG tablet, TAKE 1/2 TABLET BY MOUTH DAILY   losartan (COZAAR) 100 MG tablet, Take  1 tablet Daily for BP                                          /                      TAKE                          BY                                MOUTH   NIFEdipine (PROCARDIA XL/NIFEDICAL XL) 60 MG 24 hr tablet, Take 1 tablet every  night for BP   PRALUENT 150 MG/ML SOAJ, INJECT 150 MG INTO THE SKIN EVERY 14 DAYS  Current Outpatient Medications (Respiratory):    albuterol (VENTOLIN HFA) 108 (90 Base) MCG/ACT inhaler, Inhale 2 puffs into the lungs every 6 (six) hours as needed for wheezing or shortness of breath.   cetirizine (ZYRTEC) 10 MG tablet, Take 10 mg by mouth daily.   fluticasone (FLONASE) 50 MCG/ACT nasal spray, 1 Spray each nostril daily   montelukast (SINGULAIR) 10 MG tablet, Take 1 tablet (10 mg total) by mouth at bedtime.   Tiotropium Bromide-Olodaterol (STIOLTO RESPIMAT) 2.5-2.5 MCG/ACT AERS, Inhale 2 puffs into the lungs daily.  Current Outpatient Medications (Analgesics):    aspirin 81 MG tablet, Take 81 mg by mouth every other day.    meloxicam (MOBIC) 7.5 MG tablet, Take  1 tablet   Daily   with Food for Pain & Inflammation                                                 /  TAKE                                                   BY                                                        MOUTH  Current Outpatient Medications (Hematological):    clopidogrel (PLAVIX) 75 MG tablet, Take  1 tablet  Daily  to Prevent Blood Clots   Cyanocobalamin (VITAMIN B12 PO), Take 1,000 mcg by mouth daily.  Current Outpatient Medications (Other):    Ascorbic Acid (VITAMIN C) 1000 MG tablet, Take 1,000 mg by mouth daily.   buPROPion (WELLBUTRIN XL) 150 MG 24 hr tablet, Take  1 tablet  2 x /day  for Mood, Focus & Concentration                             /                                                                     TAKE                                  BY                                           MOUTH   Cholecalciferol (VITAMIN D3) 5000 UNITS TABS, Take 5,000 Units by mouth daily.   clonazePAM (KLONOPIN) 0.5 MG tablet, TAKE 1/2 TO 1 TABLET BY MOUTH ONCE  DAILY ONLY IF NEEDED FOR ANXIETY ATTACK AND LIMIT TO 5 DAYS PER WEEK TO AVOID ADDICTION AND DEMENTIA    hydrocortisone (ANUSOL-HC) 2.5 % rectal cream, Place 1 Application rectally 2 (two) times daily. Apply to external hemorrhoids 3 to 4  x / day   methylcellulose oral powder, Take by mouth daily.    Multiple Vitamins-Minerals (MULTI COMPLETE PO), Take 1 capsule by mouth daily.   Omega-3 Fatty Acids (FISH OIL) 1000 MG CAPS, Take 1 capsule (1,000 mg total) by mouth 2 (two) times daily with a meal.   omeprazole (PRILOSEC) 40 MG capsule, Take  1 capsule  2 x /day to Prevent Heartburn & Indigestion                          /                                              TAKE  BY                                     MOUTH   Zinc 50 MG TABS, Take by mouth daily.  Past Medical History:  Diagnosis Date   Allergy    enviromental   Anxiety    Arthritis    osteoarthritis   COPD (chronic obstructive pulmonary disease) (HCC)    mild   Diabetes mellitus without complication (HCC)    controlled by diet   Environmental allergies    GERD (gastroesophageal reflux disease)    Hepatitis    hepatitis A   History of colon polyps    Hyperlipidemia    Hypertension    IBS (irritable bowel syndrome)    Mixed hyperlipidemia 06/15/2013   PONV (postoperative nausea and vomiting)    second surgery no problems last surgery   Solitary pulmonary nodule 06/02/2019   Urgency of urination    Takes oxybutynin; under control at this time     Allergies  Allergen Reactions   Enalapril Swelling    Caused lips to swell and bloating.   Statins     Tingling with several statins   Repatha [Evolocumab]     Stated she didn't tolerate it and switched to Praluent    ROS: all negative except above.   Physical Exam: There were no vitals filed for this visit. There were no vitals taken for this visit. General Appearance: Well nourished, in no apparent distress. Eyes: PERRLA, EOMs, conjunctiva no swelling or erythema Sinuses: No Frontal/maxillary tenderness ENT/Mouth: Ext aud  canals clear, TMs without erythema, bulging. No erythema, swelling, or exudate on post pharynx.  Tonsils not swollen or erythematous. Hearing normal.  Neck: Supple, thyroid normal.  Respiratory: Respiratory effort normal, BS equal bilaterally without rales, rhonchi, wheezing or stridor.  Cardio: RRR with no MRGs. Brisk peripheral pulses without edema.  Abdomen: Soft, + BS.  Non tender, no guarding, rebound, hernias, masses. Lymphatics: Non tender without lymphadenopathy.  Musculoskeletal: Full ROM, 5/5 strength, normal gait.  Skin: Warm, dry without rashes, lesions, ecchymosis.  Neuro: Cranial nerves intact. Normal muscle tone, no cerebellar symptoms. Sensation intact.  Psych: Awake and oriented X 3, normal affect, Insight and Judgment appropriate.     Raynelle Dick, NP 12:33 PM [facility]

## 2023-01-10 ENCOUNTER — Encounter: Payer: Self-pay | Admitting: Cardiology

## 2023-01-10 ENCOUNTER — Ambulatory Visit: Payer: Medicare Other | Admitting: Nurse Practitioner

## 2023-01-10 DIAGNOSIS — J301 Allergic rhinitis due to pollen: Secondary | ICD-10-CM | POA: Diagnosis not present

## 2023-01-10 DIAGNOSIS — I10 Essential (primary) hypertension: Secondary | ICD-10-CM

## 2023-01-10 DIAGNOSIS — J3089 Other allergic rhinitis: Secondary | ICD-10-CM | POA: Diagnosis not present

## 2023-01-10 DIAGNOSIS — R55 Syncope and collapse: Secondary | ICD-10-CM

## 2023-01-10 DIAGNOSIS — I700 Atherosclerosis of aorta: Secondary | ICD-10-CM

## 2023-01-10 DIAGNOSIS — J3081 Allergic rhinitis due to animal (cat) (dog) hair and dander: Secondary | ICD-10-CM | POA: Diagnosis not present

## 2023-01-10 DIAGNOSIS — R7309 Other abnormal glucose: Secondary | ICD-10-CM

## 2023-01-10 DIAGNOSIS — J432 Centrilobular emphysema: Secondary | ICD-10-CM

## 2023-01-15 ENCOUNTER — Telehealth: Payer: Self-pay

## 2023-01-15 DIAGNOSIS — J3089 Other allergic rhinitis: Secondary | ICD-10-CM | POA: Diagnosis not present

## 2023-01-15 DIAGNOSIS — J3081 Allergic rhinitis due to animal (cat) (dog) hair and dander: Secondary | ICD-10-CM | POA: Diagnosis not present

## 2023-01-15 DIAGNOSIS — J301 Allergic rhinitis due to pollen: Secondary | ICD-10-CM | POA: Diagnosis not present

## 2023-01-15 NOTE — Telephone Encounter (Signed)
Transition Care Management Follow-up Telephone Call Date of discharge and from where: 01/08/2023 [facility] How have you been since you were released from the hospital? Patient is feeling better Any questions or concerns? No  Items Reviewed: Did the pt receive and understand the discharge instructions provided? Yes  Medications obtained and verified? Yes  Other? No  Any new allergies since your discharge? No  Dietary orders reviewed? Yes Do you have support at home? Yes   Follow up appointments reviewed:  PCP Hospital f/u appt confirmed? No  Scheduled to see  on  @ . Specialist Hospital f/u appt confirmed? Yes  Scheduled to see Reather Littler, NP on 01/09/2023 @ [facility].. Are transportation arrangements needed? No  If their condition worsens, is the pt aware to call PCP or go to the Emergency Dept.? Yes Was the patient provided with contact information for the PCP's office or ED? Yes Was to pt encouraged to call back with questions or concerns? Yes  [name] [name]  [facility] Community Resource Care Guide   ??[name]  ?? [name]   Website: [email or web address]  [email or web address]

## 2023-01-22 DIAGNOSIS — J3081 Allergic rhinitis due to animal (cat) (dog) hair and dander: Secondary | ICD-10-CM | POA: Diagnosis not present

## 2023-01-22 DIAGNOSIS — R55 Syncope and collapse: Secondary | ICD-10-CM | POA: Diagnosis not present

## 2023-01-22 DIAGNOSIS — J301 Allergic rhinitis due to pollen: Secondary | ICD-10-CM | POA: Diagnosis not present

## 2023-01-22 DIAGNOSIS — R002 Palpitations: Secondary | ICD-10-CM

## 2023-01-22 DIAGNOSIS — J3089 Other allergic rhinitis: Secondary | ICD-10-CM | POA: Diagnosis not present

## 2023-01-25 ENCOUNTER — Inpatient Hospital Stay: Admission: RE | Admit: 2023-01-25 | Payer: Medicare Other | Source: Ambulatory Visit

## 2023-01-28 ENCOUNTER — Telehealth: Payer: Self-pay | Admitting: Cardiology

## 2023-01-28 NOTE — Telephone Encounter (Signed)
Spoke to the patient, c/o headaches for the past  few weeks and right leg cramps/ toes since yesterday. Pt  started Zetia 10 mg on 6/26 and stated she is associating her symptoms with medication. Will forward to APP and pharm D for advise.

## 2023-01-28 NOTE — Telephone Encounter (Signed)
Pt c/o medication issue:  1. Name of Medication: ezetimibe (ZETIA) 10 MG tablet   2. How are you currently taking this medication (dosage and times per day)? Not taking   3. Are you having a reaction (difficulty breathing--STAT)?   4. What is your medication issue? Pt was taking medication as prescribed however she noticed headaches and cramps in her toes and legs. Pt states she hasn't taken any medication today. Please [name] e

## 2023-01-28 NOTE — Telephone Encounter (Signed)
Please have her stop Zetia, she has follow up with Bernadene Person, NP the beginning of next month. We can re-evaluate at that appointment.

## 2023-01-29 NOTE — Telephone Encounter (Signed)
Pt returned call. Pt will hold off on taking the Zetia at this time and will follow up with Bernadene Person NP on 02/20/2023. Pt voiced understanding.

## 2023-01-29 NOTE — Telephone Encounter (Signed)
Lmom, waiting on a return call to discuss medication instructions per [facility] NP.

## 2023-01-30 DIAGNOSIS — J3089 Other allergic rhinitis: Secondary | ICD-10-CM | POA: Diagnosis not present

## 2023-01-30 DIAGNOSIS — J301 Allergic rhinitis due to pollen: Secondary | ICD-10-CM | POA: Diagnosis not present

## 2023-01-30 DIAGNOSIS — J3081 Allergic rhinitis due to animal (cat) (dog) hair and dander: Secondary | ICD-10-CM | POA: Diagnosis not present

## 2023-02-05 DIAGNOSIS — R002 Palpitations: Secondary | ICD-10-CM | POA: Diagnosis not present

## 2023-02-05 DIAGNOSIS — R55 Syncope and collapse: Secondary | ICD-10-CM | POA: Diagnosis not present

## 2023-02-06 ENCOUNTER — Other Ambulatory Visit: Payer: Self-pay | Admitting: Internal Medicine

## 2023-02-06 DIAGNOSIS — J3089 Other allergic rhinitis: Secondary | ICD-10-CM | POA: Diagnosis not present

## 2023-02-06 DIAGNOSIS — J3081 Allergic rhinitis due to animal (cat) (dog) hair and dander: Secondary | ICD-10-CM | POA: Diagnosis not present

## 2023-02-06 DIAGNOSIS — J301 Allergic rhinitis due to pollen: Secondary | ICD-10-CM | POA: Diagnosis not present

## 2023-02-12 DIAGNOSIS — J3089 Other allergic rhinitis: Secondary | ICD-10-CM | POA: Diagnosis not present

## 2023-02-12 DIAGNOSIS — J3081 Allergic rhinitis due to animal (cat) (dog) hair and dander: Secondary | ICD-10-CM | POA: Diagnosis not present

## 2023-02-12 DIAGNOSIS — J301 Allergic rhinitis due to pollen: Secondary | ICD-10-CM | POA: Diagnosis not present

## 2023-02-13 DIAGNOSIS — J3089 Other allergic rhinitis: Secondary | ICD-10-CM | POA: Diagnosis not present

## 2023-02-13 DIAGNOSIS — J301 Allergic rhinitis due to pollen: Secondary | ICD-10-CM | POA: Diagnosis not present

## 2023-02-14 ENCOUNTER — Inpatient Hospital Stay: Admission: RE | Admit: 2023-02-14 | Payer: Medicare Other | Source: Ambulatory Visit

## 2023-02-19 ENCOUNTER — Ambulatory Visit: Admission: RE | Admit: 2023-02-19 | Payer: Medicare Other | Source: Ambulatory Visit

## 2023-02-19 DIAGNOSIS — F17210 Nicotine dependence, cigarettes, uncomplicated: Secondary | ICD-10-CM

## 2023-02-19 DIAGNOSIS — Z122 Encounter for screening for malignant neoplasm of respiratory organs: Secondary | ICD-10-CM

## 2023-02-19 DIAGNOSIS — Z87891 Personal history of nicotine dependence: Secondary | ICD-10-CM

## 2023-02-19 NOTE — Progress Notes (Deleted)
Cardiology Office Note:  .   Date:  02/19/2023  ID:  [name], DOB 02-09-1951, MRN [id number] PCP: Lucky Cowboy, MD  [facility] Providers Cardiologist:  Olga Millers, MD { Click to update primary MD,subspecialty MD or APP then REFRESH:1}   History of Present Illness: .   Ismelda A Pharr is a 72 y.o. female with past medical history of CAD, HTN, HLD with statin intolerance, DM and COPD.    First seen by Dr. Jens Som in 03/29/17 for chest pain, felt to be atypical, exercise treadmill for risk stratification was ordered. Her stress echo revealed hypertensive response but no ischemia on 05/13/17. Her echocardiogram was normal without wall motion abnormalities, normal LV systolic function with EF of 55 to 60%. Following she was started on atorvastatin and zetia, then rosuvastatin, she did not tolerate statin therapy. Was then started on repatha, did not tolerate. She had a CT chest for lung cancer screening that noted aortic atherosclerosis, LAD, left circumflex, and RCA calcifications.    In 11/2019 she was noted to have retinal artery occlusion in her left eye. A repeat echo at that time indicated an EF of 60 to 65% with mild left ventricular hypertrophy, Grade I diastolic dysfunction. She had bilateral carotid dopplers completed that indicated bilateral 1-39% stenosis.    Seen via telehealth visit by Dr. Jens Som on 08/25/20. She denied dyspnea, chest pain, palpitations or syncope.    Presented to ED at [facility] on 6/24 following a witnessed syncopal event by her son in which she was staring blankly and non responsive for several minutes, after coming to was fatigued. Denied precipitating factors. In the ED had returned to her normal. Her EKG indicated NSR with borderline intraventricular conduction delay, labs are unremarkable. Head CT indicated no acute intracranial abnormality.   She was last seen in office on 01/09/23.  She denied any further syncopal events.  Did report  that a month prior she did start taking meloxicam resulting in intermittent palpitations.  She had discontinued use with some improvement in number of palpitations.  She wore a cardiac monitor that showed a minimum heart rate of 45 bpm, max of 171 bpm and average heart rate of 6 6 bpm.  Predominant underlying rhythm was sinus.  She had 62 supraventricular tachycardia runs with the fastest interval lasting 4 beats with a max rate of 171 bpm the longest lasting 19 beats with an average rate of 113 bpm.  He had occasional PACs and rare PVCs.  Syncope: She had a syncopal event on 6/24, reports she was trying to stand from a seated position became lightheaded, sat down, next thing she remembers her son was on the phone with 911.  She denied feelings of palpitations or chest pain prior to or following the event.  Her ED workup was unremarkable. She had a recent TSH by primary care in May that was normal.  She wore a cardiac monitor see results below.  Palpitations: At office visit in 12/2022 she reported intermittent palpitations since starting meloxicam discontinuation she noted some improvement.  She wore a cardiac monitor that showed a minimum heart rate of 45 bpm, max of 171 bpm and average heart rate of 6 6 bpm.  Predominant underlying rhythm was sinus.  She had 62 supraventricular tachycardia runs with the fastest interval lasting 4 beats with a max rate of 171 bpm the longest lasting 19 beats with an average rate of 113 bpm.  She had occasional PACs and rare PVCs. Symptomatic?  Continue  CAD: Noted on CT chest for lung cancer screenings, aortic atherosclerosis, LAD, left circumflex and RCA calcifications. Stable with no anginal symptoms. No indication currently for ischemic evaluation. If develops new symptoms or further presyncope or syncope can consider coronary CTA. Continue bisoprolol-hydrochlorothiazide, Plavix, losartan, nifedipine, Praluent, omega-3 fatty acid.  HTN: Continue  bisoprolol-hydrochlorothiazide, Plavix, losartan, nifedipine, Praluent, omega-3 fatty acid.  Blood pressure today HLD: History of statin, Zetia and repatha intolerance. Last lipid profile on 11/20/22 LDL 91 and cholesterol 188. Recommended that given three vessel CAD (incidental finding from CT) and history of tobacco abuse her LDL goal should be below 70.  At last office visit trialed her on Zetia 10 mg daily, she notified the office of increased leg cramping.  Referral to lipid clinic?  Continue Praluent.  History of retinal artery occlusion: Continue aspirin and plavix (initiated by Dr. Oneta Rack)   Tobacco abuse: Reports current smoking, discussed cessation, she is not interested at this time.   ROS: ****** denies chest pain, shortness of breath, lower extremity edema, fatigue, palpitations, melena, hematuria, hemoptysis, diaphoresis, weakness, presyncope, syncope, orthopnea, and PND.   Studies Reviewed: .       Cardiac Studies & Procedures     STRESS TESTS  ECHOCARDIOGRAM STRESS TEST 05/13/2017  Narrative *[facility]* [address] [place], [place] [address] [phone]  ------------------------------------------------------------------- Stress Echocardiography  Patient:    Twan[place], Simmering MR #:       [id number] Study Date: 05/13/2017 Gender:     F Age:        50 Height:     167.6 cm Weight:     76.8 kg BSA:        1.91 m^2 Pt. Status: Room:  ATTENDING    Olga Millers ORDERING     Arlys John Crenshaw REFERRING    Olga Millers SONOGRAPHER  Dewitt Hoes, RDCS PERFORMING   [facility], Outpatient  cc:  -------------------------------------------------------------------  ------------------------------------------------------------------- Indications:      Abnormal stress (R94.39).  ------------------------------------------------------------------- Study Conclusions  - Stress ECG conclusions: There were no stress arrhythmias or conduction abnormalities. The  stress ECG was negative for ischemia. - Staged echo: There was no echocardiographic evidence for stress-induced ischemia.  Impressions:  - Stress echo with no chest pain; hypertensive BP response; no diagnostic ST changes (increased inferolateral TWI in recovery felt to be nondiagnostic); no obvious stress-induced wall motion abnormalities.  ------------------------------------------------------------------- Study data:   Study status:  Routine.  Consent:  The risks, benefits, and alternatives to the procedure were explained to the patient and informed consent was obtained.  Procedure:  The patient reported no pain pre or post test. Initial setup. The patient was brought to the laboratory. A baseline ECG was recorded. Surface ECG leads and automatic cuff blood pressure measurements were monitored. Treadmill exercise testing was performed using the Bruce protocol. The patient exercised for 4 min 58 sec, to protocol stage 2, to a maximal work rate of 6.3 mets. Exercise was terminated due to achievement of target heart rate and dyspnea. The patient was positioned for image acquisition and recovery monitoring. Transthoracic stress echocardiography. Images were captured at baseline and peak exercise.  Study completion:  The patient tolerated the procedure well. There were no complications. Bruce protocol. Stress echocardiography.  Birthdate:  Patient birthdate: 06-May-1951.  Age:  Patient is 72 yr old.  Sex:  Gender: female.    BMI: 27.3 kg/m^2.  Patient status:  Outpatient.  Study date:  Study date: 05/13/2017. Study time: 03:01 PM.  -------------------------------------------------------------------  -------------------------------------------------------------------  Baseline ECG:  Normal.  ------------------------------------------------------------------- Stress protocol:  +---------------------+---+------------+--------+ !Stage                !HR !BP (mmHg)    !Symptoms! +---------------------+---+------------+--------+ !Baseline             !86 !172/81 (111)!None    ! +---------------------+---+------------+--------+ !Stage 1              !139!166/95 (119)!--------! +---------------------+---+------------+--------+ !Stage 2              !171!------------!--------! +---------------------+---+------------+--------+ !Immediate post stress!148!------------!--------! +---------------------+---+------------+--------+ !Recovery; 1 min      !130!216/80 (125)!--------! +---------------------+---+------------+--------+ !Recovery; 2 min      !107!193/77 (116)!--------! +---------------------+---+------------+--------+ !Recovery; 3 min      !96 !------------!--------! +---------------------+---+------------+--------+ !Recovery; 5 min      !95 !155/68 (97) !--------! +---------------------+---+------------+--------+  ------------------------------------------------------------------- Stress results:   Maximal heart rate during stress was 171 bpm (111% of maximal predicted heart rate). The maximal predicted heart rate was 154 bpm.The target heart rate was achieved. The heart rate response to stress was normal. There was resting hypertension with a hypertensive response to stress. The rate-pressure product for the peak heart rate and blood pressure was [address] mm Hg/min.  The patient experienced no chest pain during stress.  ------------------------------------------------------------------- Stress ECG:  There were no stress arrhythmias or conduction abnormalities.  The stress ECG was negative for ischemia.  ------------------------------------------------------------------- Baseline:  - LV size was normal. - LV global systolic function was normal. The estimated LV ejection fraction was 60%. - Normal wall motion; no LV regional wall motion abnormalities.  Peak stress:  - LV size was normal. - LV global systolic function was at the upper limits  of normal. The estimated LV ejection fraction was 70%. - No evidence for new LV regional wall motion abnormalities.  ------------------------------------------------------------------- Stress echo results:     Left ventricular ejection fraction was normal at rest and with stress. There was no echocardiographic evidence for stress-induced ischemia.  ------------------------------------------------------------------- Prepared and Electronically Authenticated by  Olga Millers 2018-10-29T16:46:35   ECHOCARDIOGRAM  ECHOCARDIOGRAM COMPLETE 12/11/2019  Narrative ECHOCARDIOGRAM REPORT    Patient Name:   [name] Date of Exam: 12/11/2019 Medical Rec #:  [id number]        Height:       66.0 in Accession #:    [id number]       Weight:       168.2 lb Date of Birth:  31-Jul-1950        BSA:          1.858 m Patient Age:    69 years         BP:           130/76 mmHg Patient Gender: F                HR:           69 bpm. Exam Location:  [address]  Procedure: 2D Echo, Cardiac Doppler and Color Doppler  Indications:    H34.9  History:        Patient has prior history of Echocardiogram examinations, most recent 04/03/2017. COPD; Risk Factors:Current Smoker, Hypertension and Dyslipidemia.  Sonographer:    Samule Ohm RDCS Referring Phys: [id number] BRIAN S CRENSHAW  IMPRESSIONS   1. Left ventricular ejection fraction, by estimation, is 60 to 65%. The left ventricle has normal function. The left ventricle has no regional wall motion abnormalities. There is mild left ventricular hypertrophy. Left ventricular diastolic parameters are consistent with  Grade I diastolic dysfunction (impaired relaxation). 2. Right ventricular systolic function is normal. The right ventricular size is normal. 3. The mitral valve is normal in structure. No evidence of mitral valve regurgitation. No evidence of mitral stenosis. 4. The aortic valve is tricuspid. Aortic valve regurgitation is mild. No  aortic stenosis is present. 5. The inferior vena cava is normal in size with greater than 50% respiratory variability, suggesting right atrial pressure of 3 mmHg.  Comparison(s): No significant change from prior study. Prior images reviewed side by side.  FINDINGS Left Ventricle: Left ventricular ejection fraction, by estimation, is 60 to 65%. The left ventricle has normal function. The left ventricle has no regional wall motion abnormalities. The left ventricular internal cavity size was normal in size. There is mild left ventricular hypertrophy. Left ventricular diastolic parameters are consistent with Grade I diastolic dysfunction (impaired relaxation).  Right Ventricle: The right ventricular size is normal. No increase in right ventricular wall thickness. Right ventricular systolic function is normal.  Left Atrium: Left atrial size was normal in size.  Right Atrium: Right atrial size was normal in size.  Pericardium: There is no evidence of pericardial effusion.  Mitral Valve: The mitral valve is normal in structure. Normal mobility of the mitral valve leaflets. No evidence of mitral valve regurgitation. No evidence of mitral valve stenosis.  Tricuspid Valve: The tricuspid valve is normal in structure. Tricuspid valve regurgitation is not demonstrated. No evidence of tricuspid stenosis.  Aortic Valve: The aortic valve is tricuspid. Aortic valve regurgitation is mild. Aortic regurgitation PHT measures 1132 msec. No aortic stenosis is present.  Pulmonic Valve: The pulmonic valve was normal in structure. Pulmonic valve regurgitation is not visualized. No evidence of pulmonic stenosis.  Aorta: The aortic root is normal in size and structure.  Venous: The inferior vena cava is normal in size with greater than 50% respiratory variability, suggesting right atrial pressure of 3 mmHg.  IAS/Shunts: No atrial level shunt detected by color flow Doppler.   LEFT VENTRICLE PLAX 2D LVIDd:          3.90 cm  Diastology LVIDs:         2.50 cm  LV e' lateral:   6.85 cm/s LV PW:         1.40 cm  LV E/e' lateral: 7.9 LV IVS:        1.50 cm  LV e' medial:    4.46 cm/s LVOT diam:     2.30 cm  LV E/e' medial:  12.2 LV SV:         87 LV SV Index:   47 LVOT Area:     4.15 cm   RIGHT VENTRICLE            IVC RV S prime:     9.79 cm/s  IVC diam: 1.10 cm TAPSE (M-mode): 1.9 cm  LEFT ATRIUM             Index       RIGHT ATRIUM           Index LA diam:        3.40 cm 1.83 cm/m  RA Pressure: 3.00 mmHg LA Vol (A2C):   57.2 ml 30.79 ml/m RA Area:     13.80 cm LA Vol (A4C):   42.2 ml 22.71 ml/m RA Volume:   32.90 ml  17.71 ml/m LA Biplane Vol: 51.0 ml 27.45 ml/m AORTIC VALVE LVOT Vmax:   109.00 cm/s LVOT Vmean:  68.100 cm/s LVOT VTI:  0.209 m AI PHT:      1132 msec  AORTA Ao Root diam: 3.70 cm Ao Asc diam:  3.60 cm  MV E velocity: 54.20 cm/s  TRICUSPID VALVE MV A velocity: 84.20 cm/s  Estimated RAP:  3.00 mmHg MV E/A ratio:  0.64 SHUNTS Systemic VTI:  0.21 m Systemic Diam: 2.30 cm  Donato Schultz MD Electronically signed by Donato Schultz MD Signature Date/Time: 12/11/2019/4:26:08 PM    Final    MONITORS  LONG TERM MONITOR (3-14 DAYS) 02/05/2023  Narrative Patch Wear Time:  4 days and 2 hours (2024-07-09T09:52:41-0400 to 2024-07-13T12:14:53-0400)  Patient had a min HR of 45 bpm, max HR of 171 bpm, and avg HR of 66 bpm. Predominant underlying rhythm was Sinus Rhythm. First Degree AV Block was present. 62 Supraventricular Tachycardia runs occurred, the run with the fastest interval lasting 4 beats with a max rate of 171 bpm, the longest lasting 19 beats with an avg rate of 113 bpm. Supraventricular Tachycardia was detected within +/- 45 seconds of symptomatic patient event(s). Isolated SVEs were occasional (1.6%, 6179), SVE Couplets were rare (<1.0%, 334), and SVE Triplets were rare (<1.0%, 228). Isolated VEs were rare (<1.0%), and no VE Couplets or VE Triplets were  present.  Sinus bradycardia, normal sinus rhythm, sinus tachycardia, occasional PAC, short runs of SVT and rare PVC. Olga Millers           *** Risk Assessment/Calculations:   {Does this patient have ATRIAL FIBRILLATION?:[phone]} No BP recorded.  {Refresh Note OR Click here to enter BP  :1}***       Physical Exam:   VS:  There were no vitals taken for this visit.   Wt Readings from Last 3 Encounters:  01/09/23 159 lb 12.8 oz (72.5 kg)  01/07/23 162 lb (73.5 kg)  11/20/22 159 lb 3.2 oz (72.2 kg)    GEN: Well nourished, well developed in no acute distress NECK: No JVD; No carotid bruits CARDIAC: ***RRR, no murmurs, rubs, gallops RESPIRATORY:  Clear to auscultation without rales, wheezing or rhonchi  ABDOMEN: Soft, non-tender, non-distended EXTREMITIES:  No edema; No deformity   ASSESSMENT AND PLAN: .   ***    {Are you ordering a CV Procedure (e.g. stress test, cath, DCCV, TEE, etc)?   Press F2        :[id number]}  Dispo: ***  Signed, Rip Harbour, NP

## 2023-02-20 ENCOUNTER — Ambulatory Visit: Payer: Medicare Other | Attending: Nurse Practitioner | Admitting: Nurse Practitioner

## 2023-02-25 ENCOUNTER — Other Ambulatory Visit: Payer: Self-pay | Admitting: Acute Care

## 2023-02-25 DIAGNOSIS — F17210 Nicotine dependence, cigarettes, uncomplicated: Secondary | ICD-10-CM

## 2023-02-25 DIAGNOSIS — Z122 Encounter for screening for malignant neoplasm of respiratory organs: Secondary | ICD-10-CM

## 2023-02-25 DIAGNOSIS — Z87891 Personal history of nicotine dependence: Secondary | ICD-10-CM

## 2023-02-27 ENCOUNTER — Telehealth: Payer: Self-pay | Admitting: Pharmacist

## 2023-02-27 ENCOUNTER — Ambulatory Visit: Payer: Medicare Other | Admitting: Nurse Practitioner

## 2023-02-27 DIAGNOSIS — E782 Mixed hyperlipidemia: Secondary | ICD-10-CM

## 2023-02-27 DIAGNOSIS — I2510 Atherosclerotic heart disease of native coronary artery without angina pectoris: Secondary | ICD-10-CM

## 2023-02-27 MED ORDER — PRALUENT 150 MG/ML ~~LOC~~ SOAJ
150.0000 mg | SUBCUTANEOUS | 1 refills | Status: DC
Start: 2023-02-27 — End: 2023-07-24

## 2023-02-27 NOTE — Telephone Encounter (Signed)
Received refill request on [facility] for Praluent

## 2023-03-01 DIAGNOSIS — J301 Allergic rhinitis due to pollen: Secondary | ICD-10-CM | POA: Diagnosis not present

## 2023-03-01 DIAGNOSIS — J3089 Other allergic rhinitis: Secondary | ICD-10-CM | POA: Diagnosis not present

## 2023-03-12 ENCOUNTER — Other Ambulatory Visit: Payer: Self-pay

## 2023-03-12 MED ORDER — LOSARTAN POTASSIUM 100 MG PO TABS: ORAL_TABLET | ORAL | 3 refills | Status: AC

## 2023-03-19 ENCOUNTER — Other Ambulatory Visit: Payer: Self-pay | Admitting: Nurse Practitioner

## 2023-03-19 DIAGNOSIS — R0609 Other forms of dyspnea: Secondary | ICD-10-CM

## 2023-03-19 DIAGNOSIS — J432 Centrilobular emphysema: Secondary | ICD-10-CM

## 2023-03-20 DIAGNOSIS — J301 Allergic rhinitis due to pollen: Secondary | ICD-10-CM | POA: Diagnosis not present

## 2023-03-20 DIAGNOSIS — J3089 Other allergic rhinitis: Secondary | ICD-10-CM | POA: Diagnosis not present

## 2023-03-25 DIAGNOSIS — J3081 Allergic rhinitis due to animal (cat) (dog) hair and dander: Secondary | ICD-10-CM | POA: Diagnosis not present

## 2023-03-25 DIAGNOSIS — J301 Allergic rhinitis due to pollen: Secondary | ICD-10-CM | POA: Diagnosis not present

## 2023-03-25 DIAGNOSIS — R052 Subacute cough: Secondary | ICD-10-CM | POA: Diagnosis not present

## 2023-03-25 DIAGNOSIS — J3089 Other allergic rhinitis: Secondary | ICD-10-CM | POA: Diagnosis not present

## 2023-03-25 DIAGNOSIS — J439 Emphysema, unspecified: Secondary | ICD-10-CM | POA: Diagnosis not present

## 2023-04-09 DIAGNOSIS — J3081 Allergic rhinitis due to animal (cat) (dog) hair and dander: Secondary | ICD-10-CM | POA: Diagnosis not present

## 2023-04-09 DIAGNOSIS — J3089 Other allergic rhinitis: Secondary | ICD-10-CM | POA: Diagnosis not present

## 2023-04-09 DIAGNOSIS — J301 Allergic rhinitis due to pollen: Secondary | ICD-10-CM | POA: Diagnosis not present

## 2023-04-25 ENCOUNTER — Ambulatory Visit: Payer: Medicare Other | Admitting: Nurse Practitioner

## 2023-04-25 ENCOUNTER — Encounter: Payer: Self-pay | Admitting: Nurse Practitioner

## 2023-04-25 ENCOUNTER — Other Ambulatory Visit: Payer: Self-pay

## 2023-04-25 VITALS — BP 146/72 | HR 96 | Temp 97.9°F | Wt 161.0 lb

## 2023-04-25 DIAGNOSIS — J069 Acute upper respiratory infection, unspecified: Secondary | ICD-10-CM

## 2023-04-25 DIAGNOSIS — R058 Other specified cough: Secondary | ICD-10-CM

## 2023-04-25 DIAGNOSIS — F419 Anxiety disorder, unspecified: Secondary | ICD-10-CM

## 2023-04-25 DIAGNOSIS — F17200 Nicotine dependence, unspecified, uncomplicated: Secondary | ICD-10-CM

## 2023-04-25 DIAGNOSIS — R0989 Other specified symptoms and signs involving the circulatory and respiratory systems: Secondary | ICD-10-CM | POA: Diagnosis not present

## 2023-04-25 DIAGNOSIS — R0609 Other forms of dyspnea: Secondary | ICD-10-CM

## 2023-04-25 DIAGNOSIS — J432 Centrilobular emphysema: Secondary | ICD-10-CM

## 2023-04-25 DIAGNOSIS — J449 Chronic obstructive pulmonary disease, unspecified: Secondary | ICD-10-CM

## 2023-04-25 DIAGNOSIS — F330 Major depressive disorder, recurrent, mild: Secondary | ICD-10-CM

## 2023-04-25 MED ORDER — PROMETHAZINE-DM 6.25-15 MG/5ML PO SYRP
5.0000 mL | ORAL_SOLUTION | Freq: Four times a day (QID) | ORAL | 0 refills | Status: AC | PRN
Start: 2023-04-25 — End: ?

## 2023-04-25 MED ORDER — AZITHROMYCIN 250 MG PO TABS
ORAL_TABLET | ORAL | 1 refills | Status: DC
Start: 2023-04-25 — End: 2023-06-06

## 2023-04-25 MED ORDER — IPRATROPIUM-ALBUTEROL 0.5-2.5 (3) MG/3ML IN SOLN
3.0000 mL | RESPIRATORY_TRACT | 2 refills | Status: AC | PRN
Start: 2023-04-25 — End: ?

## 2023-04-25 MED ORDER — BUPROPION HCL ER (XL) 150 MG PO TB24
ORAL_TABLET | ORAL | 3 refills | Status: AC
Start: 2023-04-25 — End: ?

## 2023-04-25 MED ORDER — PREDNISONE 10 MG PO TABS
ORAL_TABLET | ORAL | 0 refills | Status: DC
Start: 2023-04-25 — End: 2023-06-06

## 2023-04-25 NOTE — Patient Instructions (Addendum)
Managing the Challenge of Quitting Smoking Quitting smoking is a physical and mental challenge. You may have cravings, withdrawal symptoms, and temptation to smoke. Before quitting, work with your health care provider to make a plan that can help you manage quitting. Making a plan before you quit may keep you from smoking when you have the urge to smoke while trying to quit. How to manage lifestyle changes Managing stress Stress can make you want to smoke, and wanting to smoke may cause stress. It is important to find ways to manage your stress. You could try some of the following: Practice relaxation techniques. Breathe slowly and deeply, in through your nose and out through your mouth. Listen to music. Soak in a bath or take a shower. Imagine a peaceful place or vacation. Get some support. Talk with family or friends about your stress. Join a support group. Talk with a counselor or therapist. Get some physical activity. Go for a walk, run, or bike ride. Play a favorite sport. Practice yoga.  Medicines Talk with your health care provider about medicines that might help you deal with cravings and make quitting easier for you. Relationships Social situations can be difficult when you are quitting smoking. To manage this, you can: Avoid parties and other social situations where people might be smoking. Avoid alcohol. Leave right away if you have the urge to smoke. Explain to your family and friends that you are quitting smoking. Ask for support and let them know you might be a bit grumpy. Plan activities where smoking is not an option. General instructions Be aware that many people gain weight after they quit smoking. However, not everyone does. To keep from gaining weight, have a plan in place before you quit, and stick to the plan after you quit. Your plan should include: Eating healthy snacks. When you have a craving, it may help to: Eat popcorn, or try carrots, celery, or other cut  vegetables. Chew sugar-free gum. Changing how you eat. Eat small portion sizes at meals. Eat 4-6 small meals throughout the day instead of 1-2 large meals a day. Be mindful when you eat. You should avoid watching television or doing other things that might distract you as you eat. Exercising regularly. Make time to exercise each day. If you do not have time for a long workout, do short bouts of exercise for 5-10 minutes several times a day. Do some form of strengthening exercise, such as weight lifting. Do some exercise that gets your heart beating and causes you to breathe deeply, such as walking fast, running, swimming, or biking. This is very important. Drinking plenty of water or other low-calorie or no-calorie drinks. Drink enough fluid to keep your urine pale yellow.  How to recognize withdrawal symptoms Your body and mind may experience discomfort as you try to get used to not having nicotine in your system. These effects are called withdrawal symptoms. They may include: Feeling hungrier than normal. Having trouble concentrating. Feeling irritable or restless. Having trouble sleeping. Feeling depressed. Craving a cigarette. These symptoms may surprise you, but they are normal to have when quitting smoking. To manage withdrawal symptoms: Avoid places, people, and activities that trigger your cravings. Remember why you want to quit. Get plenty of sleep. Avoid coffee and other drinks that contain caffeine. These may worsen some of your symptoms. How to manage cravings Come up with a plan for how to deal with your cravings. The plan should include the following: A definition of the specific situation  you want to deal with. An activity or action you will take to replace smoking. A clear idea for how this action will help. The name of someone who could help you with this. Cravings usually last for 5-10 minutes. Consider taking the following actions to help you with your plan to deal  with cravings: Keep your mouth busy. Chew sugar-free gum. Suck on hard candies or a straw. Brush your teeth. Keep your hands and body busy. Change to a different activity right away. Squeeze or play with a ball. Do an activity or a hobby, such as making bead jewelry, practicing needlepoint, or working with wood. Mix up your normal routine. Take a short exercise break. Go for a quick walk, or run up and down stairs. Focus on doing something kind or helpful for someone else. Call a friend or family member to talk during a craving. Join a support group. Contact a quitline. Where to find support To get help or find a support group: Call the National Cancer Institute's Smoking Quitline: 1-800-QUIT-NOW 6067849862) Text QUIT to SmokefreeTXT: 841324 Where to find more information Visit these websites to find more information on quitting smoking: U.S. Department of Health and Human Services: www.smokefree.gov American Lung Association: www.freedomfromsmoking.org Centers for Disease Control and Prevention (CDC): FootballExhibition.com.br American Heart Association: www.heart.org Contact a health care provider if: You want to change your plan for quitting. The medicines you are taking are not helping. Your eating feels out of control or you cannot sleep. You feel depressed or become very anxious. Summary Quitting smoking is a physical and mental challenge. You will face cravings, withdrawal symptoms, and temptation to smoke again. Preparation can help you as you go through these challenges. Try different techniques to manage stress, handle social situations, and prevent weight gain. You can deal with cravings by keeping your mouth busy (such as by chewing gum), keeping your hands and body busy, calling family or friends, or contacting a quitline for people who want to quit smoking. You can deal with withdrawal symptoms by avoiding places where people smoke, getting plenty of rest, and avoiding drinks that  contain caffeine. This information is not intended to replace advice given to you by your health care provider. Make sure you discuss any questions you have with your health care provider. Document Revised: 06/23/2021 Document Reviewed: 06/23/2021 Elsevier Patient Education  2024 Elsevier Inc.   Upper Respiratory Infection, Adult An upper respiratory infection (URI) affects the nose, throat, and upper airways that lead to the lungs. The most common type of URI is often called the common cold. URIs usually get better on their own, without medical treatment. What are the causes? A URI is caused by a germ (virus). You may catch these germs by: Breathing in droplets from an infected person's cough or sneeze. Touching something that has the germ on it (is contaminated) and then touching your mouth, nose, or eyes. What increases the risk? You are more likely to get a URI if: You are very young or very old. You have close contact with others, such as at work, school, or a health care facility. You smoke. You have long-term (chronic) heart or lung disease. You have a weakened disease-fighting system (immune system). You have nasal allergies or asthma. You have a lot of stress. You have poor nutrition. What are the signs or symptoms? Runny or stuffy (congested) nose. Cough. Sneezing. Sore throat. Headache. Feeling tired (fatigue). Fever. Not wanting to eat as much as usual. Pain in your forehead, behind  your eyes, and over your cheekbones (sinus pain). Muscle aches. Redness or irritation of the eyes. Pressure in the ears or face. How is this treated? URIs usually get better on their own within 7-10 days. Medicines cannot cure URIs, but your doctor may recommend certain medicines to help relieve symptoms, such as: Over-the-counter cold medicines. Medicines to reduce coughing (cough suppressants). Coughing is a type of defense against infection that helps to clear the nose, throat,  windpipe, and lungs (respiratory system). Take these medicines only as told by your doctor. Medicines to lower your fever. Follow these instructions at home: Activity Rest as needed. If you have a fever, stay home from work or school until your fever is gone, or until your doctor says you may return to work or school. You should stay home until you cannot spread the infection anymore (you are not contagious). Your doctor may have you wear a face mask so you have less risk of spreading the infection. Relieving symptoms Rinse your mouth often with salt water. To make salt water, dissolve -1 tsp (3-6 g) of salt in 1 cup (237 mL) of warm water. Use a cool-mist humidifier to add moisture to the air. This can help you breathe more easily. Eating and drinking  Drink enough fluid to keep your pee (urine) pale yellow. Eat soups and other clear broths. General instructions  Take over-the-counter and prescription medicines only as told by your doctor. Do not smoke or use any products that contain nicotine or tobacco. If you need help quitting, ask your doctor. Avoid being where people are smoking (avoid secondhand smoke). Stay up to date on all your shots (immunizations), and get the flu shot every year. Keep all follow-up visits. How to prevent the spread of infection to others  Wash your hands with soap and water for at least 20 seconds. If you cannot use soap and water, use hand sanitizer. Avoid touching your mouth, face, eyes, or nose. Cough or sneeze into a tissue or your sleeve or elbow. Do not cough or sneeze into your hand or into the air. Contact a doctor if: You are getting worse, not better. You have any of these: A fever or chills. Brown or red mucus in your nose. Yellow or brown fluid (discharge)coming from your nose. Pain in your face, especially when you bend forward. Swollen neck glands. Pain when you swallow. White areas in the back of your throat. Get help right away  if: You have shortness of breath that gets worse. You have very bad or constant: Headache. Ear pain. Pain in your forehead, behind your eyes, and over your cheekbones (sinus pain). Chest pain. You have long-lasting (chronic) lung disease along with any of these: Making high-pitched whistling sounds when you breathe, most often when you breathe out (wheezing). Long-lasting cough (more than 14 days). Coughing up blood. A change in your usual mucus. You have a stiff neck. You have changes in your: Vision. Hearing. Thinking. Mood. These symptoms may be an emergency. Get help right away. Call 911. Do not wait to see if the symptoms will go away. Do not drive yourself to the hospital. Summary An upper respiratory infection (URI) is caused by a germ (virus). The most common type of URI is often called the common cold. URIs usually get better within 7-10 days. Take over-the-counter and prescription medicines only as told by your doctor. This information is not intended to replace advice given to you by your health care provider. Make sure you discuss  any questions you have with your health care provider. Document Revised: 02/01/2021 Document Reviewed: 02/01/2021 Elsevier Patient Education  2024 [employer].

## 2023-04-25 NOTE — Progress Notes (Signed)
Assessment and Plan:  [name] was seen today for an episodic visit.  Diagnoses and all order for this visit:  Upper respiratory tract infection, unspecified type Start tm with Zpak given length of symptoms  - azithromycin (ZITHROMAX) 250 MG tablet; Take 2 tablets on  Day 1,  followed by 1 tablet  daily for 4 more days    for Sinusitis  /Bronchitis  Dispense: 6 each; Refill: 1  Centrilobular emphysema (HCC)/Smoker Continue Stiolto and Albuterol as directed Start steroid taper as directed Discussed smoking cessation - patient trying to cut down. Smoking cessation instruction/counseling given:  counseled patient on the dangers of tobacco use, advised patient to stop smoking, and reviewed strategies to maximize success Wellbutrin refilled    Productive cough Duoneb administered - patient tolerated well. Start steroid taper as directed. Start Promethazine cough syrup  Stay well hydrated to keep mucus thin and productive  - ipratropium-albuterol (DUONEB) 0.5-2.5 (3) MG/3ML SOLN; Take 3 mLs by nebulization every 4 (four) hours as needed.  Dispense: 90 mL; Refill: 2 - predniSONE (DELTASONE) 10 MG tablet; 1 tab 3 x day for 2 days, then 1 tab 2 x day for 2 days, then 1 tab 1 x day for 3 days  Dispense: 13 tablet; Refill: 0 - promethazine-dextromethorphan (PROMETHAZINE-DM) 6.25-15 MG/5ML syrup; Take 5 mLs by mouth 4 (four) times daily as needed for cough.  Dispense: 240 mL; Refill: 0  Rhonchi at right lung base Duoneb administered - patient tolerated well. Start steroid taper as directed. Stay well hydrated to keep mucus thin and productive  - ipratropium-albuterol (DUONEB) 0.5-2.5 (3) MG/3ML SOLN; Take 3 mLs by nebulization every 4 (four) hours as needed.  Dispense: 90 mL; Refill: 2 - predniSONE (DELTASONE) 10 MG tablet; 1 tab 3 x day for 2 days, then 1 tab 2 x day for 2 days, then 1 tab 1 x day for 3 days  Dispense: 13 tablet; Refill: 0  DOE (dyspnea on exertion) Report to ER or  call 911 for any increase in difficulty breathing.  - ipratropium-albuterol (DUONEB) 0.5-2.5 (3) MG/3ML SOLN; Take 3 mLs by nebulization every 4 (four) hours as needed.  Dispense: 90 mL; Refill: 2 - predniSONE (DELTASONE) 10 MG tablet; 1 tab 3 x day for 2 days, then 1 tab 2 x day for 2 days, then 1 tab 1 x day for 3 days  Dispense: 13 tablet; Refill: 0  Recurrent mild major depressive disorder with anxiety (HCC) Restart Wellbutrin as directed.  - buPROPion (WELLBUTRIN XL) 150 MG 24 hr tablet; Take  1 tablet  2 x /day  for Mood, Focus & Concentration                             /                                                                     TAKE                                  BY  MOUTH  Dispense: 180 tablet; Refill: 3   Notify office for further evaluation and treatment, questions or concerns if s/s fail to improve. The risks and benefits of my recommendations, as well as other treatment options were discussed with the patient today. Questions were answered.  Further disposition pending results of labs. Discussed med's effects and SE's.    Over 30 minutes of exam, counseling, chart review, and critical decision making was performed.   Future Appointments  Date Time Provider Department Center  06/06/2023 10:00 AM Adela Glimpse, NP [facility] None  11/13/2023 11:00 AM Betsaida [place], Archie Patten, NP [facility] None    ------------------------------------------------------------------------------------------------------------------   HPI BP (!) 146/72   Pulse 96   Temp 97.9 F (36.6 C)   Wt 161 lb (73 kg)   SpO2 96%   BMI 26.79 kg/m    Patient complains of symptoms of a URI. Symptoms include congestion, cough described as productive, nasal congestion, shortness of breath, sinus pressure, and wheezing. Onset of symptoms was 3 weeks ago, and has been unchanged since that time. Treatment to date: cough suppressants and decongestants.  Reports her  niece was visiting recently and was recently diagnosed with PNA.  Had updated CT chest lung cancer screening 02/19/23 that revealed a Lung-RADS 2S, benign appearance or behavior. She is to continue annual screening with low-dose chest CT without contrast yearly.  She is a current every day smoker.  She was having success with quitting with Wellbutrin but stopped taking.  She is asking for this medication to be refilled.    Past Medical History:  Diagnosis Date   Allergy    enviromental   Anxiety    Arthritis    osteoarthritis   COPD (chronic obstructive pulmonary disease) (HCC)    mild   Diabetes mellitus without complication (HCC)    controlled by diet   Environmental allergies    GERD (gastroesophageal reflux disease)    Hepatitis    hepatitis A   History of colon polyps    Hyperlipidemia    Hypertension    IBS (irritable bowel syndrome)    Mixed hyperlipidemia 06/15/2013   PONV (postoperative nausea and vomiting)    second surgery no problems last surgery   Solitary pulmonary nodule 06/02/2019   Urgency of urination    Takes oxybutynin; under control at this time     Allergies  Allergen Reactions   Enalapril Swelling    Caused lips to swell and bloating.   Statins     Tingling with several statins   Repatha [Evolocumab]     Stated she didn't tolerate it and switched to Praluent    Current Outpatient Medications on File Prior to Visit  Medication Sig   albuterol (VENTOLIN HFA) 108 (90 Base) MCG/ACT inhaler Inhale 2 puffs into the lungs every 6 (six) hours as needed for wheezing or shortness of breath.   Alirocumab (PRALUENT) 150 MG/ML SOAJ Inject 1 mL (150 mg total) into the skin every 14 (fourteen) days.   Ascorbic Acid (VITAMIN C) 1000 MG tablet Take 1,000 mg by mouth daily.   aspirin 81 MG tablet Take 81 mg by mouth every other day.    azelastine (ASTELIN) 0.1 % nasal spray Place 2 sprays into both nostrils daily.   bisoprolol-hydrochlorothiazide (ZIAC) 10-6.25 MG  tablet TAKE 1/2 TABLET BY MOUTH DAILY   cetirizine (ZYRTEC) 10 MG tablet Take 10 mg by mouth daily.   Cholecalciferol (VITAMIN D3) 5000 UNITS TABS Take 5,000 Units by mouth daily.   clonazePAM (KLONOPIN) 0.5  MG tablet TAKE 1/2 TO 1 TABLET BY MOUTH ONCE  DAILY ONLY IF NEEDED FOR ANXIETY ATTACK AND LIMIT TO 5 DAYS PER WEEK TO AVOID ADDICTION AND DEMENTIA   clopidogrel (PLAVIX) 75 MG tablet Take  1 tablet  Daily  to Prevent Blood Clots   Cyanocobalamin (VITAMIN B12 PO) Take 1,000 mcg by mouth daily.   EPINEPHrine 0.3 mg/0.3 mL IJ SOAJ injection Inject 0.3 mg into the muscle as needed.   fluticasone (FLONASE) 50 MCG/ACT nasal spray 1 Spray each nostril daily   hydrocortisone (ANUSOL-HC) 2.5 % rectal cream Place 1 Application rectally 2 (two) times daily. Apply to external hemorrhoids 3 to 4  x / day   losartan (COZAAR) 100 MG tablet Take  1 tablet Daily for BP                                          /                      TAKE                          BY                                MOUTH   methylcellulose oral powder Take by mouth daily.    montelukast (SINGULAIR) 10 MG tablet Take 1 tablet (10 mg total) by mouth at bedtime.   Multiple Vitamins-Minerals (MULTI COMPLETE PO) Take 1 capsule by mouth daily.   NIFEdipine (PROCARDIA XL/NIFEDICAL XL) 60 MG 24 hr tablet Take 1 tablet every night for BP   Omega-3 Fatty Acids (FISH OIL) 1000 MG CAPS Take 1 capsule (1,000 mg total) by mouth 2 (two) times daily with a meal. (Patient taking differently: Take 1 capsule by mouth daily.)   omeprazole (PRILOSEC) 40 MG capsule Take  1 capsule  2 x /day to Prevent Heartburn & Indigestion                          /                                              TAKE                                             BY                                     MOUTH   Tiotropium Bromide-Olodaterol (STIOLTO RESPIMAT) 2.5-2.5 MCG/ACT AERS INHALE 2 PUFFS INTO THE LUNGS DAILY   Zinc 50 MG TABS Take 25 mg by mouth daily.   ezetimibe  (ZETIA) 10 MG tablet Take 1 tablet (10 mg total) by mouth daily. (Patient not taking: Reported on 01/29/2023)   meloxicam (MOBIC) 7.5 MG tablet Take  1 tablet   Daily   with Food for Pain & Inflammation                                                 /  TAKE                                                   BY                                                        MOUTH (Patient not taking: Reported on 01/09/2023)   No current facility-administered medications on file prior to visit.    ROS: all negative except what is noted in the HPI.   Physical Exam:  BP (!) 146/72   Pulse 96   Temp 97.9 F (36.6 C)   Wt 161 lb (73 kg)   SpO2 96%   BMI 26.79 kg/m   General Appearance: NAD.  Awake, conversant and cooperative. Eyes: PERRLA, EOMs intact.  Sclera white.  Conjunctiva without erythema. Sinuses: No frontal/maxillary tenderness.  No nasal discharge. Nares patent.  ENT/Mouth: Ext aud canals clear.  Bilateral TMs w/DOL and without erythema or bulging. Hearing intact.  Posterior pharynx without swelling or exudate.  Tonsils without swelling or erythema.  Neck: Supple.  No masses, nodules or thyromegaly. Respiratory: Effort is regular with non-labored breathing. Breath sounds are equal bilaterally with  rhonchi to right lower base posterior lung.   Cardio: RRR with no MRGs. Brisk peripheral pulses without edema.  Abdomen: Active BS in all four quadrants.  Soft and non-tender without guarding, rebound tenderness, hernias or masses. Lymphatics: Non tender without lymphadenopathy.  Musculoskeletal: Full ROM, 5/5 strength, normal ambulation.  No clubbing or cyanosis. Skin: Appropriate color for ethnicity. Warm without rashes, lesions, ecchymosis, ulcers.  Neuro: CN II-XII grossly normal. Normal muscle tone without cerebellar symptoms and intact sensation.   Psych: AO X 3,  appropriate mood and affect, insight and judgment.     Adela Glimpse,  NP 3:30 PM [facility]

## 2023-04-26 ENCOUNTER — Other Ambulatory Visit: Payer: Self-pay | Admitting: Internal Medicine

## 2023-04-26 DIAGNOSIS — R058 Other specified cough: Secondary | ICD-10-CM

## 2023-04-26 DIAGNOSIS — R0989 Other specified symptoms and signs involving the circulatory and respiratory systems: Secondary | ICD-10-CM

## 2023-04-26 DIAGNOSIS — J069 Acute upper respiratory infection, unspecified: Secondary | ICD-10-CM

## 2023-04-26 DIAGNOSIS — J432 Centrilobular emphysema: Secondary | ICD-10-CM

## 2023-04-30 ENCOUNTER — Ambulatory Visit
Admission: RE | Admit: 2023-04-30 | Discharge: 2023-04-30 | Disposition: A | Discharge status: Home / Self Care | Payer: Medicare Other | Source: Ambulatory Visit | Attending: Internal Medicine | Admitting: Internal Medicine

## 2023-04-30 DIAGNOSIS — R0989 Other specified symptoms and signs involving the circulatory and respiratory systems: Secondary | ICD-10-CM

## 2023-04-30 DIAGNOSIS — R058 Other specified cough: Secondary | ICD-10-CM | POA: Diagnosis not present

## 2023-04-30 DIAGNOSIS — J432 Centrilobular emphysema: Secondary | ICD-10-CM

## 2023-05-13 ENCOUNTER — Encounter: Payer: Medicare Other | Admitting: Internal Medicine

## 2023-05-16 DIAGNOSIS — J3081 Allergic rhinitis due to animal (cat) (dog) hair and dander: Secondary | ICD-10-CM | POA: Diagnosis not present

## 2023-05-16 DIAGNOSIS — J3089 Other allergic rhinitis: Secondary | ICD-10-CM | POA: Diagnosis not present

## 2023-05-16 DIAGNOSIS — J301 Allergic rhinitis due to pollen: Secondary | ICD-10-CM | POA: Diagnosis not present

## 2023-05-21 ENCOUNTER — Other Ambulatory Visit: Payer: Self-pay | Admitting: Internal Medicine

## 2023-05-21 DIAGNOSIS — I10 Essential (primary) hypertension: Secondary | ICD-10-CM

## 2023-05-21 MED ORDER — NIFEDIPINE ER OSMOTIC RELEASE 60 MG PO TB24
ORAL_TABLET | ORAL | 3 refills | Status: AC
Start: 2023-05-21 — End: ?

## 2023-05-23 DIAGNOSIS — J301 Allergic rhinitis due to pollen: Secondary | ICD-10-CM | POA: Diagnosis not present

## 2023-05-23 DIAGNOSIS — J3089 Other allergic rhinitis: Secondary | ICD-10-CM | POA: Diagnosis not present

## 2023-05-30 ENCOUNTER — Telehealth: Payer: Self-pay | Admitting: Cardiology

## 2023-05-30 DIAGNOSIS — J3089 Other allergic rhinitis: Secondary | ICD-10-CM | POA: Diagnosis not present

## 2023-05-30 DIAGNOSIS — J301 Allergic rhinitis due to pollen: Secondary | ICD-10-CM | POA: Diagnosis not present

## 2023-05-30 MED ORDER — BISOPROLOL-HYDROCHLOROTHIAZIDE 10-6.25 MG PO TABS: 0.5000 | ORAL_TABLET | Freq: Every day | ORAL | 2 refills | Status: DC

## 2023-05-30 NOTE — Telephone Encounter (Signed)
*  STAT* If patient is at the pharmacy, call can be transferred to refill team.   1. Which medications need to be refilled? (please list name of each medication and dose if known)   bisoprolol-hydrochlorothiazide (ZIAC) 10-6.25 MG tablet   2. Would you like to learn more about the convenience, safety, & potential cost savings by using the [facility]?   3. Are you open to using the [facility] (Type [facility]. ).  4. Which pharmacy/location (including street and city if local pharmacy) is medication to be sent to?  [id number] - [place], [place] - [address] AT [facility] OF [address]   5. Do they need a 30 day or 90 day supply?   90 day  Patient stated she is completely out of this medication.

## 2023-05-30 NOTE — Telephone Encounter (Signed)
Pt's medication was sent to pt's pharmacy as requested. Confirmation received.  °

## 2023-06-05 ENCOUNTER — Other Ambulatory Visit: Payer: Self-pay | Admitting: Emergency Medicine

## 2023-06-06 ENCOUNTER — Encounter: Payer: Self-pay | Admitting: Nurse Practitioner

## 2023-06-06 ENCOUNTER — Ambulatory Visit (INDEPENDENT_AMBULATORY_CARE_PROVIDER_SITE_OTHER): Payer: Medicare Other | Admitting: Nurse Practitioner

## 2023-06-06 VITALS — BP 160/82 | HR 71 | Temp 97.6°F | Ht 65.0 in | Wt 162.6 lb

## 2023-06-06 DIAGNOSIS — E559 Vitamin D deficiency, unspecified: Secondary | ICD-10-CM

## 2023-06-06 DIAGNOSIS — Z1389 Encounter for screening for other disorder: Secondary | ICD-10-CM

## 2023-06-06 DIAGNOSIS — Z79899 Other long term (current) drug therapy: Secondary | ICD-10-CM | POA: Diagnosis not present

## 2023-06-06 DIAGNOSIS — Z23 Encounter for immunization: Secondary | ICD-10-CM

## 2023-06-06 DIAGNOSIS — F330 Major depressive disorder, recurrent, mild: Secondary | ICD-10-CM

## 2023-06-06 DIAGNOSIS — Z136 Encounter for screening for cardiovascular disorders: Secondary | ICD-10-CM

## 2023-06-06 DIAGNOSIS — I10 Essential (primary) hypertension: Secondary | ICD-10-CM | POA: Diagnosis not present

## 2023-06-06 DIAGNOSIS — I700 Atherosclerosis of aorta: Secondary | ICD-10-CM

## 2023-06-06 DIAGNOSIS — G4733 Obstructive sleep apnea (adult) (pediatric): Secondary | ICD-10-CM

## 2023-06-06 DIAGNOSIS — Z0001 Encounter for general adult medical examination with abnormal findings: Secondary | ICD-10-CM

## 2023-06-06 DIAGNOSIS — Z0000 Encounter for general adult medical examination without abnormal findings: Secondary | ICD-10-CM

## 2023-06-06 DIAGNOSIS — E782 Mixed hyperlipidemia: Secondary | ICD-10-CM

## 2023-06-06 DIAGNOSIS — Z131 Encounter for screening for diabetes mellitus: Secondary | ICD-10-CM | POA: Diagnosis not present

## 2023-06-06 DIAGNOSIS — Z87891 Personal history of nicotine dependence: Secondary | ICD-10-CM

## 2023-06-06 DIAGNOSIS — Z130 Encounter for screening for diseases of the blood and blood-forming organs and certain disorders involving the immune mechanism: Secondary | ICD-10-CM

## 2023-06-06 DIAGNOSIS — K219 Gastro-esophageal reflux disease without esophagitis: Secondary | ICD-10-CM

## 2023-06-06 DIAGNOSIS — R918 Other nonspecific abnormal finding of lung field: Secondary | ICD-10-CM

## 2023-06-06 DIAGNOSIS — J432 Centrilobular emphysema: Secondary | ICD-10-CM

## 2023-06-06 NOTE — Progress Notes (Signed)
CPE  Assessment:   Diagnoses and all orders for this visit:  CPE Due annually  Health maintenance reviewed Healthily lifestyle goals set  Essential hypertension Elevated in clinic otherwise controlled  Continue medications;  Discussed DASH (Dietary Approaches to Stop Hypertension) DASH diet is lower in sodium than a typical American diet. Cut back on foods that are high in saturated fat, cholesterol, and trans fats. Eat more whole-grain foods, fish, poultry, and nuts Remain active and exercise as tolerated daily.  Monitor BP at home-Call if greater than 130/80.   Aortic atherosclerosis by Abd CT scan in 2018  [facility]) Per CT 05/2019 Discussed lifestyle modifications. Recommended diet heavy in fruits and veggies, omega 3's. Decrease consumption of animal meats, cheeses, and dairy products. Remain active and exercise as tolerated. Continue to monitor.  Centrilobular emphysema (HCC) Continues to smoke - ready to quit -continue Wellbutrin. Pulmonology following LDCT lung cancer screening, Lung RADS 2S benign  Continue annual screenings Smoking cessation instruction/counseling given:  counseled patient on the dangers of tobacco use, advised patient to stop smoking, and reviewed strategies to maximize success  OSA (obstructive sleep apnea) Does not use CPAP - unable to tolerate. Increase risk for untreated OSA discussed. Continue to follow up with Pulmonology Continue to monitor  Lung nodules Continue annual low dose chest CT lung cancer screening contrast in 3 months (July 2023) is recommended. Pulmonology following   Gastroesophageal reflux disease, unspecified whether esophagitis present Controlled  No suspected reflux complications (Barret/stricture). Lifestyle modification:  wt loss, avoid meals 2-3h before bedtime. Consider eliminating food triggers:  chocolate, caffeine, EtOH, acid/spicy food.  Hyperlipidemia, mixed Controlled Continue medications;  Discussed  lifestyle modifications. Recommended diet heavy in fruits and veggies, omega 3's. Decrease consumption of animal meats, cheeses, and dairy products. Remain active and exercise as tolerated. Continue to monitor.  Vitamin D deficiency Controlled. Continue supplement  Recurrent mild major depressive disorder with anxiety (HCC) Controlled. Continue Wellbutrin, Klonopin PRN, rare use of Benzo. Continue to monitor  Medication management All medications discussed and reviewed in full. All questions and concerns regarding medications addressed.    Flu vaccine need Administered without complication  Orders Placed This Encounter  Procedures   Flu vaccine HIGH DOSE PF(Fluzone Trivalent)   CBC with Differential/Platelet   COMPLETE METABOLIC PANEL WITH GFR   Magnesium   Lipid panel   TSH   Hemoglobin A1c   Insulin, random   VITAMIN D 25 Hydroxy (Vit-D Deficiency, Fractures)   Urinalysis, Routine w reflex microscopic   Microalbumin / creatinine urine ratio   Vitamin B12   EKG 12-Lead   Notify office for further evaluation and treatment, questions or concerns if any reported s/s fail to improve.   The patient was advised to call back or seek an in-person evaluation if any symptoms worsen or if the condition fails to improve as anticipated.   Further disposition pending results of labs. Discussed med's effects and SE's.    I discussed the assessment and treatment plan with the patient. The patient was provided an opportunity to ask questions and all were answered. The patient agreed with the plan and demonstrated an understanding of the instructions.  Discussed med's effects and SE's. Screening labs and tests as requested with regular follow-up as recommended.  I provided 30 minutes of face-to-face time during this encounter including counseling, chart review, and critical decision making was preformed.  Future Appointments  Date Time Provider Department Center  11/13/2023 11:00 AM  Sherri Glimpse, NP [facility] None  06/19/2024 10:00  AM Sherri Cowboy, MD [facility] None     Plan:   During the course of the visit the patient was educated and counseled about appropriate screening and preventive services including:   Pneumococcal vaccine  Prevnar 13 Influenza vaccine Td vaccine Screening electrocardiogram Bone densitometry screening Colorectal cancer screening Diabetes screening Glaucoma screening Nutrition counseling  Advanced directives: requested   Subjective:  [name] is a 72 y.o. female who presents for Medicare Annual Wellness Visit and 3 month follow up.   She is married and lives with her husband  She had a updated Lung CT scan 02/2023 which revealed no new lung nodules. Continues to smoke but ready to quit.  Continues Wellbutrin.  Helps more with mood.  She had sleep study in 2019 and newly diagnosed OSA.  She is unable to tolerate the CPAP, will follow up with [name].   she has a diagnosis of anxiety and is currently on clonazepam 0.25 - 0.5 mg, along with Wellbutrin for smoking cessation.  Reports symptoms are fairly controlled on current regimen.  she has a diagnosis of GERD which is currently managed by omeprazole 40 mg daily, carafate PRN; had recent EGD in 04/2019 which was unremarkable excepting inflammation.   she reports symptoms is currently well controlled, and denies breakthrough reflux, burning in chest; she does have multifactorial chronic hoarseness and smoker's cough.   BMI is Body mass index is 27.06 kg/m., she has been working on diet and exercise. Wt Readings from Last 3 Encounters:  06/06/23 162 lb 9.6 oz (73.8 kg)  04/25/23 161 lb (73 kg)  01/09/23 159 lb 12.8 oz (72.5 kg)   She follows with [name]; Carotid Dopplers July 2013 showed less than 50% bilateral stenosis. Echocardiogram September 2018 showed normal LV function, trace aortic insufficiency and mild left atrial enlargement. Stress echocardiogram  October 2018 showed no stress-induced wall motion abnormalities. She has CAD and aortic atherosclerosis by CT 05/2019.    Her blood pressure has been controlled at home, today their BP is BP: (!) 160/82 She does not workout. She denies chest pain, shortness of breath, dizziness.   She is on cholesterol medication (she is on praluent injectible via cardiology, had allergic reaction repatha) and denies myalgias. Her cholesterol is at goal. The cholesterol last visit was:   Lab Results  Component Value Date   CHOL 188 11/20/2022   HDL 80 11/20/2022   LDLCALC 91 11/20/2022   TRIG 83 11/20/2022   CHOLHDL 2.4 11/20/2022    She has been working on diet and exercise for glucose management, and denies foot ulcerations, increased appetite, nausea, paresthesia of the feet, polydipsia, polyuria, visual disturbances, vomiting and weight loss. Last A1C in the office was:  Lab Results  Component Value Date   HGBA1C 5.8 (H) 11/20/2022   Last GFR: Lab Results  Component Value Date   GFRNONAA 74 (L) 01/07/2023   Patient is on Vitamin D supplement and at recent check:   Lab Results  Component Value Date   VD25OH 72 11/20/2022      Medication Review: Current Outpatient Medications on File Prior to Visit  Medication Sig Dispense Refill   albuterol (VENTOLIN HFA) 108 (90 Base) MCG/ACT inhaler Inhale 2 puffs into the lungs every 6 (six) hours as needed for wheezing or shortness of breath. 8 g 2   Alirocumab (PRALUENT) 150 MG/ML SOAJ Inject 1 mL (150 mg total) into the skin every 14 (fourteen) days. 6 mL 1   Ascorbic Acid (VITAMIN C) 1000  MG tablet Take 1,000 mg by mouth daily.     aspirin 81 MG tablet Take 81 mg by mouth every other day.      azelastine (ASTELIN) 0.1 % nasal spray Place 2 sprays into both nostrils daily.     azithromycin (ZITHROMAX) 250 MG tablet Take 2 tablets on  Day 1,  followed by 1 tablet  daily for 4 more days    for Sinusitis  /Bronchitis 6 each 1    bisoprolol-hydrochlorothiazide (ZIAC) 10-6.25 MG tablet Take 0.5 tablets by mouth daily. 45 tablet 2   buPROPion (WELLBUTRIN XL) 150 MG 24 hr tablet Take  1 tablet  2 x /day  for Mood, Focus & Concentration                             /                                                                     TAKE                                  BY                                           MOUTH 180 tablet 3   cetirizine (ZYRTEC) 10 MG tablet Take 10 mg by mouth daily.     Cholecalciferol (VITAMIN D3) 5000 UNITS TABS Take 5,000 Units by mouth daily.     clonazePAM (KLONOPIN) 0.5 MG tablet TAKE 1/2 TO 1 TABLET BY MOUTH ONCE  DAILY ONLY IF NEEDED FOR ANXIETY ATTACK AND LIMIT TO 5 DAYS PER WEEK TO AVOID ADDICTION AND DEMENTIA 50 tablet 0   clopidogrel (PLAVIX) 75 MG tablet Take  1 tablet  Daily  to Prevent Blood Clots 90 tablet 3   Cyanocobalamin (VITAMIN B12 PO) Take 1,000 mcg by mouth daily.     EPINEPHrine 0.3 mg/0.3 mL IJ SOAJ injection Inject 0.3 mg into the muscle as needed.     fluticasone (FLONASE) 50 MCG/ACT nasal spray 1 Spray each nostril daily 48 g 1   ipratropium-albuterol (DUONEB) 0.5-2.5 (3) MG/3ML SOLN Take 3 mLs by nebulization every 4 (four) hours as needed. 90 mL 2   losartan (COZAAR) 100 MG tablet Take  1 tablet Daily for BP                                          /                      TAKE                          BY                                MOUTH 90 tablet 3  methylcellulose oral powder Take by mouth daily.      montelukast (SINGULAIR) 10 MG tablet TAKE 1 TABLET(10 MG) BY MOUTH AT BEDTIME 30 tablet 11   Multiple Vitamins-Minerals (MULTI COMPLETE PO) Take 1 capsule by mouth daily.     NIFEdipine (PROCARDIA XL/NIFEDICAL XL) 60 MG 24 hr tablet Take 1 tablet every night for BP 90 tablet 3   Omega-3 Fatty Acids (FISH OIL) 1000 MG CAPS Take 1 capsule (1,000 mg total) by mouth 2 (two) times daily with a meal. (Patient taking differently: Take 1 capsule by mouth daily.) 60 capsule 11    omeprazole (PRILOSEC) 40 MG capsule Take  1 capsule  2 x /day to Prevent Heartburn & Indigestion                          /                                              TAKE                                             BY                                     MOUTH 180 capsule 3   promethazine-dextromethorphan (PROMETHAZINE-DM) 6.25-15 MG/5ML syrup Take 5 mLs by mouth 4 (four) times daily as needed for cough. 240 mL 0   Tiotropium Bromide-Olodaterol (STIOLTO RESPIMAT) 2.5-2.5 MCG/ACT AERS INHALE 2 PUFFS INTO THE LUNGS DAILY 8 g 1   Zinc 50 MG TABS Take 25 mg by mouth daily.     hydrocortisone (ANUSOL-HC) 2.5 % rectal cream Place 1 Application rectally 2 (two) times daily. Apply to external hemorrhoids 3 to 4  x / day (Patient not taking: Reported on 06/06/2023) 30 g 3   predniSONE (DELTASONE) 10 MG tablet 1 tab 3 x day for 2 days, then 1 tab 2 x day for 2 days, then 1 tab 1 x day for 3 days 13 tablet 0   No current facility-administered medications on file prior to visit.    Allergies  Allergen Reactions   Enalapril Swelling    Caused lips to swell and bloating.   Statins     Tingling with several statins   Repatha [Evolocumab]     Stated she didn't tolerate it and switched to Praluent    Current Problems (verified) Patient Active Problem List   Diagnosis Date Noted   Statin myopathy 11/19/2022   Statin Myalgia 11/19/2022   myopathy 11/19/2022   Cough 05/02/2022   Centrilobular emphysema (HCC) 12/27/2021   Tobacco abuse 12/27/2021   Lung nodules 12/27/2021   Palpitations 12/07/2020   3-vessel CAD 09/15/2020   Screening for ischemic heart disease 09/02/2020   FHx: heart disease 09/02/2020   Allergic rhinitis 01/06/2020   Statin Intolerance 12/12/2019   OSA (obstructive sleep apnea) 02/26/2018   COPD GOLD A-B 12/02/2017   Recurrent mild major depressive disorder with anxiety (HCC) 10/22/2017   Former smoker 10/22/2017   Aortic atherosclerosis by Abd CT scan in 2018  (HCC)  05/16/2017   GERD (gastroesophageal reflux disease) 04/10/2017  Abnormal glucose 03/20/2014   Essential hypertension 06/15/2013   Hyperlipidemia, mixed 06/15/2013   Vitamin D deficiency 06/15/2013    Screening Tests Immunization History  Administered Date(s) Administered   Influenza, High Dose Seasonal PF 04/10/2017, 05/08/2019, 05/24/2020, 04/24/2021, 05/07/2022   Influenza-Unspecified 03/05/2018   PFIZER(Purple Top)SARS-COV-2 Vaccination 08/13/2019, 09/09/2019   PPD Test 09/21/2014   Pneumococcal Conjugate-13 10/22/2017   Pneumococcal Polysaccharide-23 02/07/2011, 01/26/2016   Tdap 02/07/2011   Preventative care: Last colonoscopy: 2015, 10 year follow up Due 2025 EGD: 2020 Mammogram: 06/2022  , at Obgyn, Due Pelvic exam: at [facility], never abnormal, [facility], last 10/2021 Dexa: 06/2023 normal per patient   CT chest smoker: 02/19/2023 Recall 1 Year Prior vaccinations: TD or Tdap: 2012  Influenza: 05/2023 Pneumococcal: 2012, 2017 Prevnar13: 2019 Shingrix: Received 1st/ Plans to get   Names of Other Physician/Practitioners you currently use: 1. [facility] here for primary care 2. [facility] doctor, last visit 2024, has mild R cataract - Refer - reading glasses.  Has had left cataract removed  3. [facility], 12/2022  Patient Care Team: Sherri Cowboy, MD as PCP - General (Internal Medicine) Jens Solomon Madolyn Frieze, MD as PCP - Cardiology (Cardiology) Lewayne Bunting, MD as Consulting Physician (Cardiology)  SURGICAL HISTORY She  has a past surgical history that includes Appendectomy; Partial hysterectomy; Lumbar fusion (02/06/2012); Back surgery; Dilation and curettage of uterus; Total hip arthroplasty (06/02/2012); Total knee arthroplasty (Right, 12/15/2012); Total hip arthroplasty (Left, 01/25/2016); Total hip arthroplasty (Left, 01/25/2016); Total knee arthroplasty (Left, 06/18/2016); Colonoscopy; Upper gastrointestinal  endoscopy (05/06/2019); Polypectomy; and Cataract extraction (Left, 2019). FAMILY HISTORY Her family history includes CAD in her father; Cancer in her maternal grandmother; Colon cancer (age of onset: 33) in her sister; Colon cancer (age of onset: 42) in her mother; Diabetes in her mother; Hypertension in her brother, brother, and father; Lung cancer (age of onset: 54) in her brother; Peripheral Artery Disease in her paternal grandfather. SOCIAL HISTORY She  reports that she has been smoking cigarettes. She started smoking about 39 years ago. She has a 39.9 pack-year smoking history. She has never used smokeless tobacco. She reports current alcohol use. She reports current drug use. Drug: Marijuana.   Review of Systems  Constitutional:  Negative for malaise/fatigue and weight loss.  HENT:  Negative for congestion, hearing loss, sore throat and tinnitus.   Eyes:  Negative for blurred vision and double vision.  Respiratory:  Positive for cough (chronic) and sputum production. Negative for shortness of breath and wheezing.   Cardiovascular:  Negative for chest pain, palpitations, orthopnea, claudication, leg swelling and PND.  Gastrointestinal:  Negative for abdominal pain, blood in stool, constipation, diarrhea, heartburn, melena, nausea and vomiting.  Genitourinary: Negative.   Musculoskeletal:  Negative for falls, joint pain and myalgias.  Skin:  Negative for rash.  Neurological:  Negative for dizziness, tingling, sensory change, weakness and headaches.  Endo/Heme/Allergies:  Negative for environmental allergies and polydipsia.  Psychiatric/Behavioral: Negative.  Negative for depression, memory loss, substance abuse and suicidal ideas. The patient is not nervous/anxious and does not have insomnia.   All other systems reviewed and are negative.    Objective:     Today's Vitals   06/06/23 0948  BP: (!) 160/82  Pulse: 71  Temp: 97.6 F (36.4 C)  SpO2: 99%  Weight: 162 lb 9.6 oz (73.8  kg)  Height: 5\' 5"  (1.651 m)   Body mass index is 27.06 kg/m.  General appearance: alert,  no distress, WD/WN, female HEENT: normocephalic, sclerae anicteric, TMs pearly, nares patent, no discharge or erythema, pharynx normal Oral cavity: MMM, no lesions Neck: supple, no lymphadenopathy, no thyromegaly, no masses Heart: RRR, normal S1, S2, no murmurs Lungs: Scattered rhonchi to bilateral bases without wheezes or rales Abdomen: +bs, soft, non tender, non distended, no masses, no hepatomegaly, no splenomegaly Musculoskeletal: nontender, no swelling, no obvious deformity Extremities: no edema, no cyanosis, no clubbing Pulses: 2+ symmetric, upper and lower extremities, normal cap refill Neurological: alert, oriented x 3, CN2-12 intact, strength normal upper extremities and lower extremities, sensation normal throughout, DTRs 2+ throughout, no cerebellar signs, gait normal Psychiatric: normal affect, behavior normal, pleasant   EKG: NSR  Malyia Moro, NP   06/06/2023

## 2023-06-06 NOTE — Patient Instructions (Signed)
Managing the Challenge of Quitting Smoking Quitting smoking is a physical and mental challenge. You may have cravings, withdrawal symptoms, and temptation to smoke. Before quitting, work with your health care provider to make a plan that can help you manage quitting. Making a plan before you quit may keep you from smoking when you have the urge to smoke while trying to quit. How to manage lifestyle changes Managing stress Stress can make you want to smoke, and wanting to smoke may cause stress. It is important to find ways to manage your stress. You could try some of the following: Practice relaxation techniques. Breathe slowly and deeply, in through your nose and out through your mouth. Listen to music. Soak in a bath or take a shower. Imagine a peaceful place or vacation. Get some support. Talk with family or friends about your stress. Join a support group. Talk with a counselor or therapist. Get some physical activity. Go for a walk, run, or bike ride. Play a favorite sport. Practice yoga.  Medicines Talk with your health care provider about medicines that might help you deal with cravings and make quitting easier for you. Relationships Social situations can be difficult when you are quitting smoking. To manage this, you can: Avoid parties and other social situations where people might be smoking. Avoid alcohol. Leave right away if you have the urge to smoke. Explain to your family and friends that you are quitting smoking. Ask for support and let them know you might be a bit grumpy. Plan activities where smoking is not an option. General instructions Be aware that many people gain weight after they quit smoking. However, not everyone does. To keep from gaining weight, have a plan in place before you quit, and stick to the plan after you quit. Your plan should include: Eating healthy snacks. When you have a craving, it may help to: Eat popcorn, or try carrots, celery, or other cut  vegetables. Chew sugar-free gum. Changing how you eat. Eat small portion sizes at meals. Eat 4-6 small meals throughout the day instead of 1-2 large meals a day. Be mindful when you eat. You should avoid watching television or doing other things that might distract you as you eat. Exercising regularly. Make time to exercise each day. If you do not have time for a long workout, do short bouts of exercise for 5-10 minutes several times a day. Do some form of strengthening exercise, such as weight lifting. Do some exercise that gets your heart beating and causes you to breathe deeply, such as walking fast, running, swimming, or biking. This is very important. Drinking plenty of water or other low-calorie or no-calorie drinks. Drink enough fluid to keep your urine pale yellow.  How to recognize withdrawal symptoms Your body and mind may experience discomfort as you try to get used to not having nicotine in your system. These effects are called withdrawal symptoms. They may include: Feeling hungrier than normal. Having trouble concentrating. Feeling irritable or restless. Having trouble sleeping. Feeling depressed. Craving a cigarette. These symptoms may surprise you, but they are normal to have when quitting smoking. To manage withdrawal symptoms: Avoid places, people, and activities that trigger your cravings. Remember why you want to quit. Get plenty of sleep. Avoid coffee and other drinks that contain caffeine. These may worsen some of your symptoms. How to manage cravings Come up with a plan for how to deal with your cravings. The plan should include the following: A definition of the specific situation  you want to deal with. An activity or action you will take to replace smoking. A clear idea for how this action will help. The name of someone who could help you with this. Cravings usually last for 5-10 minutes. Consider taking the following actions to help you with your plan to deal  with cravings: Keep your mouth busy. Chew sugar-free gum. Suck on hard candies or a straw. Brush your teeth. Keep your hands and body busy. Change to a different activity right away. Squeeze or play with a ball. Do an activity or a hobby, such as making bead jewelry, practicing needlepoint, or working with wood. Mix up your normal routine. Take a short exercise break. Go for a quick walk, or run up and down stairs. Focus on doing something kind or helpful for someone else. Call a friend or family member to talk during a craving. Join a support group. Contact a quitline. Where to find support To get help or find a support group: Call the National Cancer Institute's Smoking Quitline: 1-800-QUIT-NOW (951)132-3306) Text QUIT to SmokefreeTXT: 147829 Where to find more information Visit these websites to find more information on quitting smoking: U.S. Department of Health and Human Services: www.smokefree.gov American Lung Association: www.freedomfromsmoking.org Centers for Disease Control and Prevention (CDC): FootballExhibition.com.br American Heart Association: www.heart.org Contact a health care provider if: You want to change your plan for quitting. The medicines you are taking are not helping. Your eating feels out of control or you cannot sleep. You feel depressed or become very anxious. Summary Quitting smoking is a physical and mental challenge. You will face cravings, withdrawal symptoms, and temptation to smoke again. Preparation can help you as you go through these challenges. Try different techniques to manage stress, handle social situations, and prevent weight gain. You can deal with cravings by keeping your mouth busy (such as by chewing gum), keeping your hands and body busy, calling family or friends, or contacting a quitline for people who want to quit smoking. You can deal with withdrawal symptoms by avoiding places where people smoke, getting plenty of rest, and avoiding drinks that  contain caffeine. This information is not intended to replace advice given to you by your health care provider. Make sure you discuss any questions you have with your health care provider. Document Revised: 06/23/2021 Document Reviewed: 06/23/2021 Elsevier Patient Education  2024 Elsevier Inc.   Influenza (Flu) Vaccine (Inactivated or Recombinant): What You Need to Know Many vaccine information statements are available in Spanish and other languages. See PromoAge.com.br. 1. Why get vaccinated? Influenza vaccine can prevent influenza (flu). Flu is a contagious disease that spreads around the [place] every year, usually between October and May. Anyone can get the flu, but it is more dangerous for some people. Infants and young children, people 44 years and older, pregnant people, and people with certain health conditions or a weakened immune system are at greatest risk of flu complications. Pneumonia, bronchitis, sinus infections, and ear infections are examples of flu-related complications. If you have a medical condition, such as heart disease, cancer, or diabetes, flu can make it worse. Flu can cause fever and chills, sore throat, muscle aches, fatigue, cough, headache, and runny or stuffy nose. Some people may have vomiting and diarrhea, though this is more common in children than adults. In an average year, thousands of people in the [place] die from flu, and many more are hospitalized. Flu vaccine prevents millions of illnesses and flu-related visits to the doctor each year. 2.  Influenza vaccines CDC recommends everyone 6 months and older get vaccinated every flu season. Children 6 months through 40 years of age may need 2 doses during a single flu season. Everyone else needs only 1 dose each flu season. It takes about 2 weeks for protection to develop after vaccination. There are many flu viruses, and they are always changing. Each year a new flu vaccine is made to protect  against the influenza viruses believed to be likely to cause disease in the upcoming flu season. Even when the vaccine doesn't exactly match these viruses, it may still provide some protection. Influenza vaccine does not cause flu. Influenza vaccine may be given at the same time as other vaccines. 3. Talk with your health care provider Tell your vaccination provider if the person getting the vaccine: Has had an allergic reaction after a previous dose of influenza vaccine, or has any severe, life-threatening allergies Has ever had Guillain-Barr Syndrome (also called "GBS") In some cases, your health care provider may decide to postpone influenza vaccination until a future visit. Influenza vaccine can be administered at any time during pregnancy. People who are or will be pregnant during influenza season should receive inactivated influenza vaccine. People with minor illnesses, such as a cold, may be vaccinated. People who are moderately or severely ill should usually wait until they recover before getting influenza vaccine. Your health care provider can give you more information. 4. Risks of a vaccine reaction Soreness, redness, and swelling where the shot is given, fever, muscle aches, and headache can happen after influenza vaccination. There may be a very small increased risk of Guillain-Barr Syndrome (GBS) after inactivated influenza vaccine (the flu shot). Young children who get the flu shot along with pneumococcal vaccine (PCV13) and/or DTaP vaccine at the same time might be slightly more likely to have a seizure caused by fever. Tell your health care provider if a child who is getting flu vaccine has ever had a seizure. People sometimes faint after medical procedures, including vaccination. Tell your provider if you feel dizzy or have vision changes or ringing in the ears. As with any medicine, there is a very remote chance of a vaccine causing a severe allergic reaction, other serious  injury, or death. 5. What if there is a serious problem? An allergic reaction could occur after the vaccinated person leaves the clinic. If you see signs of a severe allergic reaction (hives, swelling of the face and throat, difficulty breathing, a fast heartbeat, dizziness, or weakness), call 9-1-1 and get the person to the nearest hospital. For other signs that concern you, call your health care provider. Adverse reactions should be reported to the Vaccine Adverse Event Reporting System (VAERS). Your health care provider will usually file this report, or you can do it yourself. Visit the VAERS website at www.vaers.LAgents.no or call 862-055-8701. VAERS is only for reporting reactions, and VAERS staff members do not give medical advice. 6. The National Vaccine Injury Compensation Program The [employer] Vaccine Injury Compensation Program (VICP) is a federal program that was created to compensate people who may have been injured by certain vaccines. Claims regarding alleged injury or death due to vaccination have a time limit for filing, which may be as short as two years. Visit the VICP website at SpiritualWord.at or call 9183030275 to learn about the program and about filing a claim. 7. How can I learn more? Ask your health care provider. Call your local or state health department. Visit the website of the Food and  Drug Administration (FDA) for vaccine package inserts and additional information at FinderList.no. Contact the Centers for Disease Control and Prevention (CDC): Call 347 686 7269 (1-800-CDC-INFO) or Visit CDC's website at BiotechRoom.com.cy. Source: CDC Vaccine Information Statement Inactivated Influenza Vaccine (02/19/2020) This same material is available at FootballExhibition.com.br for no charge. This information is not intended to replace advice given to you by your health care provider. Make sure you discuss any questions you have with your health  care provider. Document Revised: 10/17/2022 Document Reviewed: 07/23/2022 Elsevier Patient Education  2024 [employer].

## 2023-06-07 LAB — COMPLETE METABOLIC PANEL WITH GFR
AG Ratio: 1.8 (calc) (ref 1.0–2.5)
ALT: 7 U/L (ref 6–29)
AST: 13 U/L (ref 10–35)
Albumin: 4.2 g/dL (ref 3.6–5.1)
Alkaline phosphatase (APISO): 77 U/L (ref 37–153)
BUN: 18 mg/dL (ref 7–25)
CO2: 29 mmol/L (ref 20–32)
Calcium: 9.4 mg/dL (ref 8.6–10.4)
Chloride: 102 mmol/L (ref 98–110)
Creat: 0.79 mg/dL (ref 0.60–1.00)
Globulin: 2.4 g/dL (ref 1.9–3.7)
Glucose, Bld: 87 mg/dL (ref 65–99)
Potassium: 4.3 mmol/L (ref 3.5–5.3)
Sodium: 139 mmol/L (ref 135–146)
Total Bilirubin: 0.6 mg/dL (ref 0.2–1.2)
Total Protein: 6.6 g/dL (ref 6.1–8.1)
eGFR: 79 mL/min/{1.73_m2} (ref >=60)

## 2023-06-07 LAB — LIPID PANEL
Cholesterol: 190 mg/dL (ref <200)
HDL: 93 mg/dL (ref >=50)
LDL Cholesterol (Calc): 80 mg/dL
Non-HDL Cholesterol (Calc): 97 mg/dL (ref <130)
Total CHOL/HDL Ratio: 2 (calc) (ref <5.0)
Triglycerides: 90 mg/dL (ref <150)

## 2023-06-07 LAB — CBC WITH DIFFERENTIAL/PLATELET
Absolute Lymphocytes: 2009 {cells}/uL (ref 850–3900)
Absolute Monocytes: 403 {cells}/uL (ref 200–950)
Basophils Absolute: 42 {cells}/uL (ref 0–200)
Basophils Relative: 0.8 %
Eosinophils Absolute: 69 {cells}/uL (ref 15–500)
Eosinophils Relative: 1.3 %
HCT: 41.3 % (ref 35.0–45.0)
Hemoglobin: 13.4 g/dL (ref 11.7–15.5)
MCH: 26.3 pg — ABNORMAL LOW (ref 27.0–33.0)
MCHC: 32.4 g/dL (ref 32.0–36.0)
MCV: 81.1 fL (ref 80.0–100.0)
MPV: 10.5 fL (ref 7.5–12.5)
Monocytes Relative: 7.6 %
Neutro Abs: 2777 {cells}/uL (ref 1500–7800)
Neutrophils Relative %: 52.4 %
Platelets: 301 10*3/uL (ref 140–400)
RBC: 5.09 10*6/uL (ref 3.80–5.10)
RDW: 14.4 % (ref 11.0–15.0)
Total Lymphocyte: 37.9 %
WBC: 5.3 10*3/uL (ref 3.8–10.8)

## 2023-06-07 LAB — HEMOGLOBIN A1C
Hgb A1c MFr Bld: 5.7 %{Hb} — ABNORMAL HIGH (ref <5.7)
Mean Plasma Glucose: 117 mg/dL
eAG (mmol/L): 6.5 mmol/L

## 2023-06-07 LAB — VITAMIN B12: Vitamin B-12: 897 pg/mL (ref 200–1100)

## 2023-06-07 LAB — MICROALBUMIN / CREATININE URINE RATIO
Creatinine, Urine: 60 mg/dL (ref 20–275)
Microalb Creat Ratio: 33 mg/g{creat} — ABNORMAL HIGH (ref <30)
Microalb, Ur: 2 mg/dL

## 2023-06-07 LAB — URINALYSIS, ROUTINE W REFLEX MICROSCOPIC
Bilirubin Urine: NEGATIVE
Glucose, UA: NEGATIVE
Hgb urine dipstick: NEGATIVE
Ketones, ur: NEGATIVE
Leukocytes,Ua: NEGATIVE
Nitrite: NEGATIVE
Protein, ur: NEGATIVE
Specific Gravity, Urine: 1.013 (ref 1.001–1.035)
pH: 6 (ref 5.0–8.0)

## 2023-06-07 LAB — INSULIN, RANDOM: Insulin: 5.2 u[IU]/mL

## 2023-06-07 LAB — VITAMIN D 25 HYDROXY (VIT D DEFICIENCY, FRACTURES): Vit D, 25-Hydroxy: 63 ng/mL (ref 30–100)

## 2023-06-07 LAB — MAGNESIUM: Magnesium: 1.9 mg/dL (ref 1.5–2.5)

## 2023-06-07 LAB — TSH: TSH: 0.75 m[IU]/L (ref 0.40–4.50)

## 2023-07-02 DIAGNOSIS — J3081 Allergic rhinitis due to animal (cat) (dog) hair and dander: Secondary | ICD-10-CM | POA: Diagnosis not present

## 2023-07-02 DIAGNOSIS — J301 Allergic rhinitis due to pollen: Secondary | ICD-10-CM | POA: Diagnosis not present

## 2023-07-02 DIAGNOSIS — J3089 Other allergic rhinitis: Secondary | ICD-10-CM | POA: Diagnosis not present

## 2023-07-16 ENCOUNTER — Telehealth: Payer: Self-pay | Admitting: Cardiology

## 2023-07-16 NOTE — Telephone Encounter (Signed)
 Pt c/o medication issue:  1. Name of Medication:  Praluent   2. How are you currently taking this medication (dosage and times per day)?   3. Are you having a reaction (difficulty breathing--STAT)?   4. What is your medication issue?   [place] with [facility] states as of 07/17/23 Praluent  will no longer be covered, but they will cover Repatha .

## 2023-07-18 ENCOUNTER — Other Ambulatory Visit (HOSPITAL_COMMUNITY): Payer: Self-pay

## 2023-07-18 ENCOUNTER — Telehealth: Payer: Self-pay | Admitting: Pharmacy Technician

## 2023-07-18 DIAGNOSIS — I2510 Atherosclerotic heart disease of native coronary artery without angina pectoris: Secondary | ICD-10-CM

## 2023-07-18 DIAGNOSIS — E782 Mixed hyperlipidemia: Secondary | ICD-10-CM

## 2023-07-18 NOTE — Telephone Encounter (Signed)
 Pharmacy Patient Advocate Encounter  Received notification from [employer] that Prior Authorization for repatha  has been APPROVED from 07/18/23 to 01/14/24. Ran test claim, Copay is $47.00 one month. This test claim was processed through [facility]- copay amounts may vary at other pharmacies due to pharmacy/plan contracts, or as the patient moves through the different stages of their insurance plan.   PA #/Case ID/Reference #: [id number]

## 2023-07-18 NOTE — Telephone Encounter (Signed)
 Pharmacy Patient Advocate Encounter   Received notification from Pt Calls Messages that prior authorization for repatha  is required/requested.   Insurance verification completed.   The patient is insured through [facility] ADVANT[employer]/RX ADVANCE .   Per test claim: PA required; PA submitted to above mentioned insurance via CoverMyMeds Key/confirmation #/EOC [facility] Status is pending

## 2023-07-22 ENCOUNTER — Other Ambulatory Visit (HOSPITAL_COMMUNITY): Payer: Self-pay

## 2023-07-22 NOTE — Telephone Encounter (Signed)
 Pharmacy Patient Advocate Encounter   Received notification from Pt Calls Messages that prior authorization for Praluent  is required/requested.   Insurance verification completed.   The patient is insured through [facility] .   Per test claim: The current 28 day co-pay is, $47.00.  No PA needed at this time. This test claim was processed through [facility]- copay amounts may vary at other pharmacies due to pharmacy/plan contracts, or as the patient moves through the different stages of their insurance plan.

## 2023-07-24 MED ORDER — PRALUENT 150 MG/ML ~~LOC~~ SOAJ
150.0000 mg | SUBCUTANEOUS | 1 refills | Status: DC
Start: 2023-07-24 — End: 2023-10-11

## 2023-07-24 NOTE — Addendum Note (Signed)
 Addended by: Cheree Ditto on: 07/24/2023 11:07 AM   Modules accepted: Orders

## 2023-08-08 ENCOUNTER — Other Ambulatory Visit: Payer: Self-pay

## 2023-08-08 DIAGNOSIS — R1013 Epigastric pain: Secondary | ICD-10-CM

## 2023-08-08 DIAGNOSIS — R079 Chest pain, unspecified: Secondary | ICD-10-CM

## 2023-08-08 MED ORDER — OMEPRAZOLE 40 MG PO CPDR
DELAYED_RELEASE_CAPSULE | ORAL | 3 refills | Status: AC
Start: 2023-08-08 — End: ?

## 2023-09-12 DIAGNOSIS — J301 Allergic rhinitis due to pollen: Secondary | ICD-10-CM | POA: Diagnosis not present

## 2023-09-12 DIAGNOSIS — J3089 Other allergic rhinitis: Secondary | ICD-10-CM | POA: Diagnosis not present

## 2023-09-17 DIAGNOSIS — R5383 Other fatigue: Secondary | ICD-10-CM | POA: Diagnosis not present

## 2023-09-17 DIAGNOSIS — J432 Centrilobular emphysema: Secondary | ICD-10-CM | POA: Diagnosis not present

## 2023-09-17 DIAGNOSIS — I10 Essential (primary) hypertension: Secondary | ICD-10-CM | POA: Diagnosis not present

## 2023-09-17 DIAGNOSIS — I2510 Atherosclerotic heart disease of native coronary artery without angina pectoris: Secondary | ICD-10-CM | POA: Diagnosis not present

## 2023-09-17 DIAGNOSIS — E782 Mixed hyperlipidemia: Secondary | ICD-10-CM | POA: Diagnosis not present

## 2023-09-17 DIAGNOSIS — J410 Simple chronic bronchitis: Secondary | ICD-10-CM | POA: Diagnosis not present

## 2023-09-17 DIAGNOSIS — F17200 Nicotine dependence, unspecified, uncomplicated: Secondary | ICD-10-CM | POA: Diagnosis not present

## 2023-10-11 ENCOUNTER — Other Ambulatory Visit (HOSPITAL_COMMUNITY): Payer: Self-pay

## 2023-10-11 ENCOUNTER — Telehealth: Payer: Self-pay | Admitting: Cardiology

## 2023-10-11 DIAGNOSIS — I2510 Atherosclerotic heart disease of native coronary artery without angina pectoris: Secondary | ICD-10-CM

## 2023-10-11 DIAGNOSIS — E782 Mixed hyperlipidemia: Secondary | ICD-10-CM

## 2023-10-11 MED ORDER — PRALUENT 150 MG/ML ~~LOC~~ SOAJ
150.0000 mg | SUBCUTANEOUS | 1 refills | Status: DC
  Filled 2023-10-11: qty 2, 28d supply, fill #0

## 2023-10-11 NOTE — Telephone Encounter (Signed)
*  STAT* If patient is at the pharmacy, call can be transferred to refill team.   1. Which medications need to be refilled? (please list name of each medication and dose if known) Alirocumab (PRALUENT) 150 MG/ML SOAJ   2. Which pharmacy/location (including street and city if local pharmacy) is medication to be sent to? [id number] [place], [place] - [address] AT [facility] [address] Phone: [phone]  Fax: [phone]     3. Do they need a 30 day or 90 day supply? 90

## 2023-10-14 ENCOUNTER — Other Ambulatory Visit (HOSPITAL_COMMUNITY): Payer: Self-pay

## 2023-10-14 ENCOUNTER — Other Ambulatory Visit (HOSPITAL_BASED_OUTPATIENT_CLINIC_OR_DEPARTMENT_OTHER): Payer: Self-pay

## 2023-10-14 MED ORDER — BUPROPION HCL ER (XL) 150 MG PO TB24
150.0000 mg | ORAL_TABLET | Freq: Two times a day (BID) | ORAL | 0 refills | Status: AC
  Filled 2023-10-14 – 2023-10-21 (×3): qty 60, 30d supply, fill #0

## 2023-10-15 ENCOUNTER — Other Ambulatory Visit (HOSPITAL_COMMUNITY): Payer: Self-pay

## 2023-10-17 ENCOUNTER — Other Ambulatory Visit (HOSPITAL_BASED_OUTPATIENT_CLINIC_OR_DEPARTMENT_OTHER): Payer: Self-pay

## 2023-10-21 ENCOUNTER — Other Ambulatory Visit (HOSPITAL_COMMUNITY): Payer: Self-pay

## 2023-10-21 ENCOUNTER — Other Ambulatory Visit (HOSPITAL_BASED_OUTPATIENT_CLINIC_OR_DEPARTMENT_OTHER): Payer: Self-pay

## 2023-10-23 ENCOUNTER — Other Ambulatory Visit (HOSPITAL_COMMUNITY): Payer: Self-pay

## 2023-10-30 ENCOUNTER — Other Ambulatory Visit (HOSPITAL_COMMUNITY): Payer: Self-pay

## 2023-11-01 ENCOUNTER — Telehealth: Payer: Self-pay | Admitting: Cardiology

## 2023-11-01 ENCOUNTER — Telehealth: Payer: Self-pay | Admitting: Pharmacy Technician

## 2023-11-01 ENCOUNTER — Other Ambulatory Visit (HOSPITAL_COMMUNITY): Payer: Self-pay

## 2023-11-01 NOTE — Telephone Encounter (Signed)
 Please do PA for Praluent  continuation - new insurance.

## 2023-11-01 NOTE — Telephone Encounter (Signed)
 Pharmacy Patient Advocate Encounter   Received notification from Pt Calls Messages that prior authorization for praluent is required/requested.   Insurance verification completed.   The patient is insured through [facility] [employer]/RX ADVANCE .

## 2023-11-01 NOTE — Telephone Encounter (Signed)
  Pt c/o medication issue:  1. Name of Medication: Alirocumab  (PRALUENT ) 150 MG/ML SOAJ   2. How are you currently taking this medication (dosage and times per day)? Inject 1 mL (150 mg total) into the skin every 14 (fourteen) days.   3. Are you having a reaction (difficulty breathing--STAT)? No   4. What is your medication issue? Pt said, since she changed new insurance and new pharmacy this medication need prir auth. She gave [employer] number [phone]

## 2023-11-04 ENCOUNTER — Other Ambulatory Visit (HOSPITAL_COMMUNITY): Payer: Self-pay

## 2023-11-04 NOTE — Telephone Encounter (Signed)
 Pharmacy Patient Advocate Encounter  Received notification from [employer] that Prior Authorization for praluent  has been APPROVED from 11/01/23 to 05/02/24. Spoke to pharmacy to process.Copay is $too soon until 11/12/23.    PA #/Case ID/Reference #: [id number]

## 2023-11-05 DIAGNOSIS — I10 Essential (primary) hypertension: Secondary | ICD-10-CM | POA: Diagnosis not present

## 2023-11-05 DIAGNOSIS — I2510 Atherosclerotic heart disease of native coronary artery without angina pectoris: Secondary | ICD-10-CM | POA: Diagnosis not present

## 2023-11-05 DIAGNOSIS — F17200 Nicotine dependence, unspecified, uncomplicated: Secondary | ICD-10-CM | POA: Diagnosis not present

## 2023-11-05 DIAGNOSIS — E782 Mixed hyperlipidemia: Secondary | ICD-10-CM | POA: Diagnosis not present

## 2023-11-05 DIAGNOSIS — Z0000 Encounter for general adult medical examination without abnormal findings: Secondary | ICD-10-CM | POA: Diagnosis not present

## 2023-11-05 DIAGNOSIS — J410 Simple chronic bronchitis: Secondary | ICD-10-CM | POA: Diagnosis not present

## 2023-11-05 DIAGNOSIS — J432 Centrilobular emphysema: Secondary | ICD-10-CM | POA: Diagnosis not present

## 2023-11-05 DIAGNOSIS — R5383 Other fatigue: Secondary | ICD-10-CM | POA: Diagnosis not present

## 2023-11-07 ENCOUNTER — Encounter: Payer: Self-pay | Admitting: Gastroenterology

## 2023-11-12 DIAGNOSIS — F17200 Nicotine dependence, unspecified, uncomplicated: Secondary | ICD-10-CM | POA: Diagnosis not present

## 2023-11-12 DIAGNOSIS — Z0000 Encounter for general adult medical examination without abnormal findings: Secondary | ICD-10-CM | POA: Diagnosis not present

## 2023-11-12 DIAGNOSIS — I2510 Atherosclerotic heart disease of native coronary artery without angina pectoris: Secondary | ICD-10-CM | POA: Diagnosis not present

## 2023-11-12 DIAGNOSIS — Z23 Encounter for immunization: Secondary | ICD-10-CM | POA: Diagnosis not present

## 2023-11-12 DIAGNOSIS — E782 Mixed hyperlipidemia: Secondary | ICD-10-CM | POA: Diagnosis not present

## 2023-11-12 DIAGNOSIS — J432 Centrilobular emphysema: Secondary | ICD-10-CM | POA: Diagnosis not present

## 2023-11-12 DIAGNOSIS — I10 Essential (primary) hypertension: Secondary | ICD-10-CM | POA: Diagnosis not present

## 2023-11-12 DIAGNOSIS — J410 Simple chronic bronchitis: Secondary | ICD-10-CM | POA: Diagnosis not present

## 2023-11-12 DIAGNOSIS — R5383 Other fatigue: Secondary | ICD-10-CM | POA: Diagnosis not present

## 2023-11-13 ENCOUNTER — Ambulatory Visit: Payer: Medicare Other | Admitting: Nurse Practitioner

## 2023-11-14 ENCOUNTER — Other Ambulatory Visit (HOSPITAL_COMMUNITY): Payer: Self-pay

## 2024-01-01 ENCOUNTER — Telehealth: Payer: Self-pay

## 2024-01-01 ENCOUNTER — Encounter: Payer: Self-pay | Admitting: Gastroenterology

## 2024-01-01 ENCOUNTER — Ambulatory Visit (INDEPENDENT_AMBULATORY_CARE_PROVIDER_SITE_OTHER): Payer: Self-pay | Admitting: Gastroenterology

## 2024-01-01 VITALS — BP 130/72 | HR 78 | Ht 65.0 in | Wt 164.0 lb

## 2024-01-01 DIAGNOSIS — R140 Abdominal distension (gaseous): Secondary | ICD-10-CM

## 2024-01-01 DIAGNOSIS — R152 Fecal urgency: Secondary | ICD-10-CM

## 2024-01-01 DIAGNOSIS — Z800 Family history of malignant neoplasm of digestive organs: Secondary | ICD-10-CM

## 2024-01-01 MED ORDER — NA SULFATE-K SULFATE-MG SULF 17.5-3.13-1.6 GM/177ML PO SOLN: 1.0000 | Freq: Once | ORAL | 0 refills | Status: AC

## 2024-01-01 NOTE — Progress Notes (Addendum)
 [facility] Consult Note:  History: [name] 01/01/2024  Referring provider: Vangie Genet, MD  Reason for consult/chief complaint: Colonoscopy (Patient states she is here to discuss getting a colonoscopy. Patient stated that she is also having urgency with bowels. She stated that stools are normal.)   Subjective  Prior history:  Screening colonoscopy with Dr. Arvie Latus July 2015 Diminutive hepatic flexure erosion, biopsy benign.  Diminutive left colon polypoid lesion, lymphoid polyp pathology  EGD October 2020 with Dr. Dominic Friendly for persistent reflux symptoms.  No esophagitis.  Small island(s) distal esophageal salmon-colored mucosa, biopsies changes of reflux, no Barrett's.  Smoking cessation advised in addition to antireflux diet and lifestyle measures.  Discussed the use of AI scribe software for clinical note transcription with the patient, who gave verbal consent to proceed.  History of Present Illness [name] is a 73 year old female who presents for a follow-up regarding her colonoscopy screening.  Her last colonoscopy was in July 2015, during which a small non-precancerous polyp was found. She was advised to follow a ten-year interval for her next screening. However, she is concerned due to her significant family history of colon cancer.  She has a significant family history of colon cancer, with her mother and two sisters having been diagnosed with the disease. All three relatives passed away from colon cancer, with diagnoses occurring after her last colonoscopy. Her sisters were diagnosed in their mid-fifties and mid-sixties, respectively.  She has been experiencing bowel urgency for the past six months, describing it as 'I get there, I got to get there,' with no waiting time. Her stools are regular but firm, requiring the use of Colace to keep them soft. She also reports bloating and has been taking probiotics, which initially helped but are no  longer effective. No blood in stools, abnormal uterine or vaginal bleeding, nausea, vomiting, or difficulty swallowing. She has experienced a slight decrease in appetite and occasional nausea.     ROS:  Review of Systems  Constitutional:  Negative for appetite change and unexpected weight change.  HENT:  Negative for mouth sores and voice change.   Eyes:  Negative for pain and redness.  Respiratory:  Negative for cough and shortness of breath.   Cardiovascular:  Negative for chest pain and palpitations.  Genitourinary:  Negative for dysuria and hematuria.  Musculoskeletal:  Positive for arthralgias. Negative for myalgias.  Skin:  Negative for pallor and rash.  Neurological:  Negative for weakness and headaches.  Hematological:  Negative for adenopathy.     Past Medical History: Past Medical History:  Diagnosis Date   Allergy    enviromental   Anxiety    Arthritis    osteoarthritis   COPD (chronic obstructive pulmonary disease) (HCC)    mild   Diabetes mellitus without complication (HCC)    controlled by diet   Environmental allergies    GERD (gastroesophageal reflux disease)    Hepatitis    hepatitis A   History of colon polyps    Hyperlipidemia    Hypertension    IBS (irritable bowel syndrome)    Mixed hyperlipidemia 06/15/2013   PONV (postoperative nausea and vomiting)    second surgery no problems last surgery   Solitary pulmonary nodule 06/02/2019   Urgency of urination    Takes oxybutynin ; under control at this time   Last cardiology office note from 01/09/2023 reviewed, which was after an ED visit for syncope.  Wearable cardiac monitor done, later phone result note indicates  results reassuring. Overall reassuring cardiac monitor results. There were no arrhythmias noted that would cause a syncopal event. There was evidence of fast heart beats that originate from the top chambers of the heart, we can discuss this and possible medication changes if needed at follow  up on 02/20/23.     Past Surgical History: Past Surgical History:  Procedure Laterality Date   APPENDECTOMY     removed as child   BACK SURGERY     lumbar fusion   CATARACT EXTRACTION Left 2019   COLONOSCOPY     DILATION AND CURETTAGE OF UTERUS     LUMBAR FUSION  02/06/2012   L 4  L5   PARTIAL HYSTERECTOMY     POLYPECTOMY     TOTAL HIP ARTHROPLASTY  06/02/2012   Procedure: TOTAL HIP ARTHROPLASTY;  Surgeon: Ilean Mall, MD;  Location: [facility] OR;  Service: Orthopedics;  Laterality: Right;   TOTAL HIP ARTHROPLASTY Left 01/25/2016   TOTAL HIP ARTHROPLASTY Left 01/25/2016   Procedure: LEFT TOTAL HIP ARTHROPLASTY;  Surgeon: Wendolyn Hamburger, MD;  Location: [facility] OR;  Service: Orthopedics;  Laterality: Left;   TOTAL KNEE ARTHROPLASTY Right 12/15/2012   Procedure: TOTAL KNEE ARTHROPLASTY;  Surgeon: Ilean Mall, MD;  Location: [facility] OR;  Service: Orthopedics;  Laterality: Right;   TOTAL KNEE ARTHROPLASTY Left 06/18/2016   Procedure: TOTAL KNEE ARTHROPLASTY;  Surgeon: Wendolyn Hamburger, MD;  Location: [facility] OR;  Service: Orthopedics;  Laterality: Left;   UPPER GASTROINTESTINAL ENDOSCOPY  05/06/2019     Family History: Family History  Problem Relation Age of Onset   Diabetes Mother    Colon cancer Mother 26   Hypertension Father    CAD Father    Colon cancer Sister 58   Lung cancer Brother 1   Hypertension Brother    Hypertension Brother    Cancer Maternal Grandmother        Unsure what kind   Peripheral Artery Disease Paternal Grandfather    Esophageal cancer Neg Hx    Rectal cancer Neg Hx    Stomach cancer Neg Hx     Social History: Social History   Socioeconomic History   Marital status: Married    Spouse name: Not on file   Number of children: 2   Years of education: Not on file   Highest education level: Not on file  Occupational History   Not on file  Tobacco Use   Smoking status: Every Day    Current packs/day: 1.00    Average packs/day: 1 pack/day for 40.5 years (40.5 ttl pk-yrs)     Types: Cigarettes    Start date: 1985   Smokeless tobacco: Never   Tobacco comments:    10 cigarettes per day 05/02/2022 PAP  Vaping Use   Vaping status: Never Used  Substance and Sexual Activity   Alcohol use: Yes    Comment: 2-4 beers per night, advised to cut back    Drug use: Yes    Types: Marijuana    Comment: current, 5 joints/month    Sexual activity: Yes    Birth control/protection: Surgical  Other Topics Concern   Not on file  Social History Narrative   Not on file   Social Drivers of Health   Financial Resource Strain: Not on file  Food Insecurity: Not on file  Transportation Needs: Not on file  Physical Activity: Not on file  Stress: Not on file  Social Connections: Not on file    Allergies: Allergies  Allergen Reactions  Enalapril  Swelling    Caused lips to swell and bloating.   Statins     Tingling with several statins   Repatha  [Evolocumab ]     Stated she didn't tolerate it and switched to Praluent     Outpatient Meds: Current Outpatient Medications  Medication Sig Dispense Refill   Na Sulfate-K Sulfate-Mg Sulfate concentrate (SUPREP) 17.5-3.13-1.6 GM/177ML SOLN Take 1 kit (354 mLs total) by mouth once for 1 dose. 354 mL 0   albuterol  (VENTOLIN  HFA) 108 (90 Base) MCG/ACT inhaler Inhale 2 puffs into the lungs every 6 (six) hours as needed for wheezing or shortness of breath. 8 g 2   Alirocumab  (PRALUENT ) 150 MG/ML SOAJ Inject 1 mL (150 mg total) into the skin every 14 (fourteen) days. 6 mL 1   Ascorbic Acid  (VITAMIN C ) 1000 MG tablet Take 1,000 mg by mouth daily.     aspirin  81 MG tablet Take 81 mg by mouth every other day.      azelastine (ASTELIN) 0.1 % nasal spray Place 2 sprays into both nostrils daily.     bisoprolol -hydrochlorothiazide  (ZIAC ) 10-6.25 MG tablet Take 0.5 tablets by mouth daily. 45 tablet 2   buPROPion  (WELLBUTRIN  XL) 150 MG 24 hr tablet Take  1 tablet  2 x /day  for Mood, Focus & Concentration                             /                                                                      TAKE                                  BY                                           MOUTH 180 tablet 3   buPROPion  (WELLBUTRIN  XL) 150 MG 24 hr tablet Take 1 tablet (150 mg total) by mouth 2 (two) times daily. 60 tablet 0   cetirizine (ZYRTEC) 10 MG tablet Take 10 mg by mouth daily.     Cholecalciferol  (VITAMIN D3) 5000 UNITS TABS Take 5,000 Units by mouth daily.     clonazePAM  (KLONOPIN ) 0.5 MG tablet TAKE 1/2 TO 1 TABLET BY MOUTH ONCE  DAILY ONLY IF NEEDED FOR ANXIETY ATTACK AND LIMIT TO 5 DAYS PER WEEK TO AVOID ADDICTION AND DEMENTIA 50 tablet 0   clopidogrel  (PLAVIX ) 75 MG tablet Take  1 tablet  Daily  to Prevent Blood Clots 90 tablet 3   Cyanocobalamin  (VITAMIN B12 PO) Take 1,000 mcg by mouth daily.     EPINEPHrine  0.3 mg/0.3 mL IJ SOAJ injection Inject 0.3 mg into the muscle as needed.     fluticasone  (FLONASE ) 50 MCG/ACT nasal spray 1 Spray each nostril daily 48 g 1   hydrocortisone  (ANUSOL -HC) 2.5 % rectal cream Place 1 Application rectally 2 (two) times daily. Apply to external hemorrhoids 3 to 4  x / day (Patient not taking: Reported on 06/06/2023) 30  g 3   ipratropium-albuterol  (DUONEB) 0.5-2.5 (3) MG/3ML SOLN Take 3 mLs by nebulization every 4 (four) hours as needed. 90 mL 2   losartan  (COZAAR ) 100 MG tablet Take  1 tablet Daily for BP                                          /                      TAKE                          BY                                MOUTH 90 tablet 3   methylcellulose oral powder Take by mouth daily.      montelukast  (SINGULAIR ) 10 MG tablet TAKE 1 TABLET(10 MG) BY MOUTH AT BEDTIME 30 tablet 11   Multiple Vitamins-Minerals (MULTI COMPLETE PO) Take 1 capsule by mouth daily.     NIFEdipine  (PROCARDIA  XL/NIFEDICAL XL) 60 MG 24 hr tablet Take 1 tablet every night for BP 90 tablet 3   Omega-3 Fatty Acids (FISH OIL ) 1000 MG CAPS Take 1 capsule (1,000 mg total) by mouth 2 (two) times daily with a meal.  (Patient taking differently: Take 1 capsule by mouth daily.) 60 capsule 11   omeprazole  (PRILOSEC) 40 MG capsule Take  1 capsule  2 x /day to Prevent Heartburn & Indigestion                          /                                              TAKE                                             BY                                     MOUTH 180 capsule 3   promethazine -dextromethorphan (PROMETHAZINE -DM) 6.25-15 MG/5ML syrup Take 5 mLs by mouth 4 (four) times daily as needed for cough. 240 mL 0   Tiotropium Bromide -Olodaterol (STIOLTO RESPIMAT ) 2.5-2.5 MCG/ACT AERS INHALE 2 PUFFS INTO THE LUNGS DAILY 8 g 1   Zinc 50 MG TABS Take 25 mg by mouth daily.     No current facility-administered medications for this visit.      ___________________________________________________________________ Objective   Exam:  BP 130/72   Pulse 78   Ht 5' 5 (1.651 m)   Wt 164 lb (74.4 kg)   BMI 27.29 kg/m  Wt Readings from Last 3 Encounters:  01/01/24 164 lb (74.4 kg)  06/06/23 162 lb 9.6 oz (73.8 kg)  04/25/23 161 lb (73 kg)    General: Well-appearing Eyes: sclera anicteric, no redness ENT: oral mucosa moist without lesions, no cervical or supraclavicular lymphadenopathy CV: Regular, no JVD, no peripheral edema  Resp: clear to auscultation bilaterally, normal RR and effort noted GI: soft, mild RUQ tenderness reports it has been like that since her cholecystectomy), with active bowel sounds. No guarding or palpable organomegaly noted.  No distention Skin; warm and dry, no rash or jaundice noted Neuro: awake, alert and oriented x 3. Normal gross motor function and fluent speech      Encounter Diagnoses  Name Primary?   Family history of colon cancer Yes   Abdominal bloating     Assessment and Plan Assessment & Plan Bowel urgency and bloating Bowel urgency with firm stools managed with Colace. Persistent bloating unresponsive to probiotics. Considered dietary, medication, or stress-related  causes. - Discontinue probiotics. Smoking cessation  Family history of colon cancer Strong family history of early-onset colon cancer. Discussed genetic counseling and testing post-colonoscopy based on family history and findings. - Schedule colonoscopy with 5-day Plavix  hold  The benefits and risks of the planned procedure(s) were described in detail with the patient or (when appropriate) their health care proxy.  Risks were outlined as including, but not limited to, bleeding, infection, perforation, adverse medication reaction leading to cardiac or pulmonary decompensation, pancreatitis (if ERCP).  The limitation of incomplete mucosal visualization was also discussed.  No guarantees or warranties were given.  - Consult cardiologist regarding holding Plavix . - Refer to genetic counselor post-colonoscopy based on findings and family history.  She is overdue for cardiology follow-up.  I will route my note to them and I have asked her to contact them as well for an appointment    Thank you for the courtesy of this consult.  Please call me with any questions or concerns.  Kerby Pearson III  CC: Referring provider noted above

## 2024-01-01 NOTE — Telephone Encounter (Signed)
 [facility] Pre-operative Risk Assessment     Request for surgical clearance:     Endoscopy Procedure  What type of surgery is being performed?     Colonoscopy  When is this surgery scheduled?     02/06/24  What type of clearance is required ?   Pharmacy  Are there any medications that need to be held prior to surgery and how long? Plavix , 5 days  Practice name and name of physician performing surgery?      [facility]  What is your office phone and fax number?      Phone- [phone]  Fax- [phone]  Anesthesia type (None, local, MAC, general) ?       MAC   Please route your response to [place], [facility]

## 2024-01-01 NOTE — Telephone Encounter (Signed)
   Name: [name]  DOB: Aug 27, 1950  MRN: [id number]   Primary Cardiologist: Alexandria Angel, MD  Chart reviewed as part of pre-operative protocol coverage.   We are asked for plavix  hold prior to colonoscopy. Pt is on plavix  for prior renal artery occlusion and is managed by Dr. Cassondra Cliff. Please reach out to their office for hold.  I will route this recommendation to the requesting party via Epic fax function and remove from pre-op pool. Please call with questions.  Warren Haber Cybele Maule, PA 01/01/2024, 2:02 PM

## 2024-01-01 NOTE — Patient Instructions (Signed)
 You will be contacted by our office prior to your procedure for directions on holding your Plavix .  If you do not hear from our office 1 week prior to your scheduled procedure, please call [phone] to discuss.    You have been scheduled for a colonoscopy. Please follow written instructions given to you at your visit today.   If you use inhalers (even only as needed), please bring them with you on the day of your procedure.  DO NOT TAKE 7 DAYS PRIOR TO TEST- Trulicity (dulaglutide) Ozempic, Wegovy (semaglutide) Mounjaro (tirzepatide) Bydureon Bcise (exanatide extended release)  DO NOT TAKE 1 DAY PRIOR TO YOUR TEST Rybelsus (semaglutide) Adlyxin (lixisenatide) Victoza (liraglutide) Byetta (exanatide) ___________________________________________________________________________  _______________________________________________________  If your blood pressure at your visit was 140/90 or greater, please contact your primary care physician to follow up on this.  _______________________________________________________  If you are age 73 or older, your body mass index should be between 23-30. Your Body mass index is 27.29 kg/m. If this is out of the aforementioned range listed, please consider follow up with your Primary Care Provider.  If you are age 47 or younger, your body mass index should be between 19-25. Your Body mass index is 27.29 kg/m. If this is out of the aformentioned range listed, please consider follow up with your Primary Care Provider.   ________________________________________________________  The [facility] providers would like to encourage you to use MYCHART to communicate with providers for non-urgent requests or questions.  Due to long hold times on the telephone, sending your provider a message by [facility] may be a faster and more efficient way to get a response.  Please allow 48 business hours for a response.  Please remember that this is for non-urgent requests.   _______________________________________________________  Thank you for trusting me with your gastrointestinal care!    Dr. [facility]

## 2024-01-08 ENCOUNTER — Telehealth: Payer: Self-pay

## 2024-01-08 NOTE — Telephone Encounter (Signed)
 error

## 2024-01-08 NOTE — Telephone Encounter (Signed)
 Letter faxed to PCP Elsie Richards who manages Plavix .

## 2024-01-08 NOTE — Telephone Encounter (Signed)
 Error

## 2024-01-10 NOTE — Telephone Encounter (Signed)
 Called patient to find out who is managing her Plavix  as Dr Tonita passed away in 2023/07/31 and cardiology doesn't manage her Plavix . She states she has a new PCP, Dr Lequita Flor. Letter faxed to Dr Flor for Plavix  clearance.

## 2024-01-15 NOTE — Telephone Encounter (Signed)
 Clearance received from Dr Verdia for pt to hold Plavix  5 days prior to colonoscopy. Pt informed of Plavix  hold and states understanding. She has no further questions at this time.   Electronically signed:  Corean Amsterdam, NCMA

## 2024-01-28 ENCOUNTER — Telehealth: Payer: Self-pay | Admitting: Cardiology

## 2024-01-28 NOTE — Telephone Encounter (Signed)
 Pt requesting a provider switch to see Dr. Court. Please advise

## 2024-02-06 ENCOUNTER — Encounter: Payer: Self-pay | Admitting: Gastroenterology

## 2024-02-06 ENCOUNTER — Ambulatory Visit: Payer: Self-pay | Admitting: Gastroenterology

## 2024-02-06 VITALS — BP 153/69 | HR 69 | Temp 97.4°F | Resp 18 | Ht 65.0 in | Wt 164.0 lb

## 2024-02-06 DIAGNOSIS — Z800 Family history of malignant neoplasm of digestive organs: Secondary | ICD-10-CM | POA: Diagnosis not present

## 2024-02-06 DIAGNOSIS — K648 Other hemorrhoids: Secondary | ICD-10-CM

## 2024-02-06 DIAGNOSIS — D122 Benign neoplasm of ascending colon: Secondary | ICD-10-CM | POA: Diagnosis not present

## 2024-02-06 DIAGNOSIS — D120 Benign neoplasm of cecum: Secondary | ICD-10-CM | POA: Diagnosis not present

## 2024-02-06 DIAGNOSIS — D123 Benign neoplasm of transverse colon: Secondary | ICD-10-CM | POA: Diagnosis not present

## 2024-02-06 DIAGNOSIS — E119 Type 2 diabetes mellitus without complications: Secondary | ICD-10-CM | POA: Diagnosis not present

## 2024-02-06 DIAGNOSIS — I10 Essential (primary) hypertension: Secondary | ICD-10-CM | POA: Diagnosis not present

## 2024-02-06 DIAGNOSIS — Z1211 Encounter for screening for malignant neoplasm of colon: Secondary | ICD-10-CM

## 2024-02-06 DIAGNOSIS — Q438 Other specified congenital malformations of intestine: Secondary | ICD-10-CM | POA: Diagnosis not present

## 2024-02-06 DIAGNOSIS — J449 Chronic obstructive pulmonary disease, unspecified: Secondary | ICD-10-CM | POA: Diagnosis not present

## 2024-02-06 MED ORDER — SODIUM CHLORIDE 0.9 % IV SOLN: 500.0000 mL | Freq: Once | INTRAVENOUS | Status: DC

## 2024-02-06 NOTE — Patient Instructions (Signed)
 Handouts provided about hemorrhoids and polyps.  Resume previous diet.  Resume Plavix  (clopidogrel ) at prior dose.  Await pathology results.  Repeat colonoscopy is recommended for surveillance.  The colonoscopy date will be determined after pathology results from today's exam become available for review.     Refer to genetics counselor for strong history of colon cancer.  YOU HAD AN ENDOSCOPIC PROCEDURE TODAY AT THE [facility]:   Refer to the procedure report that was given to you for any specific questions about what was found during the examination.  If the procedure report does not answer your questions, please call your gastroenterologist to clarify.  If you requested that your care partner not be given the details of your procedure findings, then the procedure report has been included in a sealed envelope for you to review at your convenience later.  YOU SHOULD EXPECT: Some feelings of bloating in the abdomen. Passage of more gas than usual.  Walking can help get rid of the air that was put into your GI tract during the procedure and reduce the bloating. If you had a lower endoscopy (such as a colonoscopy or flexible sigmoidoscopy) you may notice spotting of blood in your stool or on the toilet paper. If you underwent a bowel prep for your procedure, you may not have a normal bowel movement for a few days.  Please Note:  You might notice some irritation and congestion in your nose or some drainage.  This is from the oxygen used during your procedure.  There is no need for concern and it should clear up in a day or so.  SYMPTOMS TO REPORT IMMEDIATELY:  Following lower endoscopy (colonoscopy or flexible sigmoidoscopy):  Excessive amounts of blood in the stool  Significant tenderness or worsening of abdominal pains  Swelling of the abdomen that is new, acute  Fever of 100F or higher  For urgent or emergent issues, a gastroenterologist can be reached at any hour by calling  [phone]. Do not use MyChart messaging for urgent concerns.    DIET:  We do recommend a small meal at first, but then you may proceed to your regular diet.  Drink plenty of fluids but you should avoid alcoholic beverages for 24 hours.  ACTIVITY:  You should plan to take it easy for the rest of today and you should NOT DRIVE or use heavy machinery until tomorrow (because of the sedation medicines used during the test).    FOLLOW UP: Our staff will call the number listed on your records the next business day following your procedure.  We will call around 7:15- 8:00 am to check on you and address any questions or concerns that you may have regarding the information given to you following your procedure. If we do not reach you, we will leave a message.     If any biopsies were taken you will be contacted by phone or by letter within the next 1-3 weeks.  Please call us  at [phone] if you have not heard about the biopsies in 3 weeks.    SIGNATURES/CONFIDENTIALITY: You and/or your care partner have signed paperwork which will be entered into your electronic medical record.  These signatures attest to the fact that that the information above on your After Visit Summary has been reviewed and is understood.  Full responsibility of the confidentiality of this discharge information lies with you and/or your care-partner.

## 2024-02-06 NOTE — Progress Notes (Signed)
 Sedate, gd SR, tolerated procedure well, VSS, report to RN

## 2024-02-06 NOTE — Op Note (Signed)
 [facility] Patient Name: [name] Procedure Date: 02/06/2024 12:44 PM MRN: [id number] Endoscopist: Victory L. Legrand , MD, [id number] Age: 73 Referring MD:  Date of Birth: 1950-12-14 Gender: Female Account #: [id number] Procedure:                Colonoscopy Indications:              Screening patient at increased risk: Family history                            of colorectal cancer in multiple 1st-degree                            relatives Medicines:                Monitored Anesthesia Care Procedure:                Pre-Anesthesia Assessment:                           - Prior to the procedure, a History and Physical                            was performed, and patient medications and                            allergies were reviewed. The patient's tolerance of                            previous anesthesia was also reviewed. The risks                            and benefits of the procedure and the sedation                            options and risks were discussed with the patient.                            All questions were answered, and informed consent                            was obtained. Prior Anticoagulants: The patient has                            taken Plavix  (clopidogrel ), last dose was 5 days                            prior to procedure. ASA Grade Assessment: III - A                            patient with severe systemic disease. After                            reviewing the risks and benefits, the patient was  deemed in satisfactory condition to undergo the                            procedure.                           After obtaining informed consent, the colonoscope                            was passed under direct vision. Throughout the                            procedure, the patient's blood pressure, pulse, and                            oxygen saturations were monitored continuously. The                             Olympus Scope SN [id number] was introduced through the                            anus and advanced to the the cecum, identified by                            appendiceal orifice and ileocecal valve. The                            colonoscopy was somewhat difficult due to a                            redundant colon. Successful completion of the                            procedure was aided by using manual pressure and                            straightening and shortening the scope to obtain                            bowel loop reduction. The patient tolerated the                            procedure well. The quality of the bowel                            preparation was good. The ileocecal valve,                            appendiceal orifice, and rectum were photographed. Scope In: 1:36:05 PM Scope Out: 1:56:01 PM Scope Withdrawal Time: 0 hours 10 minutes 21 seconds  Total Procedure Duration: 0 hours 19 minutes 56 seconds  Findings:                 The perianal and digital rectal examinations were  normal.                           Repeat examination of right colon under NBI                            performed.                           Three sessile polyps were found in the transverse                            colon, ascending colon and cecum. The polyps were                            diminutive in size. These polyps were removed with                            a cold snare. Resection and retrieval were complete.                           Internal hemorrhoids were found.                           The exam was otherwise without abnormality on                            direct and retroflexion views. Complications:            No immediate complications. Estimated Blood Loss:     Estimated blood loss was minimal. Impression:               - Three diminutive polyps in the transverse colon,                            in the ascending colon and in the  cecum, removed                            with a cold snare. Resected and retrieved.                           - Internal hemorrhoids.                           - The examination was otherwise normal on direct                            and retroflexion views. Recommendation:           - Patient has a contact number available for                            emergencies. The signs and symptoms of potential                            delayed complications were discussed with the  patient. Return to normal activities tomorrow.                            Written discharge instructions were provided to the                            patient.                           - Resume previous diet.                           - Resume Plavix  (clopidogrel ) at prior dose                            tomorrow.                           - Await pathology results.                           - Repeat colonoscopy is recommended for                            surveillance. The colonoscopy date will be                            determined after pathology results from today's                            exam become available for review.                           - Refer to a genetics counselor for strong family                            history of colon cancer. Jasani Dolney L. Legrand, MD 02/06/2024 2:00:31 PM This report has been signed electronically.

## 2024-02-06 NOTE — Progress Notes (Signed)
 Called to room to assist during endoscopic procedure.  Patient ID and intended procedure confirmed with present staff. Received instructions for my participation in the procedure from the performing physician.

## 2024-02-06 NOTE — Progress Notes (Signed)
 History and Physical:  This patient presents for endoscopic testing for: Encounter Diagnosis  Name Primary?   Family history of colon cancer Yes   Here today for screening colonoscopy Mother and sisters with CRC Clinical details in 01/01/24 office consult note Last colonoscopy 01/2014 [place])  Patient is otherwise without complaints or active issues today.   Past Medical History: Past Medical History:  Diagnosis Date   Allergy    enviromental   Anxiety    Arthritis    osteoarthritis   COPD (chronic obstructive pulmonary disease) (HCC)    mild   Diabetes mellitus without complication (HCC)    controlled by diet   Environmental allergies    GERD (gastroesophageal reflux disease)    Hepatitis    hepatitis A   History of colon polyps    Hyperlipidemia    Hypertension    IBS (irritable bowel syndrome)    Mixed hyperlipidemia 06/15/2013   PONV (postoperative nausea and vomiting)    second surgery no problems last surgery   Solitary pulmonary nodule 06/02/2019   Urgency of urination    Takes oxybutynin ; under control at this time     Past Surgical History: Past Surgical History:  Procedure Laterality Date   APPENDECTOMY     removed as child   BACK SURGERY     lumbar fusion   CATARACT EXTRACTION Left 2019   COLONOSCOPY     DILATION AND CURETTAGE OF UTERUS     LUMBAR FUSION  02/06/2012   L 4  L5   PARTIAL HYSTERECTOMY     POLYPECTOMY     TOTAL HIP ARTHROPLASTY  06/02/2012   Procedure: TOTAL HIP ARTHROPLASTY;  Surgeon: Dempsey JINNY Sensor, MD;  Location: [facility] OR;  Service: Orthopedics;  Laterality: Right;   TOTAL HIP ARTHROPLASTY Left 01/25/2016   TOTAL HIP ARTHROPLASTY Left 01/25/2016   Procedure: LEFT TOTAL HIP ARTHROPLASTY;  Surgeon: Dempsey Sensor, MD;  Location: [facility] OR;  Service: Orthopedics;  Laterality: Left;   TOTAL KNEE ARTHROPLASTY Right 12/15/2012   Procedure: TOTAL KNEE ARTHROPLASTY;  Surgeon: Dempsey JINNY Sensor, MD;  Location: [facility] OR;  Service: Orthopedics;  Laterality:  Right;   TOTAL KNEE ARTHROPLASTY Left 06/18/2016   Procedure: TOTAL KNEE ARTHROPLASTY;  Surgeon: Dempsey Sensor, MD;  Location: [facility] OR;  Service: Orthopedics;  Laterality: Left;   UPPER GASTROINTESTINAL ENDOSCOPY  05/06/2019    Allergies: Allergies  Allergen Reactions   Enalapril  Swelling    Caused lips to swell and bloating.   Repatha  [Evolocumab ]     Stated she didn't tolerate it and switched to Praluent    Statins Other (See Comments)    Tingling with several statins    Outpatient Meds: Current Outpatient Medications  Medication Sig Dispense Refill   albuterol  (VENTOLIN  HFA) 108 (90 Base) MCG/ACT inhaler Inhale 2 puffs into the lungs every 6 (six) hours as needed for wheezing or shortness of breath. 8 g 2   Ascorbic Acid  (VITAMIN C ) 1000 MG tablet Take 1,000 mg by mouth daily.     aspirin  81 MG tablet Take 81 mg by mouth every other day.      azelastine (ASTELIN) 0.1 % nasal spray Place 2 sprays into both nostrils daily.     bisoprolol -hydrochlorothiazide  (ZIAC ) 10-6.25 MG tablet Take 0.5 tablets by mouth daily. 45 tablet 2   buPROPion  (WELLBUTRIN  XL) 150 MG 24 hr tablet Take  1 tablet  2 x /day  for Mood, Focus & Concentration                             /  TAKE                                  BY                                           MOUTH 180 tablet 3   cetirizine (ZYRTEC) 10 MG tablet Take 10 mg by mouth daily.     Cholecalciferol  (VITAMIN D3) 5000 UNITS TABS Take 5,000 Units by mouth daily.     Cyanocobalamin  (VITAMIN B12 PO) Take 1,000 mcg by mouth daily.     fluticasone  (FLONASE ) 50 MCG/ACT nasal spray 1 Spray each nostril daily 48 g 1   losartan  (COZAAR ) 100 MG tablet Take  1 tablet Daily for BP                                          /                      TAKE                          BY                                MOUTH 90 tablet 3   montelukast  (SINGULAIR ) 10 MG tablet TAKE 1 TABLET(10 MG) BY MOUTH AT  BEDTIME 30 tablet 11   Multiple Vitamins-Minerals (MULTI COMPLETE PO) Take 1 capsule by mouth daily.     NIFEdipine  (PROCARDIA  XL/NIFEDICAL XL) 60 MG 24 hr tablet Take 1 tablet every night for BP 90 tablet 3   Omega-3 Fatty Acids (FISH OIL ) 1000 MG CAPS Take 1 capsule (1,000 mg total) by mouth 2 (two) times daily with a meal. (Patient taking differently: Take 1 capsule by mouth daily.) 60 capsule 11   omeprazole  (PRILOSEC) 40 MG capsule Take  1 capsule  2 x /day to Prevent Heartburn & Indigestion                          /                                              TAKE                                             BY                                     MOUTH 180 capsule 3   Tiotropium Bromide -Olodaterol (STIOLTO RESPIMAT ) 2.5-2.5 MCG/ACT AERS INHALE 2 PUFFS INTO THE LUNGS DAILY 8 g 1   Zinc 50 MG TABS Take 25 mg by mouth daily.     Alirocumab  (PRALUENT ) 150 MG/ML SOAJ Inject 1 mL (150 mg total) into the skin every  14 (fourteen) days. 6 mL 1   buPROPion  (WELLBUTRIN  XL) 150 MG 24 hr tablet Take 1 tablet (150 mg total) by mouth 2 (two) times daily. 60 tablet 0   clonazePAM  (KLONOPIN ) 0.5 MG tablet TAKE 1/2 TO 1 TABLET BY MOUTH ONCE  DAILY ONLY IF NEEDED FOR ANXIETY ATTACK AND LIMIT TO 5 DAYS PER WEEK TO AVOID ADDICTION AND DEMENTIA 50 tablet 0   clopidogrel  (PLAVIX ) 75 MG tablet Take  1 tablet  Daily  to Prevent Blood Clots 90 tablet 3   EPINEPHrine  0.3 mg/0.3 mL IJ SOAJ injection Inject 0.3 mg into the muscle as needed. (Patient not taking: Reported on 02/06/2024)     hydrocortisone  (ANUSOL -HC) 2.5 % rectal cream Place 1 Application rectally 2 (two) times daily. Apply to external hemorrhoids 3 to 4  x / day (Patient not taking: Reported on 02/06/2024) 30 g 3   ipratropium-albuterol  (DUONEB) 0.5-2.5 (3) MG/3ML SOLN Take 3 mLs by nebulization every 4 (four) hours as needed. 90 mL 2   methylcellulose oral powder Take by mouth daily.      promethazine -dextromethorphan (PROMETHAZINE -DM) 6.25-15 MG/5ML syrup  Take 5 mLs by mouth 4 (four) times daily as needed for cough. (Patient not taking: Reported on 02/06/2024) 240 mL 0   Current Facility-Administered Medications  Medication Dose Route Frequency Provider Last Rate Last Admin   0.9 %  sodium chloride  infusion  500 mL Intravenous Once Danis, Angelynn Lemus L III, MD          ___________________________________________________________________ Objective   Exam:  BP (!) 143/84   Pulse 71   Temp (!) 97.4 F (36.3 C)   Resp 11   Ht 5' 5 (1.651 m)   Wt 164 lb (74.4 kg)   SpO2 97%   BMI 27.29 kg/m   CV: regular , S1/S2 Resp: clear to auscultation bilaterally, normal RR and effort noted GI: soft, no tenderness, with active bowel sounds.   Assessment: Encounter Diagnosis  Name Primary?   Family history of colon cancer Yes     Plan: Colonoscopy   The benefits and risks of the planned procedure(s) were described in detail with the patient or (when appropriate) their health care proxy.  Risks were outlined as including, but not limited to, bleeding, infection, perforation, adverse medication reaction leading to cardiac or pulmonary decompensation, pancreatitis (if ERCP).  The limitation of incomplete mucosal visualization was also discussed.  No guarantees or warranties were given.  The patient is appropriate for an endoscopic procedure in the ambulatory setting.   - Victory Brand, MD

## 2024-02-07 ENCOUNTER — Telehealth: Payer: Self-pay

## 2024-02-07 NOTE — Telephone Encounter (Signed)
  Follow up Call-     02/06/2024   12:35 PM  Call back number  Post procedure Call Back phone  # [phone]  Permission to leave phone message Yes     Patient questions:  Do you have a fever, pain , or abdominal swelling? No. Pain Score  0 *  Have you tolerated food without any problems? Yes.    Have you been able to return to your normal activities? Yes.    Do you have any questions about your discharge instructions: Diet   No. Medications  No. Follow up visit  No.  Do you have questions or concerns about your Care? No.  Actions: * If pain score is 4 or above: No action needed, pain <4.

## 2024-02-10 ENCOUNTER — Other Ambulatory Visit: Payer: Self-pay

## 2024-02-10 DIAGNOSIS — Z800 Family history of malignant neoplasm of digestive organs: Secondary | ICD-10-CM

## 2024-02-11 ENCOUNTER — Other Ambulatory Visit: Payer: Self-pay | Admitting: Cardiology

## 2024-02-11 LAB — SURGICAL PATHOLOGY

## 2024-02-12 ENCOUNTER — Ambulatory Visit: Payer: Self-pay | Admitting: Gastroenterology

## 2024-02-12 DIAGNOSIS — R350 Frequency of micturition: Secondary | ICD-10-CM | POA: Diagnosis not present

## 2024-02-14 DIAGNOSIS — R351 Nocturia: Secondary | ICD-10-CM | POA: Diagnosis not present

## 2024-02-14 DIAGNOSIS — M65331 Trigger finger, right middle finger: Secondary | ICD-10-CM | POA: Diagnosis not present

## 2024-02-15 ENCOUNTER — Encounter: Payer: Self-pay | Admitting: Gastroenterology

## 2024-02-19 ENCOUNTER — Encounter: Payer: Self-pay | Admitting: Acute Care

## 2024-02-20 ENCOUNTER — Telehealth: Payer: Self-pay | Admitting: Emergency Medicine

## 2024-02-20 ENCOUNTER — Encounter: Payer: Self-pay | Admitting: Emergency Medicine

## 2024-02-20 ENCOUNTER — Ambulatory Visit: Admitting: Emergency Medicine

## 2024-02-20 ENCOUNTER — Ambulatory Visit: Payer: Self-pay | Admitting: Emergency Medicine

## 2024-02-20 VITALS — BP 177/79 | HR 66 | Temp 97.8°F | Ht 65.0 in | Wt 168.6 lb

## 2024-02-20 DIAGNOSIS — J301 Allergic rhinitis due to pollen: Secondary | ICD-10-CM | POA: Diagnosis not present

## 2024-02-20 DIAGNOSIS — J432 Centrilobular emphysema: Secondary | ICD-10-CM | POA: Diagnosis not present

## 2024-02-20 MED ORDER — PREDNISONE 10 MG PO TABS
ORAL_TABLET | ORAL | 0 refills | Status: AC
Start: 2024-02-20 — End: ?

## 2024-02-20 MED ORDER — AZITHROMYCIN 250 MG PO TABS: ORAL_TABLET | ORAL | 0 refills | Status: AC

## 2024-02-20 NOTE — Telephone Encounter (Signed)
 Please schedule the patient an office visit according to the AVS (one month) this is to be scheduled with Nurse Practitioner as Dr. Shelah stated in the AVS

## 2024-02-20 NOTE — Progress Notes (Signed)
 Subjective:    Patient ID: [name], female    DOB: November 11, 1950, 73 y.o.   MRN: [id number]  Shortness of Breath Pertinent negatives include no ear pain, fever, headaches, leg swelling, rash, rhinorrhea, sore throat, vomiting or wheezing.    ROV 05/02/2022 --follow-up visit for 73 year old woman with history of tobacco use.  I last saw her in June 2021.  She has a history of hypertension, diabetes, hyperlipidemia and GERD.  I see her for COPD and obstructive sleep apnea.  She is not on CPAP. Back on cigarettes for over a year, 10 cig a day.  Today she reports that she started to have increased nasal congestion and drainage, sneezing. She had increased dry cough and throat noise. She was treated w pred + azithro. Her cough improved, but sems to be bothering her again now that the meds are finished. She is on flonase , formerly on singulair  but not currently. On omeprazole  40 qd.  She is doing occasional [place]. She is on stiolto, rare albuterol  use.   Acute office visit 02/20/2024 --73 year old woman with a history of tobacco use and COPD, OSA not on CPAP.  Also with hypertension, diabetes, hyperlipidemia, GERD.  She is here today reporting that she is having very low energy - started several months ago. Then more recently she started to notice some exertional SOB, increased cough and congestion. She stopped her allergy shots this year. She is off zyrtec, started lorartadine 2 weeks ago, singulair . She has astelin NS but uses rarely. She remains on Stiolto.    Review of Systems  Constitutional:  Negative for fever and unexpected weight change.  HENT:  Negative for congestion, dental problem, ear pain, nosebleeds, postnasal drip, rhinorrhea, sinus pressure, sneezing, sore throat and trouble swallowing.   Eyes:  Negative for redness and itching.  Respiratory:  Positive for cough and shortness of breath. Negative for chest tightness and wheezing.   Cardiovascular:  Negative for palpitations and leg  swelling.  Gastrointestinal:  Negative for nausea and vomiting.  Genitourinary:  Negative for dysuria.  Musculoskeletal:  Negative for joint swelling.  Skin:  Negative for rash.  Neurological:  Negative for headaches.  Hematological:  Does not bruise/bleed easily.  Psychiatric/Behavioral:  Negative for dysphoric mood. The patient is not nervous/anxious.        Objective:   Physical Exam Vitals:   02/20/24 1024  BP: (!) 177/79  Pulse: 66  Temp: 97.8 F (36.6 C)  TempSrc: Temporal  SpO2: 95%  Weight: 168 lb 9.6 oz (76.5 kg)  Height: 5' 5 (1.651 m)   Gen: Pleasant, well-nourished, in no distress,  normal affect  ENT: No lesions,  mouth clear,  oropharynx clear, no postnasal drip  Neck: No JVD, no stridor  Lungs: No use of accessory muscles, clear B with a normal breath and also on a forced expiration  Cardiovascular: RRR, heart sounds normal, no murmur or gallops, no peripheral edema  Musculoskeletal: No deformities, no cyanosis or clubbing  Neuro: alert, non focal  Skin: Warm, no lesions or rashes     Assessment & Plan:  COPD GOLD A-B Difficult to tell whether this is an acute flare or more subacute.  Her lung exam is without wheezing, better than I would expect an acute flare.  I will treat her for an acute exacerbation now but low threshold to consider changing her maintenance BD regimen going forward if she does not get much response, continues to have symptoms.  Also note that her allergy shots  were discontinued and increased allergic component may be making her COPD harder to manage.   Please take prednisone  and azithromycin  as directed until completely gone Please continue your Stiolto 2 puffs once daily. Follow-up in our office in about 1 month.  At that time we will decide whether to change her Stiolto to an alternative to see if you get more benefit  Allergic rhinitis Continue your loratadine, Singulair , Astelin nasal spray as you have been taking them We  may decide to add Flonase  nasal spray at some point going forward.  We may also need to consider restarting allergy shots depending on the course.   Lamar Chris, MD, PhD 02/20/2024, 4:54 PM [facility] [phone] or if no answer [phone]

## 2024-02-20 NOTE — Telephone Encounter (Signed)
 PT needs a APT in one month with a [facility] practitioner according to AVS. Dr. Shelah is her doctor and only has OCT APT available. Is there a slot that we can give her that is closer to the one month mark.

## 2024-02-20 NOTE — Assessment & Plan Note (Signed)
 Difficult to tell whether this is an acute flare or more subacute.  Her lung exam is without wheezing, better than I would expect an acute flare.  I will treat her for an acute exacerbation now but low threshold to consider changing her maintenance BD regimen going forward if she does not get much response, continues to have symptoms.  Also note that her allergy shots were discontinued and increased allergic component may be making her COPD harder to manage.   Please take prednisone  and azithromycin  as directed until completely gone Please continue your Stiolto 2 puffs once daily. Follow-up in our office in about 1 month.  At that time we will decide whether to change her Stiolto to an alternative to see if you get more benefit

## 2024-02-20 NOTE — Patient Instructions (Addendum)
 Please take prednisone  and azithromycin  as directed until completely gone Please continue your Stiolto 2 puffs once daily. Continue your loratadine, Singulair , Astelin nasal spray as you have been taking them We may decide to add Flonase  nasal spray at some point going forward.  We may also need to consider restarting allergy shots depending on the course. Follow-up in our office in about 1 month.  At that time we will decide whether to change her Stiolto to an alternative to see if you get more benefit

## 2024-02-20 NOTE — Telephone Encounter (Signed)
 FYI Only or Action Required?: FYI only for provider.  Patient is followed in Pulmonology for COPD, last seen on 05/02/2022 by Shelah Lamar RAMAN, MD.  Called Nurse Triage reporting Shortness of Breath.  Symptoms began several weeks ago.  Interventions attempted: Maintenance inhaler.  Symptoms are: unchanged.  Triage Disposition: See HCP Within 4 Hours (Or PCP Triage)  Patient/caregiver understands and will follow disposition?: Yes      Copied from CRM #[id number]. Topic: Clinical - Red Word Triage >> Feb 20, 2024  8:49 AM Isabell A wrote: Red Word that prompted transfer to Nurse Triage: Experiencing SOB, changes in her breathing, gasp for breath (not a lot but noticed it more and more) Reason for Disposition  [1] Longstanding difficulty breathing (e.g., CHF, COPD, emphysema) AND [2] WORSE than normal  Answer Assessment - Initial Assessment Questions E2C2 Pulmonary Triage - Initial Assessment Questions Chief Complaint (e.g., cough, sob, wheezing, fever, chills, sweat or additional symptoms) *Go to specific symptom protocol after initial questions. SOB, wet cough mucus is stuck there, poor energy  How long have symptoms been present? 2 week  Have you tested for COVID or Flu? Note: If not, ask patient if a home test can be taken. If so, instruct patient to call back for positive results. No  MEDICINES:   Have you used any OTC meds to help with symptoms? No If yes, ask What medications? N/a  Have you used your inhalers/maintenance medication? Yes If yes, What medications? Stiolto - 1 puff daily Albuterol  PRN - has not used [facility] reviewed/reinforced albuterol  usage and SIG.    If inhaler, ask How many puffs and how often? Note: Review instructions on medication in the chart. See above  OXYGEN: Do you wear supplemental oxygen? No If yes, How many liters are you supposed to use? N/a  Do you monitor your oxygen levels? No If yes, What is your reading  (oxygen level) today? Checks PRN - currently 95 on RA  What is your usual oxygen saturation reading?  (Note: Pulmonary O2 sats should be 90% or greater) N/a      1. RESPIRATORY STATUS: Describe your breathing? (e.g., wheezing, shortness of breath, unable to speak, severe coughing)      See above 2. ONSET: When did this breathing problem begin?      See above 3. PATTERN Does the difficult breathing come and go, or has it been constant since it started?      intermittent 4. SEVERITY: How bad is your breathing? (e.g., mild, moderate, severe)      Mild-mod - endorses SOB at rest as well [facility] does not appreciate audible SOB/wheezing during call. Pt is speaking in full sentences.  5. RECURRENT SYMPTOM: Have you had difficulty breathing before? If Yes, ask: When was the last time? and What happened that time?      no 6. CARDIAC HISTORY: Do you have any history of heart disease? (e.g., heart attack, angina, bypass surgery, angioplasty)      denies 7. LUNG HISTORY: Do you have any history of lung disease?  (e.g., pulmonary embolus, asthma, emphysema)     COPD 8. CAUSE: What do you think is causing the breathing problem?      unknown 9. OTHER SYMPTOMS: Do you have any other symptoms? (e.g., chest pain, cough, dizziness, fever, runny nose)     Runny nose x 2 days - clear 10. O2 SATURATION MONITOR:  Do you use an oxygen saturation monitor (pulse oximeter) at home? If Yes, ask: What is your  reading (oxygen level) today? What is your usual oxygen saturation reading? (e.g., 95%)       N/a 11. PREGNANCY: Is there any chance you are pregnant? When was your last menstrual period?       N/a 12. TRAVEL: Have you traveled out of the country in the last month? (e.g., travel history, exposures)       N/a  Protocols used: Breathing Difficulty-A-AH

## 2024-02-20 NOTE — Telephone Encounter (Signed)
 Patient has appointment today with Dr. Byrum

## 2024-02-20 NOTE — Assessment & Plan Note (Signed)
 Continue your loratadine, Singulair , Astelin nasal spray as you have been taking them We may decide to add Flonase  nasal spray at some point going forward.  We may also need to consider restarting allergy shots depending on the course.

## 2024-03-02 ENCOUNTER — Other Ambulatory Visit: Payer: Self-pay | Admitting: Acute Care

## 2024-03-02 DIAGNOSIS — Z720 Tobacco use: Secondary | ICD-10-CM

## 2024-03-02 DIAGNOSIS — F17200 Nicotine dependence, unspecified, uncomplicated: Secondary | ICD-10-CM

## 2024-03-02 NOTE — Telephone Encounter (Signed)
 PT still needs apt.

## 2024-03-02 NOTE — Telephone Encounter (Signed)
 Called PT to inform that we needed to cancel her APT. She needs to see a NP.

## 2024-04-15 DIAGNOSIS — N390 Urinary tract infection, site not specified: Secondary | ICD-10-CM | POA: Diagnosis not present

## 2024-04-23 ENCOUNTER — Encounter: Payer: Self-pay | Admitting: Nurse Practitioner

## 2024-04-23 ENCOUNTER — Ambulatory Visit: Admitting: Nurse Practitioner

## 2024-04-23 VITALS — BP 130/62 | HR 66 | Temp 98.5°F | Ht 65.0 in | Wt 169.8 lb

## 2024-04-23 DIAGNOSIS — G4733 Obstructive sleep apnea (adult) (pediatric): Secondary | ICD-10-CM

## 2024-04-23 DIAGNOSIS — J432 Centrilobular emphysema: Secondary | ICD-10-CM | POA: Diagnosis not present

## 2024-04-23 DIAGNOSIS — R918 Other nonspecific abnormal finding of lung field: Secondary | ICD-10-CM

## 2024-04-23 NOTE — Assessment & Plan Note (Signed)
 LDCT from August 2024 with lung RADS 2. Has not had repeat scan. Will send a message to the [facility] program to get her scheduled

## 2024-04-23 NOTE — Assessment & Plan Note (Signed)
 Recent exacerbation resolved with z pack and prednisone . Moderate symptom burden at baseline. Breathing stable and lung exam unremarkable. Can consider addition of ICS in the future if she has another exacerbation or feels poorly controlled. Compensated on current regimen. Encouraged to work on graded exercises. Action plan in place. Trigger prevention reviewed. Patient Instructions  Continue Albuterol  inhaler 2 puffs or 3mL nebulizer every 6 hours as needed for shortness of breath or wheezing. Notify if symptoms persist despite rescue inhaler/neb use.  Continue Stiolto 2 puffs daily  Continue nasal sprays as scheduled Continue daily allergy pill  Given your symptoms and history, I am concerned that you may have sleep disordered breathing with sleep apnea. You will need a sleep study for further evaluation. Someone will contact you to schedule this.   We discussed how untreated sleep apnea puts an individual at risk for cardiac arrhthymias, pulm HTN, DM, stroke and increases their risk for daytime accidents. We also briefly reviewed treatment options including weight loss, side sleeping position, oral appliance, CPAP therapy or referral to ENT for possible surgical options  Use caution when driving and pull over if you become sleepy.  Follow up in 6 weeks with Katie Danon Lograsso,NP to go over sleep study results, or sooner, if needed. Friday PM virtual clinic preferred

## 2024-04-23 NOTE — Assessment & Plan Note (Signed)
 Hx of mild OSA. Intolerant of CPAP. She has ongoing symptoms with snoring, excessive daytime sleepiness, nocturia, restless sleep. BMI 28. History of HTN. Given this,  I am concerned she still has sleep disordered breathing with obstructive sleep apnea. She will need a repeat sleep study for further evaluation. Can consider treatment with hypoglossal nerve stimulator or oral appliance pending results   - discussed how weight can impact sleep and risk for sleep disordered breathing - discussed options to assist with weight loss: combination of diet modification, cardiovascular and strength training exercises   - had an extensive discussion regarding the adverse health consequences related to untreated sleep disordered breathing - specifically discussed the risks for hypertension, coronary artery disease, cardiac dysrhythmias, cerebrovascular disease, and diabetes - lifestyle modification discussed   - discussed how sleep disruption can increase risk of accidents, particularly when driving - safe driving practices were discussed

## 2024-04-23 NOTE — Patient Instructions (Signed)
 Continue Albuterol  inhaler 2 puffs or 3mL nebulizer every 6 hours as needed for shortness of breath or wheezing. Notify if symptoms persist despite rescue inhaler/neb use.  Continue Stiolto 2 puffs daily  Continue nasal sprays as scheduled Continue daily allergy pill  Given your symptoms and history, I am concerned that you may have sleep disordered breathing with sleep apnea. You will need a sleep study for further evaluation. Someone will contact you to schedule this.   We discussed how untreated sleep apnea puts an individual at risk for cardiac arrhthymias, pulm HTN, DM, stroke and increases their risk for daytime accidents. We also briefly reviewed treatment options including weight loss, side sleeping position, oral appliance, CPAP therapy or referral to ENT for possible surgical options  Use caution when driving and pull over if you become sleepy.  Follow up in 6 weeks with Sherri Janeece Blok,NP to go over sleep study results, or sooner, if needed. Friday PM virtual clinic preferred

## 2024-04-23 NOTE — Progress Notes (Unsigned)
 @Patient  ID: [name], female    DOB: [birth year], 73 y.o.   MRN: [id number]  Chief Complaint  Patient presents with   Follow-up    Good/bad days, sob, cough-foam,clear, no wheezing or fcs. Stopped smoking  6 mths. Ago.    Referring provider: Tonita Fallow, MD  HPI: 73 year old female, current smoker followed for emphysema and chronic cough. She is a patient of Dr. Lanny and last seen in office on 01/05/2022. Past medical history significant for HTN, CAD, GERD, HLD, depression.   TEST/EVENTS:  12/11/2017 PFTs: FVC 102, FEV1 95, ratio 79, TLC 125, DLCOunc 71. Normal spirometry with increased lung volumes and diffusion defect. 02/11/2018 HST: AHI 12.5/h    02/20/2024: OV with Dr. Shelah. Difficult to tell whether having acute flare or more subacute. Lung exam without wheeze. Treat with z pack and prednisone  for potential exacerbation. Low threshold to change maintenance if she does not get much response. Also note that her allergy shots were discontinued which could be contributing to poor control. Will continue Stiolto in interim.   04/23/2024: Today - follow up Discussed the use of AI scribe software for clinical note transcription with the patient, who gave verbal consent to proceed.  History of Present Illness Durinda A Solomon is a 73 year old female who presents for follow-up on her breathing and sleep issues.  Her breathing has some improvement following steroid and abx in August. She feels this resolved her flare up. She feels her breathing is at baseline and has not required her rescue inhaler. No significant cough or wheezing. No hemoptysis. Allergies are stable right now.   She reports a prior sleep study showed mild sleep apnea. She has tried CPAP therapy in the past but couldn't tolerate it. She experiences daytime fatigue and low energy. She sleeps alone but was previously informed by her husband that she snores. No drowsy driving, falling asleep while driving, morning  headaches, or sleep paralysis.  She experiences nocturia, waking up two to three times per night to urinate. She is currently on antibiotics for a bladder issue. Despite this, she manages to get seven to eight hours of sleep per night. No sleep aids. No excessive alcohol or caffeine intake.     Allergies  Allergen Reactions   Enalapril  Swelling    Caused lips to swell and bloating.   Repatha  [Evolocumab ]     Stated she didn't tolerate it and switched to Praluent    Statins Other (See Comments)    Tingling with several statins    Immunization History  Administered Date(s) Administered   INFLUENZA, HIGH DOSE SEASONAL PF 04/10/2017, 05/08/2019, 05/24/2020, 04/24/2021, 05/07/2022, 06/06/2023   Influenza-Unspecified 03/05/2018   PFIZER(Purple Top)SARS-COV-2 Vaccination 08/13/2019, 09/09/2019   PPD Test 09/21/2014   Pneumococcal Conjugate-13 10/22/2017   Pneumococcal Polysaccharide-23 02/07/2011, 01/26/2016   Tdap 02/07/2011    Past Medical History:  Diagnosis Date   Allergy    enviromental   Anxiety    Arthritis    osteoarthritis   COPD (chronic obstructive pulmonary disease) (HCC)    mild   Diabetes mellitus without complication (HCC)    controlled by diet   Environmental allergies    GERD (gastroesophageal reflux disease)    Hepatitis    hepatitis A   History of colon polyps    Hyperlipidemia    Hypertension    IBS (irritable bowel syndrome)    Mixed hyperlipidemia 06/15/2013   PONV (postoperative nausea and vomiting)    second surgery no problems last  surgery   Solitary pulmonary nodule 06/02/2019   Urgency of urination    Takes oxybutynin ; under control at this time    Tobacco History: Social History   Tobacco Use  Smoking Status Former   Current packs/day: 1.00   Average packs/day: 1 pack/day for 40.8 years (40.8 ttl pk-yrs)   Types: Cigarettes   Start date: 1985   Passive exposure: Past (quit 3 months ago)  Smokeless Tobacco Never  Tobacco Comments    10 cigarettes per day 05/02/2022 PAP   Counseling given: Not Answered Tobacco comments: 10 cigarettes per day 05/02/2022 PAP   Outpatient Medications Prior to Visit  Medication Sig Dispense Refill   albuterol  (VENTOLIN  HFA) 108 (90 Base) MCG/ACT inhaler Inhale 2 puffs into the lungs every 6 (six) hours as needed for wheezing or shortness of breath. 8 g 2   Alirocumab  (PRALUENT ) 150 MG/ML SOAJ Inject 1 mL (150 mg total) into the skin every 14 (fourteen) days. 6 mL 1   Ascorbic Acid  (VITAMIN C ) 1000 MG tablet Take 1,000 mg by mouth daily.     aspirin  81 MG tablet Take 81 mg by mouth every other day.      azelastine (ASTELIN) 0.1 % nasal spray Place 2 sprays into both nostrils daily.     azithromycin  (ZITHROMAX ) 250 MG tablet Take 2 on the first day, then 1 daily until completely gone 6 tablet 0   bisoprolol -hydrochlorothiazide  (ZIAC ) 10-6.25 MG tablet TAKE 1/2 TABLET BY MOUTH EVERY DAY 45 tablet 0   cetirizine (ZYRTEC) 10 MG tablet Take 10 mg by mouth daily.     Cholecalciferol  (VITAMIN D3) 5000 UNITS TABS Take 5,000 Units by mouth daily.     clonazePAM  (KLONOPIN ) 0.5 MG tablet TAKE 1/2 TO 1 TABLET BY MOUTH ONCE  DAILY ONLY IF NEEDED FOR ANXIETY ATTACK AND LIMIT TO 5 DAYS PER WEEK TO AVOID ADDICTION AND DEMENTIA 50 tablet 0   clopidogrel  (PLAVIX ) 75 MG tablet Take  1 tablet  Daily  to Prevent Blood Clots 90 tablet 3   Cyanocobalamin  (VITAMIN B12 PO) Take 1,000 mcg by mouth daily.     EPINEPHrine  0.3 mg/0.3 mL IJ SOAJ injection Inject 0.3 mg into the muscle as needed.     fluticasone  (FLONASE ) 50 MCG/ACT nasal spray 1 Spray each nostril daily 48 g 1   hydrocortisone  (ANUSOL -HC) 2.5 % rectal cream Place 1 Application rectally 2 (two) times daily. Apply to external hemorrhoids 3 to 4  x / day 30 g 3   losartan  (COZAAR ) 100 MG tablet Take  1 tablet Daily for BP                                          /                      TAKE                          BY                                MOUTH 90  tablet 3   methylcellulose oral powder Take by mouth daily.      montelukast  (SINGULAIR ) 10 MG tablet TAKE 1 TABLET(10 MG) BY MOUTH AT BEDTIME 30 tablet 11   Multiple  Vitamins-Minerals (MULTI COMPLETE PO) Take 1 capsule by mouth daily.     Omega-3 Fatty Acids (FISH OIL ) 1000 MG CAPS Take 1 capsule (1,000 mg total) by mouth 2 (two) times daily with a meal. 60 capsule 11   omeprazole  (PRILOSEC) 40 MG capsule Take  1 capsule  2 x /day to Prevent Heartburn & Indigestion                          /                                              TAKE                                             BY                                     MOUTH 180 capsule 3   Tiotropium Bromide -Olodaterol (STIOLTO RESPIMAT ) 2.5-2.5 MCG/ACT AERS INHALE 2 PUFFS INTO THE LUNGS DAILY 8 g 1   Zinc 50 MG TABS Take 25 mg by mouth daily.     buPROPion  (WELLBUTRIN  XL) 150 MG 24 hr tablet Take  1 tablet  2 x /day  for Mood, Focus & Concentration                             /                                                                     TAKE                                  BY                                           MOUTH (Patient not taking: Reported on 04/23/2024) 180 tablet 3   buPROPion  (WELLBUTRIN  XL) 150 MG 24 hr tablet Take 1 tablet (150 mg total) by mouth 2 (two) times daily. 60 tablet 0   ipratropium-albuterol  (DUONEB) 0.5-2.5 (3) MG/3ML SOLN Take 3 mLs by nebulization every 4 (four) hours as needed. (Patient not taking: Reported on 04/23/2024) 90 mL 2   NIFEdipine  (PROCARDIA  XL/NIFEDICAL XL) 60 MG 24 hr tablet Take 1 tablet every night for BP (Patient not taking: Reported on 04/23/2024) 90 tablet 3   predniSONE  (DELTASONE ) 10 MG tablet Take 40mg  daily for 3 days, then 30mg  daily for 3 days, then 20mg  daily for 3 days, then 10mg  daily for 3 days, then stop (Patient not taking: Reported on 04/23/2024) 30 tablet 0   promethazine -dextromethorphan (PROMETHAZINE -DM) 6.25-15 MG/5ML syrup Take 5 mLs by mouth 4 (four) times daily as needed  for  cough. (Patient not taking: Reported on 04/23/2024) 240 mL 0   No facility-administered medications prior to visit.     Review of Systems: as above    Physical Exam:  BP 130/62 (BP Location: Right Arm, Patient Position: Sitting, Cuff Size: Normal)   Pulse 66   Temp 98.5 F (36.9 C) (Oral)   Ht 5' 5 (1.651 m)   Wt 169 lb 12.8 oz (77 kg)   SpO2 94%   BMI 28.26 kg/m   GEN: Pleasant, interactive, well-appearing; in no acute distress. HEENT:  Normocephalic and atraumatic. PERRLA. Sclera white. Nasal turbinates pink, moist and patent bilaterally. No rhinorrhea present. Oropharynx pink and moist, without exudate or edema. No lesions, ulcerations, or postnasal drip. Mallampati III NECK:  Supple w/ fair ROM. No JVD present. Thyroid  symmetrical with no goiter or nodules palpated. No lymphadenopathy.   CV: RRR, no m/r/g, no peripheral edema. Pulses intact, +2 bilaterally.  PULMONARY:  Unlabored, regular breathing. Clear bilaterally A&P w/o wheezes/rales/rhonchi. No accessory muscle use. No dullness to percussion. GI: BS present and normoactive. Soft, non-tender to palpation.  MSK: No erythema, warmth or tenderness. Cap refil <2 sec all extrem.  Neuro: A/Ox3. No focal deficits noted.   Skin: Warm, no lesions or rashe Psych: Normal affect and behavior. Judgement and thought content appropriate.     Lab Results:  CBC    Component Value Date/Time   WBC 5.3 06/06/2023 1048   RBC 5.09 06/06/2023 1048   HGB 13.4 06/06/2023 1048   HCT 41.3 06/06/2023 1048   PLT 301 06/06/2023 1048   MCV 81.1 06/06/2023 1048   MCH 26.3 (L) 06/06/2023 1048   MCHC 32.4 06/06/2023 1048   RDW 14.4 06/06/2023 1048   LYMPHSABS 2,610 11/20/2022 1433   MONOABS 496 12/18/2016 1225   EOSABS 69 06/06/2023 1048   BASOSABS 42 06/06/2023 1048    BMET    Component Value Date/Time   NA 139 06/06/2023 1048   K 4.3 06/06/2023 1048   CL 102 06/06/2023 1048   CO2 29 06/06/2023 1048   GLUCOSE 87 06/06/2023  1048   BUN 18 06/06/2023 1048   CREATININE 0.79 06/06/2023 1048   CALCIUM  9.4 06/06/2023 1048   GFRNONAA 57 (L) 01/07/2023 2155   GFRNONAA 76 09/02/2020 1027   GFRAA 89 09/02/2020 1027    BNP No results found for: BNP   Imaging:  No results found.  Administration History     None          Latest Ref Rng & Units 12/11/2017   10:59 AM  PFT Results  FVC-Pre L 3.30   FVC-Predicted Pre % 102   FVC-Post L 3.12   FVC-Predicted Post % 97   Pre FEV1/FVC % % 71   Post FEV1/FCV % % 79   FEV1-Pre L 2.34   FEV1-Predicted Pre % 95   FEV1-Post L 2.45   DLCO uncorrected ml/min/mmHg 18.39   DLCO UNC% % 71   DLVA Predicted % 74   TLC L 6.50   TLC % Predicted % 125   RV % Predicted % 127     No results found for: NITRICOXIDE      Assessment & Plan:   Centrilobular emphysema (HCC) Recent exacerbation resolved with z pack and prednisone . Moderate symptom burden at baseline. Breathing stable and lung exam unremarkable. Can consider addition of ICS in the future if she has another exacerbation or feels poorly controlled. Compensated on current regimen. Encouraged to work on graded exercises. Action plan in  place. Trigger prevention reviewed. Patient Instructions  Continue Albuterol  inhaler 2 puffs or 3mL nebulizer every 6 hours as needed for shortness of breath or wheezing. Notify if symptoms persist despite rescue inhaler/neb use.  Continue Stiolto 2 puffs daily  Continue nasal sprays as scheduled Continue daily allergy pill  Given your symptoms and history, I am concerned that you may have sleep disordered breathing with sleep apnea. You will need a sleep study for further evaluation. Someone will contact you to schedule this.   We discussed how untreated sleep apnea puts an individual at risk for cardiac arrhthymias, pulm HTN, DM, stroke and increases their risk for daytime accidents. We also briefly reviewed treatment options including weight loss, side sleeping  position, oral appliance, CPAP therapy or referral to ENT for possible surgical options  Use caution when driving and pull over if you become sleepy.  Follow up in 6 weeks with Katie Tondra Reierson,NP to go over sleep study results, or sooner, if needed. Friday PM virtual clinic preferred       OSA (obstructive sleep apnea) Hx of mild OSA. Intolerant of CPAP. She has ongoing symptoms with snoring, excessive daytime sleepiness, nocturia, restless sleep. BMI 28. History of HTN. Given this,  I am concerned she still has sleep disordered breathing with obstructive sleep apnea. She will need a repeat sleep study for further evaluation. Can consider treatment with hypoglossal nerve stimulator or oral appliance pending results   - discussed how weight can impact sleep and risk for sleep disordered breathing - discussed options to assist with weight loss: combination of diet modification, cardiovascular and strength training exercises   - had an extensive discussion regarding the adverse health consequences related to untreated sleep disordered breathing - specifically discussed the risks for hypertension, coronary artery disease, cardiac dysrhythmias, cerebrovascular disease, and diabetes - lifestyle modification discussed   - discussed how sleep disruption can increase risk of accidents, particularly when driving - safe driving practices were discussed   Lung nodules LDCT from August 2024 with lung RADS 2. Has not had repeat scan. Will send a message to the [facility] program to get her scheduled    I spent 35 minutes of dedicated to the care of this patient on the date of this encounter to include pre-visit review of records, face-to-face time with the patient discussing conditions above, post visit ordering of testing, clinical documentation with the electronic health record, making appropriate referrals as documented, and communicating necessary findings to members of the patients care team.  Comer LULLA Rouleau, NP 04/23/2024  Pt aware and understands NP's role.

## 2024-04-24 ENCOUNTER — Encounter: Payer: Self-pay | Admitting: Nurse Practitioner

## 2024-04-24 ENCOUNTER — Telehealth: Payer: Self-pay | Admitting: Nurse Practitioner

## 2024-04-24 ENCOUNTER — Ambulatory Visit: Admitting: Emergency Medicine

## 2024-04-24 NOTE — Telephone Encounter (Signed)
 In error

## 2024-05-04 ENCOUNTER — Ambulatory Visit
Admission: RE | Admit: 2024-05-04 | Discharge: 2024-05-04 | Disposition: A | Discharge status: Home / Self Care | Source: Ambulatory Visit | Attending: Acute Care | Admitting: Acute Care

## 2024-05-04 DIAGNOSIS — F17200 Nicotine dependence, unspecified, uncomplicated: Secondary | ICD-10-CM

## 2024-05-04 DIAGNOSIS — Z87891 Personal history of nicotine dependence: Secondary | ICD-10-CM | POA: Diagnosis not present

## 2024-05-04 DIAGNOSIS — Z720 Tobacco use: Secondary | ICD-10-CM

## 2024-05-05 DIAGNOSIS — I2510 Atherosclerotic heart disease of native coronary artery without angina pectoris: Secondary | ICD-10-CM | POA: Diagnosis not present

## 2024-05-05 DIAGNOSIS — E782 Mixed hyperlipidemia: Secondary | ICD-10-CM | POA: Diagnosis not present

## 2024-05-05 DIAGNOSIS — J432 Centrilobular emphysema: Secondary | ICD-10-CM | POA: Diagnosis not present

## 2024-05-05 DIAGNOSIS — F17200 Nicotine dependence, unspecified, uncomplicated: Secondary | ICD-10-CM | POA: Diagnosis not present

## 2024-05-05 DIAGNOSIS — J410 Simple chronic bronchitis: Secondary | ICD-10-CM | POA: Diagnosis not present

## 2024-05-05 DIAGNOSIS — I10 Essential (primary) hypertension: Secondary | ICD-10-CM | POA: Diagnosis not present

## 2024-05-05 DIAGNOSIS — R5383 Other fatigue: Secondary | ICD-10-CM | POA: Diagnosis not present

## 2024-05-07 ENCOUNTER — Other Ambulatory Visit: Payer: Self-pay

## 2024-05-07 DIAGNOSIS — Z122 Encounter for screening for malignant neoplasm of respiratory organs: Secondary | ICD-10-CM

## 2024-05-07 DIAGNOSIS — Z87891 Personal history of nicotine dependence: Secondary | ICD-10-CM

## 2024-05-12 ENCOUNTER — Other Ambulatory Visit

## 2024-05-12 ENCOUNTER — Encounter

## 2024-05-12 DIAGNOSIS — I10 Essential (primary) hypertension: Secondary | ICD-10-CM | POA: Diagnosis not present

## 2024-05-12 DIAGNOSIS — F17200 Nicotine dependence, unspecified, uncomplicated: Secondary | ICD-10-CM | POA: Diagnosis not present

## 2024-05-12 DIAGNOSIS — J410 Simple chronic bronchitis: Secondary | ICD-10-CM | POA: Diagnosis not present

## 2024-05-12 DIAGNOSIS — Z23 Encounter for immunization: Secondary | ICD-10-CM | POA: Diagnosis not present

## 2024-05-12 DIAGNOSIS — E782 Mixed hyperlipidemia: Secondary | ICD-10-CM | POA: Diagnosis not present

## 2024-05-12 DIAGNOSIS — I2510 Atherosclerotic heart disease of native coronary artery without angina pectoris: Secondary | ICD-10-CM | POA: Diagnosis not present

## 2024-05-12 DIAGNOSIS — R5383 Other fatigue: Secondary | ICD-10-CM | POA: Diagnosis not present

## 2024-05-12 DIAGNOSIS — J432 Centrilobular emphysema: Secondary | ICD-10-CM | POA: Diagnosis not present

## 2024-06-04 ENCOUNTER — Telehealth: Payer: Self-pay | Admitting: Nurse Practitioner

## 2024-06-04 NOTE — Telephone Encounter (Signed)
 Pt is scheduled for virtual ov with Katie tomorrow 06/05/24  She never had HST- checked with [facility] and confirmed study never done  Needs to cancel appt to review results  Called pt and had to [facility]

## 2024-06-05 ENCOUNTER — Ambulatory Visit: Admitting: Nurse Practitioner

## 2024-06-05 DIAGNOSIS — M65331 Trigger finger, right middle finger: Secondary | ICD-10-CM | POA: Diagnosis not present

## 2024-06-05 DIAGNOSIS — M79641 Pain in right hand: Secondary | ICD-10-CM | POA: Diagnosis not present

## 2024-06-05 NOTE — Telephone Encounter (Signed)
 Patient will call back at the first of the year to get hst done and follow up appointment.

## 2024-06-17 DIAGNOSIS — R196 Halitosis: Secondary | ICD-10-CM | POA: Diagnosis not present

## 2024-06-17 DIAGNOSIS — R042 Hemoptysis: Secondary | ICD-10-CM | POA: Diagnosis not present

## 2024-06-19 ENCOUNTER — Encounter: Payer: Medicare Other | Admitting: Internal Medicine

## 2024-07-14 ENCOUNTER — Other Ambulatory Visit: Payer: Self-pay | Admitting: Nurse Practitioner

## 2024-07-14 ENCOUNTER — Other Ambulatory Visit: Payer: Self-pay | Admitting: Cardiology

## 2024-07-14 DIAGNOSIS — I2510 Atherosclerotic heart disease of native coronary artery without angina pectoris: Secondary | ICD-10-CM

## 2024-07-14 DIAGNOSIS — J432 Centrilobular emphysema: Secondary | ICD-10-CM

## 2024-07-14 DIAGNOSIS — R0609 Other forms of dyspnea: Secondary | ICD-10-CM

## 2024-07-14 DIAGNOSIS — E782 Mixed hyperlipidemia: Secondary | ICD-10-CM

## 2024-07-30 ENCOUNTER — Other Ambulatory Visit (HOSPITAL_COMMUNITY): Payer: Self-pay

## 2024-07-30 ENCOUNTER — Telehealth: Payer: Self-pay

## 2024-07-30 NOTE — Telephone Encounter (Signed)
 Copied from CRM [id number]. Topic: Clinical - Prescription Issue >> Jul 27, 2024 10:36 AM Benton KIDD wrote: Reason for CRM: patient is calling because she went to get refill and it doubled in cost patient is requesting a prescription that's affordable   Tiotropium Bromide -Olodaterol (STIOLTO RESPIMAT ) 2.5-2.5 MCG/ACT AERS   [id number] [address], [place] - [address] AT [facility] [address] [place] [address] Phone: [phone] Fax: [phone] Hours: Open 24 hours >> Jul 29, 2024 10:31 AM Isabell A wrote: Patient spoke with insurance for an alternative - states she can get IPRAPROPIUM covered.  >> Jul 29, 2024 10:19 AM Russell PARAS wrote: Pt is contacting clinic due to not receiving call back from message on Monday 1/12. She reports she picked up her refill for Stiolto and the copay is now $100, which she cannot afford.  Requesting if there are alternative medications that could be called in that would be more affordable. Did advise to contact insurance to get information on medications in their formulary that would be cheaper  CB#  [phone]  Tammy, any recommendations?

## 2024-07-30 NOTE — Telephone Encounter (Signed)
 Can we run a test claim on stiolto inahler for patient or see what is the most cost affordable inhaler

## 2024-07-30 NOTE — Telephone Encounter (Signed)
 Can we send to pharmacy team or check with her pharmacy to see if this is bc she has a deductible or will the be the monthly price going forward

## 2024-08-05 NOTE — Telephone Encounter (Signed)
 Copied from CRM #[id number]. Topic: Clinical - Prescription Issue >> Aug 05, 2024 10:06 AM Dedra NOVAK wrote: Reason for CRM: Patient returning call for [facility] regarding her medication.  Already being handled, see other notes.

## 2024-08-05 NOTE — Telephone Encounter (Signed)
 Copied from CRM [id number]. Topic: Clinical - Prescription Issue >> Aug 04, 2024  4:49 PM Rilla B wrote: Reason for CRM: Pt is calling stating no one is returning her calls.  States she has left several message and has not recieved call back.    Contacted [employer] (3) no answer... Patient upset... thanked me and hung up.  Please call patient @ [phone]   Called and spoke to pt. Pt is very upset. Per pt, insurance states that ipratropium is covered, however this is nebulized medication. Can we see if any other inhalers are covered by insurance to replace Stiolto?

## 2024-08-05 NOTE — Telephone Encounter (Signed)
 Pt is very upset and states that she has been trying for a week to get something called in. Pt called and spoke to insurance company who states that Stiolto is not covered, however DuoNeb is.   I informed pt that the Stiolto is an inhaler, and DuoNeb is a nebulizer solution. I advised pt that we would send a message to our pharmacy team to see if another inhaler is covered. However, she requested that in the meantime duoneb get called in to her pharmacy as she has been waiting a week to get this resolved.   Routing to DOD as pt is very adamant that duoneb get called in for her today while we wait on prior authorization team to advise on Stiolto replacement.  Confirmed with pt that we have her correct pharmacy listed.

## 2024-08-07 ENCOUNTER — Other Ambulatory Visit (HOSPITAL_COMMUNITY): Payer: Self-pay

## 2024-08-11 NOTE — Telephone Encounter (Signed)
 Called and spoke to pt. Advised of pharmacy recommendations. Pt verbalized understanding
